# Patient Record
Sex: Female | Born: 1955
Health system: Southern US, Community
[De-identification: ages and names within clinical notes are randomized; demographics above are authoritative.]

## PROBLEM LIST (undated history)

## (undated) DIAGNOSIS — M4712 Other spondylosis with myelopathy, cervical region: Secondary | ICD-10-CM

## (undated) DIAGNOSIS — M170 Bilateral primary osteoarthritis of knee: Secondary | ICD-10-CM

## (undated) DIAGNOSIS — R519 Headache, unspecified: Secondary | ICD-10-CM

## (undated) DIAGNOSIS — G3184 Mild cognitive impairment, so stated: Secondary | ICD-10-CM

## (undated) DIAGNOSIS — R413 Other amnesia: Secondary | ICD-10-CM

## (undated) DIAGNOSIS — R200 Anesthesia of skin: Secondary | ICD-10-CM

## (undated) DIAGNOSIS — G629 Polyneuropathy, unspecified: Secondary | ICD-10-CM

## (undated) DIAGNOSIS — F332 Major depressive disorder, recurrent severe without psychotic features: Secondary | ICD-10-CM

## (undated) DIAGNOSIS — G379 Demyelinating disease of central nervous system, unspecified: Secondary | ICD-10-CM

## (undated) DIAGNOSIS — G959 Disease of spinal cord, unspecified: Secondary | ICD-10-CM

## (undated) DIAGNOSIS — M79601 Pain in right arm: Secondary | ICD-10-CM

## (undated) DIAGNOSIS — R209 Unspecified disturbances of skin sensation: Secondary | ICD-10-CM

## (undated) DIAGNOSIS — I10 Essential (primary) hypertension: Secondary | ICD-10-CM

## (undated) DIAGNOSIS — G8929 Other chronic pain: Secondary | ICD-10-CM

## (undated) DIAGNOSIS — J302 Other seasonal allergic rhinitis: Secondary | ICD-10-CM

## (undated) DIAGNOSIS — C9590 Leukemia, unspecified not having achieved remission: Secondary | ICD-10-CM

## (undated) DIAGNOSIS — I82409 Acute embolism and thrombosis of unspecified deep veins of unspecified lower extremity: Secondary | ICD-10-CM

## (undated) DIAGNOSIS — R51 Headache: Secondary | ICD-10-CM

## (undated) DIAGNOSIS — I2699 Other pulmonary embolism without acute cor pulmonale: Secondary | ICD-10-CM

## (undated) DIAGNOSIS — R42 Dizziness and giddiness: Secondary | ICD-10-CM

## (undated) DIAGNOSIS — M25562 Pain in left knee: Principal | ICD-10-CM

## (undated) DIAGNOSIS — M545 Low back pain, unspecified: Principal | ICD-10-CM

## (undated) DIAGNOSIS — Z1231 Encounter for screening mammogram for malignant neoplasm of breast: Secondary | ICD-10-CM

## (undated) DIAGNOSIS — R0789 Other chest pain: Principal | ICD-10-CM

## (undated) DIAGNOSIS — Z13820 Encounter for screening for osteoporosis: Secondary | ICD-10-CM

## (undated) HISTORY — DX: Other chronic pain: G89.29

## (undated) HISTORY — DX: Headache, unspecified: R51.9

## (undated) HISTORY — PX: OTHER SURGICAL HISTORY: SHX169

## (undated) HISTORY — DX: Other seasonal allergic rhinitis: J30.2

## (undated) HISTORY — DX: Leukemia, unspecified not having achieved remission: C95.90

## (undated) HISTORY — PX: TUBAL LIGATION: SHX77

## (undated) HISTORY — DX: Acute embolism and thrombosis of unspecified deep veins of unspecified lower extremity: I82.409

## (undated) HISTORY — DX: Other pulmonary embolism without acute cor pulmonale: I26.99

## (undated) HISTORY — DX: Headache: R51

## (undated) MED ORDER — FLUOXETINE 40 MG CAP
40 mg | ORAL_CAPSULE | Freq: Every day | ORAL | Status: DC
Start: ? — End: 2013-12-22

## (undated) MED ORDER — FLUOXETINE 40 MG CAP
40 mg | ORAL_CAPSULE | Freq: Every day | ORAL | Status: DC
Start: ? — End: 2012-08-14

## (undated) MED ORDER — LORAZEPAM 0.5 MG TAB
0.5 mg | ORAL_TABLET | Freq: Two times a day (BID) | ORAL | Status: DC | PRN
Start: ? — End: 2013-12-22

## (undated) MED ORDER — AMLODIPINE 5 MG TAB
5 mg | ORAL_TABLET | ORAL | Status: DC
Start: ? — End: 2013-12-22

## (undated) MED ORDER — LORAZEPAM 0.5 MG TAB
0.5 mg | ORAL_TABLET | Freq: Two times a day (BID) | ORAL | Status: DC
Start: ? — End: 2012-08-14

---

## 1998-04-20 ENCOUNTER — Other Ambulatory Visit: Admission: RE | Admit: 1998-04-20 | Discharge: 1998-04-20 | Payer: Self-pay | Admitting: Internal Medicine

## 2000-02-25 ENCOUNTER — Other Ambulatory Visit: Admission: RE | Admit: 2000-02-25 | Discharge: 2000-02-25 | Payer: Self-pay | Admitting: Internal Medicine

## 2000-03-04 ENCOUNTER — Ambulatory Visit (HOSPITAL_COMMUNITY): Admission: RE | Admit: 2000-03-04 | Discharge: 2000-03-04 | Payer: Self-pay | Admitting: Internal Medicine

## 2000-03-04 ENCOUNTER — Encounter: Payer: Self-pay | Admitting: Internal Medicine

## 2000-11-14 ENCOUNTER — Encounter: Payer: Self-pay | Admitting: Internal Medicine

## 2000-11-14 ENCOUNTER — Emergency Department (HOSPITAL_COMMUNITY): Admission: EM | Admit: 2000-11-14 | Discharge: 2000-11-14 | Payer: Self-pay | Admitting: Internal Medicine

## 2000-11-26 ENCOUNTER — Encounter: Payer: Self-pay | Admitting: Internal Medicine

## 2000-11-26 ENCOUNTER — Encounter: Admission: RE | Admit: 2000-11-26 | Discharge: 2000-11-26 | Payer: Self-pay | Admitting: Internal Medicine

## 2001-04-12 ENCOUNTER — Encounter: Payer: Self-pay | Admitting: Emergency Medicine

## 2001-04-12 ENCOUNTER — Emergency Department (HOSPITAL_COMMUNITY): Admission: EM | Admit: 2001-04-12 | Discharge: 2001-04-12 | Payer: Self-pay | Admitting: *Deleted

## 2001-04-21 ENCOUNTER — Ambulatory Visit (HOSPITAL_BASED_OUTPATIENT_CLINIC_OR_DEPARTMENT_OTHER): Admission: RE | Admit: 2001-04-21 | Discharge: 2001-04-21 | Payer: Self-pay | Admitting: Orthopedic Surgery

## 2001-11-06 ENCOUNTER — Emergency Department (HOSPITAL_COMMUNITY): Admission: EM | Admit: 2001-11-06 | Discharge: 2001-11-06 | Payer: Self-pay | Admitting: Emergency Medicine

## 2002-11-24 ENCOUNTER — Emergency Department (HOSPITAL_COMMUNITY): Admission: EM | Admit: 2002-11-24 | Discharge: 2002-11-24 | Payer: Self-pay | Admitting: Emergency Medicine

## 2002-11-24 ENCOUNTER — Encounter: Payer: Self-pay | Admitting: Emergency Medicine

## 2004-04-17 ENCOUNTER — Emergency Department (HOSPITAL_COMMUNITY): Admission: EM | Admit: 2004-04-17 | Discharge: 2004-04-17 | Payer: Self-pay | Admitting: Emergency Medicine

## 2004-05-28 ENCOUNTER — Emergency Department (HOSPITAL_COMMUNITY): Admission: EM | Admit: 2004-05-28 | Discharge: 2004-05-28 | Payer: Self-pay | Admitting: Emergency Medicine

## 2005-03-17 ENCOUNTER — Ambulatory Visit (HOSPITAL_COMMUNITY): Admission: RE | Admit: 2005-03-17 | Discharge: 2005-03-17 | Payer: Self-pay | Admitting: Obstetrics

## 2007-07-03 ENCOUNTER — Emergency Department (HOSPITAL_COMMUNITY): Admission: EM | Admit: 2007-07-03 | Discharge: 2007-07-03 | Payer: Self-pay | Admitting: Emergency Medicine

## 2007-07-04 ENCOUNTER — Inpatient Hospital Stay (HOSPITAL_COMMUNITY): Admission: EM | Admit: 2007-07-04 | Discharge: 2007-07-09 | Payer: Self-pay | Admitting: Emergency Medicine

## 2007-07-07 ENCOUNTER — Ambulatory Visit: Payer: Self-pay | Admitting: Surgery

## 2007-08-04 ENCOUNTER — Emergency Department (HOSPITAL_COMMUNITY): Admission: EM | Admit: 2007-08-04 | Discharge: 2007-08-04 | Payer: Self-pay | Admitting: Emergency Medicine

## 2007-08-05 ENCOUNTER — Ambulatory Visit: Payer: Self-pay | Admitting: Vascular Surgery

## 2007-08-05 ENCOUNTER — Inpatient Hospital Stay (HOSPITAL_COMMUNITY): Admission: EM | Admit: 2007-08-05 | Discharge: 2007-08-06 | Payer: Self-pay | Admitting: Emergency Medicine

## 2007-08-11 ENCOUNTER — Ambulatory Visit: Payer: Self-pay | Admitting: Internal Medicine

## 2007-08-11 LAB — CONVERTED CEMR LAB
ALT: 60 units/L — ABNORMAL HIGH (ref 0–35)
Alkaline Phosphatase: 98 units/L (ref 39–117)
BUN: 9 mg/dL (ref 6–23)
CO2: 24 meq/L (ref 19–32)
Chloride: 107 meq/L (ref 96–112)
Eosinophils Absolute: 0.1 10*3/uL (ref 0.0–0.7)
Glucose, Bld: 92 mg/dL (ref 70–99)
HCT: 39.8 % (ref 36.0–46.0)
Hemoglobin: 12.8 g/dL (ref 12.0–15.0)
Lymphocytes Relative: 36 % (ref 12–46)
Monocytes Absolute: 0.6 10*3/uL (ref 0.2–0.7)
Monocytes Relative: 9 % (ref 3–11)
Potassium: 4.2 meq/L (ref 3.5–5.3)
RBC: 4.25 M/uL (ref 3.87–5.11)
Sodium: 142 meq/L (ref 135–145)
Total Bilirubin: 0.5 mg/dL (ref 0.3–1.2)

## 2007-08-13 ENCOUNTER — Ambulatory Visit: Payer: Self-pay | Admitting: Family Medicine

## 2007-10-13 ENCOUNTER — Ambulatory Visit: Payer: Self-pay | Admitting: Internal Medicine

## 2007-10-13 LAB — CONVERTED CEMR LAB
HDL: 43 mg/dL (ref >39)
LDL Cholesterol: 182 mg/dL — ABNORMAL HIGH (ref 0–99)
Triglycerides: 270 mg/dL — ABNORMAL HIGH (ref <150)

## 2007-10-25 ENCOUNTER — Ambulatory Visit: Payer: Self-pay | Admitting: Internal Medicine

## 2007-10-25 ENCOUNTER — Ambulatory Visit: Payer: Self-pay | Admitting: Family Medicine

## 2007-11-08 ENCOUNTER — Ambulatory Visit: Payer: Self-pay | Admitting: Internal Medicine

## 2007-11-17 ENCOUNTER — Ambulatory Visit: Payer: Self-pay | Admitting: Internal Medicine

## 2007-11-24 ENCOUNTER — Ambulatory Visit: Payer: Self-pay | Admitting: Internal Medicine

## 2007-11-29 ENCOUNTER — Ambulatory Visit: Payer: Self-pay | Admitting: Family Medicine

## 2007-12-06 ENCOUNTER — Ambulatory Visit: Payer: Self-pay | Admitting: Family Medicine

## 2007-12-22 ENCOUNTER — Ambulatory Visit: Payer: Self-pay | Admitting: Family Medicine

## 2007-12-29 ENCOUNTER — Ambulatory Visit: Payer: Self-pay | Admitting: Internal Medicine

## 2008-01-12 ENCOUNTER — Ambulatory Visit: Payer: Self-pay | Admitting: Internal Medicine

## 2008-11-20 ENCOUNTER — Emergency Department (HOSPITAL_COMMUNITY): Admission: EM | Admit: 2008-11-20 | Discharge: 2008-11-20 | Payer: Self-pay | Admitting: Emergency Medicine

## 2009-01-14 ENCOUNTER — Emergency Department (HOSPITAL_COMMUNITY): Admission: EM | Admit: 2009-01-14 | Discharge: 2009-01-14 | Payer: Self-pay | Admitting: Emergency Medicine

## 2009-02-14 ENCOUNTER — Ambulatory Visit (HOSPITAL_COMMUNITY): Admission: RE | Admit: 2009-02-14 | Discharge: 2009-02-14 | Payer: Self-pay | Admitting: Orthopedic Surgery

## 2009-06-26 ENCOUNTER — Ambulatory Visit: Payer: Self-pay | Admitting: Internal Medicine

## 2009-06-26 ENCOUNTER — Encounter (INDEPENDENT_AMBULATORY_CARE_PROVIDER_SITE_OTHER): Payer: Self-pay | Admitting: Adult Health

## 2009-06-26 LAB — CONVERTED CEMR LAB
ALT: 18 units/L (ref 0–35)
Albumin: 4.3 g/dL (ref 3.5–5.2)
Basophils Absolute: 0 10*3/uL (ref 0.0–0.1)
Benzodiazepines.: NEGATIVE
CO2: 25 meq/L (ref 19–32)
Calcium: 10.1 mg/dL (ref 8.4–10.5)
Creatinine,U: 164.2 mg/dL
Eosinophils Absolute: 0.2 10*3/uL (ref 0.0–0.7)
Eosinophils Relative: 2 % (ref 0–5)
LDL Cholesterol: 184 mg/dL — ABNORMAL HIGH (ref 0–99)
Lymphs Abs: 2.5 10*3/uL (ref 0.7–4.0)
Methadone: NEGATIVE
Monocytes Absolute: 0.7 10*3/uL (ref 0.1–1.0)
Neutro Abs: 5.3 10*3/uL (ref 1.7–7.7)
Platelets: 354 10*3/uL (ref 150–400)
RDW: 13.2 % (ref 11.5–15.5)
Sodium: 139 meq/L (ref 135–145)
Total CHOL/HDL Ratio: 7.5
Total Protein: 7.7 g/dL (ref 6.0–8.3)
Triglycerides: 347 mg/dL — ABNORMAL HIGH (ref <150)
VLDL: 69 mg/dL — ABNORMAL HIGH (ref 0–40)
WBC: 8.8 10*3/uL (ref 4.0–10.5)

## 2009-08-27 ENCOUNTER — Encounter (INDEPENDENT_AMBULATORY_CARE_PROVIDER_SITE_OTHER): Payer: Self-pay | Admitting: Adult Health

## 2009-08-27 ENCOUNTER — Ambulatory Visit: Payer: Self-pay | Admitting: Internal Medicine

## 2009-08-27 LAB — CONVERTED CEMR LAB
AST: 14 units/L (ref 0–37)
Alkaline Phosphatase: 121 units/L — ABNORMAL HIGH (ref 39–117)
CO2: 23 meq/L (ref 19–32)
Calcium: 9.9 mg/dL (ref 8.4–10.5)
Sodium: 139 meq/L (ref 135–145)
Total Bilirubin: 0.5 mg/dL (ref 0.3–1.2)
Total CHOL/HDL Ratio: 5.9
Total Protein: 7.4 g/dL (ref 6.0–8.3)

## 2009-09-14 ENCOUNTER — Ambulatory Visit: Payer: Self-pay | Admitting: Internal Medicine

## 2009-09-14 ENCOUNTER — Encounter (INDEPENDENT_AMBULATORY_CARE_PROVIDER_SITE_OTHER): Payer: Self-pay | Admitting: Adult Health

## 2009-10-05 ENCOUNTER — Ambulatory Visit (HOSPITAL_COMMUNITY): Admission: RE | Admit: 2009-10-05 | Discharge: 2009-10-05 | Payer: Self-pay | Admitting: Internal Medicine

## 2009-11-01 ENCOUNTER — Encounter (INDEPENDENT_AMBULATORY_CARE_PROVIDER_SITE_OTHER): Payer: Self-pay | Admitting: Adult Health

## 2009-11-01 ENCOUNTER — Ambulatory Visit: Payer: Self-pay | Admitting: Internal Medicine

## 2009-11-01 LAB — CONVERTED CEMR LAB
ALT: 16 units/L (ref 0–35)
AST: 17 units/L (ref 0–37)
Albumin: 3.9 g/dL (ref 3.5–5.2)
BUN: 11 mg/dL (ref 6–23)
Calcium: 9.4 mg/dL (ref 8.4–10.5)
Cholesterol: 214 mg/dL — ABNORMAL HIGH (ref 0–200)
Glucose, Bld: 109 mg/dL — ABNORMAL HIGH (ref 70–99)
Sodium: 140 meq/L (ref 135–145)
Total CHOL/HDL Ratio: 5.8
Triglycerides: 204 mg/dL — ABNORMAL HIGH (ref <150)

## 2009-11-22 ENCOUNTER — Ambulatory Visit: Payer: Self-pay | Admitting: Internal Medicine

## 2010-02-11 ENCOUNTER — Ambulatory Visit: Payer: Self-pay | Admitting: Family Medicine

## 2010-02-11 ENCOUNTER — Encounter (INDEPENDENT_AMBULATORY_CARE_PROVIDER_SITE_OTHER): Payer: Self-pay | Admitting: Adult Health

## 2010-02-11 LAB — CONVERTED CEMR LAB
ALT: 16 units/L (ref 0–35)
AST: 21 units/L (ref 0–37)
Alkaline Phosphatase: 97 units/L (ref 39–117)
Cholesterol: 175 mg/dL (ref 0–200)
Creatinine, Ser: 1.01 mg/dL (ref 0.40–1.20)
LDL Cholesterol: 108 mg/dL — ABNORMAL HIGH (ref 0–99)
Sodium: 141 meq/L (ref 135–145)
Total CHOL/HDL Ratio: 4.3

## 2010-11-24 ENCOUNTER — Encounter: Payer: Self-pay | Admitting: Internal Medicine

## 2010-12-24 ENCOUNTER — Emergency Department (HOSPITAL_COMMUNITY)
Admission: EM | Admit: 2010-12-24 | Discharge: 2010-12-25 | Disposition: A | Discharge status: Home / Self Care | Payer: Self-pay | Attending: Emergency Medicine | Admitting: Emergency Medicine

## 2010-12-24 ENCOUNTER — Emergency Department (HOSPITAL_COMMUNITY): Payer: Self-pay

## 2010-12-24 DIAGNOSIS — R0609 Other forms of dyspnea: Secondary | ICD-10-CM | POA: Insufficient documentation

## 2010-12-24 DIAGNOSIS — R0602 Shortness of breath: Secondary | ICD-10-CM | POA: Insufficient documentation

## 2010-12-24 DIAGNOSIS — R0989 Other specified symptoms and signs involving the circulatory and respiratory systems: Secondary | ICD-10-CM | POA: Insufficient documentation

## 2010-12-24 DIAGNOSIS — Z79899 Other long term (current) drug therapy: Secondary | ICD-10-CM | POA: Insufficient documentation

## 2010-12-24 DIAGNOSIS — Z86711 Personal history of pulmonary embolism: Secondary | ICD-10-CM | POA: Insufficient documentation

## 2010-12-24 DIAGNOSIS — Z86718 Personal history of other venous thrombosis and embolism: Secondary | ICD-10-CM | POA: Insufficient documentation

## 2010-12-24 DIAGNOSIS — R0789 Other chest pain: Secondary | ICD-10-CM | POA: Insufficient documentation

## 2010-12-24 DIAGNOSIS — Z856 Personal history of leukemia: Secondary | ICD-10-CM | POA: Insufficient documentation

## 2010-12-24 DIAGNOSIS — R6883 Chills (without fever): Secondary | ICD-10-CM | POA: Insufficient documentation

## 2010-12-24 LAB — CK TOTAL AND CKMB (NOT AT ARMC): Total CK: 212 U/L — ABNORMAL HIGH (ref 7–177)

## 2010-12-24 LAB — DIFFERENTIAL
Eosinophils Relative: 1 % (ref 0–5)
Lymphs Abs: 3.8 10*3/uL (ref 0.7–4.0)
Monocytes Absolute: 0.7 10*3/uL (ref 0.1–1.0)
Monocytes Relative: 7 % (ref 3–12)

## 2010-12-24 LAB — CBC
HCT: 39.4 % (ref 36.0–46.0)
MCV: 94.5 fL (ref 78.0–100.0)
RBC: 4.17 MIL/uL (ref 3.87–5.11)
RDW: 12.5 % (ref 11.5–15.5)
WBC: 9.8 10*3/uL (ref 4.0–10.5)

## 2010-12-24 LAB — COMPREHENSIVE METABOLIC PANEL
CO2: 26 mEq/L (ref 19–32)
Chloride: 107 mEq/L (ref 96–112)
GFR calc non Af Amer: >60 mL/min (ref >60)
Glucose, Bld: 128 mg/dL — ABNORMAL HIGH (ref 70–99)
Potassium: 3.5 mEq/L (ref 3.5–5.1)
Sodium: 138 mEq/L (ref 135–145)

## 2010-12-24 LAB — APTT: aPTT: 34 seconds (ref 24–37)

## 2010-12-24 LAB — PROTIME-INR
INR: 0.99 (ref 0.00–1.49)
Prothrombin Time: 13.3 seconds (ref 11.6–15.2)

## 2010-12-24 MED ORDER — IOHEXOL 300 MG/ML  SOLN
100.0000 mL | Freq: Once | INTRAMUSCULAR | Status: AC | PRN
  Administered 2010-12-24: 100 mL via INTRAVENOUS

## 2011-01-08 ENCOUNTER — Institutional Professional Consult (permissible substitution): Payer: Self-pay | Admitting: Emergency Medicine

## 2011-02-10 ENCOUNTER — Encounter: Payer: Self-pay | Admitting: Emergency Medicine

## 2011-02-11 ENCOUNTER — Institutional Professional Consult (permissible substitution): Payer: Self-pay | Admitting: Emergency Medicine

## 2011-03-11 ENCOUNTER — Encounter: Payer: Self-pay | Admitting: Emergency Medicine

## 2011-03-11 ENCOUNTER — Ambulatory Visit (INDEPENDENT_AMBULATORY_CARE_PROVIDER_SITE_OTHER): Payer: Self-pay | Admitting: Emergency Medicine

## 2011-03-11 VITALS — BP 122/72 | HR 73 | Temp 98.1°F | Ht 66.0 in | Wt 175.2 lb

## 2011-03-11 DIAGNOSIS — R0609 Other forms of dyspnea: Secondary | ICD-10-CM

## 2011-03-11 DIAGNOSIS — R0600 Dyspnea, unspecified: Secondary | ICD-10-CM

## 2011-03-11 NOTE — Patient Instructions (Addendum)
We will perform full PFT We will arrange for a sleep study Follow up in 6 weeks to review the results of your testing

## 2011-03-11 NOTE — Assessment & Plan Note (Signed)
Full PFT Set up PSG Follow up to review.  Consider cardiology w/u if PFT unrevealing.  rov in 6 weeks.

## 2011-03-11 NOTE — Progress Notes (Signed)
  Subjective:    Patient ID: Tammy [name], female    DOB: October 17, 1956, 55 y.o.   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 Seen in the ED for dyspnea, chest tightness that occur with exertion or at rest, 12/24/10. CT scan done at that time as below, showed basilar atx, no PE, no other infiltrates. Continues to have chest tightness and SOB. Sometimes relieved by SABA, but not always. She complains of fatigue, daytime napping, poor sleep.    Review of Systems  Constitutional: Positive for unexpected weight change.       Loss of appetite  HENT: Positive for ear pain, sore throat and sneezing.   Respiratory: Positive for cough, chest tightness and shortness of breath.        SOB at rest and with activity  Prod cough  Cardiovascular: Positive for chest pain.       Irregular heartbeats  Gastrointestinal: Positive for abdominal pain.  Musculoskeletal:       Hand / feet swelling Joint stiffness / pain  Skin:       itching  Neurological: Positive for headaches.   Past Medical History  Diagnosis Date  . DVT (deep venous thrombosis)   . Leukemia   . Pulmonary embolism   . Seasonal allergies   . Chronic headaches      Family History  Problem Relation Age of Onset  . Emphysema Father   . Heart disease Sister   . Cancer Sister   . Breast cancer Mother      History   Social History  . Marital Status: Single    Spouse Name: N/A    Number of Children: 4  . Years of Education: N/A   Occupational History  . unemployed    Social History Main Topics  . Smoking status: Current Everyday Smoker -- 0.3 packs/day for 40 years    Types: Cigarettes  . Smokeless tobacco: Not on file  . Alcohol Use: No  . Drug Use: No  . Sexually Active: Not on file   Other Topics Concern  . Not on file   Social History Narrative   [place]     No Known Allergies   No outpatient prescriptions  prior to visit.        Objective:   Physical Exam        Assessment & Plan:

## 2011-03-18 NOTE — H&P (Signed)
[name], [name] NO.:  [id number]   MEDICAL RECORD NO.:  [id number]          PATIENT TYPE:  EMS   LOCATION:  ED                           FACILITY:  [facility]   PHYSICIAN:  Michaelyn Barter, M.D. DATE OF BIRTH:  11-19-1955   DATE OF ADMISSION:  08/05/2007  DATE OF DISCHARGE:                              HISTORY & PHYSICAL   PRIMARY CARE PHYSICIAN:  Lonia Blood, M.D.   CHIEF COMPLAINT:  Right ankle/leg pain.   HISTORY OF PRESENT ILLNESS:  The patient is a 55 year old female with a  past medical history of recently diagnosed pulmonary embolism who was  discharged from the hospital on September 5, after having been treated  from the time frame of August 31, up until July 09, 2007, for  pulmonary emboli.  She indicates that approximately two weeks ago she  stopped taking her Coumadin.  The reason that she gives is somewhat  questionable.  A couple of days ago, she began to develop right distal  leg pain.  She describes the appearance of a knot on the right distal  region of her leg around her ankle.  She states that she called her  primary care doctor and was told that she needed to come to the hospital  yesterday.  She came to the ER, but indicates that she left secondary to  having business to tend to.  Since that time, she has developed pins and  needle-like sensation along the bottom surface of her foot whenever she  stands or walks.  She denies having any shortness of breath, but states  that two days ago she did have some chest pain, but currently she denies  having any chest pain.  There is no nausea, no vomiting, and no fevers  or chills.  She does complain of some left arm pain, but she states that  she has had this since 2008.  According to the patient this has been  worked up in the past, but she is requesting that somebody reevaluate  this.   PAST MEDICAL HISTORY:  1. Left lung pulmonary emboli.  2. Community acquired pneumonia.  3. Urinary  tract infection secondary to Escherichia coli.   HOME MEDICATIONS:  Currently the patient is not taking any medications.  However, she was recently discharged on September 5, with Coumadin 5 mg  p.o. daily and then she was told to follow up with her primary care  doctor.   ALLERGIES:  No known drug allergies.   SOCIAL HISTORY:  The patient does smoke cigarettes, she smokes less than  a pack a day.  She has a daughter whose name is Misty Stanley.  Her phone number  is [phone].  Her son is Will, his number is [phone].   FAMILY HISTORY:  Father had a history of emphysema.  Mother is currently  living and she has a history of stage IV breast cancer.  She also has  hypertension.   REVIEW OF SYSTEMS:  As per HPI.   PHYSICAL EXAMINATION:  GENERAL:  The patient is awake, she is  cooperative, she is in no obvious  respiratory distress.  VITAL SIGNS:  Temperature 98.6, blood pressure 136/90, heart rate 97,  respirations 18, O2 saturation is 95% on room air.  HEENT:  Normocephalic and atraumatic, anicteric.  Extraocular movements  are intact.  Oral mucosa is pink.  Dentures are present within the upper  and lower regions of the patient's mouth.  NECK:  Supple, no JVD, no lymphadenopathy, no thyromegaly.  HEART:  S1 and S2 present.  Heart sounds are somewhat distant.  No  murmurs, gallops, and no rubs.  ABDOMEN:  Flat, soft, nontender, nondistended.  No masses are palpated.  Bowel sounds are present x4 quadrants.  EXTREMITIES:  There is no obvious swelling or edema involving the right  or the left lower extremity.  Likewise, there is no significant  tenderness to palpation of the right lower extremity.  On examination of  the area proximal to the ankle, it is very difficult to appreciate any  significant swelling.  There vaguely does appear to be a slightly raised  or indurated area proximal to the ankle on the medial aspect.  However,  this area is not impressive.  There is no erythema.  There is  no warmth  to touch and there is no pain on palpation.  NEUROLOGY:  The patient is alert and oriented x3.  Cranial nerves II-XII  grossly intact.  MUSCULOSKELETAL:  5/5 upper and lower extremity strength.   LABORATORY DATA:  PT is 13.3, INR 1.0, sodium 136, potassium 3.2,  chloride 102, CO2 24, glucose 164, BUN 6, creatinine 0.98.  CBC; white  blood cell count 6.8, hemoglobin 12.0, hematocrit 35.4, platelets 262.   ASSESSMENT:  1. Right leg/ankle pain. The etiology of this is questionable.  Given      a recent diagnosis of pulmonary embolism, it is important to rule      out the presence of a DVT especially in light of the patient      indicating that she has not been compliant with her Coumadin and      her INR being subtherapeutic.  Therefore, we will check venous      Dopplers of the patient's right leg.  2. Left arm pain.  This appears to be chronic in nature, the etiology      of which is questionable.  The patient has not experienced any      recent trauma per her history.  We will order an x-ray of the      patient's left arm as well as a venous Doppler of the left arm.  3. Recently diagnosed pulmonary embolism.  Again the patient's INR is      currently subtherapeutic secondary to her noncompliance with her      medications.  Heparin has been initiated in the emergency      department.  We will continue heparin and we will start Coumadin.  4. Hypokalemia.  We will supplement this.  5. Gastrointestinal prophylaxis.  We will provide Protonix.      Michaelyn Barter, M.D.  Electronically Signed     OR/MEDQ  D:  08/05/2007  T:  08/05/2007  Job:  [id number]   cc:   Lonia Blood, M.D.

## 2011-03-18 NOTE — Discharge Summary (Signed)
Tammy [name], Tammy [name]                ACCOUNT NO.:  [id number]   MEDICAL RECORD NO.:  [id number]          PATIENT TYPE:  INP   LOCATION:  1407                         FACILITY:  [facility]   PHYSICIAN:  Hind I Elsaid, MD      DATE OF BIRTH:  1956/06/24   DATE OF ADMISSION:  07/04/2007  DATE OF DISCHARGE:  07/09/2007                               DISCHARGE SUMMARY   PRIMARY CARE PHYSICIAN:  Lonia Blood, M.D.   DISCHARGE DIAGNOSES:  1. Left lung pulmonary embolism.  2. Community-acquired pneumonia.  3. Escherichia coli urinary tract infection.   DISCHARGE MEDICATIONS:  1. Coumadin 5 mg p.o. daily to be started tomorrow, and then to follow-      up with Dr. Mikeal Hawthorne on Monday.  2. Nicotine patch 21 mg/24 hour.  3. Avelox 500 mg p.o. daily for 10 days.   CONSULTATIONS:  None.   PROCEDURE:  CT of the chest with IV contrast which showed a pulmonary  embolism in the left lower lobe pulmonary artery, bilateral lower lobe  air space disease suspicious for edema.  Mild cardiomegaly.   HISTORY OF PRESENT ILLNESS:  Please review the history of present  illness provided by [name], D.O.  The patient was mainly admitted  with a history of left shoulder and back pain.  A CT scan also found  that the patient has evidence of pulmonary embolism, bilateral air space  disease.   PROBLEM LIST:  1. Left lower lobe pulmonary embolism.  The patient was admitted and      started on IV heparin and Coumadin.  Education of Coumadin was done      during the hospitalization, the INR was closely followed during the      hospitalization for 2 days, the INR was therapeutic.  The patient      will be discharged on 5 mg Coumadin to be started tomorrow, and to      follow-up with Dr. Mikeal Hawthorne on Monday.  We will arrange Home Health      for drawing the blood.  Also, the patient had a lower extremity      venous blood ultrasound of the lower extremity which was completely      negative.  2. Urinary tract infection  Escherichia coli.  The patient had no      fever.  Blood culture revealed evidence of E. coli.  We will treat      the patient with Avelox and further recommendations to be addressed      as an outpatient.  3. Underlying pneumonia.  The patient was started empirically on      Avelox and will continue with that for another one week.  4. Tobacco abuse.  Nicotine patch was offered and counseling was done      during hospitalization.   DISPOSITION:  The patient is to be discharged home with home care home  nurse to draw the INR.  The patient is to follow with Dr. Mikeal Hawthorne on  Monday, an appointment was given to the patient.  The patient needs  close observation of  the Coumadin, and I think she needs to be on  Coumadin for at least 6 months.  We think the patient is medically  stable for discharge home.      Hind Bosie Helper, MD  Electronically Signed     HIE/MEDQ  D:  07/09/2007  T:  07/09/2007  Job:  [id number]   cc:   Lonia Blood, M.D.
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
## 2011-03-21 NOTE — H&P (Signed)
[place]  Patient:    Tammy [name], Tammy [name]                       MRN: [id number] Adm. Date:  [id number] Attending:  Ronne Binning                         History and Physical  PREOPERATIVE DIAGNOSES:  Intra-articular ______ fracture right wrist.  POSTOPERATIVE DIAGNOSES:  Intra-articular ______ fracture right wrist.  OPERATION:  Arthroscopic reduction and focal pinning, application of AG wrist external fixator, injection of ______ bone substitute right wrist.  SURGEON:  Nicki Reaper, M.D.  ASSISTANT:  RN  ANESTHESIA:  Axillary block.  ANESTHESIOLOGIST:  Bedelia Person, M.D.  HISTORY:  The patient is a 55 year old female who suffered a fall and fracture of her distal radius, intra-articular in nature.  She is admitted for reduction, arthroscopy, pinning, and application of AG wrist track with possible injection of ______ bone substitute.  PROCEDURE:  The patient was brought to the operating room.  An axillary block was carried out without difficulty.  She was prepped and draped using Betadine scrub and solution with the right arm free in a supine position.  The wrist track was first placed.  Incisions were made through stab incision over the base of the second metacarpal carried down through subcutaneous tissue. Neurovascular structures protected.  The insertion of the extensor carpal radialis longus identified.  A ______ pin was then placed through this into the third metacarpal base.  This area was irrigated and closed with interrupted 5-0 nylon sutures.  The guide was then placed.  The second incision was made distally.  The second pin was placed after dissection carried down to the metacarpal shaft protecting neurovascular structures.  The second half ______ pin was placed through the metacarpal of the index only. This was irrigated and the skin closed with interrupted 5-0 nylon sutures. The guide for the distal radius pins were then  placed.  These were marked and two half pins were placed into the radial shaft after dissection carried down to the radius.  Neurovascular structures and radial nerve were identified and protected throughout the procedure.  The two half pins were placed through the two cortices of the radius under image intensification.  The radial nerve was protected throughout.  The wound was irrigated.  Skin closed with interrupted 5-0 nylon sutures.  The fracture was then manipulated, reduced.  X-rays confirmed positioning except that volar tilt was not able to be maintained. The lumen was placed in the arthroscopy tower, 10 pounds of traction applied. The joint plated to the 3-4 portal after wrapping the forearm with Coban.  The scaphoid lunate ligament was intact.  Volar radial ligaments were intact.  The lunate ______ joint and TFCC were intact.  A 4-5 portal and 6-U portal for irrigation were then opened after localization with a 22-gauge needle for the 4-5 portal.  Transverse incision made through the skin only, deepened with a blunt hemostat and blunt trocar used to enter the joint as was the 3-4 portal. Joint was inspected from the ulnar side.  There was just the single transverse coronal fracture across the dorsal palmar aspect of the scaphoid lunate to the side of the distal radius.  This was then cleared with curettes, a shaver, and a freer elevator was used to elevate fragments.  This allowed full mobilization of the two fracture fragments.  A  4-5 K-wire was then passed into the radial styloid.  This traversed over to the lunate facette firmly fixing the two radial fragments while being held in position of a manual pressure and being visualized arthroscopically with no step-off.  An image intensifier was then used to confirm positioning of the pin.  A second pin was then passed across the dorsal aspect of the lunate facette into the shaft of the radius with pressure maintaining volar tilt  which was confirmed in both AP and lateral directions.  The arthroscope was then reintroduced, the joint inspected, the joint found to be well aligned without significant gap or step-off.  The fracture was then entered with an 18-gauge needle just proximal to the ______ tubercle.  Once its position was confirmed there was a significant gap present on the radial fracture fragment to the scaphoid.  A #11 stylet and trocar for the injection of ______ was then introduced through the fracture site just proximal to the ______ tubercle confirmed with both AP and lateral directions.  The ______ was mixed and then injected into the fracture fragment.  This filled the metaphysis of the base of the scaphoid facette with minimal entrance into the lunate facette.  It could no longer be injected.  The trocar was removed.  This was allowed to solidify.  The ulnar styloid fracture fragment was then visualized with image intensification and pinned with a 3-5 K-wire.  This held it in anatomic position.  The joint was then reinspected arthroscopically.  There was found to be a minimal step-off to the lunate facette which was felt to be acceptable in that it was directly at the ulnar margin of the scaphoid facette of the radius.  The instruments were removed.  The AG external fixator was then applied.  Traction removed so only neutralization was maintained.  Sterile compressive dressings and splints were applied.  Patient tolerated the procedure well and was taken to the recovery room for observation in satisfactory condition.  She is discharged home to return to the [facility] in one week on Percocet and Keflex. DD:  04/21/01 TD:  04/21/01 Job: [id number]

## 2011-03-27 ENCOUNTER — Ambulatory Visit (HOSPITAL_BASED_OUTPATIENT_CLINIC_OR_DEPARTMENT_OTHER): Payer: Self-pay

## 2011-04-22 ENCOUNTER — Ambulatory Visit: Payer: Self-pay | Admitting: Emergency Medicine

## 2011-04-27 ENCOUNTER — Ambulatory Visit (HOSPITAL_BASED_OUTPATIENT_CLINIC_OR_DEPARTMENT_OTHER): Payer: Self-pay | Attending: Emergency Medicine

## 2011-04-27 DIAGNOSIS — G4733 Obstructive sleep apnea (adult) (pediatric): Secondary | ICD-10-CM | POA: Insufficient documentation

## 2011-05-03 DIAGNOSIS — G4733 Obstructive sleep apnea (adult) (pediatric): Secondary | ICD-10-CM

## 2011-05-03 NOTE — Procedures (Addendum)
NAMEBELISA, Tammy [name] NO.:  [id number]  MEDICAL RECORD NO.:  [id number]          PATIENT TYPE:  OUT  LOCATION:  SLEEP CENTER                 FACILITY:  [facility]  PHYSICIAN:  Coralyn Helling, MD        DATE OF BIRTH:  05/31/1956  DATE OF STUDY:  04/27/2011                           NOCTURNAL POLYSOMNOGRAM  REFERRING PHYSICIAN:  Leslye Peer, MD  INDICATION FOR STUDY:  [name] is a 55 year old female who has a history of snoring, sleep disruption, and daytime sleepiness.  She is, therefore, referred to the sleep lab for evaluation of hypersomnia with obstructive sleep apnea.  Height is 5 feet 2 inches, weight is 175 pounds, BMI is 32, neck size is 13 inches.  EPWORTH SLEEPINESS SCORE:  Six.  MEDICATIONS:  Ventolin.  SLEEP ARCHITECTURE:  Total recording time was 381 minutes.  Total sleep time was 351 minutes.  Sleep efficiency was 92%.  Sleep latency was 10 minutes.  REM latency was 106 minutes.  The study was notable for the lack of stage III sleep.  The patient slept in both the supine and non- supine positions.  RESPIRATORY DATA:  The average respiratory rate was 17.  Moderate snoring was noted by the technician.  The overall apnea-hypopnea index was 4.  The respiratory disturbance index was 8.  The events were exclusively obstructive in nature.  The REM apnea-hypopnea index was 17.1.  The non-REM apnea-hypopnea index was 0.6.  The supine apnea- hypopnea index was 6.5.  The non-supine apnea-hypopnea index was zero.  OXYGEN DATA:  The baseline oxygenation was 95%.  The oxygen saturation nadir was 87%.  The study was conducted without the use of supplemental oxygen.  CARDIAC DATA:  The average heart rate was 74 and the rhythm strip showed sinus rhythm with occasional PACs.  MOVEMENT-PARASOMNIA:  The periodic limb movement index of zero and the patient had no restroom trips.  IMPRESSIONS-RECOMMENDATIONS:  This study shows evidence for mild obstructive  sleep apnea with a respiratory disturbance index of 8.  Of note is that she had a significant positional and REM effect to her sleep disordered breathing.  In addition to diet, exercise, and weight reduction, additional therapeutic options could include CPAP therapy, oral appliance, or surgical intervention.     Coralyn Helling, MD Diplomat, American Board of Sleep Medicine Electronically Signed    VS/MEDQ  D:  05/02/2011 18:18:20  T:  05/03/2011 05:33:56  Job:  [id number]  cc:   Leslye Peer, MD [address] [place], [place] [address]

## 2011-05-16 ENCOUNTER — Encounter: Payer: Self-pay | Admitting: Emergency Medicine

## 2011-05-21 ENCOUNTER — Ambulatory Visit (INDEPENDENT_AMBULATORY_CARE_PROVIDER_SITE_OTHER): Payer: Self-pay | Admitting: Emergency Medicine

## 2011-05-21 ENCOUNTER — Encounter: Payer: Self-pay | Admitting: Emergency Medicine

## 2011-05-21 DIAGNOSIS — R0600 Dyspnea, unspecified: Secondary | ICD-10-CM

## 2011-05-21 DIAGNOSIS — R0989 Other specified symptoms and signs involving the circulatory and respiratory systems: Secondary | ICD-10-CM

## 2011-05-21 DIAGNOSIS — R0609 Other forms of dyspnea: Secondary | ICD-10-CM

## 2011-05-21 DIAGNOSIS — G4730 Sleep apnea, unspecified: Secondary | ICD-10-CM | POA: Insufficient documentation

## 2011-05-21 LAB — PULMONARY FUNCTION TEST

## 2011-05-21 NOTE — Patient Instructions (Signed)
We will set up methacholine challenge testing at [facility] Continue to work on weight loss Follow up with Dr Delton Coombes in 1 month

## 2011-05-21 NOTE — Progress Notes (Signed)
PFT done today. 

## 2011-05-21 NOTE — Assessment & Plan Note (Signed)
PFT normal AF, mild restriction on volumes. She describes trigger-related SOB, ? Asthma - methacholine challenge - no BD for now

## 2011-05-21 NOTE — Assessment & Plan Note (Signed)
Mild by PSG, AHI 4, RDI 6. Not sure that she would qualify for CPAP. I have encouraged wt loss, consider CPAP at some pt in future

## 2011-05-21 NOTE — Progress Notes (Signed)
  Subjective:    Patient ID: Tammy [name], female    DOB: 11/15/1955, 55 y.o.   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 Seen in the ED for dyspnea, chest tightness that occur with exertion or at rest, 12/24/10. CT scan done at that time as below, showed basilar atx, no PE, no other infiltrates. Continues to have chest tightness and SOB. Sometimes relieved by SABA, but not always. She complains of fatigue, daytime napping, poor sleep.   ROV 05/21/11 -- follows up for dyspnea. Had PFT today, PSG on 04/27/11 = AHI 4, RDI 8, SpO2 low 87%. REM AHI 17.1 and supine AHI 6.5. PFT show normal AF, restricted volumes, decrease DLCO that corrects for Va. She tells me that her breathing is about the same, she tells me that certain smells, fumes can set off SOB, wheeze.         Objective:   Physical Exam Gen: Pleasant, obese, in no distress,  normal affect  ENT: No lesions,  mouth clear,  oropharynx clear, no postnasal drip  Neck: No JVD, no TMG, no carotid bruits  Lungs: No use of accessory muscles, no dullness to percussion, clear without rales or rhonchi  Cardiovascular: RRR, heart sounds normal, no murmur or gallops, no peripheral edema  Musculoskeletal: No deformities, no cyanosis or clubbing  Neuro: alert, non focal  Skin: Warm, no lesions or rashes   PULMONARY FUNCTON TEST 05/21/2011  FVC 2.65  FEV1 2.21  FEV1/FVC 83.4  FVC  % Predicted 92  FEV % Predicted 102  FeF 25-75 2.55  FeF 25-75 % Predicted 2.62    Assessment & Plan:

## 2011-05-26 ENCOUNTER — Encounter: Payer: Self-pay | Admitting: Emergency Medicine

## 2011-06-11 ENCOUNTER — Ambulatory Visit (HOSPITAL_COMMUNITY)
Admission: RE | Admit: 2011-06-11 | Discharge: 2011-06-11 | Disposition: A | Discharge status: Home / Self Care | Payer: Self-pay | Source: Ambulatory Visit | Attending: Emergency Medicine | Admitting: Emergency Medicine

## 2011-06-11 DIAGNOSIS — R05 Cough: Secondary | ICD-10-CM | POA: Insufficient documentation

## 2011-06-11 DIAGNOSIS — R059 Cough, unspecified: Secondary | ICD-10-CM | POA: Insufficient documentation

## 2011-06-24 ENCOUNTER — Ambulatory Visit (INDEPENDENT_AMBULATORY_CARE_PROVIDER_SITE_OTHER): Payer: Self-pay | Admitting: Emergency Medicine

## 2011-06-24 ENCOUNTER — Encounter: Payer: Self-pay | Admitting: Emergency Medicine

## 2011-06-24 DIAGNOSIS — G4730 Sleep apnea, unspecified: Secondary | ICD-10-CM

## 2011-06-24 DIAGNOSIS — R0600 Dyspnea, unspecified: Secondary | ICD-10-CM

## 2011-06-24 DIAGNOSIS — R0609 Other forms of dyspnea: Secondary | ICD-10-CM

## 2011-06-24 MED ORDER — OMEPRAZOLE 20 MG PO CPDR: 20.0000 mg | DELAYED_RELEASE_CAPSULE | Freq: Every day | ORAL | Status: DC

## 2011-06-24 NOTE — Patient Instructions (Signed)
Your breathing testing does not show asthma We will not start CPAP at this time for your mild sleep apnea Start omeprazole 20mg  once a day (1 hour before eating) You need to stop smoking. Please call our office if we can help you stop.  Follow up with Dr Delton Coombes if your cough or breathing change in any way.

## 2011-06-24 NOTE — Progress Notes (Signed)
  Subjective:    Patient ID: Tammy [name], female    DOB: [birth year], 55 y.o.   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 Seen in the ED for dyspnea, chest tightness that occur with exertion or at rest, 12/24/10. CT scan done at that time as below, showed basilar atx, no PE, no other infiltrates. Continues to have chest tightness and SOB. Sometimes relieved by SABA, but not always. She complains of fatigue, daytime napping, poor sleep.   ROV 05/21/11 -- follows up for dyspnea. Had PFT today, PSG on 04/27/11 = AHI 4, RDI 8, SpO2 low 87%. REM AHI 17.1 and supine AHI 6.5. PFT show normal AF, restricted volumes, decrease DLCO that corrects for Va. She tells me that her breathing is about the same, she tells me that certain smells, fumes can set off SOB, wheeze.   ROV 06/23/11 -- 55 yo woman, f/u for SOB and for poor sleep. We performed methacholine 06/11/11: shows normal response to methacholine, no evidence asthma. She continues to wake up in the middle of the night, has often been awakened by cough. He problem is staying asleep, not falling asleep. Sometimes associated with some chest discomfort, ? Heartburn. She remains sensitive to dust, fumes, etc - characterized by nose and throat congestion, SOB.    Objective:   Physical Exam Gen: Pleasant, obese, in no distress,  normal affect  ENT: No lesions,  mouth clear,  oropharynx clear, no postnasal drip  Neck: No JVD, no TMG, no carotid bruits  Lungs: No use of accessory muscles, no dullness to percussion, clear without rales or rhonchi  Cardiovascular: RRR, heart sounds normal, no murmur or gallops, no peripheral edema  Musculoskeletal: No deformities, no cyanosis or clubbing  Neuro: alert, non focal  Skin: Warm, no lesions or rashes   PULMONARY FUNCTON TEST 05/21/2011  FVC 2.65  FEV1 2.21  FEV1/FVC 83.4  FVC  % Predicted 92  FEV % Predicted 102  FeF 25-75 2.55  FeF 25-75 %  Predicted 2.62    Assessment & Plan:  Dyspnea Methacholine normal. I suspect that her sx are related to allergies, nasal and throat congestion. We discussed avoidance of triggers and possibly allergy regimen in the future.   Sleep apnea OSA is mild. Her biggest complaint is being waked by cough, I believe from GERD - will try empiric omeprazole

## 2011-06-24 NOTE — Assessment & Plan Note (Signed)
Methacholine normal. I suspect that her sx are related to allergies, nasal and throat congestion. We discussed avoidance of triggers and possibly allergy regimen in the future.

## 2011-06-24 NOTE — Assessment & Plan Note (Signed)
OSA is mild. Her biggest complaint is being waked by cough, I believe from GERD - will try empiric omeprazole

## 2011-08-14 LAB — BASIC METABOLIC PANEL
Calcium: 9.3
Calcium: 9.7
Chloride: 102
Chloride: 104
Creatinine, Ser: 0.98
GFR calc Af Amer: >60
GFR calc Af Amer: >60
GFR calc Af Amer: >60
GFR calc non Af Amer: 58 — ABNORMAL LOW
GFR calc non Af Amer: 59 — ABNORMAL LOW
GFR calc non Af Amer: >60
Glucose, Bld: 94
Potassium: 4.1
Sodium: 140

## 2011-08-14 LAB — DIFFERENTIAL
Basophils Absolute: 0
Lymphocytes Relative: 27
Lymphocytes Relative: 30
Lymphs Abs: 2
Monocytes Absolute: 0.6
Monocytes Relative: 7
Neutro Abs: 4.2
Neutro Abs: 4.5
Neutrophils Relative %: 61

## 2011-08-14 LAB — PROTIME-INR
INR: 1
Prothrombin Time: 13.6
Prothrombin Time: 14.5

## 2011-08-14 LAB — APTT
aPTT: 29
aPTT: 50 — ABNORMAL HIGH

## 2011-08-14 LAB — CBC
HCT: 37.2
Hemoglobin: 12.4
MCV: 91.9
RBC: 3.89
RBC: 3.98
RBC: 4.05
RDW: 13.4
WBC: 6.7
WBC: 6.8

## 2011-08-15 LAB — DIFFERENTIAL
Basophils Absolute: 0.1
Basophils Relative: 0
Basophils Relative: 1
Eosinophils Absolute: 0.1
Eosinophils Relative: 1
Monocytes Absolute: 0.6
Monocytes Relative: 7
Neutro Abs: 7.3
Neutrophils Relative %: 73

## 2011-08-15 LAB — BASIC METABOLIC PANEL
BUN: 7
CO2: 27
CO2: 30
CO2: 26
Calcium: 9.4
Calcium: 9.4
Chloride: 103
Creatinine, Ser: 1.08
Creatinine, Ser: 1.06
GFR calc Af Amer: >60
GFR calc Af Amer: >60
GFR calc non Af Amer: >60
Glucose, Bld: 100 — ABNORMAL HIGH
Glucose, Bld: 98
Glucose, Bld: 97
Potassium: 4.1
Potassium: 4.2
Sodium: 138

## 2011-08-15 LAB — CBC
HCT: 34.5 — ABNORMAL LOW
HCT: 34.6 — ABNORMAL LOW
HCT: 35.6 — ABNORMAL LOW
HCT: 38.4
Hemoglobin: 11.6 — ABNORMAL LOW
Hemoglobin: 11.9 — ABNORMAL LOW
Hemoglobin: 13.2
MCHC: 33.5
MCHC: 33.7
MCHC: 34
MCHC: 34.1
MCHC: 34.4
MCHC: 34
MCHC: 34.4
MCV: 90.3
MCV: 91.1
MCV: 91.4
MCV: 90.6
Platelets: 271
Platelets: 284
Platelets: 284
Platelets: 330
RBC: 3.83 — ABNORMAL LOW
RBC: 3.91
RBC: 4.25
RDW: 12.8
RDW: 13.1
RDW: 13.2
RDW: 13.5
RDW: 13.6
WBC: 9.2
WBC: 10.1

## 2011-08-15 LAB — URINE MICROSCOPIC-ADD ON

## 2011-08-15 LAB — URINALYSIS, ROUTINE W REFLEX MICROSCOPIC
Bilirubin Urine: NEGATIVE
Bilirubin Urine: NEGATIVE
Glucose, UA: NEGATIVE
Ketones, ur: NEGATIVE
Ketones, ur: NEGATIVE
Leukocytes, UA: NEGATIVE
Nitrite: NEGATIVE
Nitrite: POSITIVE — AB
Protein, ur: NEGATIVE
Specific Gravity, Urine: 1.012
Urobilinogen, UA: 0.2
Urobilinogen, UA: 1
pH: 5.5
pH: 6

## 2011-08-15 LAB — HEPATIC FUNCTION PANEL
Albumin: 2.9 — ABNORMAL LOW
Alkaline Phosphatase: 77
Bilirubin, Direct: <0.1
Total Bilirubin: 0.8

## 2011-08-15 LAB — LIPID PANEL
Cholesterol: 239 — ABNORMAL HIGH
HDL: 33 — ABNORMAL LOW
LDL Cholesterol: 176 — ABNORMAL HIGH
Total CHOL/HDL Ratio: 7.2
Triglycerides: 152 — ABNORMAL HIGH
VLDL: 30

## 2011-08-15 LAB — PROTIME-INR
INR: 1.1
INR: 1.8 — ABNORMAL HIGH
INR: 1
Prothrombin Time: 14.4
Prothrombin Time: 21.4 — ABNORMAL HIGH
Prothrombin Time: 13.7

## 2011-08-15 LAB — HEPARIN LEVEL (UNFRACTIONATED)
Heparin Unfractionated: 0.25 — ABNORMAL LOW
Heparin Unfractionated: 0.37
Heparin Unfractionated: 0.5
Heparin Unfractionated: 0.73 — ABNORMAL HIGH
Heparin Unfractionated: 0.93 — ABNORMAL HIGH

## 2011-08-15 LAB — POCT CARDIAC MARKERS
Myoglobin, poc: 78.1
Operator id: 4534
Troponin i, poc: <0.05

## 2011-08-15 LAB — HEMOGLOBIN A1C: Mean Plasma Glucose: 126

## 2011-08-15 LAB — D-DIMER, QUANTITATIVE: D-Dimer, Quant: 1.57 — ABNORMAL HIGH

## 2011-12-23 ENCOUNTER — Encounter

## 2011-12-24 LAB — CBC WITH AUTOMATED DIFF
ABS. BASOPHILS: 0 10*3/uL (ref 0.0–0.1)
ABS. EOSINOPHILS: 0.2 10*3/uL (ref 0.0–0.4)
ABS. LYMPHOCYTES: 3 10*3/uL (ref 0.8–3.5)
ABS. MONOCYTES: 0.5 10*3/uL (ref 0.0–1.0)
ABS. NEUTROPHILS: 6.7 10*3/uL (ref 1.8–8.0)
BASOPHILS: 0 % (ref 0–1)
EOSINOPHILS: 2 % (ref 0–7)
HCT: 46.3 % (ref 35.0–47.0)
HGB: 16 g/dL (ref 11.5–16.0)
LYMPHOCYTES: 29 % (ref 12–49)
MCH: 32.1 PG (ref 26.0–34.0)
MCHC: 34.6 g/dL (ref 30.0–36.5)
MCV: 93 FL (ref 80.0–99.0)
MONOCYTES: 5 % (ref 5–13)
NEUTROPHILS: 64 % (ref 32–75)
PLATELET: 355 10*3/uL (ref 150–400)
RBC: 4.98 M/uL (ref 3.80–5.20)
RDW: 13.5 % (ref 11.5–14.5)
WBC: 10.4 10*3/uL (ref 3.6–11.0)

## 2011-12-24 LAB — METABOLIC PANEL, COMPREHENSIVE
A-G Ratio: 1 — ABNORMAL LOW (ref 1.1–2.2)
ALT (SGPT): 61 U/L (ref 12–78)
AST (SGOT): 40 U/L — ABNORMAL HIGH (ref 15–37)
Albumin: 4.2 g/dL (ref 3.5–5.0)
Alk. phosphatase: 94 U/L (ref 50–136)
Anion gap: 10 mmol/L (ref 5–15)
BUN/Creatinine ratio: 24 — ABNORMAL HIGH (ref 12–20)
BUN: 12 MG/DL (ref 6–20)
Bilirubin, total: 0.4 MG/DL (ref 0.2–1.0)
CO2: 25 MMOL/L (ref 21–32)
Calcium: 9.5 MG/DL (ref 8.5–10.1)
Chloride: 106 MMOL/L (ref 97–108)
Creatinine: 0.5 MG/DL (ref 0.45–1.15)
GFR est AA: >60 mL/min/{1.73_m2} (ref >60)
GFR est non-AA: >60 mL/min/{1.73_m2} (ref >60)
Globulin: 4.3 g/dL — ABNORMAL HIGH (ref 2.0–4.0)
Glucose: 81 MG/DL (ref 65–100)
Potassium: 3.9 MMOL/L (ref 3.5–5.1)
Protein, total: 8.5 g/dL — ABNORMAL HIGH (ref 6.4–8.2)
Sodium: 141 MMOL/L (ref 136–145)

## 2011-12-24 LAB — CK W/ REFLX CKMB: CK: 123 U/L (ref 26–192)

## 2011-12-24 LAB — TROPONIN I
Troponin-I, Qt.: <0.04 ng/mL (ref <0.05)
Troponin-I, Qt.: <0.04 ng/mL (ref <0.05)

## 2011-12-24 MED ORDER — GADOPENTETATE DIMEGLUMINE 7.5 MMOL/15 ML (469.01 MG/ML) IV
7.5 mmol/15 mL (469.01 mg/mL) | Freq: Once | INTRAVENOUS | Status: AC
Start: 2011-12-24 — End: 2011-12-24
  Administered 2011-12-24: 18:00:00 via INTRAVENOUS

## 2011-12-24 MED ADMIN — ketorolac (TORADOL) injection 30 mg: INTRAVENOUS | NDC 00409379501

## 2011-12-24 MED ADMIN — morphine injection 4 mg: INTRAVENOUS | @ 20:00:00 | NDC 00409176230

## 2011-12-24 MED FILL — KETOROLAC TROMETHAMINE 30 MG/ML INJECTION: 30 mg/mL (1 mL) | INTRAMUSCULAR | Qty: 1

## 2011-12-24 MED FILL — MAGNEVIST 7.5 MMOL/15 ML (469.01 MG/ML) INTRAVENOUS SOLUTION: 7.5 mmol/15 mL (469.01 mg/mL) | INTRAVENOUS | Qty: 15

## 2011-12-24 MED FILL — MORPHINE 2 MG/ML INJECTION: 2 mg/mL | INTRAMUSCULAR | Qty: 2

## 2011-12-24 NOTE — ED Notes (Signed)
Triage: Pt in after having MRI, MRI brought Pt to ER. Per Pt Pt was brought to ER because BP "was too high." BP at this time is 156/97. Pt with HA at this time. Pt has pain across shoulders. Pt denies nausea/sweating.

## 2011-12-24 NOTE — ED Provider Notes (Signed)
Patient is a 56 y.o. female presenting with hypertension.   Hypertension          55y F here with HA, chest pain. She has had a diffuse HA for the past 2 months. Her PMD set up for her to have an outpatient MRI today. Immediately after getting the IV contrast she developed a "fluttering" pain in her chest that resolved when the contrast was finished. She says the pain felt like a pressure sensation. No associated nausea, vomiting or diaphoresis. She's never had pain like this in the past. This occurred about 3 hours ago. At this time she has no chest pain. No trouble breathing. No rash. She still has a HA which has been unchanged in intensity for the past few weeks. No focal numbness/weakness. No change in speech. No change in mental status per family. When she had the episode of chest pain at MRI today, her BP was found to be elevated. She has no prior hx of HTN. In triage in the ED her BP had improved without any specific intervention.    No past medical history on file.     Past Surgical History   Procedure Date   ??? Hx gyn      tubal ligation         No family history on file.     History     Social History   ??? Marital Status: LEGALLY SEPARATED     Spouse Name: N/A     Number of Children: N/A   ??? Years of Education: N/A     Occupational History   ??? Not on file.     Social History Main Topics   ??? Smoking status: Not on file   ??? Smokeless tobacco: Not on file   ??? Alcohol Use:    ??? Drug Use:    ??? Sexually Active:      Other Topics Concern   ??? Not on file     Social History Narrative   ??? No narrative on file                  ALLERGIES: Review of patient's allergies indicates no known allergies.      Review of Systems  Review of Systems   Constitutional: (-) weight loss.   HEENT: (-) stiff neck   Eyes: (-) discharge.   Respiratory: (-) for cough.    Cardiovascular: (+) for chest pain.   Gastrointestinal: (-) blood in stool.   Genitourinary: (-) hematuria.  Musculoskeletal: (-)  myalgias.   Neurological: (-) seizure.    Skin: (-) petechiae  Lymph/Immunologic: (-) enlarged lymph nodes  All other systems reviewed and are negative.          There were no vitals filed for this visit.         Physical Exam Nursing note and vitals reviewed.  Constitutional: oriented to person, place, and time. appears well-developed and well-nourished. No distress.  Head: Normocephalic and atraumatic. Sclera anicteric  Nose: No rhinorrhea  Mouth/Throat: Oropharynx is clear and moist. Pharynx normal  Eyes: Conjunctivae are normal. Pupils are equal, round, and reactive to light. Right eye exhibits no discharge. Left eye exhibits no discharge.  Neck: Painless normal range of motion. Neck supple. No LAD.  Cardiovascular: Normal rate, regular rhythm, normal heart sounds and intact distal pulses.  Exam reveals no gallop and no friction rub.  No murmur heard.  Pulmonary/Chest:  No respiratory distress. No wheezes. No rales. No rhonchi. No increased  work of breathing. No accessory muscle use. Good air exchange throughout.  Abdominal: soft, non-tender, no rebound or guarding. No hepatosplenomegaly. Normal bowel sounds throughout.  Back: no tenderness to palpation, no deformities, no CVA tenderness  Extremities/Musculoskeletal: Normal range of motion. no tenderness. No edema. Distal extremities are neurovasc intact.  Lymphadenopathy:   No adenopathy.   Neurological:  CN II-XII intact, 5/5 strength throughout, nl sensation throughout, nl cerebellar function, nl speech/fluency, nl gait/station, no pronator drift. Normal mental status.  Skin: Skin is warm and dry. No rash noted. No pallor.         MDM 82y F here with chest pain that resolved prior to arrival to the ED. Will check labs, ecg and give medicine for HA and reeval. Low suspicion for ACS given pain was related only to administration of IV contrast for MRI.    Procedures    ED EKG interpretation:  Rhythm: normal sinus rhythm; and regular . Rate (approx.): 70; Axis: normal; P wave: normal; QRS interval:  normal ; ST/T wave: T wave inverted V4-V6;  This EKG was interpreted by Lemmie Evens, MD,ED Provider.    7:11 PM  No recurrence of pain in the ED. 2nd troponin was negative. BP improved after pain control. i spoke with neuro (Dr. Michaelle Birks) - ok for dc with follow-up for headaches. Will also give follow-up information for cardiology. Pt understands and agrees with the plan.

## 2011-12-24 NOTE — Progress Notes (Signed)
Paitent had a mri of her brain. She was given contrast by a butterfly in her left antecubital. Contrast was given slowly, and after contrast was administered, she complained of chest pain with no radiation or nausea. States that she had never had chest pain before. BP  Was172/109, pulse rate 81 and o2 saturation, 100%. We allowed her to sit up and states chest pain almost gone. Repeat bp was 168/103. Dr. Darcella Cheshire notified and came over to talk to her. He wants her to go to ED. PBRoss RN

## 2011-12-25 LAB — EKG, 12 LEAD, INITIAL
Atrial Rate: 70 {beats}/min
Calculated P Axis: 67 degrees
Calculated R Axis: 22 degrees
Calculated T Axis: -7 degrees
Diagnosis: NORMAL
P-R Interval: 140 ms
Q-T Interval: 364 ms
QRS Duration: 80 ms
QTC Calculation (Bezet): 393 ms
Ventricular Rate: 70 {beats}/min

## 2011-12-25 MED ORDER — ACETAMINOPHEN-CODEINE 300 MG-30 MG TAB
300-30 mg | ORAL_TABLET | ORAL | Status: DC | PRN
Start: 2011-12-25 — End: 2012-02-09

## 2011-12-25 MED ORDER — IBUPROFEN 800 MG TAB
800 mg | ORAL_TABLET | Freq: Three times a day (TID) | ORAL | Status: AC | PRN
Start: 2011-12-25 — End: 2011-12-31

## 2012-02-09 LAB — D-DIMER, QUANTITATIVE: D-Dimer, Quant: 0.23 mg/L FEU (ref 0.00–0.65)

## 2012-02-09 LAB — CBC WITH AUTOMATED DIFF
ABS. BASOPHILS: 0 10*3/uL (ref 0.0–0.1)
ABS. EOSINOPHILS: 0 10*3/uL (ref 0.0–0.4)
ABS. LYMPHOCYTES: 1.7 10*3/uL (ref 0.8–3.5)
ABS. MONOCYTES: 0.2 10*3/uL (ref 0.0–1.0)
ABS. NEUTROPHILS: 3.4 10*3/uL (ref 1.8–8.0)
BASOPHILS: 0 % (ref 0–1)
EOSINOPHILS: 1 % (ref 0–7)
HCT: 43.9 % (ref 35.0–47.0)
HGB: 14.9 g/dL (ref 11.5–16.0)
LYMPHOCYTES: 32 % (ref 12–49)
MCH: 32.2 PG (ref 26.0–34.0)
MCHC: 33.9 g/dL (ref 30.0–36.5)
MCV: 94.8 FL (ref 80.0–99.0)
MONOCYTES: 4 % — ABNORMAL LOW (ref 5–13)
NEUTROPHILS: 63 % (ref 32–75)
PLATELET: 304 10*3/uL (ref 150–400)
RBC: 4.63 M/uL (ref 3.80–5.20)
RDW: 13.8 % (ref 11.5–14.5)
WBC: 5.4 10*3/uL (ref 3.6–11.0)

## 2012-02-09 LAB — CK W/ CKMB & INDEX
CK - MB: 0.8 NG/ML (ref 0.5–3.6)
CK - MB: <0.5 NG/ML — ABNORMAL LOW (ref 0.5–3.6)
CK-MB Index: 0.4 (ref 0–2.5)
CK: 190 U/L (ref 26–192)
CK: 388 U/L — ABNORMAL HIGH (ref 26–192)

## 2012-02-09 LAB — METABOLIC PANEL, COMPREHENSIVE
A-G Ratio: 1 — ABNORMAL LOW (ref 1.1–2.2)
ALT (SGPT): 49 U/L (ref 12–78)
AST (SGOT): 22 U/L (ref 15–37)
Albumin: 3.9 g/dL (ref 3.5–5.0)
Alk. phosphatase: 85 U/L (ref 50–136)
Anion gap: 7 mmol/L (ref 5–15)
BUN/Creatinine ratio: 31 — ABNORMAL HIGH (ref 12–20)
BUN: 16 MG/DL (ref 6–20)
Bilirubin, total: 0.3 MG/DL (ref 0.2–1.0)
CO2: 28 MMOL/L (ref 21–32)
Calcium: 9.5 MG/DL (ref 8.5–10.1)
Chloride: 106 MMOL/L (ref 97–108)
Creatinine: 0.52 MG/DL (ref 0.45–1.15)
GFR est AA: >60 mL/min/{1.73_m2} (ref >60)
GFR est non-AA: >60 mL/min/{1.73_m2} (ref >60)
Globulin: 3.9 g/dL (ref 2.0–4.0)
Glucose: 91 MG/DL (ref 65–100)
Potassium: 3.8 MMOL/L (ref 3.5–5.1)
Protein, total: 7.8 g/dL (ref 6.4–8.2)
Sodium: 141 MMOL/L (ref 136–145)

## 2012-02-09 LAB — EKG, 12 LEAD, INITIAL
Atrial Rate: 68 {beats}/min
Calculated P Axis: 63 degrees
Calculated R Axis: 30 degrees
Calculated T Axis: 49 degrees
P-R Interval: 126 ms
Q-T Interval: 366 ms
QRS Duration: 72 ms
QTC Calculation (Bezet): 389 ms
Ventricular Rate: 68 {beats}/min

## 2012-02-09 LAB — TROPONIN I
Troponin-I, Qt.: <0.04 ng/mL (ref <0.05)
Troponin-I, Qt.: <0.04 ng/mL (ref <0.05)

## 2012-02-09 LAB — D DIMER: D-dimer: 0.23 mg/L FEU (ref 0.00–0.65)

## 2012-02-09 MED ORDER — SALINE PERIPHERAL FLUSH PRN
INTRAMUSCULAR | Status: DC | PRN
Start: 2012-02-09 — End: 2012-02-10

## 2012-02-09 MED ORDER — SALINE PERIPHERAL FLUSH Q8H
Freq: Three times a day (TID) | INTRAMUSCULAR | Status: DC
Start: 2012-02-09 — End: 2012-02-10
  Administered 2012-02-09 – 2012-02-10 (×3)

## 2012-02-09 MED ADMIN — butalbital-acetaminophen-caffeine (FIORICET, ESGIC) per tablet 1 Tab: ORAL | @ 16:00:00 | NDC 68084039611

## 2012-02-09 MED ADMIN — ketorolac (TORADOL) injection 30 mg: INTRAVENOUS | @ 14:00:00 | NDC 00409379301

## 2012-02-09 MED ADMIN — butalbital-acetaminophen-caffeine (FIORICET, ESGIC) per tablet 1 Tab: ORAL | @ 20:00:00 | NDC 68084039611

## 2012-02-09 MED ADMIN — enoxaparin (LOVENOX) injection 40 mg: SUBCUTANEOUS | @ 16:00:00 | NDC 00075062040

## 2012-02-09 MED FILL — BUTALBITAL-ACETAMINOPHEN-CAFFEINE 50 MG-325 MG-40 MG TAB: 50-325-40 mg | ORAL | Qty: 1

## 2012-02-09 MED FILL — BD PRE-FILLED 0.9% SALINE WITH BLUNT PLASTIC CANNULA INJECTION SYRINGE: INTRAMUSCULAR | Qty: 10

## 2012-02-09 MED FILL — LOVENOX 40 MG/0.4 ML SUBCUTANEOUS SYRINGE: 40 mg/0.4 mL | SUBCUTANEOUS | Qty: 0.4

## 2012-02-09 MED FILL — KETOROLAC TROMETHAMINE 15 MG/ML INJECTION: 15 mg/mL | INTRAMUSCULAR | Qty: 2

## 2012-02-09 NOTE — Progress Notes (Signed)
TRANSFER - IN REPORT:    Verbal report received from Heather, RN(name) on Vanessa [name]  being received from ED(unit) for routine progression of care      Report consisted of patient???s Situation, Background, Assessment and   Recommendations(SBAR).     Information from the following report(s) SBAR, ED Summary and Recent Results was reviewed with the receiving nurse.    Opportunity for questions and clarification was provided.      Assessment completed upon patient???s arrival to unit and care assumed.

## 2012-02-09 NOTE — Consults (Signed)
Neurology Consult dictated   Studies reviewed and patient examined     Impression:  1.muscle contraction headaches    2.history of depression    3. Low back pain    4. Abnormal MRI    Suggestion:  1.consider muscle relaxants    2.spine x-rays as well as SI joints    3. Fasting lipids if not done recently    4. Blood pressure control     Coralie Keens, MD 02/09/2012 4:22 PM

## 2012-02-09 NOTE — Consults (Signed)
Name:       Vanessa [name], Vanessa [name]          Admitted:          02/09/2012                                         DOB:               10-Oct-1956  Account #:  [id number]               Age:               56  Consultant: Coralie Keens, MD         Location                                CONSULTATION REPORT    DATE OF CONSULTATION           02/09/2012      IMPRESSIONS:  1. Muscle contraction headaches.  2. History of depression.  3. Low back pain.  4. Abnormal MRI.    SUGGESTIONS:  1. Consider muscle relaxants.  2. Spine x-rays, as well as SI joints.  3. Fasting lipids if not done recently.  4. Blood pressure control.    HISTORY: The patient seen, examined, chart reviewed. This 56 year old,  right-handed female is seen in consultation at the request of Dr. Oneida Alar  for headaches and an abnormal MRI scan. The patient states that she had the  onset of intermittent headaches in early February. She describes these as 2  to 3 times per week, bifrontal, occipital, squeezing, and without nausea,  vomiting, or photophobia. The patient denies a history of previous  headaches. She notes that her headaches improve with Aleve. The patient had  an MRI scan done in February, which I have personally reviewed, and  demonstrates white matter disease. She was actually supposed to see me in  my office in mid May.    The patient was admitted to the hospital today after complaints of  left-sided chest pain. Her cardiac enzymes were negative. In addition to  the above, the patient notes intermittent burning sensations between her  shoulder blades, as well as low back pain. She notes generalized weakness.    PAST MEDICAL HISTORY: Significant for depression, bilateral tubal ligation,  and otherwise fairly unremarkable. The patient most recently has had some  elevated blood pressures and because of such, was started on blood pressure  medicine that she is not taking.    ALLERGIES: NONE.    MEDICATIONS: Are presently Prozac.    FAMILY HISTORY:  Negative for headaches. The patient's mother is quite sick  at the present time. She is undergoing dialysis with chronic renal  disease.    SOCIAL HISTORY: The patient has 3 children. She is legally separated. She  does not smoke or drink alcohol to excess.    REVIEW OF SYSTEMS: Comprehensive review of systems is as noted in the  history of present illness. In addition, a full 12-system review of systems  demonstrates episodic chest pain, depression, difficulty with sleep, low  back pain.    PHYSICAL EXAMINATION  GENERAL: Reveals the patient to be somewhat bluntly affected.  VITAL SIGNS: Blood pressure is now 137/76. However, on admission to the  hospital it was 152/105. Pulse is 70  and regular.  HEENT: The patient is normocephalic with pupils equally round, react to  light. Extraocular movements are intact. Fundi are benign.  NECK: Supple. No carotid or cranial bruits are present.  CARDIAC: Reveals regular rate and rhythm.  NEUROLOGIC: The patient is fluent and articulate. She repeats names, knows  left-right, and follows commands. Cranial nerves 2-12 are intact. Deep  tendon reflexes symmetric. Plantar reflexes are flexor. Individual muscle  strength testing demonstrates normal strength, tone, and bulk.  Finger-nose-finger, heel-shin is normal.  Straight leg raising is positive. The patient has tenderness to the right  SI joint. She also has tenderness in both trapezii and suboccipital  region.    LABORATORY: The patient's MRI scan was personally reviewed by me, and it  demonstrates white matter disease.    In addition, blood work demonstrates a white count of 5.4. Coagulation  panel demonstrates a D-dimer of 0.23, which is normal. Chemistry is  unremarkable. The only abnormality is an initial CPK of 190 and then 388,  with a normal MB, and normal troponins.    DISCUSSION: This patient presents with what sounds like muscle contraction  headaches, as well as trapezial pain. Suspect that they are related.  Whether  they are related to increasing stress with depression related to  the patient's mother's situation is unclear. Certainly, that may well be  the case. In addition, the patient's low back pain sounds musculoskeletal,  and I think it unlikely represents a disk, but certainly SI joint disease  needs to likewise be ruled out, as well as lumbosacral spine disease.  Thirdly, I have reviewed the patient's MRI scan. It is nonspecific,  demonstrating multiple white matter lesions. There is no history suggestive  of either a vasculitis or multiple sclerosis. This may well be related to  the patient's hypertension. I certainly think the patient needs to be on  antiplatelet therapy, as well as hypertensive control, and checking fasting  lipids.    I have discussed the case in detail with the patient.        Reviewed on 02/09/2012 7:23 PM                Coralie Keens, MD    cc:                       Coralie Keens, MD                            Cristine Polio, MD                            Little Ishikawa, MD      RJC/wmx; D: 02/09/2012 04:33 P; T: 02/09/2012 05:10 P; DOC# [id number]; Job#  [id number]

## 2012-02-09 NOTE — Other (Signed)
TRANSFER - OUT REPORT:    Verbal report given to Lanora Manis, RN(name) on Vanessa [name]  being transferred to PTU(unit) for routine progression of care       Report consisted of patient???s Situation, Background, Assessment and   Recommendations(SBAR).     Information from the following report(s) SBAR was reviewed with the receiving nurse.    Opportunity for questions and clarification was provided.

## 2012-02-09 NOTE — Progress Notes (Signed)
Bedside shift change report given to Alicia, RN (oncoming nurse) by Emily Forse, RN (offgoing nurse).  Report given with SBAR, Kardex, Intake/Output, MAR and Recent Results.

## 2012-02-09 NOTE — Progress Notes (Signed)
Bedside shift change report given to Marylu Lund, Charity fundraiser (oncoming nurse) by Helmut Muster, RN (offgoing nurse).  Report given with SBAR, Kardex, ED Summary, Intake/Output, MAR and Recent Results.

## 2012-02-09 NOTE — ED Provider Notes (Signed)
HPI Comments: 56 year old black female otherwise healthy who presents to the ER complaining of intermittent fleeting left-sided sharp to dull chest pain since 3:30 in the morning. The pain is rated at 4/10. The pain does not radiate to her neck jaw or back. She denies any associated nausea, vomiting, diaphoresis or shortness of breath. She denies any indigestion or nominal pain. Within the last month she was diagnosed with elevated blood pressure and was started on a medication but she states it made her feel tired she stopped taking it. Just complains of a vague headache as well. Her main risk factor for heart disease his family history. Denies pleurisy or calf pain he has no history of PE or DVT. He has no prior abdominal surgical history. He denies any history of aneurysm is no tearing sensation in her back. There was no treatment prior to arrival.    Patient is a 56 y.o. female presenting with chest pain.   Chest Pain (Angina)          History reviewed. No pertinent past medical history.     Past Surgical History   Procedure Date   ??? Hx gyn      tubal ligation         No family history on file.     History     Social History   ??? Marital Status: LEGALLY SEPARATED     Spouse Name: N/A     Number of Children: N/A   ??? Years of Education: N/A     Occupational History   ??? Not on file.     Social History Main Topics   ??? Smoking status: Not on file   ??? Smokeless tobacco: Not on file   ??? Alcohol Use:    ??? Drug Use:    ??? Sexually Active:      Other Topics Concern   ??? Not on file     Social History Narrative   ??? No narrative on file                  ALLERGIES: Review of patient's allergies indicates no known allergies.      Review of Systems   Cardiovascular: Positive for chest pain.   All other systems reviewed and are negative.        Filed Vitals:    02/09/12 0905   BP: 152/105   Pulse: 75   Temp: 98 ??F (36.7 ??C)   Resp: 16   Height: 5\' 7"  (1.702 m)   Weight: 68.04 kg (150 lb)   SpO2: 100%            Physical Exam    Nursing note and vitals reviewed.  Constitutional: She is oriented to person, place, and time. She appears well-developed and well-nourished. No distress.   HENT:   Head: Atraumatic.   Eyes: Pupils are equal, round, and reactive to light.   Neck: Normal range of motion. Neck supple. No JVD present.   Cardiovascular: Normal rate, regular rhythm and normal heart sounds.  Exam reveals no gallop and no friction rub.    No murmur heard.  Pulmonary/Chest: Effort normal and breath sounds normal. No stridor. No respiratory distress. She has no wheezes. She has no rales. She exhibits tenderness.   Abdominal: Soft. Bowel sounds are normal. She exhibits no abdominal bruit, no pulsatile midline mass and no mass. There is no hepatosplenomegaly. There is no tenderness. There is no rebound, no guarding and no CVA tenderness.   Musculoskeletal:  Normal range of motion.   Neurological: She is alert and oriented to person, place, and time.   Skin: Skin is warm and dry.        MDM     Differential Diagnosis; Clinical Impression; Plan:     Differential diagnoses-coronary syndrome, PE, pleurisy, chest wall pain, CVA, intracerebral hemorrhage, anxiety. Clinical impression/plan-patient presents with atypical left-sided chest pain as well as complaining of headache. Patient had a nonischemic EKG and a set of cardiac enzymes that were normal. She will be admitted to her primary care physician Dr. Oneida Alar for further risk stratification and neurological evaluation. The patient is okay with this plan.  Amount and/or Complexity of Data Reviewed:   Clinical lab tests:  Ordered and reviewed  Tests in the radiology section of CPT??:  Ordered and reviewed  Tests in the medicine section of the CPT??:  Ordered and reviewed  Discussion of test results with the performing providers:  No   Decide to obtain previous medical records or to obtain history from someone other than the patient:  Yes   Obtain history from someone other than the patient:  Yes    Review and summarize past medical records:  Yes   Discuss the patient with another provider:  Yes   Independant visualization of image, tracing, or specimen:  Yes  Risk of Significant Complications, Morbidity, and/or Mortality:   Presenting problems:  Moderate  Diagnostic procedures:  Low  Management options:  Low  Progress:   Patient progress:  Stable      Procedures    9:26 AM EKG-normal sinus rhythm, nonspecific ST changes, normal QT interval.    11:29 AM Progress note-patient states she has mild chest pain. Her case was discussed with her primary care physician Dr. Oneida Alar. At this time he will admit the patient for further risk stratification and neurological evaluation.

## 2012-02-09 NOTE — ED Notes (Signed)
Pt reports she woke up with chest pain and took her BP 156/90.  Complains of a headache and body aches since Friday. PCP is Dr. Oneida Alar who sent her over here this am.

## 2012-02-09 NOTE — H&P (Signed)
[id number]

## 2012-02-09 NOTE — ED Notes (Signed)
Pt given warm blanket for comfort.

## 2012-02-09 NOTE — Progress Notes (Signed)
Bedside shift change report given to Vivia Ewing, Charity fundraiser ([employer]abin [employer]) by Helmut Muster, RN (offgoing nurse).  Report given with SBAR, Kardex, ED Summary, Intake/Output, MAR and Recent Results.

## 2012-02-09 NOTE — H&P (Signed)
Name:       Vanessa [name], Vanessa [name]                Admitted:    02/09/2012    Account #:  [id number]                     DOB:         May 21, 1956  Physician:  Cristine Polio, MD         Age:         56                               HISTORY AND PHYSICAL      CHIEF COMPLAINT: Chest pain.    HISTORY: The patient called [place] early this morning complaining of chest pain  and was referred to the emergency room, where she was evaluated and  subsequently admitted. She states she noted the onset of left precordial  chest discomfort this morning. It was described as a sharp, stabbing pain  without associated shortness of breath or diaphoresis. It seemed to  increase with deep breathing to become a slow, dull, nagging pain. Duration  was variable. Because of the persistent discomfort, she presented to the  emergency room, where evaluation was undertaken. She is now admitted for  further evaluation and care.    CURRENT MEDICATIONS: Prozac.    PAST MEDICAL HISTORY: Remarkable for recurrent episodes of pneumonia in the  1980s and hospitalizations associated with that. She has known history of  fibroid tumor. She is followed by Dr. Gladis Riffle for depression and more  recently had a head MRI that was remarkable for nonspecific white matter  signal changes, the finding of which could occasionally be seen in the  setting of demyelinating disease and demyelinating process, on 12/24/2011.    SURGICAL HISTORY: Includes foot surgery in 1992 and bilateral tubal  ligation.    HABITS: No smoking or ETOH abuse.    REVIEW OF SYSTEMS  EYES: Unremarkable.  ENT: Complains of dizziness, particularly [facility]  BREASTS: Mammography within the past year.  CARDIOVASCULAR: As noted in the HPI.  PULMONARY: Denies cough.  GASTROINTESTINAL: No change in bowel habits.  GENITOURINARY: Unremarkable.  OB/GYN: She is gravida 3 with a son age 69 and daughters ages 59 and 3.  NEUROLOGIC: Complains of headaches described as a  pressure sensation in the  frontal area, and she grasps her hand and says it is a squeezing sensation.  She has had this headache since January when she had an MRI for its  evaluation. She was placed on Fioricet, which does not relieve the  headaches.  MUSCULOSKELETAL: Complains of bilateral shoulder discomfort as well as pain  across her shoulders. She complains of low back pain.    SOCIAL HISTORY: The patient is married and lives with her husband. He just  got home. He was incarcerated for the third time for DUI. He is unable to  get a job,  but he is currently self-employed as a Software engineer. She  works as a Teacher, adult education. Her daughter and the daughter's daughter live  with them in the household. She has multiple stressors, including that  related to her husband, finances, lack of a car and her home situation. She  is tearful when she speaks of this.    FAMILY HISTORY: Mother 58 with stage 4 chronic  kidney disease, coronary  artery disease and congestive heart failure. Father deceased at 66 of  myocardial infarction. Sister with breast cancer.    PHYSICAL EXAMINATION  GENERAL: Well-developed, well-nourished black female, tearful as we talked  about different issues related to her family.  VITAL SIGNS: Temperature 99.1, 61, 18, 133/78, 100% on room air.  HEENT: PERRLA, EOM intact.  ENT: Nares clear. Throat is clear.  NECK: Supple. No JVD at 45 degrees. Thyroid is palpably within normal  limits.  RESPIRATORY: Chest clear to auscultation and percussion.  BREASTS: Free of mass or tenderness.  CARDIOVASCULAR: Heart regular rate and rhythm, without significant murmurs  or gallops.  GASTROINTESTINAL: Abdomen soft; bowel sounds active. No palpable  organomegaly, masses or tenderness.________   1+ bilaterally. There is no  trigger point.  NEUROLOGIC: Nonfocal.  HEMATOLOGIC: No pathological lymph nodes are palpated.    IMPRESSIONS  1. Chest pain.  2. Headaches.  3. Depression.  4. History of primary hypertension.  5.  Fibroid uterus.  6. Low back pain.    DISCUSSION: The patient admitted. She will undergo stress testing. She will  be seen in consultation by Neurology and her psychiatrist.    Will continue to monitor on telemetry.                Cristine Polio, MD    cc:                       Cristine Polio, MD      REH/wmx; D: 02/09/2012 07:54 P; T: 02/09/2012 08:22 P; DOC# [id number]; Job#  [id number]

## 2012-02-09 NOTE — Progress Notes (Signed)
Active listening provided    John L. [name], Chaplain

## 2012-02-09 NOTE — Progress Notes (Signed)
CM note:  Noted consult placed by nursing to assist pt with Advanced Directives at pt request.  CM notified pastoral care who will come see pt this afternoon and facility completing AMD if she chooses.  Hannah Beat, MSW CRM

## 2012-02-10 LAB — METABOLIC PANEL, COMPREHENSIVE
A-G Ratio: 1.1 (ref 1.1–2.2)
ALT (SGPT): 150 U/L — ABNORMAL HIGH (ref 12–78)
AST (SGOT): 136 U/L — ABNORMAL HIGH (ref 15–37)
Albumin: 3.6 g/dL (ref 3.5–5.0)
Alk. phosphatase: 80 U/L (ref 50–136)
Anion gap: 8 mmol/L (ref 5–15)
BUN/Creatinine ratio: 30 — ABNORMAL HIGH (ref 12–20)
BUN: 19 MG/DL (ref 6–20)
Bilirubin, total: 0.2 MG/DL (ref 0.2–1.0)
CO2: 25 MMOL/L (ref 21–32)
Calcium: 8.9 MG/DL (ref 8.5–10.1)
Chloride: 108 MMOL/L (ref 97–108)
Creatinine: 0.63 MG/DL (ref 0.45–1.15)
GFR est AA: >60 mL/min/{1.73_m2} (ref >60)
GFR est non-AA: >60 mL/min/{1.73_m2} (ref >60)
Globulin: 3.4 g/dL (ref 2.0–4.0)
Glucose: 92 MG/DL (ref 65–100)
Potassium: 3.9 MMOL/L (ref 3.5–5.1)
Protein, total: 7 g/dL (ref 6.4–8.2)
Sodium: 141 MMOL/L (ref 136–145)

## 2012-02-10 LAB — LIPID PANEL
CHOL/HDL Ratio: 3.1 (ref 0–5.0)
Cholesterol, total: 206 MG/DL — ABNORMAL HIGH (ref <200)
HDL Cholesterol: 66 MG/DL
LDL, calculated: 114.2 MG/DL — ABNORMAL HIGH (ref 0–100)
Triglyceride: 129 MG/DL (ref <150)
VLDL, calculated: 25.8 MG/DL

## 2012-02-10 LAB — CBC WITH AUTOMATED DIFF
ABS. BASOPHILS: 0 10*3/uL (ref 0.0–0.1)
ABS. EOSINOPHILS: 0.1 10*3/uL (ref 0.0–0.4)
ABS. LYMPHOCYTES: 2.2 10*3/uL (ref 0.8–3.5)
ABS. MONOCYTES: 0.3 10*3/uL (ref 0.0–1.0)
ABS. NEUTROPHILS: 2.2 10*3/uL (ref 1.8–8.0)
BASOPHILS: 0 % (ref 0–1)
EOSINOPHILS: 2 % (ref 0–7)
HCT: 43.4 % (ref 35.0–47.0)
HGB: 14.6 g/dL (ref 11.5–16.0)
LYMPHOCYTES: 46 % (ref 12–49)
MCH: 31.9 PG (ref 26.0–34.0)
MCHC: 33.6 g/dL (ref 30.0–36.5)
MCV: 94.8 FL (ref 80.0–99.0)
MONOCYTES: 6 % (ref 5–13)
NEUTROPHILS: 46 % (ref 32–75)
PLATELET: 312 10*3/uL (ref 150–400)
RBC: 4.58 M/uL (ref 3.80–5.20)
RDW: 13.9 % (ref 11.5–14.5)
WBC: 4.7 10*3/uL (ref 3.6–11.0)

## 2012-02-10 LAB — STRESS TEST MYOVIEW
ECG Interp. Before Exercise: NORMAL
Functional capacity: NORMAL
Max. Diastolic BP: 100 mmHg
Max. Heart rate: 150 {beats}/min
Max. Systolic BP: 180 mmHg
Peak Ex METs: 10.1 METS

## 2012-02-10 MED ORDER — SODIUM CHLORIDE 0.9 % IJ SYRG
Freq: Once | INTRAMUSCULAR | Status: AC
Start: 2012-02-10 — End: 2012-02-10
  Administered 2012-02-10: 16:00:00

## 2012-02-10 MED ORDER — TIZANIDINE 2 MG CAP
2 mg | ORAL_CAPSULE | Freq: Every evening | ORAL | Status: DC | PRN
Start: 2012-02-10 — End: 2013-12-22

## 2012-02-10 MED ADMIN — tiZANidine (ZANAFLEX) tablet 2 mg: ORAL | @ 02:00:00 | NDC 68084001311

## 2012-02-10 MED FILL — TIZANIDINE 4 MG TAB: 4 mg | ORAL | Qty: 1

## 2012-02-10 MED FILL — BD POSIFLUSH NORMAL SALINE 0.9 % INJECTION SYRINGE: INTRAMUSCULAR | Qty: 10

## 2012-02-10 MED FILL — LOVENOX 40 MG/0.4 ML SUBCUTANEOUS SYRINGE: 40 mg/0.4 mL | SUBCUTANEOUS | Qty: 0.4

## 2012-02-10 MED FILL — BD POSIFLUSH NORMAL SALINE 0.9 % INJECTION SYRINGE: INTRAMUSCULAR | Qty: 30

## 2012-02-10 NOTE — Progress Notes (Signed)
Location: 4PTU - [id number]  Attn.: HaithcockDesmond [name]  DOB: Mar 04, 1956 / Age: 56  MR#: [id number] / Admit#: [id number]  Pt. First Name: Vanessa [name]  Pt. Last Name: Vanessa [name] [facility]  [address]  [place], [place]  [address]                        Case Management - Progress Note  Initial Open Date: 02/10/2012  Case Manager: Vanessa [name]    Initial Open Date:  Social Worker: Vanessa [name]  Expected Date of Discharge:  Transferred From: home  ECF Bed Held Until:  Bed Held By:  Power of Attorney:  POA/Guardian/Conservator Capacity:  Primary Caregiver: Em Contact: Vanessa [name],Vanessa [name] Other Relative [phone]  Living Arrangements: Own Home  Source of Income:  Payee:  Psychosocial History:  Cultural/Religious/Language Issues:  Education Level:  ADLS/Current Living Arrangements Issues: Lives at home with spouse and  daughter  Past Providers:  Will patient perform self care at discharge? Y  Anticipated Discharge Disposition Goal: Return to admission address  Assessment/Plan:        02/10/2012 11:10A  Late entry from yesterday----noted cm consult when pt  arrived to floor from ED, admitting placed cm consult for AMD completion.  CM spoke to pastoral care on duty yesterday regarding pt's request and  pastoral care came to visit pt in afternoon regarding pt request to  complete AMD.  CM doesn't identify other discharge needs, will assist if  needed.  Vanessa [name], MSW CRM        02/10/2012 10:17A  Chart reviewed for med necessity.  No d/c needs  identified at this time.  SB Vanessa Fogo, RN CRM  Resources at Discharge:  Service Providers at Discharge:  Dictating Provider:  Judi [name], Vanessa [name]

## 2012-02-10 NOTE — Progress Notes (Signed)
Hospital Progress Note    NAME:  Vanessa [name]   DOB:   26-Dec-1955   MRN:  [id number]     Date/Time:  02/10/2012 8:00 AM    Plan:   1. Stress test today  2. dispo pending result  Risk of Deterioration: Low  [x]            Moderate  []            High  []                  Assessment:   Active Problems:   Chest pain, unspecified (02/09/2012) - will exclude cardiac with stress test     Headache (02/09/2012) - neuro note reviewed improved     Elevated liver enzymes (02/10/2012) -will investigate as ordered     Anxiety and depression (02/10/2012) psych opinion pending     Low back pain (02/10/2012) musculeoskelatal              Subjective:     Feeling better this am -   11 Point Review of Systems:   Negative except no further chest pain    []             Unable to obtain ROS due to:       []             mental status change []             sedated []             intubated     History   Substance Use Topics   ??? Smoking status: Never Smoker    ??? Smokeless tobacco: Never Used   ??? Alcohol Use: No     Medications reviewed:  Current Facility-Administered Medications   Medication Dose Route Frequency   ??? sodium chloride (NS) flush 10 mL  10 mL InterCATHeter RAD ONCE   ??? tiZANidine (ZANAFLEX) tablet 2 mg  2 mg Oral QHS PRN   ??? ketorolac (TORADOL) injection 30 mg  30 mg IntraVENous NOW   ??? FLUoxetine (PROZAC) capsule 20 mg  20 mg Oral DAILY   ??? butalbital-acetaminophen-caffeine (FIORICET, ESGIC) per tablet 1 Tab  1 Tab Oral Q4H PRN   ??? SALINE PERIPHERAL FLUSH Q8H Soln 5 mL  5 mL InterCATHeter Q8H   ??? saline peripheral flush Soln 5 mL  5 mL InterCATHeter PRN   ??? acetaminophen (TYLENOL) tablet 650 mg  650 mg Oral Q4H PRN   ??? HYDROcodone-acetaminophen (LORTAB) 0.5-33.3 mg/mL oral solution 10 mL  10 mL Oral Q4H PRN   ??? acetaminophen (TYLENOL) tablet 650 mg  650 mg Oral Q4H PRN   ??? ondansetron (ZOFRAN) injection 4 mg  4 mg IntraVENous Q4H PRN   ??? magnesium hydroxide (MILK OF MAGNESIA) oral suspension 30 mL  30 mL Oral DAILY PRN   ??? bisacodyl (DULCOLAX)  suppository 10 mg  10 mg Rectal DAILY PRN   ??? enoxaparin (LOVENOX) injection 40 mg  40 mg SubCUTAneous Q24H        Objective:   Vitals:  BP 131/80   Pulse 55   Temp 96.7 ??F (35.9 ??C)   Resp 16   Ht 5\' 7"  (1.702 m)   Wt 152 lb 3.2 oz   BMI 23.84 kg/m2   SpO2 100%  Temp (24hrs), Avg:97.6 ??F (36.4 ??C), Min:96.7 ??F (35.9 ??C), Max:99.1 ??F (37.3 ??C)      O2 Device: Room air    Last 24hr Input/Output:    Intake/Output  Summary (Last 24 hours) at 02/10/12 0800  Last data filed at 02/09/12 1354   Gross per 24 hour   Intake    240 ml   Output      0 ml   Net    240 ml        PHYSICAL EXAM:  General:    Alert, cooperative, no distress, appears stated age.     Head:   Normocephalic, without obvious abnormality, atraumatic.  Eyes:   Conjunctivae/corneas clear.  PERRLA  Nose:  Nares normal. No drainage or sinus tenderness.  Throat:    Lips, mucosa, and tongue normal.  No Thrush  Neck:  Supple, symmetrical,  no adenopathy, thyroid: non tender    no carotid bruit and no JVD.  Back:    Symmetric,  No CVA tenderness.  Lungs:   Clear to auscultation bilaterally.  No Wheezing or Rhonchi. No rales.  Chest wall:  No tenderness or deformity. No Accessory muscle use.  Heart:   Regular rate and rhythm,  no murmur, rub or gallop.  Abdomen:   Soft, non-tender. Not distended.  Bowel sounds normal. No masses  Extremities: Extremities normal, atraumatic, No cyanosis.  No edema. No clubbing  Skin:     Texture, turgor normal. No rashes or lesions.  Not Jaundiced  Lymph nodes: Cervical, supraclavicular normal.  Psych:  Good insight.   depressed.  Not anxious or agitated.  Neurologic: Normal strength, Alert and oriented X 3.       Lab Data Reviewed:    Recent Labs   Basename 02/10/12 0405 02/09/12 0930    WBC 4.7 5.4    HGB 14.6 14.9    HCT 43.4 43.9    PLT 312 304     Recent Labs   Basename 02/10/12 0405 02/09/12 0930    NA 141 141    K 3.9 3.8    CL 108 106    CO2 25 28    GLU 92 91    BUN 19 16    CREA 0.63 0.52    CA 8.9 9.5    MG -- --    PHOS  -- --    ALB 3.6 3.9    TBIL 0.2 0.3    SGOT 136* 22    INR -- --           ___________________________________________________  ___________________________________________________    Attending Physician: Cristine Polio, MD

## 2012-02-10 NOTE — Discharge Summary (Signed)
[id number]

## 2012-02-10 NOTE — Progress Notes (Signed)
Problem: Falls - Risk of  Goal: *Absence of falls  Outcome: Progressing Towards Goal  Bed in lowest position with wheels locked.  Call bell and frequently used items within reach.  Pt instructed to call for assistance with ambulation.

## 2012-02-10 NOTE — Progress Notes (Signed)
I have reviewed discharge instructions with the patient.  The patient verbalized understanding. Opportunity for questions was given. IV and tele box were d/c'd. Electronic signature not working. Pt refused wheelchair to pt discharge and wanted to walk herself out.

## 2012-02-10 NOTE — Progress Notes (Signed)
Consult received, chart checked, attempted eval but pt off the floor at a stress test. Case discussed with nursing. Pt's nurse reports that pt amb to the stretcher without any noted difficulty. Discussed details of PT order with pt's nurse: moist heat and massage. We discussed that in this setting we do not provide those services but that the hospital heating pads are used and she is going to follow up on that. Will follow up later today as able and appropriate, otherwise tomorrow.

## 2012-02-10 NOTE — Discharge Summary (Signed)
Name:       Vanessa [name], Vanessa [name]                Admitted:    02/09/2012                                               Discharged:  Account #:  [id number]                     DOB:         08-09-1956  Consultant: Cristine Polio, MD         Age          56                                 DISCHARGE SUMMARY      HISTORY: The patient called [place] earlier this morning complaining of chest  pain and was referred to the emergency room where she was evaluated and  subsequently admitted. She states she noticed onset of left precordial  chest discomfort this morning. It was described as sharp, stabbing pain  without associated shortness of breath or diaphoresis. It seemed to  increase with deep breathing to come to a slow dull, nagging pain. Duration  was variable. Because of persistent chest discomfort, she presented to the  emergency room, where evaluation was undertaken. She is now admitted for  further evaluation and care.    PAST MEDICAL HISTORY/REVIEW OF SYSTEMS/PHYSICAL EXAMINATION: See HPI.    CONSULTATION  Alecia Lemming, MD. IMPRESSION: muscle contraction headaches, history of  depression, low back pain and abnormal MRI. RECOMMENDATIONS: Consider  muscle relaxant, x-ray spine as well as SI joint, fasting lipids and blood  pressure control.    RESULTS REVIEW: Stress Myoview no evidence of myocardium at risk. Normal  wall motion with ejection fraction of 56%.    Hemoglobin 14.6, hematocrit 43.4, WBC 4.7. Metabolic panel, otherwise,  unremarkable. ALT was 150, AST was 136. Cholesterol 206, HDL 66, LDL 114.  Cardiac enzymes were negative. Hepatitis panel not available at time of  dictation.    X-ray lumbar spine: No acute process.    X-ray cervical spine. Normal exam.    Chest x-ray: No acute process.    Abdominal ultrasound: Mild hepatic steatosis, otherwise, unremarkable.    HOSPITAL COURSE: As above. She was placed on telemetry, where she remained  in normal sinus rhythm throughout the remainder of her hospital stay.  She  had no further complaints of chest discomfort. She complained of headache  described as a vice-type sensation around her head and was seen in  consultation by Dr. Noel Gerold whose notes were reviewed with her. She  complained of low back pain, which was treated symptomatically. We  discussed a number of stressors with her on the evening of admission and  the morning of discharge. She underwent stress testing, which excluded  cardiac origin of the chest discomfort, which is now resolved. The only  other issue was that related to elevated liver function tests. Ultrasound  was completed, but the hepatitis studies are not available at the time of  discharge. This will be followed up as an outpatient.    At this point, then, her medical status is stable, she is being discharged  home ambulatory, in satisfactory  condition, improved.    FINAL DIAGNOSES  1. Chest pain, cardiac origin excluded.  2. Muscle contraction headaches.  3. Anxiety depressive neuroses.  4. Chronic low back pain.  5. Abnormal liver function tests.  6. Fibroid uterus.  7. Primary hypertension.    DISPOSITION: Discharged home ambulatory.    DISCHARGE MEDICATIONS  1. Zanaflex 2 mg at bedtime.  2. Fioricet 1 q.4h., p.r.n.  3. Prozac 20 mg daily.  4. Hydrochlorothiazide 12.5 mg daily.    FOLLOWUP: Return to the office in 3 days for followup care.    DIET: [employer], low concentrated sweet diet.        Reviewed on 02/10/2012 3:33 PM              Cristine Polio, MD    cc:    Cristine Polio, MD      REH/wmx; D: 02/10/2012 02:41 P; T: 02/10/2012 03:13 P; DOC# [id number]; Job#  [id number]

## 2012-02-10 NOTE — Progress Notes (Signed)
Patient may return to work following discharge from [facility].

## 2012-02-10 NOTE — Progress Notes (Signed)
Pressure Ulcer Documentation  (COMPLETE ONE LABEL PER PRESSURE ULCER)  For further information, please review corresponding Wound Care flowsheet.      Vanessa [name] has:    No pressure ulcer noted and pressure ulcer prevention initiated.      Vanessa Iha, RN

## 2012-02-11 LAB — HEPATITIS PANEL, ACUTE
Hep B Core Ab, IgM: NEGATIVE
Hep B surface Ag screen: NEGATIVE
Hep C Virus Ab: <0.1 s/co ratio (ref 0.0–0.9)
Hepatitis A Ab, IgM: NEGATIVE

## 2012-02-27 NOTE — ED Notes (Signed)
Assumed care of patient, arrived to room 14 via EMS. Pt reports chest pain that gets worse with deep breaths or movement. Pt reports approx 2 weeks ago had similar symptoms and was dx: with muscle spasms.   Dr. Dedra Skeens at bedside.

## 2012-02-27 NOTE — ED Provider Notes (Addendum)
HPI Comments: 56 yo female presents via EMS to ED with cc of severe CP x 2100. Pt reports CP increases with movement and relieves with staying still. Pt notes receiving nitro from EMS on arrival with minimal improvement. Pt reports recent Hx of similar sxs for which she was admitted to [facility] with negative workup. Pt denies family Hx of blood clots. Pt denies recent long trips, nausea, SOB, swelling, lightheadedness, or dizziness.    UUV:OZDGUYQI E HAITHCOCK, MD     PSHx significant for: tubal ligation  Social Hx: -tobacco  Family Hx: CHF (mother)    There are no other complaints, changes or physical findings at this time.   Written by Mickeal Skinner, ED Scribe, as dictated by Verl Bangs, MD      The history is provided by the patient.        No past medical history on file.     Past Surgical History   Procedure Date   ??? Hx gyn      tubal ligation         No family history on file.     History     Social History   ??? Marital Status: LEGALLY SEPARATED     Spouse Name: N/A     Number of Children: N/A   ??? Years of Education: N/A     Occupational History   ??? Not on file.     Social History Main Topics   ??? Smoking status: Never Smoker    ??? Smokeless tobacco: Never Used   ??? Alcohol Use: No   ??? Drug Use: No   ??? Sexually Active: Yes -- Female partner(s)     Birth Control/ Protection: None     Other Topics Concern   ??? Not on file     Social History Narrative   ??? No narrative on file                  ALLERGIES: Review of patient's allergies indicates no known allergies.      Review of Systems   Constitutional: Negative.    HENT: Negative.    Eyes: Negative.    Respiratory: Negative.  Negative for shortness of breath.    Cardiovascular: Positive for chest pain. Negative for leg swelling.   Gastrointestinal: Negative.  Negative for nausea.   Genitourinary: Negative.    Musculoskeletal: Negative.  Negative for joint swelling.   Neurological: Negative.  Negative for dizziness and light-headedness.   All other systems  reviewed and are negative.        Filed Vitals:    02/27/12 2202   BP: 142/78   Pulse: 80   Resp: 16   SpO2: 100%            Physical Exam     Const:  Mild acute distress, well developed, well nourished  Head:  Atraumatic, normocephalic  Eyes:  PERRL, conjunctiva normal, no scleral icterus  Neck:  Supple, trachea midline  Cardiovascular:  RRR, no murmurs, no gallops, no rubs, moderate left sided chest tenderness to palpation  Resp:  No resp distress, no increased work of breathing, no wheezes, no rhonchi, no rales,  Abd:  Soft, non-tender, non-distended, no rebound, no guarding, no CVA tenderness  GU:  Deferred  MSK:  No pedal edema, normal ROM  Neuro:  Alert and oriented x3, no cranial nerve defect  Skin:  Warm, dry, intact  Psych: normal mood and affect, behavior is normal, judgement and thought content  is normal        MDM     Amount and/or Complexity of Data Reviewed:   Clinical lab tests:  Ordered and reviewed  Tests in the radiology section of CPT??:  Ordered and reviewed  Tests in the medicine section of the CPT??:  Ordered and reviewed   Review and summarize past medical records:  Yes   Independant visualization of image, tracing, or specimen:  Yes  Progress:   Patient progress:  Stable and improved    11:38 PM  Pt. Says that she is pain free.  Her chest pain have completely resolved.    Pt.'s chest pain is atypical for ACS.  She had negative stress test earlier this month.  Pt. Is pain free in the ED.  Pt. Low risk for PE with negative D-dimer.  I will get two sets of cardiac enzymes 2 hours apart.  Pt's symptoms seem MSK in origin.  I will signs out to Dr. Kathreen Cosier who will f/u on the second set of enzymes and will assume care of the patient.      Procedures    EKG interpretation: (Preliminary)  Rhythm: normal sinus rhythm; and regular . Rate (approx.): 66; Axis: normal; P wave: normal; QRS interval: normal ; ST/T wave: non-specific changes; Other findings: unchanged from previous ekg.

## 2012-02-27 NOTE — ED Notes (Signed)
Pt ambulated to BR to provide urine specimen with min assist

## 2012-02-27 NOTE — ED Notes (Signed)
Pt resting quietly in bed, no ss distress. Cb in reach. VSS

## 2012-02-28 LAB — D-DIMER, QUANTITATIVE: D-Dimer, Quant: 0.25 mg/L FEU (ref 0.00–0.65)

## 2012-02-28 LAB — URINALYSIS W/MICROSCOPIC
Bacteria: NEGATIVE /HPF
Bilirubin: NEGATIVE
Blood: NEGATIVE
Glucose: NEGATIVE MG/DL
Ketone: NEGATIVE MG/DL
Nitrites: NEGATIVE
Specific gravity: 1.014 (ref 1.003–1.030)
Urobilinogen: 0.2 EU/DL (ref 0.2–1.0)
pH (UA): 8 (ref 5.0–8.0)

## 2012-02-28 LAB — METABOLIC PANEL, COMPREHENSIVE
A-G Ratio: 1.1 (ref 1.1–2.2)
ALT (SGPT): 81 U/L — ABNORMAL HIGH (ref 12–78)
AST (SGOT): 33 U/L (ref 15–37)
Albumin: 3.8 g/dL (ref 3.5–5.0)
Alk. phosphatase: 103 U/L (ref 50–136)
Anion gap: 8 mmol/L (ref 5–15)
BUN/Creatinine ratio: 25 — ABNORMAL HIGH (ref 12–20)
BUN: 15 MG/DL (ref 6–20)
Bilirubin, total: 0.2 MG/DL (ref 0.2–1.0)
CO2: 26 MMOL/L (ref 21–32)
Calcium: 9 MG/DL (ref 8.5–10.1)
Chloride: 107 MMOL/L (ref 97–108)
Creatinine: 0.59 MG/DL (ref 0.45–1.15)
GFR est AA: >60 mL/min/{1.73_m2} (ref >60)
GFR est non-AA: >60 mL/min/{1.73_m2} (ref >60)
Globulin: 3.6 g/dL (ref 2.0–4.0)
Glucose: 113 MG/DL — ABNORMAL HIGH (ref 65–100)
Potassium: 3.3 MMOL/L — ABNORMAL LOW (ref 3.5–5.1)
Protein, total: 7.4 g/dL (ref 6.4–8.2)
Sodium: 141 MMOL/L (ref 136–145)

## 2012-02-28 LAB — CK W/ REFLX CKMB
CK: 130 U/L (ref 26–192)
CK: 107 U/L (ref 26–192)

## 2012-02-28 LAB — CBC WITH AUTOMATED DIFF
ABS. BASOPHILS: 0 10*3/uL (ref 0.0–0.1)
ABS. EOSINOPHILS: 0.1 10*3/uL (ref 0.0–0.4)
ABS. LYMPHOCYTES: 3.4 10*3/uL (ref 0.8–3.5)
ABS. MONOCYTES: 0.4 10*3/uL (ref 0.0–1.0)
ABS. NEUTROPHILS: 4 10*3/uL (ref 1.8–8.0)
BASOPHILS: 0 % (ref 0–1)
EOSINOPHILS: 1 % (ref 0–7)
HCT: 41.9 % (ref 35.0–47.0)
HGB: 14.3 g/dL (ref 11.5–16.0)
LYMPHOCYTES: 43 % (ref 12–49)
MCH: 31.6 PG (ref 26.0–34.0)
MCHC: 34.1 g/dL (ref 30.0–36.5)
MCV: 92.7 FL (ref 80.0–99.0)
MONOCYTES: 5 % (ref 5–13)
NEUTROPHILS: 51 % (ref 32–75)
PLATELET: 336 10*3/uL (ref 150–400)
RBC: 4.52 M/uL (ref 3.80–5.20)
RDW: 13.3 % (ref 11.5–14.5)
WBC: 8 10*3/uL (ref 3.6–11.0)

## 2012-02-28 LAB — TROPONIN I
Troponin-I, Qt.: <0.04 ng/mL (ref <0.05)
Troponin-I, Qt.: <0.04 ng/mL (ref <0.05)

## 2012-02-28 LAB — D DIMER: D-dimer: 0.25 mg/L FEU (ref 0.00–0.65)

## 2012-02-28 MED ORDER — ASPIRIN 325 MG TAB
325 mg | ORAL | Status: AC
Start: 2012-02-28 — End: 2012-02-27
  Administered 2012-02-28: 02:00:00 via ORAL

## 2012-02-28 MED ORDER — POTASSIUM CHLORIDE SR 10 MEQ TAB
10 mEq | ORAL | Status: AC
Start: 2012-02-28 — End: 2012-02-27
  Administered 2012-02-28: 04:00:00 via ORAL

## 2012-02-28 MED ORDER — POTASSIUM CHLORIDE SR 10 MEQ TAB
10 mEq | ORAL_TABLET | Freq: Every day | ORAL | Status: AC
Start: 2012-02-28 — End: 2012-03-13

## 2012-02-28 MED FILL — K-TAB 10 MEQ TABLET,EXTENDED RELEASE: 10 mEq | ORAL | Qty: 4

## 2012-02-28 MED FILL — ASPIRIN 325 MG TAB: 325 mg | ORAL | Qty: 1

## 2012-02-28 NOTE — ED Notes (Signed)
Pt still attempting to contact husband for DC ride home

## 2012-02-28 NOTE — ED Notes (Signed)
Pt sleeping in bed, VSS. CB in reach

## 2012-02-28 NOTE — ED Notes (Signed)
Pt resting quietly in bed with eyes closed, VSS. CB in reach

## 2012-02-28 NOTE — ED Notes (Signed)
Pt ambulated out of Ed without difficulty. Husband here with private vehicle for DC home.

## 2012-02-28 NOTE — ED Notes (Signed)
Dr. Kathreen Cosier reviewed discharge instructions with the patient.  The patient verbalized understanding.    Pt verb she is calling her husband to pick her up for DC

## 2012-02-28 NOTE — ED Notes (Signed)
Dr. Sauer at bedside.

## 2012-02-28 NOTE — ED Notes (Signed)
Pt verb her husband is on the way to pick her up now. PIV removed without difficulty.

## 2012-02-29 LAB — EKG, 12 LEAD, INITIAL
Atrial Rate: 66 {beats}/min
Calculated P Axis: 65 degrees
Calculated R Axis: 63 degrees
Calculated T Axis: 21 degrees
Diagnosis: NORMAL
P-R Interval: 142 ms
Q-T Interval: 350 ms
QRS Duration: 78 ms
QTC Calculation (Bezet): 366 ms
Ventricular Rate: 66 {beats}/min

## 2012-03-10 ENCOUNTER — Encounter

## 2012-03-19 ENCOUNTER — Encounter

## 2012-08-12 LAB — CBC WITH AUTOMATED DIFF
ABS. BASOPHILS: 0 10*3/uL (ref 0.0–0.1)
ABS. EOSINOPHILS: 0.1 10*3/uL (ref 0.0–0.4)
ABS. LYMPHOCYTES: 2.6 10*3/uL (ref 0.8–3.5)
ABS. MONOCYTES: 0.2 10*3/uL (ref 0.0–1.0)
ABS. NEUTROPHILS: 3.7 10*3/uL (ref 1.8–8.0)
BASOPHILS: 0 % (ref 0–1)
EOSINOPHILS: 1 % (ref 0–7)
HCT: 42.5 % (ref 35.0–47.0)
HGB: 14.6 g/dL (ref 11.5–16.0)
LYMPHOCYTES: 40 % (ref 12–49)
MCH: 31.8 PG (ref 26.0–34.0)
MCHC: 34.4 g/dL (ref 30.0–36.5)
MCV: 92.6 FL (ref 80.0–99.0)
MONOCYTES: 3 % — ABNORMAL LOW (ref 5–13)
NEUTROPHILS: 56 % (ref 32–75)
PLATELET: 328 10*3/uL (ref 150–400)
RBC: 4.59 M/uL (ref 3.80–5.20)
RDW: 13.9 % (ref 11.5–14.5)
WBC: 6.6 10*3/uL (ref 3.6–11.0)

## 2012-08-12 LAB — METABOLIC PANEL, COMPREHENSIVE
A-G Ratio: 1.1 (ref 1.1–2.2)
ALT (SGPT): 38 U/L (ref 12–78)
AST (SGOT): 22 U/L (ref 15–37)
Albumin: 3.8 g/dL (ref 3.5–5.0)
Alk. phosphatase: 81 U/L (ref 50–136)
Anion gap: 9 mmol/L (ref 5–15)
BUN/Creatinine ratio: 23 — ABNORMAL HIGH (ref 12–20)
BUN: 14 MG/DL (ref 6–20)
Bilirubin, total: 0.2 MG/DL (ref 0.2–1.0)
CO2: 27 MMOL/L (ref 21–32)
Calcium: 9.3 MG/DL (ref 8.5–10.1)
Chloride: 105 MMOL/L (ref 97–108)
Creatinine: 0.61 MG/DL (ref 0.45–1.15)
GFR est AA: >60 mL/min/{1.73_m2} (ref >60)
GFR est non-AA: >60 mL/min/{1.73_m2} (ref >60)
Globulin: 3.6 g/dL (ref 2.0–4.0)
Glucose: 107 MG/DL — ABNORMAL HIGH (ref 65–100)
Potassium: 3.3 MMOL/L — ABNORMAL LOW (ref 3.5–5.1)
Protein, total: 7.4 g/dL (ref 6.4–8.2)
Sodium: 141 MMOL/L (ref 136–145)

## 2012-08-12 LAB — CK W/ CKMB & INDEX
CK - MB: <0.5 NG/ML — ABNORMAL LOW (ref 0.5–3.6)
CK: 148 U/L (ref 26–192)

## 2012-08-12 LAB — TROPONIN I: Troponin-I, Qt.: <0.04 ng/mL (ref <0.05)

## 2012-08-12 MED ADMIN — alum-mag hydroxide-simeth (MYLANTA) oral suspension 30 mL: ORAL | @ 19:00:00 | NDC 00121176130

## 2012-08-12 MED ADMIN — sodium chloride 0.9 % bolus infusion 100 mL: INTRAVENOUS | @ 20:00:00 | NDC 00409798437

## 2012-08-12 MED ADMIN — ioversol (OPTIRAY) 350 mg iodine/mL contrast solution 100 mL: INTRAVENOUS | @ 20:00:00 | NDC 00019133311

## 2012-08-12 MED ADMIN — aspirin (ASPIRIN) tablet 325 mg: ORAL | @ 20:00:00 | NDC 00904200960

## 2012-08-12 MED ADMIN — sodium chloride (NS) flush 10 mL: INTRAVENOUS | @ 20:00:00 | NDC 87701099893

## 2012-08-12 MED FILL — SODIUM CHLORIDE 0.9 % IV: INTRAVENOUS | Qty: 100

## 2012-08-12 MED FILL — BD POSIFLUSH NORMAL SALINE 0.9 % INJECTION SYRINGE: INTRAMUSCULAR | Qty: 10

## 2012-08-12 MED FILL — OPTIRAY 350 MG IODINE/ML INTRAVENOUS SOLUTION: 350 mg iodine/mL | INTRAVENOUS | Qty: 100

## 2012-08-12 MED FILL — MAG-AL PLUS 200 MG-200 MG-20 MG/5 ML ORAL SUSPENSION: 200-200-20 mg/5 mL | ORAL | Qty: 30

## 2012-08-12 NOTE — ED Notes (Signed)
Patient ambulated to restroom. Steady gait noted. Urine specimen obtained.

## 2012-08-12 NOTE — ED Notes (Signed)
Verbal report given to Karis, RN. Transferred patient care at this time.

## 2012-08-12 NOTE — ED Notes (Signed)
Dr. Haithcock at bedside evaluating patient.

## 2012-08-12 NOTE — Progress Notes (Signed)
Admission Medication Reconciliation:    Information obtained from:  Patient    Significant PMH/Disease States:   Past Medical History   Diagnosis Date   ??? Hypertension      Chief Complaint for this Admission:  Palpitations    Allergies:  Review of patient's allergies indicates no known allergies.    Prior to Admission Medications:   Prior to Admission Medications   Medication Last Dose Informant Patient Reported? Taking?   therapeutic multivitamin (THERAGRAN) tablet 08/11/2012 at Unknown  Yes Yes   Take 1 Tab by mouth daily.   tiZANidine (ZANAFLEX) 2 mg capsule 08/10/2012 at Unknown  No Yes   Take 1 Cap by mouth nightly as needed.   butalbital-acetaminophen-caffeine (FIORICET) 50-325-40 mg per tablet 06/03/2012 at Unknown  Yes Yes   Take 1 Tab by mouth every four (4) hours as needed.   FLUoxetine (PROZAC) 20 mg capsule 08/12/2012 at Unknown  Yes Yes   Take 20 mg by mouth daily.   hydrochlorothiazide (MICROZIDE) 12.5 mg capsule 08/12/2012 at Unknown  Yes Yes   Take 12.5 mg by mouth daily.        Comments/Recommendations: Updated PTA meds/reviewed patient's allergies.  Last doses clarified.  Added daily MVI

## 2012-08-12 NOTE — ED Notes (Signed)
Bedside and Verbal shift change report given to Lillia Corporal., RN (oncoming nurse) by Judeen Hammans., RN (offgoing nurse).  Report given with SBAR. VSS, patient resting on bed with family at bedside, call bell within reach, will continue to monitor.

## 2012-08-12 NOTE — Progress Notes (Signed)
TRANSFER - IN REPORT:    Verbal report received from Karis,RN(name) on Vanessa [name]  being received from ED(unit) for routine progression of care      Report consisted of patient???s Situation, Background, Assessment and   Recommendations(SBAR).     Information from the following report(s) SBAR, Kardex, ED Summary and Recent Results was reviewed with the receiving nurse.    Opportunity for questions and clarification was provided.      Assessment completed upon patient???s arrival to unit and care assumed.

## 2012-08-12 NOTE — ED Notes (Signed)
Dr. Oneida Alar paged. Awaiting admission orders.

## 2012-08-12 NOTE — Other (Signed)
TRANSFER - OUT REPORT:    Verbal report given to Layla, RN(name) on Vanessa [name]  being transferred to 2N(unit) for routine progression of care       Report consisted of patient???s Situation, Background, Assessment and   Recommendations(SBAR).     Information from the following report(s) SBAR was reviewed with the receiving nurse.    Opportunity for questions and clarification was provided.

## 2012-08-12 NOTE — H&P (Signed)
Name:       Vanessa [name], Vanessa [name]                  Admitted:    08/12/2012    Account #:  [id number]                     DOB:         03-24-1956  Physician:  Su Ley, MD     Age:         56                               HISTORY AND PHYSICAL      CHIEF COMPLAINT: Chest pain.    HISTORY: The patient presents to the emergency room complaining of  substernal pressure-type chest pain as if someone is sitting on her chest  she states. Sometimes associated with diaphoresis, sometimes associated  with shortness of breath and not necessarily activity related. She noted  the onset of pain on Monday after a court appearance with her sister and  family where she was placed on probation. Since that time, she noted  progressive increasing chest discomfort now reaching the point that it is  intolerable. She has no radiation of discomfort. In the emergency room,  EKG, enzymes, and CT angio were all performed and negative. She continues  to be quite uncomfortable with discomfort and now admitted for further  evaluation and care.    CURRENT MEDICATIONS: Include:  1. Fioricet.  2. Prozac.  3. Hydrochlorothiazide.  4. Theragran.  5. Zanaflex.    PAST MEDICAL HISTORY: She was here in April with an admission for chest  pain, and during that admission, a stress Myoview revealed no evidence of  myocardium at risk, normal wall motion with ejection fraction of 56% and  was also seen in consultation by Alecia Lemming, MD, for muscle contraction  headaches. Other medical problems include recurrent episodes of pneumonia  in 1980 and hospitalizations associated with that. Known history of fibroid  tumor. She has been followed in the past by Dr. Gladis Riffle for depression. She  had an MRI that was unremarkable except for nonspecific white matter  changes on 12/24/2011.    SURGICAL HISTORY: Includes foot surgery in 1992, and bilateral tubal  ligation.    HABITS: No smoking or ETOH abuse.    REVIEW OF SYSTEMS: A 13-point review of systems  positive for dizziness,  gravida 3 with a son age 27 and daughters, ages 23 and 70, recurrent  headaches, and otherwise 13-point review of systems unremarkable.    SOCIAL HISTORY: The patient is married. Her husband has been incarcerated  in the past for DUI and is unable to get a job. He is employed, otherwise,  as a Software engineer. She works as a Teacher, adult education. Her daughter and  daughter's daughter live with them in the household. There are multiple  stressors including that related to her husband, finances, lack of a car,  home situation, family situation with her sisters and recent loss of her  mother.    FAMILY HISTORY: Mother deceased at 27 with stage IV kidney disease,  coronary artery disease, acute CVA and congestive heart failure. Father  deceased at 92 of myocardial infarction. One sister with breast cancer.    PHYSICAL EXAMINATION  GENERAL: Well-developed, well-nourished black female resting quietly on a  stretcher, very tearful.  VITAL SIGNS: 97.6, 56, 17, 142/92.  EYES: PERRLA, EOM intact.  ENT: Nares clear. Throat is clear.  NECK: Supple. No JVD at 45 degrees. Thyroid is palpable within normal  limits.  CHEST: Clear.  HEART: Regular rate and rhythm.  ABDOMEN: Soft, bowel sounds active.  EXTREMITIES: 1+ bilaterally. No edema.  NEUROLOGIC: Nonfocal.  HEMATOLOGIC: No pathological lymph nodes are palpated.    IMPRESSIONS:  1. Chest pain.  2. Depressive disorder.  3. Muscle contraction headaches.  4. Chronic low back pain.  5. Primary hypertension.    DISCUSSION: The patient was seen in the emergency room. She will be placed  on telemetry. We will ask Cardiology and Psychiatry to see her in  consultation. She will be observed for the next 24 hours.                Su Ley, MD    cc:                       Su Ley, MD      REH/wmx; D: 08/12/2012 07:39 P; T: 08/12/2012 08:15 P; DOC# [id number]; Job#  [id number]

## 2012-08-12 NOTE — ED Notes (Signed)
Patient resting comfortably in stretcher. VSS. Respirations equal and unlabored. Awaiting Dr. Oneida Alar.

## 2012-08-12 NOTE — ED Notes (Signed)
Pt stated she feels her heart beating fast , started this am, denies n/v, denies fever, +chest pressure, radiates to posterior neck and shoulders, +sob, feels weak

## 2012-08-12 NOTE — ED Provider Notes (Signed)
HPI Comments: Vanessa [name] is a 56 y.o.female with pmhx of HTN who presents to the ED secondary to constant chest pressure, onset yesterday around 1800 after coming home from work- pt does computer-work. Pt denies waking up with the pain today and does not state when it returned, but she states that it is worse now. She rates her pain a 9/10 and describes it as aching and burning with an occasional left-sided sharp pain. Pt also reports palpitations, stating "my heart feels like it's pumping all over my body." She is unclear about whether the pain radiates but does c/o upper back pain across both shoulders- pt does not numerically rate this pain. She denies modifying factors. She denies nausea and SOB but does report diaphoresis- pt attributes this to "being menopausal." Pt reports having a headache earlier but has no complaint of this now. Pt has had this pain before and has seen her PCP about it- pt had a stress test 6 months ago. Denies abdominal pain. She denies hx of diabetes or high cholesterol. She has no other complaint of fever, chills, cough, congestion, diarrhea, urinary sx, or rash. No known drug allergies.    Old chart reviewed: Nuclear stress test 02/10/12, EF 56%, no evidence of ischemia.     Surgical hx: tubal ligation  Social hx: nonsmoker, no alcohol or drug use  PCP: Cristine Polio, MD    Note written by Mariella Saa, Scribe, as dictated by Volanda Napoleon, DO 3:12 PM    The history is provided by the patient.        Past Medical History   Diagnosis Date   ??? Hypertension         Past Surgical History   Procedure Date   ??? Hx gyn      tubal ligation         History reviewed. No pertinent family history.     History     Social History   ??? Marital Status: MARRIED     Spouse Name: N/A     Number of Children: N/A   ??? Years of Education: N/A     Occupational History   ??? Not on file.     Social History Main Topics   ??? Smoking status: Never Smoker    ??? Smokeless tobacco: Never Used   ??? Alcohol  Use: No   ??? Drug Use: No   ??? Sexually Active: Yes -- Female partner(s)     Birth Control/ Protection: None     Other Topics Concern   ??? Not on file     Social History Narrative   ??? No narrative on file                  ALLERGIES: Review of patient's allergies indicates no known allergies.      Review of Systems   Constitutional: Positive for diaphoresis. Negative for fever.   HENT: Negative for congestion and rhinorrhea.    Eyes: Negative for redness.   Respiratory: Negative for cough and shortness of breath.    Cardiovascular: Positive for chest pain (pressure with occasional L-sided sharp pain) and palpitations. Negative for leg swelling.   Gastrointestinal: Negative for nausea, vomiting, abdominal pain and diarrhea.   Genitourinary: Negative for dysuria.   Musculoskeletal: Positive for back pain (across shoulders, unclear if this is radiation from CP).   Skin: Negative for rash.   Neurological: Positive for headaches.   Hematological: Negative.    Psychiatric/Behavioral: Negative  for confusion.   All other systems reviewed and are negative.        Filed Vitals:    08/12/12 1415 08/12/12 1430 08/12/12 1500 08/12/12 1530   BP: 161/85 133/86 140/82 143/90   Pulse: 72 63 65 61   Temp:       Resp: 18 20 16 21    Height:       Weight:       SpO2: 98% 99% 98% 100%            Physical Exam   Nursing note and vitals reviewed.     Constitutional:  Patient appears well-developed and well-nourished. No distress.   HENT:   Head: Normocephalic and atraumatic.   Nose: Nose normal.   Mouth/Throat: Oropharynx is clear and moist. No oropharyngeal exudate.   Eyes: Conjunctivae and extraocular motions are normal. Pupils are equal, round, and reactive to light. Right eye exhibits no discharge. Left eye exhibits no discharge. No scleral icterus.   Neck: No tracheal deviation present.   Cardiovascular: Normal rate, regular rhythm, normal heart sounds and intact distal pulses.  Exam reveals no gallop and no friction rub.    No murmur  heard.  Pulmonary/Chest: Effort normal and breath sounds normal. Patient has no wheezes. Patient has no rales.   Abdominal: Soft. Patient exhibits no distension and no mass. No tenderness. No rebound and no guarding.   Musculoskeletal: Patient exhibits no edema and no tenderness.   Ext: Normal ROM in all four extremities; distal pulses.   Neurological: Patient is alert and oriented to person, place, and time. N II-XII grossly intact, sensory and motor are not focal or laterally asymmetric.  Skin: Skin is warm and dry. Patient is not diaphoretic.   Psychiatric: Patient has a normal mood and affect. Behavior is normal.     MDM    Procedures    EKG: (ED MD Interpretation)  Sinus bradycardia. Rate of 59. Normal axis and intervals. Nonspecific ST/T wave changes.  Note written by Mariella Saa, Scribe, as interpreted by Volanda Napoleon, DO 3:12 PM.    CONSULT NOTE:  Volanda Napoleon, DO spoke to Cristine Polio, MD concerning the patient. The patient's history, presentation, physical findings, and results were all discussed. Will come see the patient in 1 hour.  Note written by Mariella Saa, Scribe, as dictated by Volanda Napoleon, DO 4:15 PM      Labs Reviewed   CBC WITH AUTOMATED DIFF - Abnormal; Notable for the following:     MONOCYTES 3 (*)      All other components within normal limits   METABOLIC PANEL, COMPREHENSIVE - Abnormal; Notable for the following:     Potassium 3.3 (*)      Glucose 107 (*)      BUN/Creatinine ratio 23 (*)      All other components within normal limits   CK W/ CKMB & INDEX - Abnormal; Notable for the following:     CK - MB <0.5 (*)      All other components within normal limits   TROPONIN I   SAMPLES BEING HELD

## 2012-08-12 NOTE — H&P (Signed)
[id number]

## 2012-08-13 LAB — LIPID PANEL
CHOL/HDL Ratio: 3.1 (ref 0–5.0)
Cholesterol, total: 250 MG/DL — ABNORMAL HIGH (ref <200)
HDL Cholesterol: 81 MG/DL
LDL, calculated: 147.4 MG/DL — ABNORMAL HIGH (ref 0–100)
Triglyceride: 108 MG/DL (ref <150)
VLDL, calculated: 21.6 MG/DL

## 2012-08-13 LAB — METABOLIC PANEL, COMPREHENSIVE
A-G Ratio: 0.9 — ABNORMAL LOW (ref 1.1–2.2)
ALT (SGPT): 55 U/L (ref 12–78)
AST (SGOT): 42 U/L — ABNORMAL HIGH (ref 15–37)
Albumin: 3.7 g/dL (ref 3.5–5.0)
Alk. phosphatase: 94 U/L (ref 50–136)
Anion gap: 6 mmol/L (ref 5–15)
BUN/Creatinine ratio: 19 (ref 12–20)
BUN: 16 MG/DL (ref 6–20)
Bilirubin, total: 0.3 MG/DL (ref 0.2–1.0)
CO2: 25 MMOL/L (ref 21–32)
Calcium: 9.3 MG/DL (ref 8.5–10.1)
Chloride: 110 MMOL/L — ABNORMAL HIGH (ref 97–108)
Creatinine: 0.86 MG/DL (ref 0.45–1.15)
GFR est AA: >60 mL/min/{1.73_m2} (ref >60)
GFR est non-AA: >60 mL/min/{1.73_m2} (ref >60)
Globulin: 4.1 g/dL — ABNORMAL HIGH (ref 2.0–4.0)
Glucose: 95 MG/DL (ref 65–100)
Potassium: 3.9 MMOL/L (ref 3.5–5.1)
Protein, total: 7.8 g/dL (ref 6.4–8.2)
Sodium: 141 MMOL/L (ref 136–145)

## 2012-08-13 LAB — EKG, 12 LEAD, INITIAL
Atrial Rate: 59 {beats}/min
Atrial Rate: 54 {beats}/min
Calculated P Axis: 61 degrees
Calculated P Axis: 52 degrees
Calculated R Axis: 27 degrees
Calculated R Axis: 25 degrees
Calculated T Axis: 28 degrees
Calculated T Axis: -12 degrees
P-R Interval: 140 ms
P-R Interval: 138 ms
Q-T Interval: 396 ms
Q-T Interval: 390 ms
QRS Duration: 82 ms
QRS Duration: 80 ms
QTC Calculation (Bezet): 392 ms
QTC Calculation (Bezet): 369 ms
Ventricular Rate: 59 {beats}/min
Ventricular Rate: 54 {beats}/min

## 2012-08-13 LAB — CBC WITH AUTOMATED DIFF
ABS. BASOPHILS: 0 10*3/uL (ref 0.0–0.1)
ABS. EOSINOPHILS: 0.1 10*3/uL (ref 0.0–0.4)
ABS. LYMPHOCYTES: 1.6 10*3/uL (ref 0.8–3.5)
ABS. MONOCYTES: 0.2 10*3/uL (ref 0.0–1.0)
ABS. NEUTROPHILS: 2.3 10*3/uL (ref 1.8–8.0)
BASOPHILS: 1 % (ref 0–1)
EOSINOPHILS: 2 % (ref 0–7)
HCT: 45.7 % (ref 35.0–47.0)
HGB: 15.5 g/dL (ref 11.5–16.0)
LYMPHOCYTES: 37 % (ref 12–49)
MCH: 32.5 PG (ref 26.0–34.0)
MCHC: 33.9 g/dL (ref 30.0–36.5)
MCV: 95.8 FL (ref 80.0–99.0)
MONOCYTES: 4 % — ABNORMAL LOW (ref 5–13)
NEUTROPHILS: 56 % (ref 32–75)
PLATELET: 302 10*3/uL (ref 150–400)
RBC: 4.77 M/uL (ref 3.80–5.20)
RDW: 14.3 % (ref 11.5–14.5)
WBC: 4.2 10*3/uL (ref 3.6–11.0)

## 2012-08-13 LAB — NUCLEATED RBC
ABSOLUTE NRBC: 0.04 10*3/uL — ABNORMAL HIGH (ref 0.00–0.01)
NRBC: 1 PER 100 WBC — ABNORMAL HIGH

## 2012-08-13 LAB — CK W/ CKMB & INDEX
CK - MB: <0.5 NG/ML — ABNORMAL LOW (ref 0.5–3.6)
CK: 107 U/L (ref 26–192)

## 2012-08-13 LAB — TROPONIN I: Troponin-I, Qt.: <0.04 ng/mL (ref <0.05)

## 2012-08-13 MED ADMIN — sodium chloride (NS) flush 5-10 mL: INTRAVENOUS | @ 10:00:00 | NDC 82903065462

## 2012-08-13 MED ADMIN — sodium chloride (NS) flush 5-10 mL: INTRAVENOUS | @ 20:00:00 | NDC 87701099893

## 2012-08-13 MED ADMIN — acetaminophen (TYLENOL) tablet 650 mg: ORAL | @ 14:00:00 | NDC 50580050130

## 2012-08-13 MED ADMIN — pantoprazole (PROTONIX) tablet 40 mg: ORAL | @ 14:00:00 | NDC 00008060704

## 2012-08-13 MED ADMIN — pantoprazole (PROTONIX) tablet 40 mg: ORAL | @ 20:00:00 | NDC 00008060704

## 2012-08-13 MED ADMIN — FLUoxetine (PROZAC) capsule 20 mg: ORAL | @ 14:00:00 | NDC 68084060511

## 2012-08-13 MED ADMIN — LORazepam (ATIVAN) tablet 0.5 mg: ORAL | @ 22:00:00 | NDC 00904598061

## 2012-08-13 MED ADMIN — acetaminophen (TYLENOL) tablet 650 mg: ORAL | @ 20:00:00 | NDC 50580050130

## 2012-08-13 MED ADMIN — sodium chloride (NS) flush 5-10 mL: INTRAVENOUS | @ 02:00:00 | NDC 82903065462

## 2012-08-13 MED ADMIN — enoxaparin (LOVENOX) injection 40 mg: SUBCUTANEOUS | @ 02:00:00 | NDC 00075062040

## 2012-08-13 MED ADMIN — butalbital-acetaminophen-caffeine (FIORICET, ESGIC) per tablet 1 Tab: ORAL | @ 02:00:00 | NDC 68084039611

## 2012-08-13 MED FILL — PROTONIX 40 MG TABLET,DELAYED RELEASE: 40 mg | ORAL | Qty: 1

## 2012-08-13 MED FILL — ACETAMINOPHEN 325 MG TABLET: 325 mg | ORAL | Qty: 2

## 2012-08-13 MED FILL — LORAZEPAM 0.5 MG TAB: 0.5 mg | ORAL | Qty: 1

## 2012-08-13 MED FILL — FLUOXETINE 20 MG CAP: 20 mg | ORAL | Qty: 1

## 2012-08-13 MED FILL — BD POSIFLUSH NORMAL SALINE 0.9 % INJECTION SYRINGE: INTRAMUSCULAR | Qty: 10

## 2012-08-13 MED FILL — BUTALBITAL-ACETAMINOPHEN-CAFFEINE 50 MG-325 MG-40 MG TAB: 50-325-40 mg | ORAL | Qty: 1

## 2012-08-13 MED FILL — LOVENOX 40 MG/0.4 ML SUBCUTANEOUS SYRINGE: 40 mg/0.4 mL | SUBCUTANEOUS | Qty: 0.4

## 2012-08-13 NOTE — Progress Notes (Signed)
Bedside shift change report given to Albina, RN (oncoming nurse) by Becky, RN (offgoing nurse).  Report given with SBAR and Kardex.

## 2012-08-13 NOTE — Progress Notes (Signed)
Entered pt room to discuss possible discharge.  Pt became tearful and said "she needed something to help her relax because when she tries to sleep she tightens up".  I paged pastoral care and will continue to monitor.

## 2012-08-13 NOTE — Progress Notes (Signed)
Spiritual Care Assessment/Progress Notes    Akaiya Holladay [id number]  [id number]    1955/11/21  56 y.o.  female    Patient Telephone Number: [phone] (home)   Religious Affiliation: [employer]   Language: English   Extended Emergency Contact Information  Primary Emergency Contact: [facility] [place]  Home Phone: [phone]  Relation: Other Relative   Patient Active Problem List    Diagnosis Date Noted   ??? Other chest pain 08/12/2012   ??? Depression 08/12/2012   ??? Elevated liver enzymes 02/10/2012   ??? Low back pain 02/10/2012   ??? Anxiety and depression 02/10/2012   ??? Chest pain, unspecified 02/09/2012   ??? Headache 02/09/2012        Date: 08/13/2012       Level of Religious/Spiritual Activity:  [x]          Involved in faith tradition/spiritual practice    []          Not involved in faith tradition/spiritual practice  []          Spiritually oriented    []          Claims no spiritual orientation    []          seeking spiritual identity  []          Feels alienated from religious practice/tradition  []          Feels angry about religious practice/tradition  [x]          Spirituality/religious tradition is a Theatre stage manager for coping at this time.  []          Not able to assess due to medical condition    Services Provided Today:  []          crisis intervention    []          reading Scriptures  [x]          spiritual assessment    []          prayer  [x]          empathic listening/emotional support  []          rites and rituals (cite in comments)  []          life review     []          religious support  []          theological development   []          advocacy  []          ethical dialog     []          blessing  []          bereavement support    []          support to family  []          anticipatory grief support   []          help with AMD  []          spiritual guidance    []          meditation      Spiritual Care Needs  []          Emotional Support  []          Spiritual/Religious Care  []          Loss/Adjustment   []          Advocacy/Referral /Ethics  [x]          No needs expressed at this time  []   Other: (note in comments)  Spiritual Care Plan  []          Follow up visits with pt/family  []          Provide materials  []          Schedule sacraments  []          Contact [facility] Cl[facility]  [x]          Follow up as needed  []          Other: (note in comments)     Comments: Extended visit with Ms. Sosna as shared issues with which she is struggling. At the top of that list is grief over the death of her mother with whom she was extremely close according to her. It is her belief that her pain and grief is settling in her body. Affirmed the grief and pain she is struggling with is a normal response to the death of her mother,esp. with other complicating factors. Thanks for the opportunity to minister to this patient. .     Elianah [place], Stephannie Li., BCC, Chaplain
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
## 2012-08-13 NOTE — Progress Notes (Signed)
Location: [facility] - [address]  Attn.: Vanessa [name]  DOB: 02/19/1956 / Age: 56  MR#: [id number] / Admit#: [id number]  Pt. First Name: Vanessa  Pt. Last Name: [name] Vanessa [name] [facility]  [address]  [place], [place]  [address]                        Case Management - Progress Note  Initial Open Date: 08/13/2012  Case Manager: Vanessa [name]    Initial Open Date:  Social Worker: Vanessa [name]  Expected Date of Discharge:  Transferred From: home  ECF Bed Held Until:  Bed Held By:  Power of Attorney:  POA/Guardian/Conservator Capacity:  Primary Caregiver: Vanessa [name] Relative [phone]  Living Arrangements:  Source of Income:  Payee:  Psychosocial History:  Cultural/Religious/Language Issues:  Education Level:  ADLS/Current Living Arrangements Issues: Lives in a 1 story home  self-care    Past Providers:  Will patient perform self care at discharge? Y  Anticipated Discharge Disposition Goal: Return to admission address  Assessment/Plan:   08/13/2012 01:05P  Chart reviewed for medical necessity,  will follow for potential discharge needs.  Vanessa Chambers, RN  Resources at Discharge:  Service Providers at Discharge:  Dictating Provider:  Melvyn [name], Vanessa [name]

## 2012-08-13 NOTE — Progress Notes (Signed)
Patient seen and interviewed   She is not having any chest pain at present.  Vital signs are stable.  Her cardiac enzymes remain negative  Her EKGs show a stable pattern of non specific ST T changes  The patient had a normal myocardial stress perfusion imaging study in April of this year    Impression:   Non cardiac chest discomfort  No objective evidence for myocardial ischemia at this time    Recommendations:  Ok to discharge from my standpoint.  I have asked the patient to follow up with me in the office in the next 2-3 weeks.   She has my office number and address.    Thank you.  Alvira Philips, MD

## 2012-08-14 MED ADMIN — sodium chloride (NS) flush 5-10 mL: INTRAVENOUS | @ 11:00:00 | NDC 87701099893

## 2012-08-14 MED ADMIN — LORazepam (ATIVAN) tablet 0.5 mg: ORAL | @ 15:00:00 | NDC 00904598061

## 2012-08-14 MED ADMIN — sodium chloride (NS) flush 5-10 mL: INTRAVENOUS | @ 03:00:00 | NDC 87701099893

## 2012-08-14 MED ADMIN — enoxaparin (LOVENOX) injection 40 mg: SUBCUTANEOUS | @ 03:00:00 | NDC 00075062040

## 2012-08-14 MED ADMIN — pantoprazole (PROTONIX) tablet 40 mg: ORAL | @ 11:00:00 | NDC 00008060704

## 2012-08-14 MED ADMIN — FLUoxetine (PROZAC) capsule 40 mg: ORAL | @ 15:00:00 | NDC 68084060511

## 2012-08-14 MED FILL — FLUOXETINE 20 MG CAP: 20 mg | ORAL | Qty: 2

## 2012-08-14 MED FILL — BD POSIFLUSH NORMAL SALINE 0.9 % INJECTION SYRINGE: INTRAMUSCULAR | Qty: 10

## 2012-08-14 MED FILL — LORAZEPAM 0.5 MG TAB: 0.5 mg | ORAL | Qty: 1

## 2012-08-14 MED FILL — LOVENOX 40 MG/0.4 ML SUBCUTANEOUS SYRINGE: 40 mg/0.4 mL | SUBCUTANEOUS | Qty: 0.4

## 2012-08-14 NOTE — Discharge Summary (Signed)
Name:       Vanessa [name], Vanessa [name]                  Admitted:    08/12/2012                                               Discharged:  08/14/2012  Account #:  [id number]                     DOB:         [birth year]  Consultant: Dallas Breeding, MD           Age          56                                 DISCHARGE SUMMARY      HISTORY OF PRESENT ILLNESS: The patient is a 56 year old lady who presented  to the emergency room complaining of substernal pressure type pain. There  was associated diaphoresis and occasional shortness of breath, but is not  activity related. The onset was on Monday after a court appearance with her  sister and the family where she was apparently placed on probation for  unknown reasons. Since that time she has noted progressive increasing chest  discomfort, now reaching the point that it was intolerable. She had no  radiation of the discomfort. EKG and CT scans were negative for chest as  related to the latter. In view of this it was elected to admit her for  further evaluation and care.    PAST MEDICAL HISTORY, SOCIAL HISTORY, REVIEW OF SYSTEMS, FAMILY HISTORY,  PHYSICAL EXAMINATION: As in admitting H and P.    LABORATORY VALUES: Hemoglobin 15.5, white count 4.2, MCV was 95.8, platelet  count 302 k with the following differential: 56 segs, 37 lymphocytes, 4  monocytes, 2 eosinophils, 1 basophil. Abnormalities on the comprehensive  profile limited to a slight elevation of the AST of 42. Cardiac enzymes  revealed an HDL of 81 and LDL of 147 with normal triglycerides. Cardiac  enzymes were negative.    RADIOGRAPHS: CTA of the chest, negative. Chest x-ray, negative.    CONSULTATIONS: Consultation with Dr. Lavinia Sharps who felt that the chest  pain was noncardiac and suggested medical followup.    HOSPITAL COURSE: The patient was admitted and underwent a cardiac workup  via Cardiology consult. Cardiology was quite comfortable that this was  noncardiac in origin and suggested symptomatic  treatment. If there was any  change in quality of care he would be happy to see her again. She did well  during the hospital stay having no further chest discomfort. At time of  discharge she is totally asymptomatic.    FINAL DIAGNOSES  1. Atypical chest pain, musculoskeletal in origin.  2. Depression.  3. Muscle contraction headaches.  4. Primary hypertension.  5. Chronic low back pain.    DISPOSITION: The patient will be discharged home ambulatory on a regular  diet. She will follow up with her primary care physician, Dr. Carol Ada, in 2 weeks.    DISCHARGE MEDICATIONS INCLUDE THE FOLLOWING  1. Fluoxetine 40 mg daily.  2. Lorazepam 0.5 mg b.i.d. p.r.n.  3. Fioricet 1 q.8 hours p.r.n.  4. Hydrochlorothiazide 12.5 mg  daily.  5. Multivitamins daily.  6. Zanaflex 2 mg t.i.d. p.r.n.    ADDENDUM: I failed to mention the patient was also seen in consultation by  Psychiatry who felt that a medication change by increasing her fluoxetine  was the only thing needed at this point. She can follow up as an outpatient  in their clinic.        Reviewed on 08/15/2012 4:40 PM              Dallas Breeding, MD    cc:    Dallas Breeding, MD      LAB/wmx; D: 08/14/2012 09:19 A; T: 08/14/2012 10:45 A; DOC# [id number]; Job#  [id number]

## 2012-08-14 NOTE — Other (Signed)
Bedside shift change report given to yvolive rn (oncoming nurse) by Dannielle Huh rn (offgoing nurse).  Report given with SBAR.

## 2012-09-02 ENCOUNTER — Emergency Department (HOSPITAL_COMMUNITY)
Admission: EM | Admit: 2012-09-02 | Discharge: 2012-09-02 | Disposition: A | Discharge status: Home / Self Care | Payer: Self-pay | Attending: Emergency Medicine | Admitting: Emergency Medicine

## 2012-09-02 ENCOUNTER — Emergency Department (HOSPITAL_COMMUNITY): Payer: Self-pay

## 2012-09-02 ENCOUNTER — Encounter (HOSPITAL_COMMUNITY): Payer: Self-pay | Admitting: *Deleted

## 2012-09-02 DIAGNOSIS — R071 Chest pain on breathing: Secondary | ICD-10-CM | POA: Insufficient documentation

## 2012-09-02 DIAGNOSIS — R0789 Other chest pain: Secondary | ICD-10-CM

## 2012-09-02 DIAGNOSIS — Z86718 Personal history of other venous thrombosis and embolism: Secondary | ICD-10-CM | POA: Insufficient documentation

## 2012-09-02 DIAGNOSIS — F17200 Nicotine dependence, unspecified, uncomplicated: Secondary | ICD-10-CM | POA: Insufficient documentation

## 2012-09-02 LAB — COMPREHENSIVE METABOLIC PANEL
BUN: 11 mg/dL (ref 6–23)
CO2: 28 mEq/L (ref 19–32)
Calcium: 9.2 mg/dL (ref 8.4–10.5)
Chloride: 104 mEq/L (ref 96–112)
Creatinine, Ser: 1 mg/dL (ref 0.50–1.10)
GFR calc Af Amer: 73 mL/min — ABNORMAL LOW (ref >90)
GFR calc non Af Amer: 63 mL/min — ABNORMAL LOW (ref >90)
Glucose, Bld: 89 mg/dL (ref 70–99)
Total Bilirubin: 0.6 mg/dL (ref 0.3–1.2)

## 2012-09-02 LAB — CBC WITH DIFFERENTIAL/PLATELET
Eosinophils Relative: 2 % (ref 0–5)
HCT: 36.7 % (ref 36.0–46.0)
Hemoglobin: 12.3 g/dL (ref 12.0–15.0)
Lymphocytes Relative: 35 % (ref 12–46)
Lymphs Abs: 2.5 10*3/uL (ref 0.7–4.0)
MCV: 92 fL (ref 78.0–100.0)
Monocytes Absolute: 0.6 10*3/uL (ref 0.1–1.0)
Monocytes Relative: 8 % (ref 3–12)
RBC: 3.99 MIL/uL (ref 3.87–5.11)
RDW: 12.1 % (ref 11.5–15.5)
WBC: 7 10*3/uL (ref 4.0–10.5)

## 2012-09-02 MED ORDER — CYCLOBENZAPRINE HCL 10 MG PO TABS: 10.0000 mg | ORAL_TABLET | Freq: Three times a day (TID) | ORAL | Status: DC | PRN

## 2012-09-02 MED ORDER — KETOROLAC TROMETHAMINE 30 MG/ML IJ SOLN
30.0000 mg | Freq: Once | INTRAMUSCULAR | Status: AC
  Administered 2012-09-02: 30 mg via INTRAVENOUS
  Filled 2012-09-02: qty 1

## 2012-09-02 MED ORDER — IOHEXOL 350 MG/ML SOLN
100.0000 mL | Freq: Once | INTRAVENOUS | Status: AC | PRN
  Administered 2012-09-02: 100 mL via INTRAVENOUS

## 2012-09-02 MED ORDER — CYCLOBENZAPRINE HCL 10 MG PO TABS
5.0000 mg | ORAL_TABLET | Freq: Once | ORAL | Status: AC
  Administered 2012-09-02: 5 mg via ORAL
  Filled 2012-09-02 (×2): qty 1

## 2012-09-02 MED ORDER — NITROGLYCERIN 2 % TD OINT
1.0000 [in_us] | TOPICAL_OINTMENT | Freq: Once | TRANSDERMAL | Status: AC
  Administered 2012-09-02: 1 [in_us] via TOPICAL
  Filled 2012-09-02: qty 30

## 2012-09-02 MED ORDER — ASPIRIN 81 MG PO CHEW
324.0000 mg | CHEWABLE_TABLET | Freq: Once | ORAL | Status: AC
  Administered 2012-09-02: 324 mg via ORAL
  Filled 2012-09-02: qty 4

## 2012-09-02 NOTE — ED Provider Notes (Signed)
History     CSN: [id number]  Arrival date & time 09/02/12  1311   First MD Initiated Contact with Patient 09/02/12 1346      Chief Complaint  Patient presents with  . Chest Pain    (Consider location/radiation/quality/duration/timing/severity/associated sxs/prior treatment) HPI  Patient reports she's been having chest pain off and on for the past 3 weeks. She states the pain can last 1 hour up to a whole week in entirety. She states last night the chest pain started and is constant. She indicates the pain is in her right anterior chest and radiates into her back. She also describes some shortness of breath. She denies cough, fever, nausea or vomiting. She states the pain is tight and feels like "someone squeezing in women in my chest". She states she's been having this pain off and on actually for the past year but has never been evaluated. Patient reports her pain is made worse with deep breathing, movement of her arm, and changing positions. She states her pain was a 10 out of 10 earlier today and is currently an 8/10.  Patient states she was evaluated by [facility] pulmonology last year and was found to have a sleep disorder and had a sensitivity to perfumes. She states she was prescribed inhalers which she does not take this he were to extensive.  Patient denies history of coronary artery disease in her family however she states her 34 year old son has a defibrillator. She states her other son passes out when he fights.  PCP none    Past Medical History  Diagnosis Date  . DVT (deep venous thrombosis)   . Leukemia   . Pulmonary embolism   . Seasonal allergies   . Chronic headaches     Past Surgical History  Procedure Date  . Cesarean section   . Tubal ligation   . Right arm fracture     Family History  Problem Relation Age of Onset  . Emphysema Father   . Heart disease Sister   . Cancer Sister   . Breast cancer Mother    mother of patient died of cancer Father of  patient died of cancer  History  Substance Use Topics  . Smoking status: Current Every Day Smoker -- 0.4 packs/day for 40 years    Types: Cigarettes  . Smokeless tobacco: Not on file  . Alcohol Use: No  Unemployed Lives with son  OB History    Grav Para Term Preterm Abortions TAB SAB Ect Mult Living                  Review of Systems  All other systems reviewed and are negative.    Allergies  Review of patient's allergies indicates no known allergies.  Home Medications   Current Outpatient Rx  Name Route Sig Dispense Refill  . ALBUTEROL SULFATE HFA 108 (90 BASE) MCG/ACT IN AERS Inhalation Inhale 2 puffs into the lungs every 6 (six) hours as needed. For shortness of breath.      BP 131/81  Pulse 81  Temp 98.7 F (37.1 C) (Oral)  Resp 16  SpO2 95%  Vital signs normal    Physical Exam  Nursing note and vitals reviewed. Constitutional: She is oriented to person, place, and time. She appears well-developed and well-nourished.  Non-toxic appearance. She does not appear ill. No distress.  HENT:  Head: Normocephalic and atraumatic.  Right Ear: External ear normal.  Left Ear: External ear normal.  Nose: Nose normal. No mucosal edema  or rhinorrhea.  Mouth/Throat: Oropharynx is clear and moist and mucous membranes are normal. No dental abscesses or uvula swelling.  Eyes: Conjunctivae normal and EOM are normal. Pupils are equal, round, and reactive to light.  Neck: Normal range of motion and full passive range of motion without pain. Neck supple.  Cardiovascular: Normal rate, regular rhythm and normal heart sounds.  Exam reveals no gallop and no friction rub.   No murmur heard. Pulmonary/Chest: Effort normal and breath sounds normal. No respiratory distress. She has no wheezes. She has no rhonchi. She has no rales. She exhibits tenderness. She exhibits no crepitus.         Chest wall is tender to palpation and reproduces her c/o pain  Abdominal: Soft. Normal appearance  and bowel sounds are normal. She exhibits no distension. There is no tenderness. There is no rebound and no guarding.  Musculoskeletal: Normal range of motion. She exhibits no edema and no tenderness.       Moves all extremities well.   Neurological: She is alert and oriented to person, place, and time. She has normal strength. No cranial nerve deficit.  Skin: Skin is warm, dry and intact. No rash noted. No erythema. No pallor.  Psychiatric: She has a normal mood and affect. Her speech is normal and behavior is normal. Her mood appears not anxious.    ED Course  Procedures (including critical care time)   Results for orders placed during the hospital encounter of 09/02/12  CBC WITH DIFFERENTIAL      Component Value Range   WBC 7.0  4.0 - 10.5 K/uL   RBC 3.99  3.87 - 5.11 MIL/uL   Hemoglobin 12.3  12.0 - 15.0 g/dL   HCT 16.1  09.6 - 04.5 %   MCV 92.0  78.0 - 100.0 fL   MCH 30.8  26.0 - 34.0 pg   MCHC 33.5  30.0 - 36.0 g/dL   RDW 40.9  81.1 - 91.4 %   Platelets 298  150 - 400 K/uL   Neutrophils Relative 54  43 - 77 %   Neutro Abs 3.7  1.7 - 7.7 K/uL   Lymphocytes Relative 35  12 - 46 %   Lymphs Abs 2.5  0.7 - 4.0 K/uL   Monocytes Relative 8  3 - 12 %   Monocytes Absolute 0.6  0.1 - 1.0 K/uL   Eosinophils Relative 2  0 - 5 %   Eosinophils Absolute 0.2  0.0 - 0.7 K/uL   Basophils Relative 1  0 - 1 %   Basophils Absolute 0.0  0.0 - 0.1 K/uL  COMPREHENSIVE METABOLIC PANEL      Component Value Range   Sodium 138  135 - 145 mEq/L   Potassium 3.7  3.5 - 5.1 mEq/L   Chloride 104  96 - 112 mEq/L   CO2 28  19 - 32 mEq/L   Glucose, Bld 89  70 - 99 mg/dL   BUN 11  6 - 23 mg/dL   Creatinine, Ser 7.82  0.50 - 1.10 mg/dL   Calcium 9.2  8.4 - 95.6 mg/dL   Total Protein 6.9  6.0 - 8.3 g/dL   Albumin 3.2 (*) 3.5 - 5.2 g/dL   AST 18  0 - 37 U/L   ALT 19  0 - 35 U/L   Alkaline Phosphatase 106  39 - 117 U/L   Total Bilirubin 0.6  0.3 - 1.2 mg/dL   GFR calc non Af Amer 63 (*) >90  mL/min    GFR calc Af Amer 73 (*) >90 mL/min  TROPONIN I      Component Value Range   Troponin I <0.30  <0.30 ng/mL   Laboratory interpretation all normal except    Dg Chest 2 View  09/02/2012  *RADIOLOGY REPORT*  Clinical Data: Right-sided chest pain, shortness of breath  CHEST - 2 VIEW  Comparison: CT chest of 12/24/2010 and chest x-ray of the same date  Findings:  No active infiltrate or effusion is seen.  Mediastinal contours appear stable.  The heart is within normal limits in size. Old right upper posterior rib fractures are stable.  IMPRESSION: Stable chest x-ray.  No active lung disease.   Original Report Authenticated By: Dwyane Dee, M.D.    Ct Angio Chest W/cm &/or Wo Cm  09/02/2012  *RADIOLOGY REPORT*  Clinical Data: Chest pain and shortness of breath.  CT ANGIOGRAPHY CHEST  Technique:  Multidetector CT imaging of the chest using the standard protocol during bolus administration of intravenous contrast. Multiplanar reconstructed images including MIPs were obtained and reviewed to evaluate the vascular anatomy.  Contrast: OMNIPAQUE IOHEXOL 350 MG/ML SOLN  Comparison: Chest c t 12/24/2010.  Findings:  Mediastinum: There are no filling defects within the pulmonary arterial tree to suggest underlying pulmonary embolism. Heart size is normal. There is no significant pericardial fluid, thickening or pericardial calcification. No pathologically enlarged mediastinal or hilar lymph nodes. Esophagus is unremarkable in appearance.  Lungs/Pleura: Dependent areas of ground-glass attenuation are noted in the lower lobes of the lungs bilaterally, favored to reflect some underlying atelectasis.  No definite acute consolidative airspace disease.  No pleural effusions.  No suspicious appearing pulmonary nodules or masses are identified.  Upper Abdomen: Unremarkable.  Musculoskeletal: There are no aggressive appearing lytic or blastic lesions noted in the visualized portions of the skeleton. Multiple old healed  right-sided rib fractures posteriorly and posterolaterally.  IMPRESSION: 1.  No evidence of pulmonary embolism. 2.  No acute findings in the thorax to account for the patient's symptoms. 3.  Multiple healed right-sided rib fractures.   Original Report Authenticated By: Trudie Reed, M.D.     Date: 09/02/2012  Rate: 76  Rhythm: normal sinus rhythm  QRS Axis: normal  Intervals: normal  ST/T Wave abnormalities: normal  Conduction Disutrbances:none  Narrative Interpretation:   Old EKG Reviewed: unchanged from 12/24/2010   1. Chest wall pain    New Prescriptions   CYCLOBENZAPRINE (FLEXERIL) 10 MG TABLET    Take 1 tablet (10 mg total) by mouth 3 (three) times daily as needed for muscle spasms.   Ibuprofen 600 mg QID  Plan discharge  Devoria Albe, MD, FACEP    MDM          Ward Givens, MD 09/02/12 1640

## 2012-09-02 NOTE — ED Notes (Signed)
Pt reports intermittent right sided chest pain 8/10 that radiates to back. Pt reports "feels like someone is squeezing a lemon". Pt also reports a headache and SOB.

## 2012-09-02 NOTE — ED Notes (Signed)
MD at bedside. 

## 2012-09-24 LAB — HM COLONOSCOPY

## 2013-02-16 NOTE — ED Notes (Signed)
Patient self ambulatory to bathroom to provide urine specimen.  Gait steady. NO gait abnormality noted.

## 2013-02-16 NOTE — ED Notes (Addendum)
Patient is NIH stroke scale negative.  Per patient, her symptoms have been going on over three weeks.  Describes pins and needles sensation on left side, with intermittent pain.  Denies weakness at this time.  Dr. Marella Bile at bedside assessing.  Patient remains on monitor X 3.  Patient does not appear to be in acute distress.  Call bell in reach.

## 2013-02-16 NOTE — ED Notes (Signed)
Patient discharged by MD. Results and discharge instructions reviewed with the patient by MD.  Patient verbalizes understanding.  Patient discharged home, stable, ambulatory, in no acute distress.

## 2013-02-16 NOTE — ED Notes (Signed)
Triage Note: Pt states that she has been experiencing increasing pain and numbness in her L neck/arm/upper back/leg; denies any recent trauma or injury; hypertensive in triage; also complains of severe HA beginning today; +PMS in upper extremities bilaterally; no apparent facial droop or changes noted; Pt ambulatory to triage room with steady gait

## 2013-02-16 NOTE — ED Notes (Signed)
Patient requesting pain medication for left sided "throbbing" pain.  MD aware.  New orders received for motrin.

## 2013-02-16 NOTE — ED Notes (Signed)
All test results are back.  MD made aware at this time.  Patient awaiting MD re-eval.

## 2013-02-16 NOTE — ED Provider Notes (Signed)
HPI Comments: 57 y.o.female with pmh of HTN presents to the ED complaining of left sided paresthesias and neck/back pain.  Pt states that for the past two months she has been having gradually worsening paresthesia of her left arm and leg that worsened significantly tonight while at work- stating "I couldn't move my left side at all".  Pt admits she was able to lift it but that it was painful, and was experiencing "sticks to my hands and legs".  She reports that she was seen by her PCP two months ago when symptoms had first began and that she had mentioned it to him, having blood work drawn that day and not returning to see him since.  She is currently taken Prozac and one HTN medication, stating she stopped taking one of them after her last visit with him because it didn't make her feel well.  Along with her left sided paresthesias she reports sporadic episodes of pain to her bilateral eyes that "feel like I'm being stabbed with a knife", pain to localized areas on her leg that "feel like my leg is on fire", and pain in her neck and back- currently rating it 10/10 in intensity.  She admits that since she began taking Prozac and her HTN medication she has begun feeling "horrible".  When she called Dr. Oneida Alar today he advised her to come into the ED for evaluation.  She denies any fever, nausea, vomiting, vision changes, difficulty speaking, abdominal pain, or any other acute medical problems.    Social hx: Non-smoker, - etoh use  Family hx: Mother had a "massive" stroke at 9.  PCP: Cristine Polio, MD    Note written by Sherrye Payor, Scribe, as dictated by Thornton Papas, MD 8:43 PM      The history is provided by the patient.        Past Medical History   Diagnosis Date   ??? Hypertension         Past Surgical History   Procedure Laterality Date   ??? Hx gyn       tubal ligation         No family history on file.     History     Social History   ??? Marital Status: MARRIED     Spouse Name: N/A     Number of  Children: N/A   ??? Years of Education: N/A     Occupational History   ??? Not on file.     Social History Main Topics   ??? Smoking status: Never Smoker    ??? Smokeless tobacco: Never Used   ??? Alcohol Use: No   ??? Drug Use: No   ??? Sexually Active: Yes -- Female partner(s)     Birth Control/ Protection: None     Other Topics Concern   ??? Not on file     Social History Narrative   ??? No narrative on file                  ALLERGIES: Review of patient's allergies indicates no known allergies.      Review of Systems   Constitutional: Negative for fever.   HENT: Positive for neck pain.    Eyes: Negative for visual disturbance.   Respiratory: Negative for shortness of breath.    Gastrointestinal: Negative for nausea, vomiting, abdominal pain and diarrhea.   Musculoskeletal: Positive for back pain.   Neurological: Positive for weakness (General left sided) and numbness (Paresthesia of left  arm and leg). Negative for syncope and speech difficulty.   All other systems reviewed and are negative.        Filed Vitals:    02/16/13 1949 02/16/13 1950 02/16/13 2011   BP:  183/100 147/89   Pulse:  75 74   Temp:  97.5 ??F (36.4 ??C)    Resp:  16    Height: 5\' 7"  (1.702 m)     Weight: 68.04 kg (150 lb)     SpO2:  98%             Physical Exam   Nursing note and vitals reviewed.  Constitutional: She is oriented to person, place, and time. She appears well-developed and well-nourished. No distress.   HENT:   Head: Normocephalic and atraumatic.   Right Ear: External ear normal.   Left Ear: External ear normal.   Nose: Nose normal.   Mouth/Throat: Oropharynx is clear and moist.   Eyes: Conjunctivae and EOM are normal. Pupils are equal, round, and reactive to light. No scleral icterus.   Neck: Neck supple. No JVD present. No tracheal deviation present. No thyromegaly present.   No carotid bruits    Cardiovascular: Normal rate, regular rhythm and normal heart sounds.  Exam reveals no gallop and no friction rub.    No murmur heard.  Pulmonary/Chest:  Effort normal and breath sounds normal. No respiratory distress. She has no wheezes. She has no rales. She exhibits no tenderness.   Abdominal: Soft. Bowel sounds are normal. She exhibits no distension and no mass. There is no tenderness. There is no rebound and no guarding.   Musculoskeletal: Normal range of motion. She exhibits no edema and no tenderness.   Lymphadenopathy:     She has no cervical adenopathy.   Neurological: She is alert and oriented to person, place, and time. No cranial nerve deficit. Coordination normal.   Skin: Skin is warm and dry. No rash noted. She is not diaphoretic. No erythema.   Psychiatric: She has a normal mood and affect. Her behavior is normal. Judgment and thought content normal.        MDM     Differential Diagnosis; Clinical Impression; Plan:     Impression: Left-sided subjective paresthesias without associated weakness, headache, and several other somatic complaints. Patient is on several medications for both her depression as well as hypertension and she stopped one or both medications due to her subjective sense of not making her  Any better and actually make her feel worse.    The patient will undergo a code stroke and continued workup as necessary. The symptoms have been present for 2-3 weeks and there does not appear to be any new precipitating factor that caused the ED evaluation today. I've discussed the case with Dr. Oneida Alar, in essence he wanted to ensure that her head CT scan  Physical examination and laboratory findings today were all within normal limits and there was no other new acute findings. His feeling is that the patient may be having a conversion reaction, and certainly there is no acute findings for an organic cause. Thus we'll discharge home to continue her routine medications and followup with both Dr. Oneida Alar and her psychiatrist.      Procedures    ED EKG interpretation:  Rhythm: normal sinus rhythm; and regular . Rate (approx.): 63; Axis: normal; P  wave: normal; QRS interval: normal ; ST/T wave: non-specific changes; Other findings: normal. This EKG was interpreted by Thornton Papas, MD,ED Provider.  9:50 PM  Thornton Papas, MD spoke with Dr. Oneida Alar, Consult for Medicine. Discussed available diagnostic tests and clinical findings. He/She is in agreement with care plans as outlined.

## 2013-02-16 NOTE — ED Notes (Signed)
Patient updated on plan of care.  Call bell in reach.  Patient comfortable at this time.

## 2013-02-17 LAB — METABOLIC PANEL, COMPREHENSIVE
A-G Ratio: 1 — ABNORMAL LOW (ref 1.1–2.2)
ALT (SGPT): 56 U/L (ref 12–78)
AST (SGOT): 30 U/L (ref 15–37)
Albumin: 3.9 g/dL (ref 3.5–5.0)
Alk. phosphatase: 90 U/L (ref 50–136)
Anion gap: 5 mmol/L (ref 5–15)
BUN/Creatinine ratio: 23 — ABNORMAL HIGH (ref 12–20)
BUN: 13 MG/DL (ref 6–20)
Bilirubin, total: <0.1 MG/DL — ABNORMAL LOW (ref 0.2–1.0)
CO2: 26 mmol/L (ref 21–32)
Calcium: 8.9 MG/DL (ref 8.5–10.1)
Chloride: 108 mmol/L (ref 97–108)
Creatinine: 0.57 MG/DL (ref 0.45–1.15)
GFR est AA: >60 mL/min/{1.73_m2} (ref >60)
GFR est non-AA: >60 mL/min/{1.73_m2} (ref >60)
Globulin: 4.1 g/dL — ABNORMAL HIGH (ref 2.0–4.0)
Glucose: 92 mg/dL (ref 65–100)
Potassium: 3.8 mmol/L (ref 3.5–5.1)
Protein, total: 8 g/dL (ref 6.4–8.2)
Sodium: 139 mmol/L (ref 136–145)

## 2013-02-17 LAB — URINALYSIS W/ RFLX MICROSCOPIC
Bilirubin: NEGATIVE
Blood: NEGATIVE
Glucose: NEGATIVE mg/dL
Ketone: NEGATIVE mg/dL
Nitrites: NEGATIVE
Protein: NEGATIVE mg/dL
Specific gravity: 1.015 (ref 1.003–1.030)
Urobilinogen: 0.2 EU/dL (ref 0.2–1.0)
pH (UA): 6.5 (ref 5.0–8.0)

## 2013-02-17 LAB — PROTHROMBIN TIME + INR
INR: 1 (ref 0.9–1.1)
Prothrombin time: 10 s (ref 9.4–11.7)

## 2013-02-17 LAB — CBC WITH AUTOMATED DIFF
ABS. BASOPHILS: 0 10*3/uL (ref 0.0–0.1)
ABS. EOSINOPHILS: 0.2 10*3/uL (ref 0.0–0.4)
ABS. LYMPHOCYTES: 2.8 10*3/uL (ref 0.8–3.5)
ABS. MONOCYTES: 0.4 10*3/uL (ref 0.0–1.0)
ABS. NEUTROPHILS: 3.8 10*3/uL (ref 1.8–8.0)
BASOPHILS: 1 % (ref 0–1)
EOSINOPHILS: 2 % (ref 0–7)
HCT: 42.1 % (ref 35.0–47.0)
HGB: 14.3 g/dL (ref 11.5–16.0)
LYMPHOCYTES: 39 % (ref 12–49)
MCH: 32.1 PG (ref 26.0–34.0)
MCHC: 34 g/dL (ref 30.0–36.5)
MCV: 94.4 FL (ref 80.0–99.0)
MONOCYTES: 5 % (ref 5–13)
NEUTROPHILS: 53 % (ref 32–75)
PLATELET: 366 10*3/uL (ref 150–400)
RBC: 4.46 M/uL (ref 3.80–5.20)
RDW: 13.8 % (ref 11.5–14.5)
WBC: 7.2 10*3/uL (ref 3.6–11.0)

## 2013-02-17 LAB — PTT: aPTT: 27.3 s (ref 23.0–30.0)

## 2013-02-17 MED ADMIN — ibuprofen (MOTRIN) tablet 800 mg: ORAL | @ 02:00:00 | NDC 68084065811

## 2013-02-18 LAB — EKG, 12 LEAD, INITIAL
Atrial Rate: 63 {beats}/min
Calculated P Axis: 61 degrees
Calculated R Axis: 7 degrees
Calculated T Axis: 9 degrees
Diagnosis: NORMAL
P-R Interval: 140 ms
Q-T Interval: 372 ms
QRS Duration: 78 ms
QTC Calculation (Bezet): 380 ms
Ventricular Rate: 63 {beats}/min

## 2013-03-03 ENCOUNTER — Emergency Department (HOSPITAL_COMMUNITY)
Admission: EM | Admit: 2013-03-03 | Discharge: 2013-03-03 | Disposition: A | Discharge status: Home / Self Care | Payer: Self-pay | Attending: Emergency Medicine | Admitting: Emergency Medicine

## 2013-03-03 ENCOUNTER — Encounter (HOSPITAL_COMMUNITY): Payer: Self-pay | Admitting: Emergency Medicine

## 2013-03-03 DIAGNOSIS — Z86718 Personal history of other venous thrombosis and embolism: Secondary | ICD-10-CM | POA: Insufficient documentation

## 2013-03-03 DIAGNOSIS — Z86711 Personal history of pulmonary embolism: Secondary | ICD-10-CM | POA: Insufficient documentation

## 2013-03-03 DIAGNOSIS — F17200 Nicotine dependence, unspecified, uncomplicated: Secondary | ICD-10-CM | POA: Insufficient documentation

## 2013-03-03 DIAGNOSIS — Z856 Personal history of leukemia: Secondary | ICD-10-CM | POA: Insufficient documentation

## 2013-03-03 DIAGNOSIS — J309 Allergic rhinitis, unspecified: Secondary | ICD-10-CM | POA: Insufficient documentation

## 2013-03-03 DIAGNOSIS — R51 Headache: Secondary | ICD-10-CM | POA: Insufficient documentation

## 2013-03-03 DIAGNOSIS — R21 Rash and other nonspecific skin eruption: Secondary | ICD-10-CM | POA: Insufficient documentation

## 2013-03-03 MED ORDER — NYSTATIN 100000 UNIT/GM EX CREA: TOPICAL_CREAM | CUTANEOUS | Status: DC

## 2013-03-03 NOTE — ED Provider Notes (Signed)
History     CSN: [id number]  Arrival date & time 03/03/13  4098   First MD Initiated Contact with Patient 03/03/13 [phone]      Chief Complaint  Patient presents with  . Rash    (Consider location/radiation/quality/duration/timing/severity/associated sxs/prior treatment) HPI Comments: 57 year old female presents to the emergency department complaining of a rash under bilateral breasts times one month. Patient states the rash has been improving over the past couple weeks with application of baby powder. Rash is itchy. Denies redness or swelling around the area. No discharge. Denies fever or chills. She is not diabetic.  Patient is a 57 y.o. female presenting with rash. The history is provided by the patient.  Rash   Past Medical History  Diagnosis Date  . DVT (deep venous thrombosis)   . Leukemia   . Pulmonary embolism   . Seasonal allergies   . Chronic headaches     Past Surgical History  Procedure Laterality Date  . Cesarean section    . Tubal ligation    . Right arm fracture      Family History  Problem Relation Age of Onset  . Emphysema Father   . Heart disease Sister   . Cancer Sister   . Breast cancer Mother     History  Substance Use Topics  . Smoking status: Current Every Day Smoker -- 0.40 packs/day for 40 years    Types: Cigarettes  . Smokeless tobacco: Not on file  . Alcohol Use: No    OB History   Grav Para Term Preterm Abortions TAB SAB Ect Mult Living                  Review of Systems  Skin: Positive for rash.  All other systems reviewed and are negative.    Allergies  Review of patient's allergies indicates no known allergies.  Home Medications   Current Outpatient Rx  Name  Route  Sig  Dispense  Refill  . acetaminophen (TYLENOL) 500 MG tablet   Oral   Take 1,000 mg by mouth every 6 (six) hours as needed for pain.           BP 116/79  Pulse 88  Temp(Src) 97.4 F (36.3 C) (Oral)  Resp 18  SpO2 97%  Physical Exam   Nursing note and vitals reviewed. Constitutional: She is oriented to person, place, and time. She appears well-developed. No distress.  Obese  HENT:  Head: Normocephalic and atraumatic.  Mouth/Throat: Oropharynx is clear and moist.  Eyes: Conjunctivae are normal.  Neck: Normal range of motion. Neck supple.  Cardiovascular: Normal rate, regular rhythm and normal heart sounds.   Pulmonary/Chest: Effort normal and breath sounds normal.  Musculoskeletal: Normal range of motion. She exhibits no edema.  Neurological: She is alert and oriented to person, place, and time.  Skin: Skin is warm.  Scattered erythematous fungal moist rash under bilateral breasts. No drainage. No signs of secondary infection. Nontender.  Psychiatric: She has a normal mood and affect. Her behavior is normal.    ED Course  Procedures (including critical care time)  Labs Reviewed - No data to display No results found.   1. Skin rash       MDM  57 year old female with fungal rash. No signs of secondary infection. Prescribed nystatin cream. Advised her to keep area dry. Return precautions discussed. Patient states understanding of plan and is agreeable.        Trevor Mace, PA-C 03/03/13 [phone]

## 2013-03-03 NOTE — ED Provider Notes (Signed)
Medical screening examination/treatment/procedure(s) were performed by non-physician practitioner and as supervising physician I was immediately available for consultation/collaboration.  Flint Melter, MD 03/03/13 1710

## 2013-03-03 NOTE — ED Notes (Signed)
Pt here for rash under left breast that is itching x 1 month

## 2013-03-15 ENCOUNTER — Ambulatory Visit: Payer: Self-pay

## 2013-04-06 ENCOUNTER — Encounter

## 2013-04-09 MED ADMIN — gadobutrol (Gadavist) contrast solution 7 mL: INTRAVENOUS | @ 01:00:00 | NDC 50419032501

## 2013-04-09 MED FILL — GADAVIST 7.5 MMOL/7.5 ML (1 MMOL/ML) INTRAVENOUS SOLUTION: 7.5 mmol/ mL (1 mmol/mL) | INTRAVENOUS | Qty: 7.5

## 2013-04-20 ENCOUNTER — Encounter

## 2013-04-28 MED ADMIN — gadobutrol (Gadavist) contrast solution 7 mL: INTRAVENOUS | @ 01:00:00 | NDC 50419032501

## 2013-04-28 MED FILL — GADAVIST 7.5 MMOL/7.5 ML (1 MMOL/ML) INTRAVENOUS SOLUTION: 7.5 mmol/ mL (1 mmol/mL) | INTRAVENOUS | Qty: 7.5

## 2013-06-06 ENCOUNTER — Emergency Department (HOSPITAL_COMMUNITY)
Admission: EM | Admit: 2013-06-06 | Discharge: 2013-06-06 | Disposition: A | Discharge status: Home / Self Care | Payer: Self-pay | Attending: Emergency Medicine | Admitting: Emergency Medicine

## 2013-06-06 ENCOUNTER — Encounter (HOSPITAL_COMMUNITY): Payer: Self-pay | Admitting: Emergency Medicine

## 2013-06-06 DIAGNOSIS — I82409 Acute embolism and thrombosis of unspecified deep veins of unspecified lower extremity: Secondary | ICD-10-CM | POA: Insufficient documentation

## 2013-06-06 DIAGNOSIS — Z86711 Personal history of pulmonary embolism: Secondary | ICD-10-CM | POA: Insufficient documentation

## 2013-06-06 DIAGNOSIS — G8929 Other chronic pain: Secondary | ICD-10-CM | POA: Insufficient documentation

## 2013-06-06 DIAGNOSIS — Z7901 Long term (current) use of anticoagulants: Secondary | ICD-10-CM | POA: Insufficient documentation

## 2013-06-06 DIAGNOSIS — I82401 Acute embolism and thrombosis of unspecified deep veins of right lower extremity: Secondary | ICD-10-CM

## 2013-06-06 DIAGNOSIS — Z856 Personal history of leukemia: Secondary | ICD-10-CM | POA: Insufficient documentation

## 2013-06-06 DIAGNOSIS — F17200 Nicotine dependence, unspecified, uncomplicated: Secondary | ICD-10-CM | POA: Insufficient documentation

## 2013-06-06 DIAGNOSIS — M7989 Other specified soft tissue disorders: Secondary | ICD-10-CM

## 2013-06-06 DIAGNOSIS — M2550 Pain in unspecified joint: Secondary | ICD-10-CM | POA: Insufficient documentation

## 2013-06-06 DIAGNOSIS — M79609 Pain in unspecified limb: Secondary | ICD-10-CM

## 2013-06-06 LAB — D-DIMER, QUANTITATIVE: D-Dimer, Quant: 1.44 ug/mL-FEU — ABNORMAL HIGH (ref 0.00–0.48)

## 2013-06-06 MED ORDER — ENOXAPARIN SODIUM 40 MG/0.4ML ~~LOC~~ SOLN: 90.0000 mg | SUBCUTANEOUS | Status: DC

## 2013-06-06 MED ORDER — WARFARIN SODIUM 5 MG PO TABS: 5.0000 mg | ORAL_TABLET | Freq: Every day | ORAL | Status: DC

## 2013-06-06 MED ORDER — ENOXAPARIN SODIUM 100 MG/ML ~~LOC~~ SOLN
85.0000 mg | Freq: Once | SUBCUTANEOUS | Status: AC
  Administered 2013-06-06: 85 mg via SUBCUTANEOUS
  Filled 2013-06-06: qty 1

## 2013-06-06 MED ORDER — OXYCODONE-ACETAMINOPHEN 5-325 MG PO TABS
2.0000 | ORAL_TABLET | Freq: Once | ORAL | Status: AC
  Administered 2013-06-06: 2 via ORAL
  Filled 2013-06-06: qty 2

## 2013-06-06 MED ORDER — ENOXAPARIN SODIUM 40 MG/0.4ML ~~LOC~~ SOLN: 90.0000 mg | Freq: Two times a day (BID) | SUBCUTANEOUS | Status: DC

## 2013-06-06 MED ORDER — WARFARIN SODIUM 5 MG PO TABS
5.0000 mg | ORAL_TABLET | Freq: Once | ORAL | Status: AC
  Administered 2013-06-06: 5 mg via ORAL
  Filled 2013-06-06: qty 1

## 2013-06-06 MED ORDER — ACETAMINOPHEN 325 MG PO TABS
650.0000 mg | ORAL_TABLET | Freq: Once | ORAL | Status: AC
  Administered 2013-06-06: 650 mg via ORAL
  Filled 2013-06-06: qty 2

## 2013-06-06 MED ORDER — HYDROCODONE-ACETAMINOPHEN 5-325 MG PO TABS: 2.0000 | ORAL_TABLET | ORAL | Status: DC | PRN

## 2013-06-06 MED ORDER — WARFARIN - PHYSICIAN DOSING INPATIENT: Freq: Every day | Status: DC

## 2013-06-06 MED ORDER — ENOXAPARIN SODIUM 40 MG/0.4ML ~~LOC~~ SOLN: 40.0000 mg | SUBCUTANEOUS | Status: DC

## 2013-06-06 NOTE — ED Notes (Signed)
Pt reports 5 day hx of r/calf pain radiating to r/buttock. Pt notes intermittent pain in l/calf

## 2013-06-06 NOTE — ED Notes (Signed)
RLE Venous Duplex completed.

## 2013-06-06 NOTE — Progress Notes (Signed)
Patient confirmed she did not have a pcp or insurance. Patient to be discharged on lovenox 90 bid.  Pharmacy to provide two day supply and will attempt to [facility] the other 3 day supply on 06/07/2013.  EDCM tried to call [facility] to check a price on 90mg  bid lovenox injection for three day supply but [facility].  [facility].  [facility] lovenox RX will cost [phone].  Patient stated she will get her RX filled at the [facility] on [address] in [place].  Explained to patient that with the [facility] program she only has seven days to fill her prescription, otherwise this program will not work.  Patient verbalized understanding.  EDCM will also make an appointment for patient to see a doctor at the [facility] in one week as per Dr. Claudean Kinds.  Patient will also need INR drawn.  [facility] provided patient list of pcps who accept self pay patients and explained to patient that the [facility] can be her pcp if she chooses them.  Patient verbalized understanding.  No further needs at this time. Patient thankful for information.

## 2013-06-06 NOTE — ED Notes (Addendum)
Pt alert and oriented x4. Respirations even and unlabored, bilateral symmetrical rise and fall of chest. Skin warm and dry. In no acute distress. Denies needs.   

## 2013-06-06 NOTE — Progress Notes (Signed)
VASCULAR LAB PRELIMINARY  PRELIMINARY  PRELIMINARY  PRELIMINARY  Right lower extremity venous Doppler completed.    Preliminary report:  There is acute DVT noted in the right posterior tibial and peroneal veins.  There is extremely sluggish flow noted in the popliteal, femoral and common veins.  The right common femoral vein is dilated. No propagation noted to the left lower extremity.  Florencia Zaccaro, RVT 06/06/2013, 8:17 PM

## 2013-06-06 NOTE — ED Notes (Signed)
md made aware pts legs are still in pain

## 2013-06-06 NOTE — Progress Notes (Signed)
06/06/2013 A. Jama Mcmiller RNCM [facility] has emailed [name] [facility] to make an appointment for her to become an established patient and to have her INR drawn in one week.

## 2013-06-06 NOTE — ED Provider Notes (Addendum)
CSN: [id number]     Arrival date & time 06/06/13  1349 History     First MD Initiated Contact with Patient 06/06/13 1404     Chief Complaint  Patient presents with  . Leg Pain    5 day hx of r/and l/calf pain    HPI   57 y/o F with prior DVT/PE 2008.  ? Etiology per pt, but continues to smoke.  Coumadin for 1 year, stopped, no recurrence.  RLE pain in Posterior calf for 1 wee,  Limps, radiates to posterior gluteal, and medial upper leg.  No sob, cough, hemoptysis, cp. Past Medical History  Diagnosis Date  . DVT (deep venous thrombosis)   . Leukemia   . Pulmonary embolism   . Seasonal allergies   . Chronic headaches    Past Surgical History  Procedure Laterality Date  . Cesarean section    . Tubal ligation    . Right arm fracture     Family History  Problem Relation Age of Onset  . Emphysema Father   . Heart disease Sister   . Cancer Sister   . Breast cancer Mother    History  Substance Use Topics  . Smoking status: Current Every Day Smoker -- 0.40 packs/day for 40 years    Types: Cigarettes  . Smokeless tobacco: Not on file  . Alcohol Use: No   OB History   Grav Para Term Preterm Abortions TAB SAB Ect Mult Living                 Review of Systems  Constitutional: Negative for fever, chills, diaphoresis, appetite change and fatigue.  HENT: Negative for sore throat, mouth sores and trouble swallowing.   Eyes: Negative for visual disturbance.  Respiratory: Negative for cough, chest tightness, shortness of breath and wheezing.   Cardiovascular: Negative for chest pain and leg swelling.  Gastrointestinal: Negative for nausea, vomiting, abdominal pain, diarrhea and abdominal distention.  Endocrine: Negative for polydipsia, polyphagia and polyuria.  Genitourinary: Negative for dysuria, frequency and hematuria.  Musculoskeletal: Positive for arthralgias. Negative for back pain and gait problem.       Pain post calf to buttox and rt groin.  Skin: Negative for color  change, pallor and rash.  Neurological: Negative for dizziness, syncope, light-headedness and headaches.       Nml strength and sensation to RLE/LLE, symmetric.  Hematological: Does not bruise/bleed easily.  Psychiatric/Behavioral: Negative for behavioral problems and confusion.    Allergies  Review of patient's allergies indicates no known allergies.  Home Medications   Current Outpatient Rx  Name  Route  Sig  Dispense  Refill  . acetaminophen (TYLENOL) 500 MG tablet   Oral   Take 1,000 mg by mouth every 6 (six) hours as needed for pain.         Marland Kitchen enoxaparin (LOVENOX) 40 MG/0.4ML injection   Subcutaneous   Inject 0.9 mLs (90 mg total) into the skin daily.   10 Syringe   0   . enoxaparin (LOVENOX) 40 MG/0.4ML injection   Subcutaneous   Inject 0.4 mLs (40 mg total) into the skin daily.   4 Syringe   0   . enoxaparin (LOVENOX) 40 MG/0.4ML injection   Subcutaneous   Inject 0.9 mLs (90 mg total) into the skin every 12 (twelve) hours.   4 Syringe   0   . enoxaparin (LOVENOX) 40 MG/0.4ML injection   Subcutaneous   Inject 0.9 mLs (90 mg total) into the skin daily.  6 Syringe   0   . HYDROcodone-acetaminophen (NORCO/VICODIN) 5-325 MG per tablet   Oral   Take 2 tablets by mouth every 4 (four) hours as needed for pain.   10 tablet   0   . warfarin (COUMADIN) 5 MG tablet   Oral   Take 1 tablet (5 mg total) by mouth daily.   30 tablet   0   . warfarin (COUMADIN) 5 MG tablet   Oral   Take 1 tablet (5 mg total) by mouth daily.   30 tablet   0    BP 132/64  Pulse 64  Temp(Src) 97.6 F (36.4 C) (Oral)  Resp 18  Ht 5\' 2"  (1.575 m)  Wt 185 lb (83.915 kg)  BMI 33.83 kg/m2  SpO2 95% Physical Exam  Constitutional:  Pleasant no acute distress conversant  HENT:  Conjunctiva not pale sclera anicteric  Cardiovascular:  Not tachycardic  Pulmonary/Chest:  Not  tachypneic clear lungs  Lymphadenopathy:   Right lower extremity 3 cm greater circumference versus  left. No palpable or dominant areas of varicosities. No warmth or cellulitic changes  Skin:  Skin appears normal    ED Course   Procedures (including critical care time)  Labs Reviewed  D-DIMER, QUANTITATIVE - Abnormal; Notable for the following:    D-Dimer, Quant 1.44 (*)    All other components within normal limits   No results found. 1. DVT (deep venous thrombosis), right     MDM  + PT and peroneal DVT.  Given Lovenox90 mg Here.  Pt seen by care manager, and multiple interventions ongoing:  [facility] pharmacy approving 4 doses of Lovenox.  Wellness clinic referral.  Claudean Kinds, MD 06/06/13 2123  Claudean Kinds, MD 07/07/13 [phone]

## 2013-06-06 NOTE — ED Notes (Signed)
Pt alert and oriented x4. Respirations even and unlabored, bilateral symmetrical rise and fall of chest. Skin warm and dry. In no acute distress. Denies needs.   

## 2013-06-07 NOTE — Progress Notes (Signed)
ED CM checked PDMI pt not found therefore is a candidate for [facility] MATCH program CM entered information in PDMI and composed MATCH letter as below   CM faxed letter with confirmation received at 1127 am Cm spoke with pt at [phone] at 1138 to remind her to which pharmacy to contact prior to pick up Pt reports her son will be picking up the medication after he gets off work today Psychologist, educational of services rendered

## 2013-06-07 NOTE — Progress Notes (Signed)
                  Dear ______[name]__________________:  Bonita Quin have been approved to have your discharge prescriptions filled through our [facility] (Medication Assistance Through [facility]) program. This program allows for a one-time (no refills) 34-day supply of selected medications for a low copay amount.  The copay is $3.00 per prescription. For instance, if you have one prescription, you will pay $3.00; for two prescriptions, you pay $6.00; for three prescriptions, you pay $9.00; and so on.  Only certain pharmacies are participating in this program with [facility]. You will need to select one of the pharmacies from the attached list and take your prescriptions, this letter, and your photo ID to one of the participating pharmacies.   We are excited that you are able to use the [facility] program to get your medications. These prescriptions must be filled within 7 days of hospital discharge or they will no longer be valid for the [facility] program. Should you have any problems with your prescriptions please contact your case management team member at [phone].  Thank you,   [facility]   Participating [facility]acies  [facility]   [facility] [id number] [place], [place]   [facility] [address] [place], [place]   [facility] [address] [place], [place]   [facility]   [address], [place], [place]   [address] [place], [place], [place]   [address] [place], [place]   [address], [place], [place]   [address] [place], [place]   [address], [place], [place]   [address], [place], [place]   [address], [place], [place]   [address] [place], [place]   [address] [place], [place], [place]  [facility]   [address], [place], [place]   [address] [place] [place], [place]   [address] [place], [place], [place]   [address] [place], [place]   [address] [place], [place]   [address] [place] [address] [place], [place], [place] [facility]   [address] [place], [place]   [address] [employer], [place], [place]   [address], [place], [place]   [address], [place], [place]   [address], [place], [place]   [address], [place], [place]   [address] [place], [place]   [address], [place], [place]   [address], [place], [place]   [address], [place], [place]   [address], [place], [place]   [address], [place], [place]   [address], [place], [place]   [address] [place], [place], [place]  Independent Pharmacies   [facility] [address] [place], [address] [place], [place]   [facility] [address] [place], [place]   [facility] [address] [place], [place]   For continued medication needs, please contact the [facility] at  [phone].

## 2013-06-07 NOTE — Progress Notes (Signed)
[facility] Cm contacted by [facility] pharmacist , Marcelino Duster to state that The EDP from 06/06/13,  Claudean Kinds ( 2014 new EDP) noted with PDMI entry issues. [facility] CM leadership contacted, recommended new Rx from another EDP. Dr Earna Coder able to assist. Dr Blinda Leatherwood made aware of issues/concerns and pt info. MATCH entry and PDMI entry at [address] pharmacy completed Pt family available to receive Lovenox 90 mg bid x 3 days and coumadin 5 mg daily (called in Rx) CM signing off

## 2013-06-13 NOTE — Progress Notes (Signed)
[facility] ED CM received a voice message at 1639 on her office line from pt [phone]) stating she had several calls and was returning the call.  Pt reports contacting the adult care center and provided with the earliest appointment available on June 21 2013 at 1430 to get her labs checked States she is doing well Cm discussed with the pt that if she has further symptoms to contact the adult care center to request an earlier appointment after informing them of the symptoms she stated she would do so.  Evening Cm updated

## 2013-06-17 ENCOUNTER — Other Ambulatory Visit: Payer: Self-pay | Admitting: Obstetrics and Gynecology

## 2013-06-17 DIAGNOSIS — Z1231 Encounter for screening mammogram for malignant neoplasm of breast: Secondary | ICD-10-CM

## 2013-06-21 ENCOUNTER — Ambulatory Visit: Payer: Self-pay

## 2013-06-24 ENCOUNTER — Encounter: Payer: Self-pay | Admitting: Internal Medicine

## 2013-06-24 ENCOUNTER — Encounter (HOSPITAL_COMMUNITY): Payer: Self-pay | Admitting: *Deleted

## 2013-06-24 ENCOUNTER — Ambulatory Visit: Payer: Self-pay | Attending: Family Medicine | Admitting: Internal Medicine

## 2013-06-24 ENCOUNTER — Emergency Department (HOSPITAL_COMMUNITY)
Admission: EM | Admit: 2013-06-24 | Discharge: 2013-06-24 | Disposition: A | Discharge status: Home / Self Care | Payer: Self-pay | Attending: Emergency Medicine | Admitting: Emergency Medicine

## 2013-06-24 DIAGNOSIS — Z8673 Personal history of transient ischemic attack (TIA), and cerebral infarction without residual deficits: Secondary | ICD-10-CM | POA: Insufficient documentation

## 2013-06-24 DIAGNOSIS — Z856 Personal history of leukemia: Secondary | ICD-10-CM | POA: Insufficient documentation

## 2013-06-24 DIAGNOSIS — Z86718 Personal history of other venous thrombosis and embolism: Secondary | ICD-10-CM | POA: Insufficient documentation

## 2013-06-24 DIAGNOSIS — Z7901 Long term (current) use of anticoagulants: Secondary | ICD-10-CM | POA: Insufficient documentation

## 2013-06-24 DIAGNOSIS — T45515A Adverse effect of anticoagulants, initial encounter: Secondary | ICD-10-CM | POA: Insufficient documentation

## 2013-06-24 DIAGNOSIS — T887XXA Unspecified adverse effect of drug or medicament, initial encounter: Secondary | ICD-10-CM | POA: Insufficient documentation

## 2013-06-24 DIAGNOSIS — Z87891 Personal history of nicotine dependence: Secondary | ICD-10-CM | POA: Insufficient documentation

## 2013-06-24 DIAGNOSIS — J309 Allergic rhinitis, unspecified: Secondary | ICD-10-CM | POA: Insufficient documentation

## 2013-06-24 DIAGNOSIS — Z8669 Personal history of other diseases of the nervous system and sense organs: Secondary | ICD-10-CM | POA: Insufficient documentation

## 2013-06-24 LAB — PROTIME-INR: Prothrombin Time: 56.6 seconds — ABNORMAL HIGH (ref 11.6–15.2)

## 2013-06-24 MED ORDER — HYDROCODONE-ACETAMINOPHEN 5-325 MG PO TABS: 1.0000 | ORAL_TABLET | ORAL | Status: DC | PRN

## 2013-06-24 NOTE — ED Notes (Signed)
Pt denies blood in stool or pain to any part of her body except her lower back, which she states she has had for some time.

## 2013-06-24 NOTE — Progress Notes (Unsigned)
PT IS HER TO ESTABLISH CARE FOR R LEG BLOOD CLOT S/P 06/06/13 @ [facility] ER. PT SENT HOME ON LOVENOX 40MG /0.4 ML AND COUMADIN THERAPY 5MG . STOPPED INJECTION LAST WEEK INR >8.0 DOCTOR NOTIFIED PT DENIES BLEEDING BUT C/O INCREASED PAIN STARTING IN R TOP FOOT RADIATING TO BACK R THIGH. SWELLING SEEN

## 2013-06-24 NOTE — Progress Notes (Unsigned)
PT BEING SENT TO [facility] VIA SHUTTLE FOR HIGH INR LEVEL. SPOKE WITH NURSE FIRST-JACKIE

## 2013-06-24 NOTE — Progress Notes (Unsigned)
Patient was here for nurse visit only to recheck her blood pressure

## 2013-06-24 NOTE — ED Notes (Signed)
Pt sent here by pcp for inr of 8.  Taking 90 mg lovenox and 5 mg coumadin daily for dvt to R leg.

## 2013-06-24 NOTE — ED Provider Notes (Signed)
CSN: [id number]     Arrival date & time 06/24/13  1800 History     First MD Initiated Contact with Patient 06/24/13 1818     Chief Complaint  Patient presents with  . Coagulation Disorder   The history is provided by the patient.   patient is brought to the emergency department after she was told that her INR was 8 and she was sent here from clinic.  She reports no headache or head trauma.  No chest pain shortness of breath.  No abdominal pain.  No melena or hematochezia.  No hematemesis.  She was recently started on Lovenox and Coumadin for a right lower extremity DVT.  She reports no chest pain or shortness of breath.  She states her last dose of Lovenox was approximately 10 days ago.  She's been taking 5 mg daily Coumadin.  She states her diet has been consistent.  Today was the first time her INR was checked since initiating Coumadin.  She is followed [facility] the [facility]  Her Coumadin was checked with a fingerstick and when her INR was found at the age was told to come to the ER.   Past Medical History  Diagnosis Date  . DVT (deep venous thrombosis)   . Leukemia   . Pulmonary embolism   . Seasonal allergies   . Chronic headaches    Past Surgical History  Procedure Laterality Date  . Cesarean section    . Tubal ligation    . Right arm fracture     Family History  Problem Relation Age of Onset  . Emphysema Father   . Heart disease Sister   . Cancer Sister   . Breast cancer Mother    History  Substance Use Topics  . Smoking status: Former Smoker -- 0.40 packs/day for 40 years    Types: Cigarettes    Quit date: 06/25/2011  . Smokeless tobacco: Not on file  . Alcohol Use: No   OB History   Grav Para Term Preterm Abortions TAB SAB Ect Mult Living                 Review of Systems  All other systems reviewed and are negative.    Allergies  Review of patient's allergies indicates no known allergies.  Home Medications   Current  Outpatient Rx  Name  Route  Sig  Dispense  Refill  . acetaminophen (TYLENOL) 500 MG tablet   Oral   Take 1,000 mg by mouth every 6 (six) hours as needed for pain.         Marland Kitchen warfarin (COUMADIN) 5 MG tablet   Oral   Take 1 tablet (5 mg total) by mouth daily.   30 tablet   0    BP 151/109  Pulse 90  Temp(Src) 98.8 F (37.1 C) (Oral)  Resp 18  Ht 5\' 2"  (1.575 m)  Wt 188 lb (85.276 kg)  BMI 34.38 kg/m2  SpO2 99% Physical Exam  Nursing note and vitals reviewed. Constitutional: She is oriented to person, place, and time. She appears well-developed and well-nourished.  HENT:  Head: Normocephalic.  Eyes: EOM are normal.  Neck: Normal range of motion.  Pulmonary/Chest: Effort normal.  Abdominal: She exhibits no distension.  Musculoskeletal: Normal range of motion.  Neurological: She is alert and oriented to person, place, and time.  Psychiatric: She has a normal mood and affect.    ED Course   Procedures (including critical care time)  Labs Reviewed  PROTIME-INR - Abnormal; Notable for the following:    Prothrombin Time 56.6 (*)    INR 6.87 (*)    All other components within normal limits   No results found. 1. Coumadin toxicity, initial encounter     MDM  Patient's INR today 6.8.  She has no signs of bleeding.  I've asked that she hold her Coumadin for the next 2 doses.  She will have her INR rechecked on Monday before resuming her Coumadin.  I've asked that she follow up closely with her team is managing the Coumadin.  Bleeding warnings given   Lyanne Co, MD 06/24/13 2045

## 2013-06-24 NOTE — Progress Notes (Unsigned)
Patient ID: Tammy [name], female   DOB: 02-18-1956, 57 y.o.   MRN: [id number]  CC:  HPI: 57 year old female recently seen in the ED for a DVT, patient was found to have right posterior tibial and peroneal DVT. The patient states that she had pulmonary embolism in 2007 and was on Coumadin for one year. She has a history of leukemia in her 52s and it is currently in remission. She has seen Dr. Sherilyn Cooter in the past. She does complain of left-sided chest pain She also complains of pain in her right leg and pain in the back of her thigh    No Known Allergies Past Medical History  Diagnosis Date  . DVT (deep venous thrombosis)   . Leukemia   . Pulmonary embolism   . Seasonal allergies   . Chronic headaches    Current Outpatient Prescriptions on File Prior to Visit  Medication Sig Dispense Refill  . acetaminophen (TYLENOL) 500 MG tablet Take 1,000 mg by mouth every 6 (six) hours as needed for pain.      Marland Kitchen warfarin (COUMADIN) 5 MG tablet Take 1 tablet (5 mg total) by mouth daily.  30 tablet  0  . enoxaparin (LOVENOX) 40 MG/0.4ML injection Inject 0.9 mLs (90 mg total) into the skin daily.  10 Syringe  0  . enoxaparin (LOVENOX) 40 MG/0.4ML injection Inject 0.4 mLs (40 mg total) into the skin daily.  4 Syringe  0  . enoxaparin (LOVENOX) 40 MG/0.4ML injection Inject 0.9 mLs (90 mg total) into the skin every 12 (twelve) hours.  4 Syringe  0  . enoxaparin (LOVENOX) 40 MG/0.4ML injection Inject 0.9 mLs (90 mg total) into the skin daily.  6 Syringe  0  . HYDROcodone-acetaminophen (NORCO/VICODIN) 5-325 MG per tablet Take 2 tablets by mouth every 4 (four) hours as needed for pain.  10 tablet  0  . warfarin (COUMADIN) 5 MG tablet Take 1 tablet (5 mg total) by mouth daily.  30 tablet  0   No current facility-administered medications on file prior to visit.   Family History  Problem Relation Age of Onset  . Emphysema Father   . Heart disease Sister   . Cancer Sister   . Breast cancer Mother    History    Social History  . Marital Status: Single    Spouse Name: N/A    Number of Children: 4  . Years of Education: N/A   Occupational History  . unemployed    Social History Main Topics  . Smoking status: Current Every Day Smoker -- 0.40 packs/day for 40 years    Types: Cigarettes  . Smokeless tobacco: Not on file  . Alcohol Use: No  . Drug Use: No  . Sexual Activity: Not on file   Other Topics Concern  . Not on file   Social History Narrative   Lives alone   Recent travel to [place] 01/2011    Review of Systems  Constitutional: Negative for fever, chills, diaphoresis, activity change, appetite change and fatigue.  HENT: Negative for ear pain, nosebleeds, congestion, facial swelling, rhinorrhea, neck pain, neck stiffness and ear discharge.   Eyes: Negative for pain, discharge, redness, itching and visual disturbance.  Respiratory: Negative for cough, choking, chest tightness, shortness of breath, wheezing and stridor.   Cardiovascular: Negative for chest pain, palpitations and leg swelling.  Gastrointestinal: Negative for abdominal distention.  Genitourinary: Negative for dysuria, urgency, frequency, hematuria, flank pain, decreased urine volume, difficulty urinating and dyspareunia.  Musculoskeletal: Negative for  back pain, joint swelling, arthralgias and gait problem.  Neurological: Negative for dizziness, tremors, seizures, syncope, facial asymmetry, speech difficulty, weakness, light-headedness, numbness and headaches.  Hematological: Negative for adenopathy. Does not bruise/bleed easily.  Psychiatric/Behavioral: Negative for hallucinations, behavioral problems, confusion, dysphoric mood, decreased concentration and agitation.    Objective:   Filed Vitals:   06/24/13 1626  BP: 115/80  Pulse: 97  Temp: 98.6 F (37 C)  Resp: 16    Physical Exam  Constitutional: Appears well-developed and well-nourished. No distress.  HENT: Normocephalic. External right and left  ear normal. Oropharynx is clear and moist.  Eyes: Conjunctivae and EOM are normal. PERRLA, no scleral icterus.  Neck: Normal ROM. Neck supple. No JVD. No tracheal deviation. No thyromegaly.  CVS: RRR, S1/S2 +, no murmurs, no gallops, no carotid bruit.  Pulmonary: Effort and breath sounds normal, no stridor, rhonchi, wheezes, rales.  Abdominal: Soft. BS +,  no distension, tenderness, rebound or guarding.  Musculoskeletal: Normal range of motion. No edema and no tenderness.  Lymphadenopathy: No lymphadenopathy noted, cervical, inguinal. Neuro: Alert. Normal reflexes, muscle tone coordination. No cranial nerve deficit. Skin: Skin is warm and dry. No rash noted. Not diaphoretic. No erythema. No pallor.  Psychiatric: Normal mood and affect. Behavior, judgment, thought content normal.   Lab Results  Component Value Date   WBC 7.0 09/02/2012   HGB 12.3 09/02/2012   HCT 36.7 09/02/2012   MCV 92.0 09/02/2012   PLT 298 09/02/2012   Lab Results  Component Value Date   CREATININE 1.00 09/02/2012   BUN 11 09/02/2012   NA 138 09/02/2012   K 3.7 09/02/2012   CL 104 09/02/2012   CO2 28 09/02/2012    Lab Results  Component Value Date   HGBA1C  Value: 5.7 (NOTE)   The ADA recommends the following therapeutic goals for glycemic   control related to Hgb A1C measurement:   Goal of Therapy:   < 7.0% Hgb A1C   Action Suggested:  > 8.0% Hgb A1C   Ref:  Diabetes Care, 22, Suppl. 1, 1999 07/04/2007   Lipid Panel     Component Value Date/Time   CHOL 175 02/11/2010 2046   TRIG 130 02/11/2010 2046   HDL 41 02/11/2010 2046   CHOLHDL 4.3 Ratio 02/11/2010 2046   VLDL 26 02/11/2010 2046   LDLCALC 108* 02/11/2010 2046       Assessment and plan:   Patient Active Problem List   Diagnosis Date Noted  . Sleep apnea 05/21/2011  . Dyspnea 03/11/2011   Recent DVT Pain in the right leg likely secondary to her acute DVT Patient was discharged from the ED with Lovenox for 5 days The patient did complete 5 days  of overlap with Lovenox and Coumadin however INR is supratherapeutic and unmeasurable Patient did notice some rectal bleeding around 8/3 Because the patient's INR is unmeasurable the patient is being sent to the ER for observation overnight recheck her INR, she may need some vitamin K tonight INR in the morning She needs to follow up with the clinic on Monday of discharge the weekend      The patient was given clear instructions to go to ER or return to medical center if symptoms don't improve, worsen or new problems develop. The patient verbalized understanding. The patient was told to call to get any lab results if not heard anything in the next week.

## 2013-06-27 ENCOUNTER — Ambulatory Visit: Payer: Self-pay | Attending: Family Medicine

## 2013-06-27 DIAGNOSIS — I749 Embolism and thrombosis of unspecified artery: Secondary | ICD-10-CM

## 2013-06-27 DIAGNOSIS — I8290 Acute embolism and thrombosis of unspecified vein: Secondary | ICD-10-CM

## 2013-06-29 ENCOUNTER — Ambulatory Visit: Payer: Self-pay | Attending: Internal Medicine

## 2013-06-29 DIAGNOSIS — I749 Embolism and thrombosis of unspecified artery: Secondary | ICD-10-CM

## 2013-06-29 DIAGNOSIS — I8290 Acute embolism and thrombosis of unspecified vein: Secondary | ICD-10-CM

## 2013-06-29 LAB — POCT INR: INR: 2.6

## 2013-06-30 ENCOUNTER — Ambulatory Visit: Payer: Self-pay | Attending: Internal Medicine

## 2013-07-06 ENCOUNTER — Other Ambulatory Visit: Payer: Self-pay | Admitting: Internal Medicine

## 2013-07-06 ENCOUNTER — Telehealth: Payer: Self-pay | Admitting: Emergency Medicine

## 2013-07-06 ENCOUNTER — Ambulatory Visit: Payer: Self-pay | Attending: Internal Medicine

## 2013-07-06 VITALS — BP 150/91 | HR 99 | Temp 99.2°F | Resp 16

## 2013-07-06 DIAGNOSIS — R42 Dizziness and giddiness: Secondary | ICD-10-CM | POA: Insufficient documentation

## 2013-07-06 DIAGNOSIS — I2699 Other pulmonary embolism without acute cor pulmonale: Secondary | ICD-10-CM | POA: Insufficient documentation

## 2013-07-06 LAB — POCT INR: INR: 6.4

## 2013-07-06 MED ORDER — PHYTONADIONE 5 MG PO TABS: 5.0000 mg | ORAL_TABLET | Freq: Once | ORAL | Status: DC

## 2013-07-06 NOTE — Telephone Encounter (Signed)
LEFT A MESSAGE WITH PT DAUGHTER ALISHIA TO PICK RX VITAMIN K X ONCE PER DR. Hyman Hopes

## 2013-07-06 NOTE — Progress Notes (Signed)
Pt here for INR level. States she feels dizzy at times.

## 2013-07-06 NOTE — Patient Instructions (Signed)
Pt to hold taking coumadin x 2 dys and return for recheck. Pt informed if any CP,bleeding,abdominal pain go to nearest ER.

## 2013-07-08 ENCOUNTER — Ambulatory Visit: Payer: No Typology Code available for payment source | Attending: Family Medicine

## 2013-07-08 ENCOUNTER — Encounter: Payer: Self-pay | Admitting: Internal Medicine

## 2013-07-08 ENCOUNTER — Ambulatory Visit (HOSPITAL_BASED_OUTPATIENT_CLINIC_OR_DEPARTMENT_OTHER): Payer: No Typology Code available for payment source | Admitting: Internal Medicine

## 2013-07-08 VITALS — BP 119/85 | HR 85 | Temp 98.3°F | Resp 16 | Ht 64.0 in | Wt 188.0 lb

## 2013-07-08 DIAGNOSIS — R0609 Other forms of dyspnea: Secondary | ICD-10-CM

## 2013-07-08 DIAGNOSIS — M25551 Pain in right hip: Secondary | ICD-10-CM

## 2013-07-08 DIAGNOSIS — M25559 Pain in unspecified hip: Secondary | ICD-10-CM

## 2013-07-08 DIAGNOSIS — I82409 Acute embolism and thrombosis of unspecified deep veins of unspecified lower extremity: Secondary | ICD-10-CM | POA: Insufficient documentation

## 2013-07-08 DIAGNOSIS — I82401 Acute embolism and thrombosis of unspecified deep veins of right lower extremity: Secondary | ICD-10-CM

## 2013-07-08 DIAGNOSIS — R0600 Dyspnea, unspecified: Secondary | ICD-10-CM

## 2013-07-08 NOTE — Progress Notes (Unsigned)
Pt is here today for lab work. She states that she is having pain in her LUQ that comes and goes, throbbing, shooting and sharp. Pain is a 7. Also she's had pain in her right hip for 3 months, dull and aches.

## 2013-07-08 NOTE — Progress Notes (Signed)
Patient ID: Tammy [name], female   DOB: 1956-08-28, 57 y.o.   MRN: [id number] Patient Demographics  Tammy [name], is a 57 y.o. female  [id number]  [id number]  DOB - [birth year]  Chief Complaint  Patient presents with  . Follow-up        Subjective:   Tammy [name] today is here for a follow up visit. Patient originally presented for INR check which was 6.4 on 07/06/13 but then reported that she wanted to see doctor. Patient reports that she has been having sharp stabbing intermittent pains on the left side, pleuritic. She was diagnosed with acute DVT in the right LE on 06/06/13, started on warfarin. Patient also reports that she has a prior history of PE , previously saw Dr. Delton Coombes from [facility] pulmonology.   Patient has No headache,  No abdominal pain - No Nausea, No new weakness tingling or numbness, No Cough - SOB.   Objective:    Filed Vitals:   07/08/13 1054  BP: 119/85  Pulse: 85  Temp: 98.3 F (36.8 C)  TempSrc: Oral  Resp: 16  Height: 5\' 4"  (1.626 m)  Weight: 188 lb (85.276 kg)  SpO2: 96%     ALLERGIES:  No Known Allergies  PAST MEDICAL HISTORY: Past Medical History  Diagnosis Date  . DVT (deep venous thrombosis)   . Leukemia   . Pulmonary embolism   . Seasonal allergies   . Chronic headaches     MEDICATIONS AT HOME: Prior to Admission medications   Medication Sig Start Date End Date Taking? Authorizing Provider  acetaminophen (TYLENOL) 500 MG tablet Take 1,000 mg by mouth every 6 (six) hours as needed for pain.    Historical Provider, MD  HYDROcodone-acetaminophen (NORCO/VICODIN) 5-325 MG per tablet Take 1 tablet by mouth every 4 (four) hours as needed for pain. 06/24/13   Lyanne Co, MD  phytonadione (VITAMIN K) 5 MG tablet Take 1 tablet (5 mg total) by mouth once. 07/06/13   [name], MD  warfarin (COUMADIN) 5 MG tablet Take 1 tablet (5 mg total) by mouth daily. 06/06/13   Claudean Kinds, MD     Exam  General appearance :Awake, alert,  NAD, Speech Clear.  HEENT: Atraumatic and Normocephalic, PERLA Neck: supple, no JVD. No cervical lymphadenopathy.  Chest: Clear to auscultation bilaterally, no wheezing, rales or rhonchi CVS: S1 S2 regular, no murmurs.  Abdomen:obese, soft, NBS, NT, ND, no gaurding, rigidity or rebound. Extremities: no cyanosis or clubbing, B/L Lower Ext shows no edema Neurology: Awake alert, and oriented X 3, CN II-XII intact, Non focal Skin: No Rash or lesions Wounds:N/A    Data Review   Basic Metabolic Panel: No results found for this basename: NA, K, CL, CO2, GLUCOSE, BUN, CREATININE, CALCIUM, MG, PHOS,  in the last 168 hours Liver Function Tests: No results found for this basename: AST, ALT, ALKPHOS, BILITOT, PROT, ALBUMIN,  in the last 168 hours  CBC: No results found for this basename: WBC, NEUTROABS, HGB, HCT, MCV, PLT,  in the last 168 hours  ------------------------------------------------------------------------------------------------------------------ No results found for this basename: HGBA1C,  in the last 72 hours ------------------------------------------------------------------------------------------------------------------ No results found for this basename: CHOL, HDL, LDLCALC, TRIG, CHOLHDL, LDLDIRECT,  in the last 72 hours ------------------------------------------------------------------------------------------------------------------ No results found for this basename: TSH, T4TOTAL, FREET3, T3FREE, THYROIDAB,  in the last 72 hours ------------------------------------------------------------------------------------------------------------------ No results found for this basename: VITAMINB12, FOLATE, FERRITIN, TIBC, IRON, RETICCTPCT,  in the last 72 hours  Coagulation profile   Recent Labs Lab  07/06/13 0931  INR 6.4      Assessment & Plan   Active Problems: Acute right lower extremity DVT: - Concern about pulmonary embolism due to the complaints of chest pain,  intermittent, pleuritic. No acute chest pain at this time. - Ordered CT angiogram of the chest to rule out PE. She will likely need to be lifelong anticoagulation, given second DVT currently - INR 5.7 today, patient will need to continue to hold Coumadin. Recheck PT/INR on Monday 07/11/13.   Right hip pain: Ordered hip x-ray  Recommendations: PT/INR check on Monday, follow up on CT angio of the chest     Dhruva Orndoff M.D. 07/08/2013, 11:14 AM

## 2013-07-08 NOTE — Progress Notes (Signed)
Pt is here for office visit Also here today for labs Pt C.C. Having pain in her LUQ, throbbing, shooting, comes and goes level 7 She also is having hip pain, dull, aches started 3 months ago.

## 2013-07-11 ENCOUNTER — Ambulatory Visit: Payer: No Typology Code available for payment source | Attending: Internal Medicine

## 2013-07-11 ENCOUNTER — Ambulatory Visit (HOSPITAL_COMMUNITY)
Admission: RE | Admit: 2013-07-11 | Discharge: 2013-07-11 | Disposition: A | Discharge status: Home / Self Care | Payer: No Typology Code available for payment source | Source: Ambulatory Visit | Attending: Internal Medicine | Admitting: Internal Medicine

## 2013-07-11 ENCOUNTER — Telehealth: Payer: Self-pay

## 2013-07-11 DIAGNOSIS — I2699 Other pulmonary embolism without acute cor pulmonale: Secondary | ICD-10-CM | POA: Insufficient documentation

## 2013-07-11 DIAGNOSIS — M25559 Pain in unspecified hip: Secondary | ICD-10-CM | POA: Insufficient documentation

## 2013-07-11 DIAGNOSIS — R0600 Dyspnea, unspecified: Secondary | ICD-10-CM

## 2013-07-11 DIAGNOSIS — R0602 Shortness of breath: Secondary | ICD-10-CM | POA: Insufficient documentation

## 2013-07-11 DIAGNOSIS — R079 Chest pain, unspecified: Secondary | ICD-10-CM | POA: Insufficient documentation

## 2013-07-11 DIAGNOSIS — I749 Embolism and thrombosis of unspecified artery: Secondary | ICD-10-CM

## 2013-07-11 DIAGNOSIS — I82401 Acute embolism and thrombosis of unspecified deep veins of right lower extremity: Secondary | ICD-10-CM

## 2013-07-11 DIAGNOSIS — Z86718 Personal history of other venous thrombosis and embolism: Secondary | ICD-10-CM | POA: Insufficient documentation

## 2013-07-11 DIAGNOSIS — I8290 Acute embolism and thrombosis of unspecified vein: Secondary | ICD-10-CM

## 2013-07-11 LAB — POCT INR: INR: 2.2

## 2013-07-11 MED ORDER — IOHEXOL 350 MG/ML SOLN
100.0000 mL | Freq: Once | INTRAVENOUS | Status: AC | PRN
  Administered 2013-07-11: 100 mL via INTRAVENOUS

## 2013-07-11 NOTE — Telephone Encounter (Signed)
Refill for coumadin called in to [facility]

## 2013-07-19 ENCOUNTER — Encounter (HOSPITAL_COMMUNITY): Payer: Self-pay

## 2013-07-19 ENCOUNTER — Ambulatory Visit (HOSPITAL_COMMUNITY)
Admission: RE | Admit: 2013-07-19 | Discharge: 2013-07-19 | Disposition: A | Discharge status: Home / Self Care | Payer: No Typology Code available for payment source | Source: Ambulatory Visit | Attending: Obstetrics and Gynecology | Admitting: Obstetrics and Gynecology

## 2013-07-19 ENCOUNTER — Ambulatory Visit (HOSPITAL_COMMUNITY)
Admission: RE | Admit: 2013-07-19 | Discharge: 2013-07-19 | Disposition: A | Discharge status: Home / Self Care | Payer: Self-pay | Source: Ambulatory Visit | Attending: Obstetrics and Gynecology | Admitting: Obstetrics and Gynecology

## 2013-07-19 VITALS — BP 124/72 | Temp 98.4°F | Ht 62.0 in | Wt 190.0 lb

## 2013-07-19 DIAGNOSIS — Z1231 Encounter for screening mammogram for malignant neoplasm of breast: Secondary | ICD-10-CM | POA: Insufficient documentation

## 2013-07-19 DIAGNOSIS — Z01419 Encounter for gynecological examination (general) (routine) without abnormal findings: Secondary | ICD-10-CM

## 2013-07-19 NOTE — Patient Instructions (Signed)
Taught Tammy [name] how to perform BSE and gave educational materials to take home. Let her know [facility] will cover Pap smears every 3 years unless has a history of abnormal Pap smears. Let patient know will follow up with her within the next couple weeks with results by letter or phone. Tammy [name] verbalized understanding. Patient escorted to mammography for a screening mammogram.  Anja Neuzil, Kathaleen Maser, RN 10:46 AM

## 2013-07-19 NOTE — Progress Notes (Signed)
No complaints today.  Pap Smear:    Pap smear completed today. Patients last Pap smear was 09/14/2009 and normal. Per patient has no history of an abnormal Pap smear. Pap smear result above is in EPIC.  Physical exam: Breasts Breasts symmetrical. No skin abnormalities bilateral breasts. No nipple retraction bilateral breasts. No nipple discharge bilateral breasts. No lymphadenopathy. No lumps palpated bilateral breasts. No complaints of pain or tenderness on exam. Patient escorted to mammography for a screening mammogram.          Pelvic/Bimanual   Ext Genitalia No lesions, no swelling and no discharge observed on external genitalia.         Vagina Vagina pink and normal texture. No lesions or discharge observed in vagina.          Cervix Cervix is present. Cervix pink and of normal texture. No discharge observed.     Uterus Uterus is present and palpable. Uterus in normal position and normal size.        Adnexae Bilateral ovaries present and palpable. No tenderness on palpation.          Rectovaginal No rectal exam completed today since patient had no rectal complaints. No skin abnormalities observed on exam.

## 2013-07-25 ENCOUNTER — Ambulatory Visit: Payer: No Typology Code available for payment source | Attending: Family Medicine | Admitting: Internal Medicine

## 2013-07-25 DIAGNOSIS — J301 Allergic rhinitis due to pollen: Secondary | ICD-10-CM | POA: Insufficient documentation

## 2013-07-25 DIAGNOSIS — Z79899 Other long term (current) drug therapy: Secondary | ICD-10-CM | POA: Insufficient documentation

## 2013-07-25 DIAGNOSIS — I749 Embolism and thrombosis of unspecified artery: Secondary | ICD-10-CM

## 2013-07-25 DIAGNOSIS — R51 Headache: Secondary | ICD-10-CM | POA: Insufficient documentation

## 2013-07-25 DIAGNOSIS — Z86711 Personal history of pulmonary embolism: Secondary | ICD-10-CM | POA: Insufficient documentation

## 2013-07-25 DIAGNOSIS — M25559 Pain in unspecified hip: Secondary | ICD-10-CM | POA: Insufficient documentation

## 2013-07-25 DIAGNOSIS — R0989 Other specified symptoms and signs involving the circulatory and respiratory systems: Secondary | ICD-10-CM | POA: Insufficient documentation

## 2013-07-25 DIAGNOSIS — R0609 Other forms of dyspnea: Secondary | ICD-10-CM | POA: Insufficient documentation

## 2013-07-25 DIAGNOSIS — G4730 Sleep apnea, unspecified: Secondary | ICD-10-CM | POA: Insufficient documentation

## 2013-07-25 DIAGNOSIS — C9590 Leukemia, unspecified not having achieved remission: Secondary | ICD-10-CM | POA: Insufficient documentation

## 2013-07-25 DIAGNOSIS — I8290 Acute embolism and thrombosis of unspecified vein: Secondary | ICD-10-CM

## 2013-07-25 DIAGNOSIS — D689 Coagulation defect, unspecified: Secondary | ICD-10-CM | POA: Insufficient documentation

## 2013-07-25 DIAGNOSIS — Z86718 Personal history of other venous thrombosis and embolism: Secondary | ICD-10-CM | POA: Insufficient documentation

## 2013-07-25 DIAGNOSIS — Z7901 Long term (current) use of anticoagulants: Secondary | ICD-10-CM | POA: Insufficient documentation

## 2013-07-25 LAB — LIPID PANEL
Cholesterol: 281 mg/dL — ABNORMAL HIGH (ref 0–200)
HDL: 46 mg/dL (ref >39)
Total CHOL/HDL Ratio: 6.1 Ratio
Triglycerides: 438 mg/dL — ABNORMAL HIGH (ref <150)

## 2013-07-25 LAB — POCT INR: INR: >8

## 2013-07-25 MED ORDER — PHYTONADIONE 5 MG PO TABS
5.0000 mg | ORAL_TABLET | Freq: Once | ORAL | Status: AC
  Administered 2013-07-25: 5 mg via ORAL

## 2013-07-25 MED ORDER — VITAMIN K 100 MCG PO TABS: 100.0000 ug | ORAL_TABLET | Freq: Every day | ORAL | Status: DC

## 2013-07-25 NOTE — Progress Notes (Signed)
Patient here for INR check >8.0 Complains of knot to right wrist and right eye lid

## 2013-07-25 NOTE — Patient Instructions (Signed)
Pt instructed to hold coumadin for 2 dys and return Wednesday for repeat.  Vitamin K 5 mg given

## 2013-07-25 NOTE — Progress Notes (Signed)
Patient ID: Tammy [name], female   DOB: 1956/06/25, 57 y.o.   MRN: [id number] Patient Demographics  Tammy [name], is a 57 y.o. female  [id number]  [id number]  DOB - [birth year]  Chief Complaint  Patient presents with  . Coagulation Disorder        Subjective:   Tammy [name] is a 57 y.o. female here today for a follow up visit. INR today is 8.0. She is on 5 mg tablet Coumadin daily, last INR 2 weeks ago 2.2 on the same dose. Patient claims no bleeding.  Patient has No headache, No chest pain, No abdominal pain - No Nausea, No new weakness tingling or numbness, No Cough - SOB.  ALLERGIES:  No Known Allergies  PAST MEDICAL HISTORY: Past Medical History  Diagnosis Date  . DVT (deep venous thrombosis)   . Pulmonary embolism   . Seasonal allergies   . Chronic headaches   . Leukemia     MEDICATIONS AT HOME: Prior to Admission medications   Medication Sig Start Date End Date Taking? Authorizing Provider  acetaminophen (TYLENOL) 500 MG tablet Take 1,000 mg by mouth every 6 (six) hours as needed for pain.    Historical Provider, MD  HYDROcodone-acetaminophen (NORCO/VICODIN) 5-325 MG per tablet Take 1 tablet by mouth every 4 (four) hours as needed for pain. 06/24/13   Lyanne Co, MD  phytonadione (VITAMIN K) 5 MG tablet Take 1 tablet (5 mg total) by mouth once. 07/06/13   [name], MD  vitamin k 100 MCG tablet Take 1 tablet (100 mcg total) by mouth daily. 07/25/13   [name], MD  warfarin (COUMADIN) 5 MG tablet Take 1 tablet (5 mg total) by mouth daily. 06/06/13   Roney Marion, MD     Objective:   There were no vitals filed for this visit.  Exam General appearance :Awake, alert, not in any distress. Speech Clear. Not toxic Looking HEENT: Atraumatic and Normocephalic, pupils equally reactive to light and accomodation Neck: supple, no JVD. No cervical lymphadenopathy.  Chest:Good air entry bilaterally, no added sounds  CVS: S1 S2 regular, no murmurs.   Abdomen: Bowel sounds present, Non tender and not distended with no gaurding, rigidity or rebound. Extremities: B/L Lower Ext shows no edema, both legs are warm to touch Neurology: Awake alert, and oriented X 3, CN II-XII intact, Non focal Skin:No Rash Wounds:N/A   Data Review   CBC No results found for this basename: WBC, HGB, HCT, PLT, MCV, MCH, MCHC, RDW, NEUTRABS, LYMPHSABS, MONOABS, EOSABS, BASOSABS, BANDABS, BANDSABD,  in the last 168 hours  Chemistries   No results found for this basename: NA, K, CL, CO2, GLUCOSE, BUN, CREATININE, GFRCGP, CALCIUM, MG, AST, ALT, ALKPHOS, BILITOT,  in the last 168 hours ------------------------------------------------------------------------------------------------------------------ No results found for this basename: HGBA1C,  in the last 72 hours ------------------------------------------------------------------------------------------------------------------ No results found for this basename: CHOL, HDL, LDLCALC, TRIG, CHOLHDL, LDLDIRECT,  in the last 72 hours ------------------------------------------------------------------------------------------------------------------ No results found for this basename: TSH, T4TOTAL, FREET3, T3FREE, THYROIDAB,  in the last 72 hours ------------------------------------------------------------------------------------------------------------------ No results found for this basename: VITAMINB12, FOLATE, FERRITIN, TIBC, IRON, RETICCTPCT,  in the last 72 hours  Coagulation profile   Recent Labs Lab 07/25/13 0947  INR >8.0      Assessment & Plan   Patient Active Problem List   Diagnosis Date Noted  . Acute DVT (deep venous thrombosis) 07/08/2013  . Hip pain 07/08/2013  . Sleep apnea 05/21/2011  . Dyspnea 03/11/2011     Plan:  Tablet vitamin K 5 mg by mouth now Vitamin K tablet 100 mcg by mouth tomorrow Hold Coumadin tablet for 2 days Repeat INR in 3 days Patient extensively counseled about  avoiding physical contact with hard surfaces to prevent injury and bleeding Patient referred to a Child psychotherapist for discussions on available resources as patient claims she cannot work and she needs disability Patient has been counseled on nutrition  Follow up in 3 days for INR   The patient was given clear instructions to go to ER or return to medical center if symptoms don't improve, worsen or new problems develop. The patient verbalized understanding. The patient was told to call to get lab results if they haven't heard anything in the next week.    [name], MD, MHA, FACP [facility] [place], [place] [phone]   07/25/2013, 11:31 AM

## 2013-07-27 ENCOUNTER — Other Ambulatory Visit: Payer: Self-pay | Admitting: Internal Medicine

## 2013-07-27 ENCOUNTER — Telehealth: Payer: Self-pay | Admitting: Hematology and Oncology

## 2013-07-27 ENCOUNTER — Ambulatory Visit: Payer: No Typology Code available for payment source | Attending: Internal Medicine

## 2013-07-27 DIAGNOSIS — I82409 Acute embolism and thrombosis of unspecified deep veins of unspecified lower extremity: Secondary | ICD-10-CM | POA: Insufficient documentation

## 2013-07-27 MED ORDER — VITAMIN K1 10 MG/ML IJ SOLN
1.0000 mg | Freq: Once | INTRAMUSCULAR | Status: AC
  Administered 2013-07-27: 1 mg via SUBCUTANEOUS

## 2013-07-27 MED ORDER — PHYTONADIONE 5 MG PO TABS: 5.0000 mg | ORAL_TABLET | Freq: Every day | ORAL | Status: DC

## 2013-07-27 NOTE — Progress Notes (Signed)
PT HERE FOR REPEAT INR POST COUMADIN ON HOLD X 2 DYS

## 2013-07-27 NOTE — Addendum Note (Signed)
Addended by: Nonnie Done D on: 07/27/2013 03:24 PM   Modules accepted: Orders

## 2013-07-27 NOTE — Progress Notes (Signed)
Pt received 1 mg [place] injection. Instructions to repeat 5mg  po x 1 and return Friday for recheck. Also informed to be cautious with bumps,falls

## 2013-07-27 NOTE — Patient Instructions (Addendum)
INR >8.0  PT INSTRUCTED TO COME BACK FOR VITAMIN K SQ INJECTION PT DAUGHTER WILL BRING HER BACK. INFORMED TO NOT TAKE COUMADIN

## 2013-07-27 NOTE — Telephone Encounter (Signed)
S/w pt and gve np appt 10/09 @ 9:30 w/Dr. Bertis Ruddy Dx- [facility] packet mailed.

## 2013-07-28 ENCOUNTER — Telehealth (HOSPITAL_COMMUNITY): Payer: Self-pay | Admitting: *Deleted

## 2013-07-28 NOTE — Telephone Encounter (Signed)
Telephoned patient at home # and discussed abnormal pap smear results. Advised patient was schedule for colposcopy at [facility] on Thursday Oct 2 9:00. Patient voiced understanding.

## 2013-07-29 ENCOUNTER — Ambulatory Visit: Payer: No Typology Code available for payment source | Attending: Internal Medicine

## 2013-07-29 VITALS — BP 113/76 | HR 66 | Temp 98.7°F | Resp 16

## 2013-07-29 DIAGNOSIS — Z09 Encounter for follow-up examination after completed treatment for conditions other than malignant neoplasm: Secondary | ICD-10-CM | POA: Insufficient documentation

## 2013-07-29 DIAGNOSIS — I2699 Other pulmonary embolism without acute cor pulmonale: Secondary | ICD-10-CM

## 2013-07-29 NOTE — Patient Instructions (Signed)
INR 1.8 INSTRUCTIONS TO TAKE 5 MG COUMADIN AND RETURN NEXT Monday 08/01/13 REPORT IF ANY BLEEDING

## 2013-07-29 NOTE — Progress Notes (Signed)
Pt return for repeat INR post >8.0 Instructed to hold coumadin and Vitamin K inj given/PO

## 2013-08-01 ENCOUNTER — Ambulatory Visit: Payer: No Typology Code available for payment source | Attending: Internal Medicine

## 2013-08-01 VITALS — BP 123/83 | HR 94 | Temp 98.9°F | Resp 16 | Ht 64.0 in | Wt 191.0 lb

## 2013-08-01 DIAGNOSIS — I82409 Acute embolism and thrombosis of unspecified deep veins of unspecified lower extremity: Secondary | ICD-10-CM

## 2013-08-01 DIAGNOSIS — Z0000 Encounter for general adult medical examination without abnormal findings: Secondary | ICD-10-CM

## 2013-08-01 NOTE — Progress Notes (Unsigned)
Pt is here for Labs only today.

## 2013-08-01 NOTE — Patient Instructions (Addendum)
Pt is to stay on the same dose of medication. Eat healthy stay away from greens and exercise.

## 2013-08-04 ENCOUNTER — Encounter: Payer: Self-pay | Admitting: Family Medicine

## 2013-08-04 ENCOUNTER — Ambulatory Visit (INDEPENDENT_AMBULATORY_CARE_PROVIDER_SITE_OTHER): Payer: Self-pay | Admitting: Family Medicine

## 2013-08-04 VITALS — BP 119/68 | HR 89 | Temp 98.7°F | Ht 64.0 in | Wt 189.5 lb

## 2013-08-04 DIAGNOSIS — IMO0002 Reserved for concepts with insufficient information to code with codable children: Secondary | ICD-10-CM | POA: Insufficient documentation

## 2013-08-04 DIAGNOSIS — R6889 Other general symptoms and signs: Secondary | ICD-10-CM

## 2013-08-04 NOTE — Progress Notes (Signed)
Patient ID: Tammy [name], female   DOB: May 06, 1956, 57 y.o.   MRN: [id number] Patient given informed consent, signed copy in the chart.  Placed in lithotomy position. Cervix viewed with speculum and colposcope after application of acetic acid.   Colposcopy adequate (entire squamocolumnar junctions seen  in entirety) ?  Yes with some difficulty as she has pale perimenopausal appearing cervix Acetowhite lesions?no Punctation?no Mosaicism?  no Abnormal vasculature?  no Biopsies?no ECC?no Complications? no  COMMENTS: Patient was given post procedure instructions.   Given hx no prior abnl paps,  Clinically normal colposcopy today,not sexually active last 5-10 years, recommend cotest next year with [place] and she is in agreement.

## 2013-08-11 ENCOUNTER — Encounter: Payer: Self-pay | Admitting: Hematology and Oncology

## 2013-08-11 ENCOUNTER — Ambulatory Visit (HOSPITAL_BASED_OUTPATIENT_CLINIC_OR_DEPARTMENT_OTHER): Payer: No Typology Code available for payment source | Admitting: Lab

## 2013-08-11 ENCOUNTER — Encounter: Payer: Self-pay | Admitting: Obstetrics and Gynecology

## 2013-08-11 ENCOUNTER — Ambulatory Visit (HOSPITAL_BASED_OUTPATIENT_CLINIC_OR_DEPARTMENT_OTHER): Payer: No Typology Code available for payment source | Admitting: Hematology and Oncology

## 2013-08-11 ENCOUNTER — Ambulatory Visit: Payer: No Typology Code available for payment source

## 2013-08-11 ENCOUNTER — Telehealth: Payer: Self-pay | Admitting: Hematology and Oncology

## 2013-08-11 VITALS — BP 140/70 | HR 91 | Temp 98.2°F | Resp 20 | Ht 64.0 in | Wt 191.3 lb

## 2013-08-11 DIAGNOSIS — I82409 Acute embolism and thrombosis of unspecified deep veins of unspecified lower extremity: Secondary | ICD-10-CM

## 2013-08-11 DIAGNOSIS — R51 Headache: Secondary | ICD-10-CM

## 2013-08-11 DIAGNOSIS — I82401 Acute embolism and thrombosis of unspecified deep veins of right lower extremity: Secondary | ICD-10-CM

## 2013-08-11 DIAGNOSIS — I2699 Other pulmonary embolism without acute cor pulmonale: Secondary | ICD-10-CM

## 2013-08-11 LAB — COMPREHENSIVE METABOLIC PANEL (CC13)
ALT: 13 U/L (ref 0–55)
AST: 15 U/L (ref 5–34)
Albumin: 3.4 g/dL — ABNORMAL LOW (ref 3.5–5.0)
Alkaline Phosphatase: 112 U/L (ref 40–150)
Anion Gap: 7 mEq/L (ref 3–11)
BUN: 10.5 mg/dL (ref 7.0–26.0)
CO2: 27 mEq/L (ref 22–29)
Calcium: 9.5 mg/dL (ref 8.4–10.4)
Chloride: 107 mEq/L (ref 98–109)
Creatinine: 1 mg/dL (ref 0.6–1.1)
Glucose: 102 mg/dl (ref 70–140)
Potassium: 4.3 mEq/L (ref 3.5–5.1)
Sodium: 141 mEq/L (ref 136–145)
Total Bilirubin: 0.73 mg/dL (ref 0.20–1.20)
Total Protein: 7.3 g/dL (ref 6.4–8.3)

## 2013-08-11 LAB — CBC WITH DIFFERENTIAL/PLATELET
BASO%: 1.2 % (ref 0.0–2.0)
EOS%: 1.7 % (ref 0.0–7.0)
HGB: 12.3 g/dL (ref 11.6–15.9)
MCH: 30.4 pg (ref 25.1–34.0)
MCHC: 33.3 g/dL (ref 31.5–36.0)
MCV: 91.2 fL (ref 79.5–101.0)
MONO#: 0.6 10*3/uL (ref 0.1–0.9)
MONO%: 9.5 % (ref 0.0–14.0)
RDW: 13.6 % (ref 11.2–14.5)
WBC: 6.2 10*3/uL (ref 3.9–10.3)
lymph#: 1.7 10*3/uL (ref 0.9–3.3)

## 2013-08-11 MED ORDER — ENOXAPARIN SODIUM 80 MG/0.8ML ~~LOC~~ SOLN: 80.0000 mg | Freq: Two times a day (BID) | SUBCUTANEOUS | Status: DC

## 2013-08-11 NOTE — Progress Notes (Signed)
Checked in new patient with [facility] card, she tried to for insurance and was told could not get because she was not working. The last time she tried for medicaid was told needed kids under 18 in household. I advised her she needs to try with them again.

## 2013-08-11 NOTE — Telephone Encounter (Signed)
gv and printed appt sched and avs for OCT...sent back to lab

## 2013-08-11 NOTE — Progress Notes (Signed)
Patient came in and I gave her [facility] grant form. She will have her daughter faxed letter of support.ok to give meds for today. She will have her daughter call me when she faxed.

## 2013-08-11 NOTE — Progress Notes (Signed)
[facility] CONSULT NOTE CHIEF COMPLAINTS/PURPOSE OF CONSULTATION: Recurrent DVT, for further management and fill anticoagulation therapy with warfarin  HISTORY OF PRESENTING ILLNESS:  Tammy [name] 57 y.o. female is being referred or because of recurrent DVT. According to the patient, she was first diagnosed with blood clot in 2008. According to the patient it was unprovoked. The patient complained of shortness of breath and cough and chest discomfort. She was smoking at that time. She had imaging study of the lung done which show evidence of PE. According to the patient she was on a blood thinner for 2 years. The data of the CT scan was 06/24/2007. Recently, she started to complain of intermittent sharp discomfort on the right leg associated with some mild swelling. She states that she had pain on the left leg as well but is not as severe compared to the right leg. She went to the emergency department. Results of the ultrasound venous Doppler came back consistent with acute deep vein thrombosis involving the posterior tibial and perioneal vein of the right lower extremity. D-dimer was elevated. She was placed on Lovenox and transition to warfarin 5 mg daily. Throughout the last 2 months, her INR has been supratherapeutic requiring vitamin K and multiple dose interruption to keep her INR at the desire range at 2-3. She denies any bleeding complications such as spontaneous epistaxis, hematuria, hematochezia, or the need for blood transfusions. She had mild intermittent chest discomfort throughout this and had imaging study done which show possible new clot in the left lung on the CT scan from September 2014. The screening program so up today. Her mammogram was negative. Her most recent Pap smear was abnormal but she's currently being observed. She never had a colonoscopy done. She has been pregnant 4 times including C-section for her pregnancy and never developed any peripartum blood clots.  There were no family history of blood clots.   MEDICAL HISTORY:  Past Medical History  Diagnosis Date  . DVT (deep venous thrombosis)   . Pulmonary embolism   . Seasonal allergies   . Chronic headaches   . Leukemia     Told she had it at age 62, given some shots    SURGICAL HISTORY: Past Surgical History  Procedure Laterality Date  . Tubal ligation    . Right arm fracture    . Cesarean section      one previous    SOCIAL HISTORY: History   Social History  . Marital Status: Single    Spouse Name: N/A    Number of Children: 4  . Years of Education: N/A   Occupational History  . unemployed    Social History Main Topics  . Smoking status: Former Smoker -- 0.40 packs/day for 40 years    Types: Cigarettes    Quit date: 06/25/2011  . Smokeless tobacco: Not on file  . Alcohol Use: No  . Drug Use: No  . Sexual Activity: Not Currently   Other Topics Concern  . Not on file   Social History Narrative   Lives alone   Recent travel to [place] 01/2011    FAMILY HISTORY: Family History  Problem Relation Age of Onset  . Emphysema Father   . Heart disease Sister   . Cancer Sister   . Breast cancer Mother   . Breast cancer Maternal Grandmother     ALLERGIES:  has No Known Allergies.  MEDICATIONS: Current outpatient prescriptions:acetaminophen (TYLENOL) 500 MG tablet, Take 1,000 mg by mouth every  6 (six) hours as needed for pain., Disp: , Rfl: ;  HYDROcodone-acetaminophen (NORCO/VICODIN) 5-325 MG per tablet, Take 1 tablet by mouth every 4 (four) hours as needed for pain., Disp: 15 tablet, Rfl: 0;  phytonadione (VITAMIN K) 5 MG tablet, Take 1 tablet (5 mg total) by mouth daily., Disp: 2 tablet, Rfl: 0 warfarin (COUMADIN) 5 MG tablet, Take 1 tablet (5 mg total) by mouth daily., Disp: 30 tablet, Rfl: 0  REVIEW OF SYSTEMS:   Constitutional: Denies fevers, chills or abnormal weight loss Eyes: Denies blurriness of vision, double vision or watery eyes Ears, nose, mouth,  throat, and face: Denies mucositis or sore throat Respiratory: Denies cough, dyspnea or wheezes Cardiovascular: Denies palpitation, chest discomfort or lower extremity swelling Gastrointestinal:  Denies nausea, heartburn or change in bowel habits Skin: Denies abnormal skin rashes Lymphatics: Denies new lymphadenopathy or easy bruising Neurological:Denies numbness, tingling or new weaknesses Behavioral/Psych: Mood is stable, no new changes  All other systems were reviewed with the patient and are negative.  PHYSICAL EXAMINATION: ECOG PERFORMANCE STATUS: 1 - Symptomatic but completely ambulatory  Filed Vitals:   08/11/13 0955  BP: 140/70  Pulse: 91  Temp: 98.2 F (36.8 C)  Resp: 20   Filed Weights   08/11/13 0955  Weight: 191 lb 4.8 oz (86.773 kg)   GENERAL:alert, no distress and comfortableMorbidly obese  SKIN: skin color, texture, turgor are normal, no rashes or significant lesions EYES: normal, conjunctiva are pink and non-injected, sclera clear OROPHARYNX:no exudate, no erythema and lips, buccal mucosa, and tongue normal  NECK: supple, thyroid normal size, non-tender, without nodularity LYMPH:  no palpable lymphadenopathy in the cervical, axillary or inguinal LUNGS: clear to auscultation and percussion with normal breathing effort HEART: regular rate & rhythm and no murmurs and no lower extremity edema. Specifically her right lower extremity does not look swollen.  ABDOMEN:abdomen soft, non-tender and normal bowel sounds Musculoskeletal:no cyanosis of digits and no clubbing  PSYCH: alert & oriented x 3 with fluent speech NEURO: no focal motor/sensory deficits  LABORATORY DATA:  I have reviewed the data as listed Recent Results (from the past 2160 hour(s))  D-DIMER, QUANTITATIVE     Status: Abnormal   Collection Time    06/06/13  4:35 PM      Result Value Range   D-Dimer, Quant 1.44 (*) 0.00 - 0.48 ug/mL-FEU   Comment:            AT THE INHOUSE ESTABLISHED CUTOFF      VALUE OF 0.48 ug/mL FEU,     THIS ASSAY HAS BEEN DOCUMENTED     IN THE LITERATURE TO HAVE     A SENSITIVITY AND NEGATIVE     PREDICTIVE VALUE OF AT LEAST     98 TO 99%.  THE TEST RESULT     SHOULD BE CORRELATED WITH     AN ASSESSMENT OF THE CLINICAL     PROBABILITY OF DVT / VTE.  PROTIME-INR     Status: Abnormal   Collection Time    06/24/13  6:26 PM      Result Value Range   Prothrombin Time 56.6 (*) 11.6 - 15.2 seconds   INR 6.87 (*) 0.00 - 1.49   Comment: REPEATED TO VERIFY     CRITICAL RESULT CALLED TO, READ BACK BY AND VERIFIED WITH:     HOLLY HENSON,RN AT 1932 06/24/13 BY ZPERRY.  POCT INR     Status: None   Collection Time    06/27/13 10:21 AM  Result Value Range   INR 5.6     Comment: hold dose next two days return wed  8/27 for repeat inr  POCT INR     Status: None   Collection Time    06/29/13  9:35 AM      Result Value Range   INR 2.6     Comment: continue 5 mg coumadin-follow up in a week  POCT INR     Status: None   Collection Time    07/06/13  9:31 AM      Result Value Range   INR 6.4    POCT INR     Status: None   Collection Time    07/08/13 11:25 AM      Result Value Range   INR 5.7     Comment: hold coumadin and return on 07/12/15 repeat INR  POCT INR     Status: None   Collection Time    07/11/13 10:03 AM      Result Value Range   INR 2.2     Comment: continue with 5 mg repeat INR two weeks appt given  POCT INR     Status: None   Collection Time    07/25/13  9:47 AM      Result Value Range   INR >8.0    LIPID PANEL     Status: Abnormal   Collection Time    07/25/13 10:23 AM      Result Value Range   Cholesterol 281 (*) 0 - 200 mg/dL   Comment: ATP III Classification:           < 200        mg/dL        Desirable          200 - 239     mg/dL        Borderline High          >= 240        mg/dL        High         Triglycerides 438 (*) <150 mg/dL   HDL 46  >16 mg/dL   Total CHOL/HDL Ratio 6.1     VLDL NOT CALC  0 - 40 mg/dL   Comment:        Not calculated due to Triglyceride >400.     Suggest ordering Direct LDL (Unit Code: 10960).   LDL Cholesterol    0 - 99 mg/dL   Comment:       Not calculated due to Triglyceride >400.     Suggest ordering Direct LDL (Unit Code: 45409).           Total Cholesterol/HDL Ratio:CHD Risk                            Coronary Heart Disease Risk Table                                            Men       Women              1/2 Average Risk              3.4        3.3  Average Risk              5.0        4.4               2X Average Risk              9.6        7.1               3X Average Risk             23.4       11.0     Use the calculated Patient Ratio above and the CHD Risk table      to determine the patient's CHD Risk.     ATP III Classification (LDL):           < 100        mg/dL         Optimal          100 - 129     mg/dL         Near or Above Optimal          130 - 159     mg/dL         Borderline High          160 - 189     mg/dL         High           > 190        mg/dL         Very High        POCT INR     Status: None   Collection Time    07/27/13 12:54 PM      Result Value Range   INR >8.0    POCT INR     Status: None   Collection Time    07/29/13 12:49 PM      Result Value Range   INR 1.8     Comment: RESULTS GIVEN PER DR. Elisabeth Pigeon  POCT INR     Status: Normal   Collection Time    08/01/13 10:42 AM      Result Value Range   INR 2.1      RADIOGRAPHIC STUDIES: I have personally reviewed the radiological images as listed and agreed with the findings in the report. Mm Digital Screening  07/19/2013   *RADIOLOGY REPORT*  Clinical Data: Screening.  DIGITAL SCREENING BILATERAL MAMMOGRAM WITH CAD  Comparison: 10/05/2009  FINDINGS:  ACR Breast Density Category a:  The breast tissue is almost entirely fatty.  There is no suspicious dominant mass, architectural distortion, or calcification to suggest malignancy.  Images were processed with CAD.   IMPRESSION: No mammographic evidence of malignancy.  A result letter of this screening mammogram will be mailed directly to the patient.  RECOMMENDATION: Screening mammogram in one year. (Code:SM-B-01Y)  BI-RADS CATEGORY 1:  Negative.   Original Report Authenticated By: Cain Saupe, M.D.    ASSESSMENT:  Recurrent DVT, history of PE  PLAN:  #1 recurrent DVT Her first blood clot diagnosed in 2008 was probably provoked by her smoking history. She has not smoked for 2 years. Most recently, she developed another blood clot and after a lot of interrogation, the patient did state she has reduced mobility due to back pain. The patient is not a good candidate for warfarin. Her INR has been running very high and it is dangerous. I recommend switching  her to Lovenox in the short-term until we can get her stabilized and treated adequately with Lovenox. For long-term control, we might have to switch her back to some other oral agents. The problem is the patient has no insurance and all of the newer agents a very expensive. We could consider switching her back to warfarin that she meet tight control of fats and we can potentially monitor her anticoagulation therapy here. At some point in time, Misty Stanley the 3-6 months of treatment, we can hold off her blood thinners and to further workup such as a colonoscopy to rule out malignancy as a precipitating factor. I will order repeat kidney function test to make sure her creatinine function is adequate before we proceed to Lovenox. We'll get her financial counseling today to help her before her medication.  All questions were answered. The patient knows to call the clinic with any problems, questions or concerns. I can certainly see the patient much sooner if necessary. No barriers to learning was detected.  I spent 40 minutes counseling the patient face to face. The total time spent in the appointment was 60 minutes and more than 50% was on counseling.     Kerrick Miler,  Dniyah Grant, MD 08/11/2013 10:41 AM

## 2013-08-12 ENCOUNTER — Encounter: Payer: Self-pay | Admitting: Hematology and Oncology

## 2013-08-12 NOTE — Progress Notes (Signed)
Pharmacy called and left a message that the Lovenox was over 500.00 and her grant is only for 400.00. They have her enough for 2weeks. I sent Tammy [name] an email to see if she can get Lovenox asst???? I received the letter of support from her daughter also.

## 2013-08-22 ENCOUNTER — Telehealth: Payer: Self-pay | Admitting: Hematology and Oncology

## 2013-08-22 ENCOUNTER — Ambulatory Visit (HOSPITAL_BASED_OUTPATIENT_CLINIC_OR_DEPARTMENT_OTHER): Payer: No Typology Code available for payment source | Admitting: Hematology and Oncology

## 2013-08-22 ENCOUNTER — Ambulatory Visit: Payer: No Typology Code available for payment source | Attending: Internal Medicine | Admitting: Internal Medicine

## 2013-08-22 ENCOUNTER — Encounter: Payer: Self-pay | Admitting: Internal Medicine

## 2013-08-22 VITALS — BP 131/81 | HR 97 | Temp 97.5°F | Resp 20 | Ht 65.0 in | Wt 190.6 lb

## 2013-08-22 VITALS — BP 119/80 | HR 91 | Temp 98.9°F | Resp 16 | Ht 65.0 in | Wt 191.0 lb

## 2013-08-22 DIAGNOSIS — M25559 Pain in unspecified hip: Secondary | ICD-10-CM | POA: Insufficient documentation

## 2013-08-22 DIAGNOSIS — Z86711 Personal history of pulmonary embolism: Secondary | ICD-10-CM | POA: Insufficient documentation

## 2013-08-22 DIAGNOSIS — IMO0002 Reserved for concepts with insufficient information to code with codable children: Secondary | ICD-10-CM

## 2013-08-22 DIAGNOSIS — R58 Hemorrhage, not elsewhere classified: Secondary | ICD-10-CM | POA: Insufficient documentation

## 2013-08-22 DIAGNOSIS — G4730 Sleep apnea, unspecified: Secondary | ICD-10-CM | POA: Insufficient documentation

## 2013-08-22 DIAGNOSIS — I82409 Acute embolism and thrombosis of unspecified deep veins of unspecified lower extremity: Secondary | ICD-10-CM | POA: Insufficient documentation

## 2013-08-22 DIAGNOSIS — Z79899 Other long term (current) drug therapy: Secondary | ICD-10-CM | POA: Insufficient documentation

## 2013-08-22 DIAGNOSIS — Z856 Personal history of leukemia: Secondary | ICD-10-CM | POA: Insufficient documentation

## 2013-08-22 DIAGNOSIS — R6889 Other general symptoms and signs: Secondary | ICD-10-CM

## 2013-08-22 DIAGNOSIS — Z7901 Long term (current) use of anticoagulants: Secondary | ICD-10-CM | POA: Insufficient documentation

## 2013-08-22 DIAGNOSIS — I82509 Chronic embolism and thrombosis of unspecified deep veins of unspecified lower extremity: Secondary | ICD-10-CM

## 2013-08-22 DIAGNOSIS — I82401 Acute embolism and thrombosis of unspecified deep veins of right lower extremity: Secondary | ICD-10-CM

## 2013-08-22 DIAGNOSIS — R87612 Low grade squamous intraepithelial lesion on cytologic smear of cervix (LGSIL): Secondary | ICD-10-CM | POA: Insufficient documentation

## 2013-08-22 LAB — PROTIME-INR
INR: 1.01
Prothrombin Time: 13.3 s (ref 11.6–15.2)

## 2013-08-22 LAB — APTT: aPTT: 33 s (ref 24–37)

## 2013-08-22 MED ORDER — RIVAROXABAN 20 MG PO TABS: 20.0000 mg | ORAL_TABLET | Freq: Every day | ORAL | Status: DC

## 2013-08-22 NOTE — Progress Notes (Signed)
Pt is here for a f/u visit. Pt is requesting to review her x-ray results. Pt has bruising on her legs and abdomin. Pt states that sometimes she gets a severe pain in her lungs.

## 2013-08-22 NOTE — Telephone Encounter (Signed)
gave pt appt for md in 3 weeks per MD

## 2013-08-22 NOTE — Progress Notes (Signed)
Patient ID: Tammy [name], female   DOB: 01-28-1956, 57 y.o.   MRN: [id number] Patient Demographics  Tammy [name], is a 57 y.o. female  WUJ:[id number]  [id number]  DOB - 01-27-1956  Chief Complaint  Patient presents with  . Follow-up        Subjective:   Tammy [name] is a 57 y.o. female here today for a follow up visit. She saw a hematologist recently who prescribed Lovenox and took her off Coumadin. Since then she has noticed multiple bruises especially on her thighs and abdomen. No frank bleeding from any orifice. She has appointment with the hematologist today. She denies any headache, no visual impairment. The last INR was 2.1 3 weeks ago. She claims to be doing well at home except for the bruises, the bruises are spontaneous, no history of fall contact with any hard object or surface. In the past she has had trouble with INR and therapeutic range, with 5 mg daily she easily goes to INR of 8, and has to be reversed with vitamin K, and quickly goes below therapeutic range with reversal. Her last Pap smear showed LGSIL, she is yet to see a gynecologist for that. Patient has No headache, No chest pain, No abdominal pain - No Nausea, No new weakness tingling or numbness, No Cough - SOB.  ALLERGIES: No Known Allergies  PAST MEDICAL HISTORY: Past Medical History  Diagnosis Date  . DVT (deep venous thrombosis)   . Pulmonary embolism   . Seasonal allergies   . Chronic headaches   . Leukemia     Told she had it at age 78, given some shots    MEDICATIONS AT HOME: Prior to Admission medications   Medication Sig Start Date End Date Taking? Authorizing Provider  acetaminophen (TYLENOL) 500 MG tablet Take 1,000 mg by mouth every 6 (six) hours as needed for pain.    Historical Provider, MD  enoxaparin (LOVENOX) 80 MG/0.8ML injection Inject 0.8 mLs (80 mg total) into the skin every 12 (twelve) hours. 08/11/13   Artis Delay, MD  HYDROcodone-acetaminophen (NORCO/VICODIN) 5-325 MG per tablet  Take 1 tablet by mouth every 4 (four) hours as needed for pain. 06/24/13   Lyanne Co, MD  phytonadione (VITAMIN K) 5 MG tablet Take 1 tablet (5 mg total) by mouth daily. 07/27/13   [name], MD  warfarin (COUMADIN) 5 MG tablet Take 1 tablet (5 mg total) by mouth daily. 06/06/13   Roney Marion, MD     Objective:   Filed Vitals:   08/22/13 1019  BP: 119/80  Pulse: 91  Temp: 98.9 F (37.2 C)  TempSrc: Oral  Resp: 16  Height: 5\' 5"  (1.651 m)  Weight: 191 lb (86.637 kg)  SpO2: 94%    Exam General appearance : Awake, alert, not in any distress. Speech Clear. Not toxic looking HEENT: Atraumatic and Normocephalic, pupils equally reactive to light and accomodation Neck: supple, no JVD. No cervical lymphadenopathy.  Chest:Good air entry bilaterally, no added sounds  CVS: S1 S2 regular, no murmurs.  Abdomen: Bowel sounds present, Non tender and not distended with no gaurding, rigidity or rebound. Multiple bruises seen on the anterior abdominal wall and a site of injection of Lovenox Extremities: B/L Lower Ext shows no edema, both legs are warm to touch. Multiple bruises on both thighs Neurology: Awake alert, and oriented X 3, CN II-XII intact, Non focal Skin:No Rash Wounds:N/A   Data Review   CBC No results found for this basename: WBC, HGB, HCT,  PLT, MCV, MCH, MCHC, RDW, NEUTRABS, LYMPHSABS, MONOABS, EOSABS, BASOSABS, BANDABS, BANDSABD,  in the last 168 hours  Chemistries   No results found for this basename: NA, K, CL, CO2, GLUCOSE, BUN, CREATININE, GFRCGP, CALCIUM, MG, AST, ALT, ALKPHOS, BILITOT,  in the last 168 hours ------------------------------------------------------------------------------------------------------------------ No results found for this basename: HGBA1C,  in the last 72 hours ------------------------------------------------------------------------------------------------------------------ No results found for this basename: CHOL, HDL, LDLCALC, TRIG,  CHOLHDL, LDLDIRECT,  in the last 72 hours ------------------------------------------------------------------------------------------------------------------ No results found for this basename: TSH, T4TOTAL, FREET3, T3FREE, THYROIDAB,  in the last 72 hours ------------------------------------------------------------------------------------------------------------------ No results found for this basename: VITAMINB12, FOLATE, FERRITIN, TIBC, IRON, RETICCTPCT,  in the last 72 hours  Coagulation profile  No results found for this basename: INR, PROTIME,  in the last 168 hours    Assessment & Plan   Patient Active Problem List   Diagnosis Date Noted  . DVT (deep venous thrombosis) 08/22/2013  . LGSIL (low grade squamous intraepithelial lesion) on Pap smear 08/04/2013  . Acute DVT (deep venous thrombosis) 07/08/2013  . Hip pain 07/08/2013  . Sleep apnea 05/21/2011  . Dyspnea 03/11/2011     Plan: PT and INR APTT  Immediate referral to her hematologist this afternoon  Patient to hold on Lovenox until we see the results of APTT  Ambulatory referral to gynecologist for LGSIL on Pap smear  Follow up in one week for INR check  I discussed with patient's daughter on the phone, we went through the current diagnosis and medications, Coumadin and INR, Lovenox and its followup, and the need to see hematologist today as scheduled.   The patient was given clear instructions to go to ER or return to medical center if symptoms don't improve, worsen or new problems develop. The patient verbalized understanding. The patient was told to call to get lab results if they haven't heard anything in the next week.    [name], MD, MHA, FACP, FAAP [facility] [place], [place] [phone]   08/22/2013, 10:44 AM

## 2013-08-22 NOTE — Progress Notes (Signed)
[name] OFFICE PROGRESS NOTE  Tammy Lewandowsky, MD DIAGNOSIS:  Recurrent DVT  SUMMARY OF HEMATOLOGIC HISTORY: Please see my detailed dictation from 08/12/2003 team for further details Since then she was started on Lovenox at 80 mg twice a day injection INTERVAL HISTORY: Tammy [name] 57 y.o. female returns for further followup. She denies any further leg swelling or pain. She does still have intermittent chest wall discomfort. Denies any shortness of breath. She had extensive bruising but no evidence of spontaneous bleeding such as epistaxis, hematuria, or hematochezia  I have reviewed the past medical history, past surgical history, social history and family history with the patient and they are unchanged from previous note.  ALLERGIES:  has No Known Allergies.  MEDICATIONS:  Current Outpatient Prescriptions  Medication Sig Dispense Refill  . acetaminophen (TYLENOL) 500 MG tablet Take 1,000 mg by mouth every 6 (six) hours as needed for pain.      Marland Kitchen enoxaparin (LOVENOX) 80 MG/0.8ML injection Inject 0.8 mLs (80 mg total) into the skin every 12 (twelve) hours.  60 Syringe  0  . HYDROcodone-acetaminophen (NORCO/VICODIN) 5-325 MG per tablet Take 1 tablet by mouth every 4 (four) hours as needed for pain.  15 tablet  0  . phytonadione (VITAMIN K) 5 MG tablet Take 1 tablet (5 mg total) by mouth daily.  2 tablet  0  . Rivaroxaban (XARELTO) 20 MG TABS tablet Take 1 tablet (20 mg total) by mouth daily.  30 tablet  3  . warfarin (COUMADIN) 5 MG tablet Take 1 tablet (5 mg total) by mouth daily.  30 tablet  0   No current facility-administered medications for this visit.     REVIEW OF SYSTEMS:   Constitutional: Denies fevers, chills or night sweats Behavioral/Psych: Mood is stable, no new changes  All other systems were reviewed with the patient and are negative.  PHYSICAL EXAMINATION: ECOG PERFORMANCE STATUS: 1 - Symptomatic but completely ambulatory  Filed Vitals:   08/22/13 1549  BP: 131/81  Pulse: 97  Temp: 97.5 F (36.4 C)  Resp: 20   Filed Weights   08/22/13 1549  Weight: 190 lb 9.6 oz (86.456 kg)    GENERAL:alert, no distress and comfortable. Patient is morbidly obese. SKIN: skin color, texture, turgor are normal, no rashes or significant lesions. Extensive bruises are noted Musculoskeletal:no cyanosis of digits and no clubbing  NEURO: alert & oriented x 3 with fluent speech, no focal motor/sensory deficits  LABORATORY DATA:  I have reviewed the data as listed RADIOGRAPHIC STUDIES: I have personally reviewed the radiological images as listed and agreed with the findings in the report.  ASSESSMENT:  Recurrent DVT Extensive bruising  PLAN:  #1 recurrent DVT The patient did not do well with warfarin due to difficulties controlling her INR. I did offer the patient possibilities of following up with me in the Coumadin clinic here. Alternatively, we can transition her to Xarelto I did discuss with her the difficulties with managing patient's INR on warfarin which she have encountered before but am willing to try it. With Xarelto, the risk of bleeding is about 3% and the patient is aware there is no antidote to reverse it should she bleed  We can still support her with supportive care. Lovenox is not a long-term solution for her. She bruises easily and the injections is causing significant discomfort. It is also very expensive. Ultimately, after a prolonged discussion, she is in agreement to proceed with several toe. I will give her 40  mg daily by mouth. I will see her back in 3 weeks for further assessment and evaluation for side effects.   All questions were answered. The patient knows to call the clinic with any problems, questions or concerns. No barriers to learning was detected.    Tammy Dambrosia, MD 08/22/2013 4:20 PM

## 2013-08-25 ENCOUNTER — Encounter: Payer: Self-pay | Admitting: Hematology and Oncology

## 2013-08-25 NOTE — Progress Notes (Signed)
[employer] approved patient to receive free lovenox from 08/24/13-08/24/14.

## 2013-08-29 ENCOUNTER — Ambulatory Visit: Payer: No Typology Code available for payment source | Attending: Internal Medicine

## 2013-08-29 VITALS — BP 114/69 | HR 87 | Temp 98.3°F | Resp 18 | Wt 193.0 lb

## 2013-08-29 DIAGNOSIS — I82409 Acute embolism and thrombosis of unspecified deep veins of unspecified lower extremity: Secondary | ICD-10-CM

## 2013-08-29 DIAGNOSIS — Z09 Encounter for follow-up examination after completed treatment for conditions other than malignant neoplasm: Secondary | ICD-10-CM | POA: Insufficient documentation

## 2013-08-29 DIAGNOSIS — I82401 Acute embolism and thrombosis of unspecified deep veins of right lower extremity: Secondary | ICD-10-CM

## 2013-08-29 LAB — POCT INR: INR: 1.1

## 2013-08-29 NOTE — Progress Notes (Signed)
Pt is here for her PT/INR Voices no new concerns Denies: abn bleeding/bruising Taking Xarelto 20mg  QD Alert w/no signs of acute distress.   Dr. Hyman Hopes spoke w/pt in regards to results of 1.1 Pt is on Xarelto so there is no need for her to come and check her PT/INR.

## 2013-09-05 ENCOUNTER — Encounter: Payer: Self-pay | Admitting: Obstetrics and Gynecology

## 2013-09-05 ENCOUNTER — Other Ambulatory Visit (HOSPITAL_COMMUNITY)
Admission: RE | Admit: 2013-09-05 | Discharge: 2013-09-05 | Disposition: A | Discharge status: Home / Self Care | Payer: No Typology Code available for payment source | Source: Ambulatory Visit | Attending: Obstetrics and Gynecology | Admitting: Obstetrics and Gynecology

## 2013-09-05 ENCOUNTER — Ambulatory Visit (INDEPENDENT_AMBULATORY_CARE_PROVIDER_SITE_OTHER): Payer: No Typology Code available for payment source | Admitting: Obstetrics and Gynecology

## 2013-09-05 VITALS — BP 121/83 | HR 81 | Temp 96.9°F | Ht 63.0 in | Wt 190.8 lb

## 2013-09-05 DIAGNOSIS — R87612 Low grade squamous intraepithelial lesion on cytologic smear of cervix (LGSIL): Secondary | ICD-10-CM

## 2013-09-05 DIAGNOSIS — N879 Dysplasia of cervix uteri, unspecified: Secondary | ICD-10-CM | POA: Insufficient documentation

## 2013-09-05 DIAGNOSIS — IMO0002 Reserved for concepts with insufficient information to code with codable children: Secondary | ICD-10-CM

## 2013-09-05 NOTE — Progress Notes (Signed)
Patient ID: Tammy [name], female   DOB: 1956-03-06, 57 y.o.   MRN: [id number] 57 yo with LGSIL on 07/2013 pap smear here for colposcopy  Patient given informed consent, signed copy in the chart, time out was performed.  Placed in lithotomy position. Cervix viewed with speculum and colposcope after application of acetic acid.   Colposcopy adequate?  TZ not visualized Acetowhite lesions?yes 12 o'clock Punctation?no Mosaicism?  no Abnormal vasculature?  no Biopsies?yes 12 o'clock ECC?could not be performed due to stenotic os. Cyto- brush was used  COMMENTS: Patient was given post procedure instructions.  She will return in 2 weeks for results.

## 2013-09-07 ENCOUNTER — Encounter: Payer: Self-pay | Admitting: *Deleted

## 2013-09-12 ENCOUNTER — Telehealth: Payer: Self-pay | Admitting: Hematology and Oncology

## 2013-09-12 ENCOUNTER — Ambulatory Visit (HOSPITAL_BASED_OUTPATIENT_CLINIC_OR_DEPARTMENT_OTHER): Payer: No Typology Code available for payment source | Admitting: Hematology and Oncology

## 2013-09-12 VITALS — BP 125/79 | HR 89 | Temp 97.9°F | Resp 18 | Ht 63.0 in | Wt 195.8 lb

## 2013-09-12 DIAGNOSIS — I82401 Acute embolism and thrombosis of unspecified deep veins of right lower extremity: Secondary | ICD-10-CM

## 2013-09-12 DIAGNOSIS — I82409 Acute embolism and thrombosis of unspecified deep veins of unspecified lower extremity: Secondary | ICD-10-CM

## 2013-09-12 NOTE — Telephone Encounter (Signed)
per 11/10 POF made appt for end Dec AVS and Cal given to pt shh

## 2013-09-12 NOTE — Progress Notes (Signed)
[facility] OFFICE PROGRESS NOTE  [name], [name], MD DIAGNOSIS:  Recurrent DVT SUMMARY OF HEMATOLOGIC HISTORY: According to the patient, she was first diagnosed with blood clot in 2008. According to the patient it was unprovoked. The patient complained of shortness of breath and cough and chest discomfort. She was smoking at that time. She had imaging study of the lung done which show evidence of PE. According to the patient she was on a blood thinner for 2 years. The data of the CT scan was 06/24/2007. Recently, she started to complain of intermittent sharp discomfort on the right leg associated with some mild swelling. She states that she had pain on the left leg as well but is not as severe compared to the right leg. She went to the emergency department. Results of the ultrasound venous Doppler came back consistent with acute deep vein thrombosis involving the posterior tibial and perioneal vein of the right lower extremity. D-dimer was elevated. She was placed on Lovenox and transition to warfarin 5 mg daily. Throughout the last 2 months, her INR has been supratherapeutic requiring vitamin K and multiple dose interruption to keep her INR at the desire range at 2-3. She denies any bleeding complications such as spontaneous epistaxis, hematuria, hematochezia, or the need for blood transfusions. She had mild intermittent chest discomfort throughout this and had imaging study done which show possible new clot in the left lung on the CT scan from September 2014. October 2014, Lovenox was discontinued and switched to Xarelto INTERVAL HISTORY: Tammy [name] 57 y.o. female returns for further followup. She is doing great. The patient denies any recent signs or symptoms of bleeding such as spontaneous epistaxis, hematuria or hematochezia. Her previous bruising has resolved. She had a Pap smear done recently is still pending.  I have reviewed the past medical history, past surgical  history, social history and family history with the patient and they are unchanged from previous note.  ALLERGIES:  has No Known Allergies.  MEDICATIONS:  Current Outpatient Prescriptions  Medication Sig Dispense Refill  . acetaminophen (TYLENOL) 500 MG tablet Take 1,000 mg by mouth every 6 (six) hours as needed for pain.      . Rivaroxaban (XARELTO) 20 MG TABS tablet Take 1 tablet (20 mg total) by mouth daily.  30 tablet  3   No current facility-administered medications for this visit.     REVIEW OF SYSTEMS:   Constitutional: Denies fevers, chills or night sweats All other systems were reviewed with the patient and are negative.  PHYSICAL EXAMINATION: ECOG PERFORMANCE STATUS: 0 - Asymptomatic  Filed Vitals:   09/12/13 1418  BP: 125/79  Pulse: 89  Temp: 97.9 F (36.6 C)  Resp: 18   Filed Weights   09/12/13 1418  Weight: 195 lb 12.8 oz (88.814 kg)    GENERAL:alert, no distress and comfortable SKIN: skin color, texture, turgor are normal, no rashes or significant lesions NEURO: alert & oriented x 3 with fluent speech, no focal motor/sensory deficits  LABORATORY DATA:  I have reviewed the data as listed ASSESSMENT:  #1 recurrent DVT #2 difficulties controlling INR  PLAN:  The patient is doing very well on Xarelto. Unfortunately, it cost her a lot of money. She has some assistance to pay for her medications until next month. After that she will run out of her financial means to pay for the medication. She will be stopping by the social services department to get some form of financial assistance to pay for  medications. I plan to see her back in the next month. If she cannot get approval for further financial assistance, we might have to switch her back to warfarin. All questions were answered. The patient knows to call the clinic with any problems, questions or concerns. No barriers to learning was detected.  I spent 15 minutes counseling the patient face to face. The  total time spent in the appointment was 20 minutes and more than 50% was on counseling.     Mohid Furuya, MD 09/12/2013 2:25 PM

## 2013-09-13 ENCOUNTER — Telehealth: Payer: Self-pay

## 2013-09-13 NOTE — Telephone Encounter (Signed)
Called pt. Who was unavailable but spoke with daughter Thornell Mule (OK to give information to her according to release of information form). Informed Misty Stanley that her mom's colposcopy results were normal and that patient should follow up in six months (May) for a pap smear. Informed her to advise her mom to call for an appointment in April. Daughter verbalized understanding stated she had no further questions and will inform her mother of the news.

## 2013-09-13 NOTE — Telephone Encounter (Signed)
Message copied by Louanna Raw on Tue Sep 13, 2013  4:12 PM ------      Message from: CONSTANT, Gigi Gin      Created: Tue Sep 13, 2013  1:13 PM       Please inform patient of normal colposcopy results and need for follow up pap smear in 6 months            Thanks            Peggy ------

## 2013-09-14 ENCOUNTER — Telehealth: Payer: Self-pay | Admitting: General Practice

## 2013-09-14 NOTE — Telephone Encounter (Signed)
Pt returned our call and stated that we may leave a message on her voice mail.

## 2013-09-14 NOTE — Telephone Encounter (Signed)
Patient called and left message stating she is returning a phone call that she missed from yesterday. Called patient, no answer- left message stating we are trying to return your phone call, please call us back at the clinics and let us know if we can leave detailed information on your voicemail

## 2013-09-15 NOTE — Telephone Encounter (Signed)
Called pt and informed pt of her colpo results and that per provider she needs to come in 6 months for a repeat pap.  I informed pt that we would cancel her appt for 10/12/13 since she already had her colpo.  Pt stated understanding.

## 2013-10-12 ENCOUNTER — Encounter: Payer: No Typology Code available for payment source | Admitting: Obstetrics & Gynecology

## 2013-10-31 ENCOUNTER — Ambulatory Visit: Payer: No Typology Code available for payment source | Admitting: Hematology and Oncology

## 2013-12-05 IMAGING — CT CT ANGIO CHEST
1 of 9 series · 17 of 36 positions shown · IV contrast (omnipaque)
Comparison: CT chest with 07/04/2007; 12/24/2010; 09/02/2012.

CLINICAL DATA: Patient with history of prior pulmonary embolism.
Shortness of breath.  Left chest pain.

CT ANGIOGRAPHY CHEST
TECHNIQUE: Multidetector CT imaging of the chest using the
standard protocol during bolus administration of intravenous
contrast. Multiplanar reconstructed images including MIPs were
obtained and reviewed to evaluate the vascular anatomy.
Contrast: 100mL OMNIPAQUE IOHEXOL 350 MG/ML SOLN

[Series 7: pe thins · axial · 0.54mm/px · z∈[+859,+1082]mm · 17 of 251 slices shown]
[im 14/251  lung]
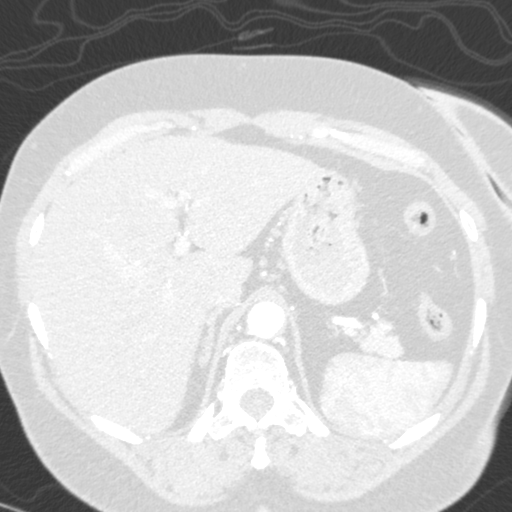
[im 28/251  mediastinal]
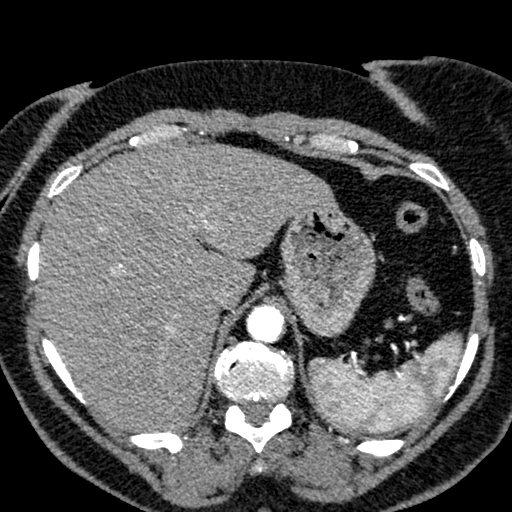
[im 42/251  lung]
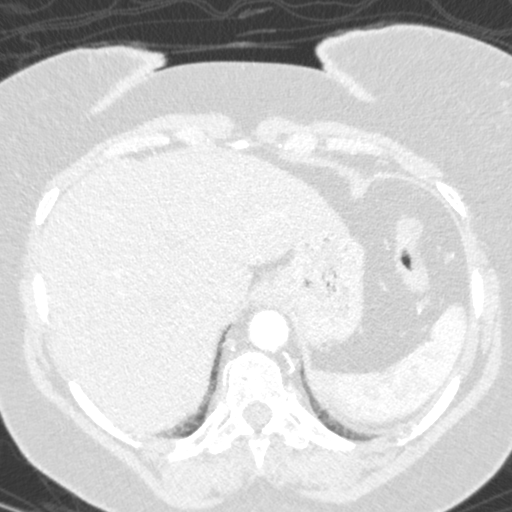
[im 56/251  mediastinal]
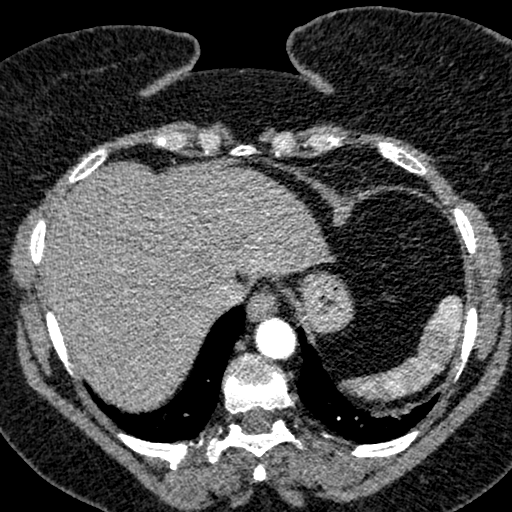
[im 70/251  lung]
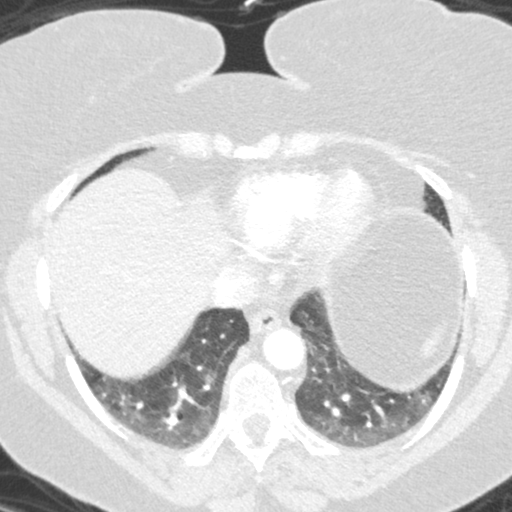
[im 84/251  mediastinal]
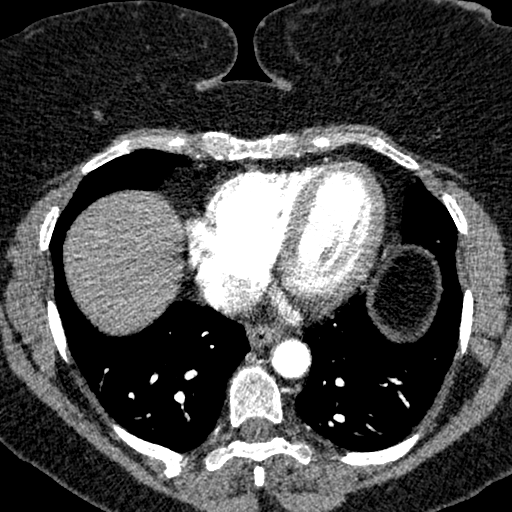
[im 98/251  lung]
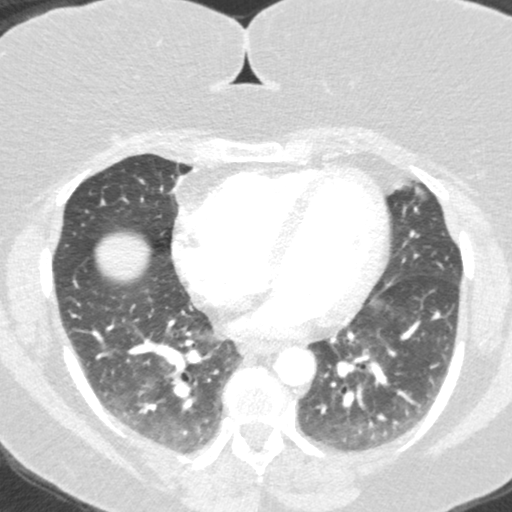
[im 112/251  mediastinal]
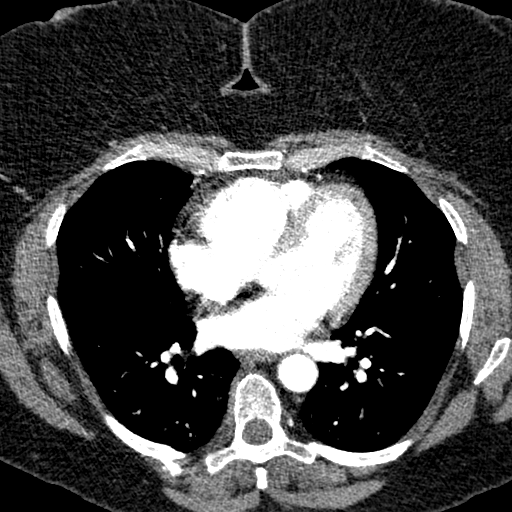
[im 126/251  lung]
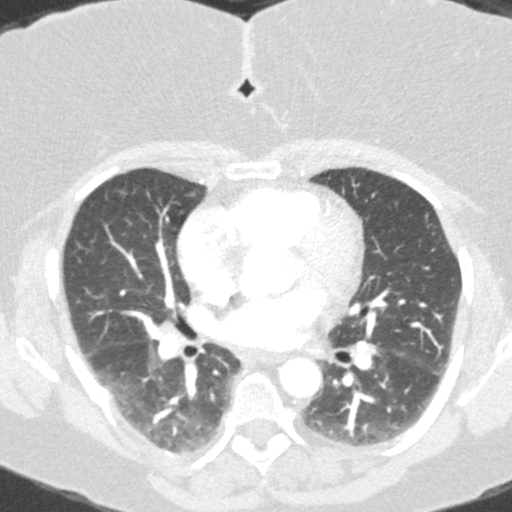
[im 139/251  mediastinal]
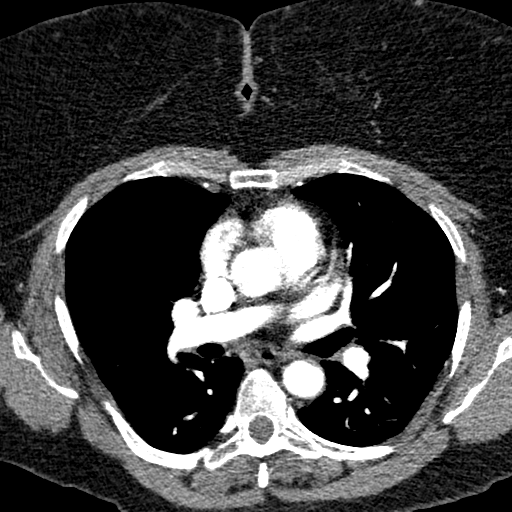
[im 153/251  lung]
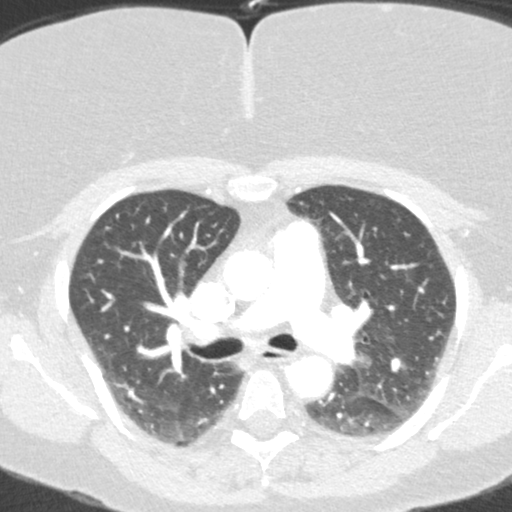
[im 167/251  mediastinal]
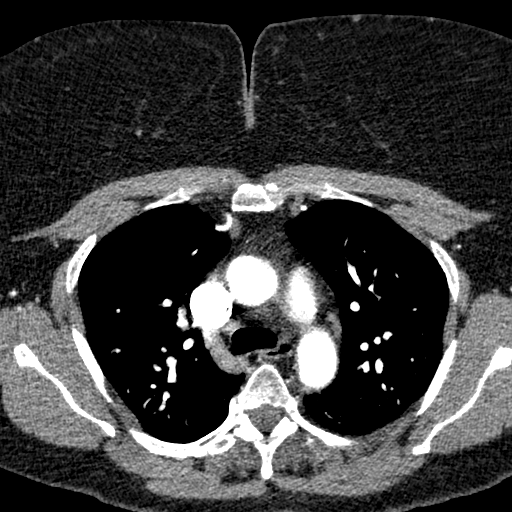
[im 181/251  lung]
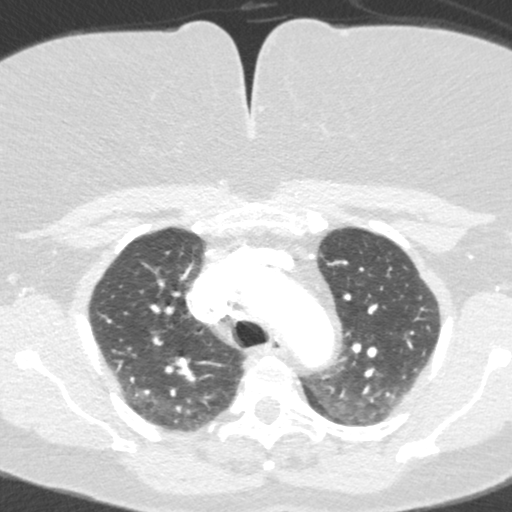
[im 195/251  mediastinal]
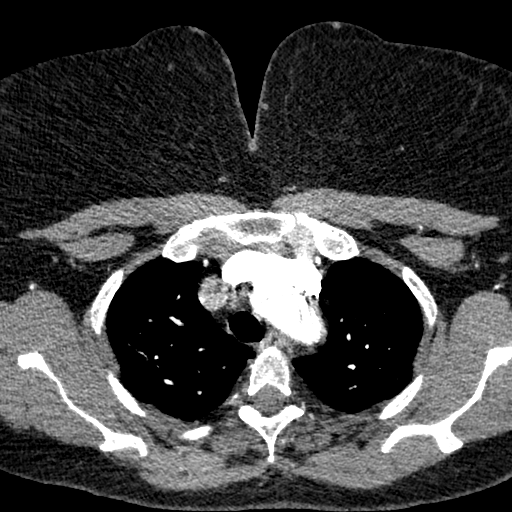
[im 209/251  lung]
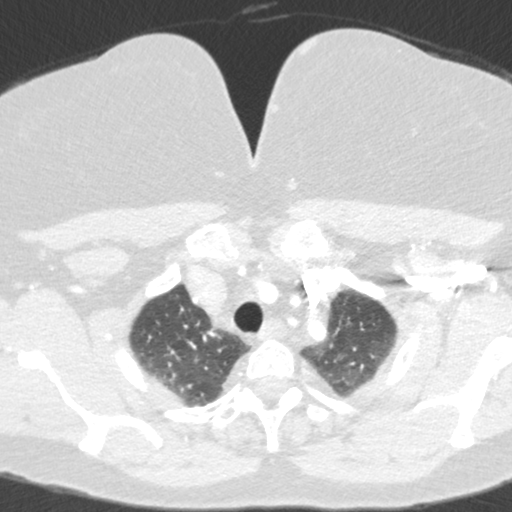
[im 223/251  mediastinal]
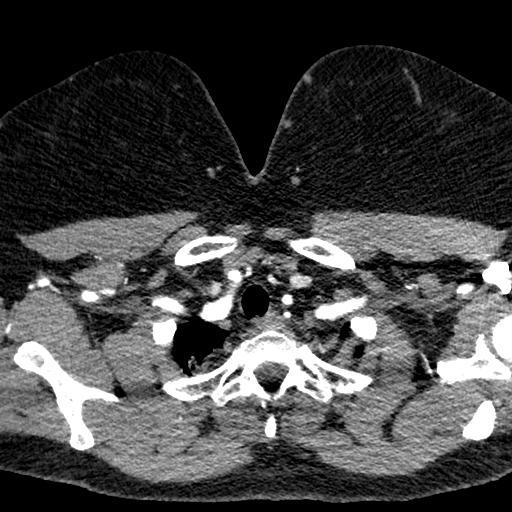
[im 237/251  lung]
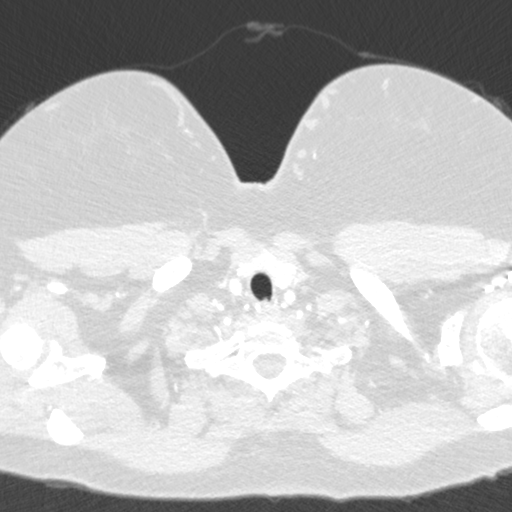

[17 of 36 positions shown; findings below may reference images not displayed]

FINDINGS: Adequate opacification of the pulmonary arteries. There
is a filling defect demonstrated within a right upper lobe
subsegmental pulmonary artery just distal to the branch point best
seen on images 78 - 86.  No additional filling defects are
identified.

Visualized neck base is unremarkable.  No enlarged axillary,
mediastinal or hilar lymphadenopathy.  Normal heart size.  No
pericardial effusion.

The central airways are patent.  There is mild ground-glass opacity
within the dependent aspect of the bilateral lower lobes.  No
pleural effusion or pneumothorax.  Minimal scarring within the
lingula.

Incidental imaging of the upper abdomen demonstrates no focal
abnormalities.  No aggressive appearing osseous lesions.  Multiple
old right-sided rib fractures are redemonstrated.
IMPRESSION: 1. Filling defect within a right upper lobe subsegmental pulmonary
artery is concerning for pulmonary embolus.  Given the small size
and possible peripheral nature, this is age indeterminate and may
potentially be chronic however was not demonstrated on prior CT
chest 09/02/2012.

Critical Value/emergent results were called by telephone at the
time of interpretation on July 11, 2013 at [DATE] p.m. to Dr.
Rudi, who verbally acknowledged these results.

## 2013-12-15 ENCOUNTER — Encounter: Payer: Self-pay | Admitting: Hematology and Oncology

## 2013-12-15 NOTE — Progress Notes (Signed)
I called patient back to advise of J&J for possible asst with med. She gave me daughter Tammy [name] [phone] ph to call and she is going to fax me letter of support for application for her mom.

## 2013-12-22 ENCOUNTER — Ambulatory Visit
Admit: 2013-12-22 | Discharge: 2013-12-22 | Payer: PRIVATE HEALTH INSURANCE | Attending: Internal Medicine | Primary: Family Medicine

## 2013-12-22 MED ORDER — AMLODIPINE 5 MG TAB
5 mg | ORAL_TABLET | Freq: Every day | ORAL | Status: DC
Start: 2013-12-22 — End: 2014-01-24

## 2013-12-22 MED ORDER — HYDROCHLOROTHIAZIDE 12.5 MG CAP
12.5 mg | ORAL_CAPSULE | Freq: Every day | ORAL | Status: DC
Start: 2013-12-22 — End: 2014-01-05

## 2013-12-22 NOTE — Progress Notes (Addendum)
[facility]  Esperanza Richters, MD, [address], [place]  [place] [address]  Phone:  [phone]  Fax: [phone]       Chief Complaint   Patient presents with   ??? Headache   .      SUBJECTIVE:    Vanessa [name] is a 58 y.o. female *Patient returns today for follow-up with a known history of primary hypertension, depressive disorder, mild hepatic steatosis, and is seen for evaluation.  Patient comes in today because at work she found her blood pressure to be last night it was 170/110 and this morning at work systolic was in the 932'T.  She states she has not been taking her blood pressure pills.  She stopped the blood pressure pills and started taking homeopathic remedies and vinegar and garlic.  Patient also complains of hot flashes.  Sporadically she notes a pinprick sensation all over her body.  There is a band-like sensation in her right upper arm in the deltoid area.  It is associated with pain and numbness.  She also has the sensation of going to sleep in her legs and arms.  She complains of severe neck pain.  She complains of indigestion and has the sensation of being full all the time.  She complains of head pressure as well as a chest pressure sensation.  She has had a CT scan of the head in the past.  She has been under the care of Dr. Landis Gandy Aralu for the headaches.  He raised the question of rheumatoid arthritis.  She has had an MRI of her neck that was also performed at [facility].  The MRI of the cervical spine revealed normal cord signal, minimal degenerative changes at C5-6 and C6 levels without significant canal or foraminal narrowing.  MRI of the brain revealed a few small high density in the deep white matter bilaterally which was nonspecific but was felt to be within normal limits for her age.  These studies were also ordered by her neurologist, Cletus Aralu, MD.             Current Outpatient Prescriptions   Medication Sig Dispense Refill   ??? DULoxetine  (CYMBALTA) 30 mg capsule        ??? amLODIPine (NORVASC) 5 mg tablet Take 1 Tab by mouth daily.  30 Tab  8   ??? hydrochlorothiazide (MICROZIDE) 12.5 mg capsule Take 1 Cap by mouth daily.  30 Cap  11   ??? therapeutic multivitamin (THERAGRAN) tablet Take 1 Tab by mouth daily.         Past Medical History   Diagnosis Date   ??? Hypertension    ??? Normal cardiac stress test 02-10-12   ??? Hepatic steatosis    ??? Depression      Past Surgical History   Procedure Laterality Date   ??? Hx gyn       tubal ligation     No Known Allergies      REVIEW OF SYSTEMS:  General: negative for - chills or fever  ENT: negative for - headaches, nasal congestion or tinnitus  Respiratory: negative for - cough, hemoptysis, shortness of breath or wheezing  Cardiovascular : negative for - chest pain, edema, palpitations or shortness of breath  Gastrointestinal: negative for - abdominal pain, blood in stools, heartburn or nausea/vomiting  Genito-Urinary: no dysuria, trouble voiding, or hematuria  Musculoskeletal: negative for - gait disturbance, joint pain, joint stiffness or joint swelling  Neurological: no TIA or stroke symptoms  Hematologic: no bruises, no bleeding, no swollen glands  Integument: no lumps, mole changes, nail changes or rash  Endocrine: no malaise/lethargy or unexpected weight changes      History     Social History   ??? Marital Status: MARRIED     Spouse Name: N/A     Number of Children: N/A   ??? Years of Education: N/A     Social History Main Topics   ??? Smoking status: Never Smoker    ??? Smokeless tobacco: Never Used   ??? Alcohol Use: No   ??? Drug Use: No   ??? Sexual Activity:     Partners: Male     Patent examiner Protection: None     Other Topics Concern   ??? Not on file     Social History Narrative     Family History   Problem Relation Age of Onset   ??? Heart Disease Mother    ??? Heart Disease Father        OBJECTIVE:    BP 178/110    Pulse 91    Temp(Src) 96.7 ??F (35.9 ??C) (Oral)    Resp 19    Ht 5' 7"  (1.702 m)    Wt 167 lb (75.751 kg)     BMI 26.15 kg/m2      SpO2 97%   CONSTITUTIONAL: well , well nourished, appears age appropriate  EYES: perrla, eom intact  ENMT:moist mucous membranes, pharynx clear  NECK: supple. Thyroid normal  RESPIRATORY: Chest: clear to ascultation and percussion   CARDIOVASCULAR: Heart: regular rate and rhythm  GASTROINTESTINAL: Abdomen: soft, bowel sounds active  HEMATOLOGIC: no pathological lymph nodes palpated  MUSCULOSKELETAL: Extremities: no edema, pulse 1+   INTEGUMENT: No unusual rashes or suspicious skin lesions noted. Nails appear normal.  NEUROLOGIC: non-focal exam   MENTAL STATUS: alert and oriented, appropriate affect      ASSESSMENT:  1. Hypertension    2. Low back pain    3. Depression    4. Anxiety and depression    5. Headache      Blood pressure control is inadequate related to the patient's decision to stop her blood pressure pills and use homeopathic remedies.  We suggest she restart the Amlodipine since she had no ill effects from it.  I suspect that will be adequate at controlling her blood pressure.  We will do a rheumatological serologic evaluation to exclude the diagnosis of rheumatoid arthritis.  We will also suggest that she take the Hydrochlorothiazide once a day with orange juice.  She will keep her scheduled appointments with Dr. Redmond Pulling and continue on the Cymbalta for her nerves.  We will ask her to come back in two to four weeks for a blood pressure check.          PLAN:  .  Orders Placed This Encounter   ??? CBC WITH AUTOMATED DIFF   ??? METABOLIC PANEL, BASIC   ??? SED RATE (ESR)   ??? URIC ACID   ??? CYCLIC CITRUL PEPTIDE AB, IGG   ??? RHEUMATOID FACTOR   ??? ANA W/REFLEX   ??? HEMOGLOBIN A1C   ??? VITAMIN D, 25 HYDROXY   ??? VITAMIN B12 & FOLATE   ??? REFERRAL TO PSYCHIATRY   ??? DULoxetine (CYMBALTA) 30 mg capsule   ??? DISCONTD: TAMIFLU 75 mg capsule   ??? amLODIPine (NORVASC) 5 mg tablet   ??? hydrochlorothiazide (MICROZIDE) 12.5 mg capsule       Follow-up  Disposition: Not on File      ATTENTION:   This medical record  was transcribed using an electronic medical records system.  Although proofread, it may and can contain electronic and spelling errors.  Other human spelling and other errors may be present.  Corrections may be executed at a later time.  Please feel free to contact us for any clarifications as needed.

## 2013-12-22 NOTE — Addendum Note (Signed)
Addended by: Cristine PolioHAITHCOCK, Ison Wichmann E on: 12/22/2013 02:07 PM     Modules accepted: Orders

## 2013-12-22 NOTE — Addendum Note (Signed)
Addended by: Cristine PolioHAITHCOCK, Elianna Windom E on: 12/22/2013 02:04 PM     Modules accepted: Orders

## 2013-12-24 LAB — ANA, RFLX TO 5-BIOMARKER PROFILE(ENA)
ANA, DIRECT: NEGATIVE
Antinuclear Antibodies Direct: NEGATIVE

## 2013-12-24 LAB — RHEUMATOID FACTOR
Rheumatoid Factor: 16.9 IU/mL — ABNORMAL HIGH (ref 0.0–13.9)
Rheumatoid factor: 16.9 IU/mL — ABNORMAL HIGH (ref 0.0–13.9)

## 2013-12-24 LAB — METABOLIC PANEL, BASIC
BUN/Creatinine ratio: 18 (ref 9–23)
BUN: 13 mg/dL (ref 6–24)
CO2: 27 mmol/L (ref 18–29)
Calcium: 10.2 mg/dL (ref 8.7–10.2)
Chloride: 100 mmol/L (ref 97–108)
Creatinine: 0.71 mg/dL (ref 0.57–1.00)
GFR est AA: 110 mL/min/{1.73_m2} (ref >59)
GFR est non-AA: 96 mL/min/{1.73_m2} (ref >59)
Glucose: 78 mg/dL (ref 65–99)
Potassium: 4.4 mmol/L (ref 3.5–5.2)
Sodium: 141 mmol/L (ref 134–144)

## 2013-12-24 LAB — CBC WITH AUTOMATED DIFF
ABS. BASOPHILS: 0 10*3/uL (ref 0.0–0.2)
ABS. EOSINOPHILS: 0.1 10*3/uL (ref 0.0–0.4)
ABS. IMM. GRANS.: 0 10*3/uL (ref 0.0–0.1)
ABS. MONOCYTES: 0.3 10*3/uL (ref 0.1–0.9)
ABS. NEUTROPHILS: 4.8 10*3/uL (ref 1.4–7.0)
Abs Lymphocytes: 2.2 10*3/uL (ref 0.7–3.1)
BASOPHILS: 1 %
EOSINOPHILS: 1 %
HCT: 47.9 % — ABNORMAL HIGH (ref 34.0–46.6)
HGB: 15.7 g/dL (ref 11.1–15.9)
IMMATURE GRANULOCYTES: 0 %
Lymphocytes: 29 %
MCH: 31 pg (ref 26.6–33.0)
MCHC: 32.8 g/dL (ref 31.5–35.7)
MCV: 95 fL (ref 79–97)
MONOCYTES: 5 %
NEUTROPHILS: 64 %
PLATELET: 397 10*3/uL — ABNORMAL HIGH (ref 150–379)
RBC: 5.07 x10E6/uL (ref 3.77–5.28)
RDW: 14.2 % (ref 12.3–15.4)
WBC: 7.5 10*3/uL (ref 3.4–10.8)

## 2013-12-24 LAB — SED RATE (ESR): Sed rate (ESR): 9 mm/hr (ref 0–40)

## 2013-12-24 LAB — URIC ACID: Uric acid: 5.4 mg/dL (ref 2.5–7.1)

## 2013-12-24 LAB — VITAMIN D, 25 HYDROXY: VITAMIN D, 25-HYDROXY: 24.4 ng/mL — ABNORMAL LOW (ref 30.0–100.0)

## 2013-12-24 LAB — VITAMIN B12 & FOLATE
Folate: 12.1 ng/mL (ref >3.0)
Vitamin B12: 706 pg/mL (ref 211–946)

## 2013-12-24 LAB — CYCLIC CITRUL PEPTIDE AB, IGG: CCP Antibodies IgG/IgA: 3 units (ref 0–19)

## 2013-12-24 LAB — HEMOGLOBIN A1C WITH EAG: Hemoglobin A1c: 5.3 % (ref 4.8–5.6)

## 2013-12-27 MED ORDER — CHOLECALCIFEROL (VITAMIN D3) 2,000 UNIT CAPSULE
ORAL_CAPSULE | Freq: Every day | ORAL | Status: DC
Start: 2013-12-27 — End: 2014-09-06

## 2013-12-27 NOTE — Telephone Encounter (Signed)
Medication sent to pharmacy per providers orders, patient notified by phone and mail.

## 2014-01-04 NOTE — Telephone Encounter (Signed)
Patient called stating that her bp was still elevated at 156/100. She takes amlodipine 5mg  every day.per dr. Colvin Carolibonner patient can take another 5mg  this afternoon and in the morning take 2-5mg  tabs of amlodipine before she comes to the office.

## 2014-01-05 MED ORDER — LOSARTAN-HYDROCHLOROTHIAZIDE 100 MG-12.5 MG TAB
ORAL_TABLET | Freq: Every day | ORAL | Status: DC
Start: 2014-01-05 — End: 2014-01-24

## 2014-01-05 NOTE — Progress Notes (Signed)
.  1. Have you been to the ER, urgent care clinic since your last visit?  Hospitalized since your last visit?No    2. Have you seen or consulted any other health care providers outside of the [facility] since your last visit?  Include any pap smears or colon screening. No

## 2014-01-05 NOTE — Progress Notes (Signed)
[facility]  Vanessa Adaoderick Gennavieve Huq, MD, StepneyFACP, CMD  [address]  [address]   [place] [id number]  Phone:  [phone]  Fax: [phone]       Chief Complaint   Patient presents with   ??? Follow-up     1 month   .      SUBJECTIVE:    Vanessa [name] is a 58 y.o. female Patient returns today for follow-up of primary hypertension, hepatic steatosis, anxiety depressive disorder, and is seen for evaluation.  Patient returns today with a list of her blood pressure readings from February 23rd to March 5th.  They range from a low of 120/90 with a high diastolic as high as 101.  Blood pressure control is obviously better than it was previously but not where we would like to see.  She continues to have headaches, both described as sharp as well as a pressure sensation.  At times they last all day.  She is using Tylenol for the headaches.  Patient also complains of memory loss as well as putting things down and losing items or forgetting where she put them.  Patient complains of right arm pain to the point she cannot move it.  It just hurts.  Patient continues to have emotional outbreaks with episodes of crying related to family situations which she has no control over.  She remains under the care of Arlana HoveJoanne Thomas Wilson, her counselor.  Patient has an appointment with Dr. Julianne Riceashida Gray at the end of this month.             Current Outpatient Prescriptions   Medication Sig Dispense Refill   ??? losartan-hydrochlorothiazide (HYZAAR) 100-12.5 mg per tablet Take 1 Tab by mouth daily.  30 Tab  11   ??? Cholecalciferol, Vitamin D3, (VITAMIN D3) 2,000 unit cap capsule Take 2,000 Units by mouth daily.  30 Cap  11   ??? DULoxetine (CYMBALTA) 30 mg capsule        ??? amLODIPine (NORVASC) 5 mg tablet Take 1 Tab by mouth daily.  30 Tab  8   ??? therapeutic multivitamin (THERAGRAN) tablet Take 1 Tab by mouth daily.         Past Medical History   Diagnosis Date   ??? Hypertension    ??? Normal cardiac stress test 02-10-12   ??? Hepatic  steatosis    ??? Depression      Past Surgical History   Procedure Laterality Date   ??? Hx gyn       tubal ligation     No Known Allergies      REVIEW OF SYSTEMS:  General: negative for - chills or fever  ENT: negative for - headaches, nasal congestion or tinnitus  Respiratory: negative for - cough, hemoptysis, shortness of breath or wheezing  Cardiovascular : negative for - chest pain, edema, palpitations or shortness of breath  Gastrointestinal: negative for - abdominal pain, blood in stools, heartburn or nausea/vomiting  Genito-Urinary: no dysuria, trouble voiding, or hematuria  Musculoskeletal: negative for - gait disturbance, joint pain, joint stiffness or joint swelling  Neurological: no TIA or stroke symptoms  Hematologic: no bruises, no bleeding, no swollen glands  Integument: no lumps, mole changes, nail changes or rash  Endocrine: no malaise/lethargy or unexpected weight changes      History     Social History   ??? Marital Status: MARRIED     Spouse Name: N/A     Number of Children: N/A   ??? Years  of Education: N/A     Social History Main Topics   ??? Smoking status: Never Smoker    ??? Smokeless tobacco: Never Used   ??? Alcohol Use: No   ??? Drug Use: No   ??? Sexual Activity:     Partners: Male     Pharmacist, hospital Protection: None     Other Topics Concern   ??? Not on file     Social History Narrative     Family History   Problem Relation Age of Onset   ??? Heart Disease Mother    ??? Heart Disease Father        OBJECTIVE:    BP 129/83    Pulse 80    Temp(Src) 96 ??F (35.6 ??C) (Oral)    Resp 18    Ht 5\' 7"  (1.702 m)    Wt 164 lb (74.39 kg)    BMI 25.68 kg/m2      SpO2 96%   CONSTITUTIONAL: well , well nourished, appears age appropriate  EYES: perrla, eom intact  ENMT:moist mucous membranes, pharynx clear  NECK: supple. Thyroid normal  RESPIRATORY: Chest: clear to ascultation and percussion   CARDIOVASCULAR: Heart: regular rate and rhythm  GASTROINTESTINAL: Abdomen: soft, bowel sounds active  HEMATOLOGIC: no pathological lymph  nodes palpated  MUSCULOSKELETAL: Extremities: no edema, pulse 1+   INTEGUMENT: No unusual rashes or suspicious skin lesions noted. Nails appear normal.  NEUROLOGIC: non-focal exam   MENTAL STATUS: alert and oriented, appropriate affect      ASSESSMENT:  1. Hypertension    2. Depression    3. Anxiety and depression    4. Headache    5. Hepatic steatosis      Patient is under a great deal of duress related to the fact she has to go to court soon and wonders if she can avoid that medically.  We suggest she talk to Dr. Wallace Cullens for her opinion.  Headaches are directly related to the stressors that she has.  We suggest increase of the Cymbalta which will not only help her depressive episodes but also help her headaches but she prefers not at this time.  We will add an ARB hydrochlorothiazide combination for blood pressure control to induce a normotensive response.  She will return to the office in about a month, sooner if she needs to regarding blood pressure issues.  The headaches are stress induced.  I do not think they are related to her blood pressure.         PLAN:  .  Orders Placed This Encounter   ??? losartan-hydrochlorothiazide (HYZAAR) 100-12.5 mg per tablet       Follow-up Disposition: Not on File      ATTENTION:   This medical record was transcribed using an electronic medical records system.  Although proofread, it may and can contain electronic and spelling errors.  Other human spelling and other errors may be present.  Corrections may be executed at a later time.  Please feel free to contact us for any clarifications as needed.

## 2014-01-19 MED ORDER — DULOXETINE 30 MG CAP, DELAYED RELEASE
30 mg | ORAL_CAPSULE | Freq: Two times a day (BID) | ORAL | Status: DC
Start: 2014-01-19 — End: 2014-02-28
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
## 2014-01-20 NOTE — Telephone Encounter (Signed)
Pt called to report that the new medication that was prescribed to her makes her sleep. Within 30 mins of taking medication she falls asleep, then it takes half a day to become alert. She was prescribed losartan/hydrochlorothiazide 100/12.5 mg on 01/05/14. Best contact for Pt is [phone]

## 2014-01-20 NOTE — Telephone Encounter (Signed)
Returned patient phone call patient should stop taking medication until  01/23/14

## 2014-01-23 NOTE — Telephone Encounter (Signed)
Patient is to come into office tomorrow for a blood pressure check per providers orders, patient was notified that if she experiences any chest pain to go to er

## 2014-01-24 MED ORDER — CARVEDILOL 6.25 MG TAB
6.25 mg | ORAL_TABLET | Freq: Two times a day (BID) | ORAL | Status: DC
Start: 2014-01-24 — End: 2014-02-24

## 2014-01-24 NOTE — Progress Notes (Signed)
comes in for a blood pressure check stating that the current blood pressure medication is making her dizzy, fatigued, and spacey.  We suggest taking the medication at bedtime.  She does not think this is going to work and therefore wants different medications.  We will adjust her medications and ask her to come back in two weeks for a blood pressure check.  We will stop both the Losartan and the Amlodipine and will place her on Carvedilol 6.25 mg b.i.d.

## 2014-02-02 MED ORDER — BUSPIRONE 7.5 MG TAB
7.5 mg | ORAL_TABLET | Freq: Two times a day (BID) | ORAL | Status: DC
Start: 2014-02-02 — End: 2014-03-29
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
## 2014-02-07 NOTE — Progress Notes (Signed)
Patient comes in for repeat blood pressure check per Dr. Oneida AlarHaithcock patient is to stay on same meds and return to the office in 3 months for bp visit.

## 2014-02-08 DIAGNOSIS — G8929 Other chronic pain: Secondary | ICD-10-CM | POA: Insufficient documentation

## 2014-02-08 DIAGNOSIS — Z7901 Long term (current) use of anticoagulants: Secondary | ICD-10-CM | POA: Insufficient documentation

## 2014-02-08 DIAGNOSIS — Z86711 Personal history of pulmonary embolism: Secondary | ICD-10-CM | POA: Insufficient documentation

## 2014-02-08 DIAGNOSIS — Z856 Personal history of leukemia: Secondary | ICD-10-CM | POA: Insufficient documentation

## 2014-02-08 DIAGNOSIS — J069 Acute upper respiratory infection, unspecified: Secondary | ICD-10-CM | POA: Insufficient documentation

## 2014-02-08 DIAGNOSIS — Z87891 Personal history of nicotine dependence: Secondary | ICD-10-CM | POA: Insufficient documentation

## 2014-02-08 DIAGNOSIS — Z86718 Personal history of other venous thrombosis and embolism: Secondary | ICD-10-CM | POA: Insufficient documentation

## 2014-02-09 ENCOUNTER — Emergency Department (HOSPITAL_COMMUNITY)
Admission: EM | Admit: 2014-02-09 | Discharge: 2014-02-09 | Disposition: A | Discharge status: Home / Self Care | Payer: Self-pay | Attending: Emergency Medicine | Admitting: Emergency Medicine

## 2014-02-09 ENCOUNTER — Encounter (HOSPITAL_COMMUNITY): Payer: Self-pay | Admitting: Emergency Medicine

## 2014-02-09 DIAGNOSIS — J069 Acute upper respiratory infection, unspecified: Secondary | ICD-10-CM

## 2014-02-09 MED ORDER — CETIRIZINE-PSEUDOEPHEDRINE ER 5-120 MG PO TB12: 1.0000 | ORAL_TABLET | Freq: Two times a day (BID) | ORAL | Status: DC

## 2014-02-09 MED ORDER — HYDROCOD POLST-CHLORPHEN POLST 10-8 MG/5ML PO LQCR: 5.0000 mL | Freq: Two times a day (BID) | ORAL | Status: DC

## 2014-02-09 MED ORDER — GUAIFENESIN ER 600 MG PO TB12: 600.0000 mg | ORAL_TABLET | Freq: Two times a day (BID) | ORAL | Status: DC

## 2014-02-09 NOTE — ED Notes (Addendum)
Pt c/o sore throat x 2 weeks, cough at night with sore throat. Pt states she is vomit at times with "dots" of blood in emesis, pt states emesis is not phlegm.

## 2014-02-09 NOTE — ED Notes (Signed)
Bed: WA17 Expected date:  Expected time:  Means of arrival:  Comments: Clean/no monitor

## 2014-02-09 NOTE — ED Provider Notes (Signed)
CSN: [id number]     Arrival date & time 02/08/14  2148 History   First MD Initiated Contact with Patient 02/09/14 0211     Chief Complaint  Patient presents with  . Sore Throat   HPI  History provided by the patient. Patient is a 58 year old female who presents with complaints of nasal congestion, cough and sore throat. Symptoms have been present for the past several days. Patient did take some Tylenol with some relief of her headache and sore throat symptoms. She denies any other aggravating or alleviating factors. Denies any known sick contacts. No recent travel. No associated fever, chills or sweats. No chest pain. No other aggravating or alleviating factors. No other associated symptoms.    Past Medical History  Diagnosis Date  . DVT (deep venous thrombosis)   . Pulmonary embolism   . Seasonal allergies   . Chronic headaches   . Leukemia     Told she had it at age 31, given some shots   Past Surgical History  Procedure Laterality Date  . Tubal ligation    . Right arm fracture    . Cesarean section      one previous   Family History  Problem Relation Age of Onset  . Emphysema Father   . Heart disease Sister   . Cancer Sister   . Breast cancer Mother   . Breast cancer Maternal Grandmother    History  Substance Use Topics  . Smoking status: Former Smoker -- 0.40 packs/day for 40 years    Types: Cigarettes    Quit date: 06/25/2011  . Smokeless tobacco: Not on file  . Alcohol Use: No   OB History   Grav Para Term Preterm Abortions TAB SAB Ect Mult Living   7 4 4  0 3 0 2 1 0 4     Review of Systems  Constitutional: Negative for fever, chills and diaphoresis.  HENT: Positive for congestion, rhinorrhea and sore throat.   Respiratory: Positive for cough.   Gastrointestinal: Negative for nausea, vomiting, diarrhea and constipation.  Skin: Negative for rash.  All other systems reviewed and are negative.     Allergies  Review of patient's allergies indicates no  known allergies.  Home Medications   Current Outpatient Rx  Name  Route  Sig  Dispense  Refill  . acetaminophen (TYLENOL) 500 MG tablet   Oral   Take 1,000 mg by mouth every 6 (six) hours as needed for pain.         . Rivaroxaban (XARELTO) 20 MG TABS tablet   Oral   Take 1 tablet (20 mg total) by mouth daily.   30 tablet   3    BP 130/86  Pulse 95  Temp(Src) 98.7 F (37.1 C) (Oral)  Resp 20  Ht 5\' 2"  (1.575 m)  Wt 187 lb (84.823 kg)  BMI 34.19 kg/m2  SpO2 97% Physical Exam  Nursing note and vitals reviewed. Constitutional: She is oriented to person, place, and time. She appears well-developed and well-nourished. No distress.  HENT:  Head: Normocephalic and atraumatic.  Right Ear: Tympanic membrane normal.  Left Ear: Tympanic membrane normal.  Nose: Rhinorrhea present.  Mouth/Throat: Oropharynx is clear and moist. No oropharyngeal exudate.  Eyes: Conjunctivae and EOM are normal. Pupils are equal, round, and reactive to light.  Neck: Normal range of motion. Neck supple.  No meningeal signs  Cardiovascular: Normal rate and regular rhythm.   Pulmonary/Chest: Effort normal and breath sounds normal. No respiratory distress. She  has no wheezes. She has no rales.  Abdominal: Soft. There is no tenderness.  Musculoskeletal: Normal range of motion.  Neurological: She is alert and oriented to person, place, and time.  Skin: Skin is warm and dry. No rash noted.  Psychiatric: She has a normal mood and affect. Her behavior is normal.    ED Course  Procedures  COORDINATION OF CARE:  Nursing notes reviewed. Vital signs reviewed. Initial pt interview and examination performed.   Filed Vitals:   02/08/14 2224  BP: 130/86  Pulse: 95  Temp: 98.7 F (37.1 C)  TempSrc: Oral  Resp: 20  Height: 5\' 2"  (1.575 m)  Weight: 187 lb (84.823 kg)  SpO2: 97%    2:58 AM-patient seen and evaluated. Patient well appearing in no acute distress. Afebrile triage. Does not appear severely  ill or toxic.     MDM   Final diagnoses:  URI (upper respiratory infection)        Martie Lee, PA-C 02/09/14 [phone]

## 2014-02-09 NOTE — Discharge Instructions (Signed)
Please use the medications prescribed to help with your symptoms. Followup with your primary care provider for continued evaluation and treatment.    Upper Respiratory Infection, Adult An upper respiratory infection (URI) is also sometimes known as the common cold. The upper respiratory tract includes the nose, sinuses, throat, trachea, and bronchi. Bronchi are the airways leading to the lungs. Most people improve within 1 week, but symptoms can last up to 2 weeks. A residual cough may last even longer.  CAUSES Many different viruses can infect the tissues lining the upper respiratory tract. The tissues become irritated and inflamed and often become very moist. Mucus production is also common. A cold is contagious. You can easily spread the virus to others by oral contact. This includes kissing, sharing a glass, coughing, or sneezing. Touching your mouth or nose and then touching a surface, which is then touched by another person, can also spread the virus. SYMPTOMS  Symptoms typically develop 1 to 3 days after you come in contact with a cold virus. Symptoms vary from person to person. They may include:  Runny nose.  Sneezing.  Nasal congestion.  Sinus irritation.  Sore throat.  Loss of voice (laryngitis).  Cough.  Fatigue.  Muscle aches.  Loss of appetite.  Headache.  Low-grade fever. DIAGNOSIS  You might diagnose your own cold based on familiar symptoms, since most people get a cold 2 to 3 times a year. Your caregiver can confirm this based on your exam. Most importantly, your caregiver can check that your symptoms are not due to another disease such as strep throat, sinusitis, pneumonia, asthma, or epiglottitis. Blood tests, throat tests, and X-rays are not necessary to diagnose a common cold, but they may sometimes be helpful in excluding other more serious diseases. Your caregiver will decide if any further tests are required. RISKS AND COMPLICATIONS  You may be at risk for  a more severe case of the common cold if you smoke cigarettes, have chronic heart disease (such as heart failure) or lung disease (such as asthma), or if you have a weakened immune system. The very young and very old are also at risk for more serious infections. Bacterial sinusitis, middle ear infections, and bacterial pneumonia can complicate the common cold. The common cold can worsen asthma and chronic obstructive pulmonary disease (COPD). Sometimes, these complications can require emergency medical care and may be life-threatening. PREVENTION  The best way to protect against getting a cold is to practice good hygiene. Avoid oral or hand contact with people with cold symptoms. Wash your hands often if contact occurs. There is no clear evidence that vitamin C, vitamin E, echinacea, or exercise reduces the chance of developing a cold. However, it is always recommended to get plenty of rest and practice good nutrition. TREATMENT  Treatment is directed at relieving symptoms. There is no cure. Antibiotics are not effective, because the infection is caused by a virus, not by bacteria. Treatment may include:  Increased fluid intake. Sports drinks offer valuable electrolytes, sugars, and fluids.  Breathing heated mist or steam (vaporizer or shower).  Eating chicken soup or other clear broths, and maintaining good nutrition.  Getting plenty of rest.  Using gargles or lozenges for comfort.  Controlling fevers with ibuprofen or acetaminophen as directed by your caregiver.  Increasing usage of your inhaler if you have asthma. Zinc gel and zinc lozenges, taken in the first 24 hours of the common cold, can shorten the duration and lessen the severity of symptoms. Pain medicines  may help with fever, muscle aches, and throat pain. A variety of non-prescription medicines are available to treat congestion and runny nose. Your caregiver can make recommendations and may suggest nasal or lung inhalers for other  symptoms.  HOME CARE INSTRUCTIONS   Only take over-the-counter or prescription medicines for pain, discomfort, or fever as directed by your caregiver.  Use a warm mist humidifier or inhale steam from a shower to increase air moisture. This may keep secretions moist and make it easier to breathe.  Drink enough water and fluids to keep your urine clear or pale yellow.  Rest as needed.  Return to work when your temperature has returned to normal or as your caregiver advises. You may need to stay home longer to avoid infecting others. You can also use a face mask and careful hand washing to prevent spread of the virus. SEEK MEDICAL CARE IF:   After the first few days, you feel you are getting worse rather than better.  You need your caregiver's advice about medicines to control symptoms.  You develop chills, worsening shortness of breath, or Pacey or red sputum. These may be signs of pneumonia.  You develop yellow or Rickett nasal discharge or pain in the face, especially when you bend forward. These may be signs of sinusitis.  You develop a fever, swollen neck glands, pain with swallowing, or white areas in the back of your throat. These may be signs of strep throat. SEEK IMMEDIATE MEDICAL CARE IF:   You have a fever.  You develop severe or persistent headache, ear pain, sinus pain, or chest pain.  You develop wheezing, a prolonged cough, cough up blood, or have a change in your usual mucus (if you have chronic lung disease).  You develop sore muscles or a stiff neck. Document Released: 04/15/2001 Document Revised: 01/12/2012 Document Reviewed: 02/21/2011 [facility] Patient Information 2014 [place], [place].

## 2014-02-09 NOTE — ED Provider Notes (Signed)
Medical screening examination/treatment/procedure(s) were performed by non-physician practitioner and as supervising physician I was immediately available for consultation/collaboration.   EKG Interpretation None       Kalman Drape, MD 02/09/14 [phone]

## 2014-02-21 ENCOUNTER — Encounter: Payer: Self-pay | Admitting: Internal Medicine

## 2014-02-21 ENCOUNTER — Ambulatory Visit: Payer: Self-pay | Attending: Internal Medicine | Admitting: Internal Medicine

## 2014-02-21 ENCOUNTER — Ambulatory Visit (HOSPITAL_COMMUNITY)
Admission: RE | Admit: 2014-02-21 | Discharge: 2014-02-21 | Disposition: A | Discharge status: Home / Self Care | Payer: Self-pay | Source: Ambulatory Visit | Attending: Internal Medicine | Admitting: Internal Medicine

## 2014-02-21 VITALS — BP 117/77 | HR 105 | Temp 98.9°F | Resp 14 | Ht 62.0 in | Wt 195.0 lb

## 2014-02-21 DIAGNOSIS — Z0000 Encounter for general adult medical examination without abnormal findings: Secondary | ICD-10-CM

## 2014-02-21 DIAGNOSIS — Z09 Encounter for follow-up examination after completed treatment for conditions other than malignant neoplasm: Secondary | ICD-10-CM | POA: Insufficient documentation

## 2014-02-21 DIAGNOSIS — J069 Acute upper respiratory infection, unspecified: Secondary | ICD-10-CM | POA: Insufficient documentation

## 2014-02-21 DIAGNOSIS — Z7901 Long term (current) use of anticoagulants: Secondary | ICD-10-CM | POA: Insufficient documentation

## 2014-02-21 DIAGNOSIS — Z124 Encounter for screening for malignant neoplasm of cervix: Secondary | ICD-10-CM | POA: Insufficient documentation

## 2014-02-21 DIAGNOSIS — K920 Hematemesis: Secondary | ICD-10-CM | POA: Insufficient documentation

## 2014-02-21 DIAGNOSIS — R042 Hemoptysis: Secondary | ICD-10-CM | POA: Insufficient documentation

## 2014-02-21 DIAGNOSIS — I82509 Chronic embolism and thrombosis of unspecified deep veins of unspecified lower extremity: Secondary | ICD-10-CM | POA: Insufficient documentation

## 2014-02-21 DIAGNOSIS — Z86711 Personal history of pulmonary embolism: Secondary | ICD-10-CM | POA: Insufficient documentation

## 2014-02-21 DIAGNOSIS — I2782 Chronic pulmonary embolism: Secondary | ICD-10-CM | POA: Insufficient documentation

## 2014-02-21 LAB — POCT RAPID STREP A (OFFICE): Rapid Strep A Screen: NEGATIVE

## 2014-02-21 MED ORDER — AMOXICILLIN-POT CLAVULANATE 875-125 MG PO TABS: 1.0000 | ORAL_TABLET | Freq: Two times a day (BID) | ORAL | Status: DC

## 2014-02-21 MED ORDER — GUAIFENESIN-CODEINE 100-10 MG/5ML PO SOLN: 5.0000 mL | ORAL | Status: DC | PRN

## 2014-02-21 NOTE — Progress Notes (Signed)
Patient ID: Tammy [name], female   DOB: 1956/09/03, 58 y.o.   MRN: [id number]   Tammy [name], is a 58 y.o. female  LNL:[id number]  EYC:[id number]  DOB - 06/08/1956  Chief Complaint  Patient presents with  . Follow-up        Subjective:   Tammy [name] is a 58 y.o. female here today for a follow up visit. 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 Cough is worse during the night, cough leads to stress incontinence of urine. She is seeing oncologist for the pulmonary embolism and DVT. No bleeding from any other orifice. She quit smoking in 2012, she does not drink alcohol. Patient has No headache, No chest pain, No abdominal pain - No Nausea, No new weakness tingling or numbness, No Cough - SOB.  Problem  URI (Upper Respiratory Infection)  Hemoptysis  Preventative Health Care    ALLERGIES: No Known Allergies  PAST MEDICAL HISTORY: Past Medical History  Diagnosis Date  . DVT (deep venous thrombosis)   . Pulmonary embolism   . Seasonal allergies   . Chronic headaches   . Leukemia     Told she had it at age 40, given some shots    MEDICATIONS AT HOME: Prior to Admission medications   Medication Sig Start Date End Date Taking? Authorizing Provider  chlorpheniramine-HYDROcodone (TUSSIONEX PENNKINETIC ER) 10-8 MG/5ML LQCR Take 5 mLs by mouth every 12 (twelve) hours. Take 5 mLs by mouth every 12 (twelve) hours. 02/09/14  Yes Peter S Dammen, PA-C  guaiFENesin (MUCINEX) 600 MG 12 hr tablet Take 1 tablet (600 mg total) by mouth 2 (two) times daily. 02/09/14  Yes Ruthell Rummage Dammen, PA-C  Rivaroxaban (XARELTO) 20 MG TABS tablet Take 1 tablet (20 mg total) by mouth daily. 08/22/13  Yes [name], MD  acetaminophen (TYLENOL) 500 MG tablet Take 1,000 mg by  mouth every 6 (six) hours as needed for pain.    Historical Provider, MD  amoxicillin-clavulanate (AUGMENTIN) 875-125 MG per tablet Take 1 tablet by mouth 2 (two) times daily. 02/21/14   Angelica Chessman, MD  cetirizine-pseudoephedrine (ZYRTEC-D) 5-120 MG per tablet Take 1 tablet by mouth 2 (two) times daily. 02/09/14   Ruthell Rummage Dammen, PA-C  guaiFENesin-codeine 100-10 MG/5ML syrup Take 5 mLs by mouth every 4 (four) hours as needed for cough. 02/21/14   Angelica Chessman, MD     Objective:   Filed Vitals:   02/21/14 0940  BP: 117/77  Pulse: 105  Temp: 98.9 F (37.2 C)  TempSrc: Oral  Resp: 14  Height: 5\' 2"  (1.575 m)  Weight: 195 lb (88.451 kg)  SpO2: 98%    Exam General appearance : Awake, alert, not in any distress. Speech Clear. Not toxic looking HEENT: Hyperemic uvula and tonsillar area, no bleeding site. Sputum examined, not bloodstained while in the clinic. Atraumatic and Normocephalic, pupils equally reactive to light and accomodation Neck: supple, no JVD. No cervical lymphadenopathy.  Chest:Good air entry bilaterally, no added sounds  CVS: S1 S2 regular, no murmurs.  Abdomen: Bowel sounds present, Non tender and not distended with no gaurding, rigidity or rebound. Extremities: B/L Lower Ext shows no edema, both legs are warm to touch Neurology: Awake alert, and oriented X 3, CN II-XII intact, Non focal Skin:No Rash Wounds:N/A  Data Review Lab Results  Component Value Date   HGBA1C  Value: 5.7 (NOTE)   The ADA recommends the following therapeutic goals for glycemic   control related to Hgb A1C measurement:   Goal of Therapy:   < 7.0% Hgb A1C   Action Suggested:  > 8.0% Hgb A1C   Ref:  Diabetes Care, 22, Suppl. 1, 1999 07/04/2007     Assessment & Plan   1. Preventative health care  - Rapid Strep A  2. Hemoptysis Chest x-ray I think this is mostly coming from the upper respiratory tract from repeated coughing. Patient has been educated on what to do if symptoms  worsen or does not improve with antibiotics  3. URI (upper respiratory infection)  - amoxicillin-clavulanate (AUGMENTIN) 875-125 MG per tablet; Take 1 tablet by mouth 2 (two) times daily.  Dispense: 20 tablet; Refill: 0 - guaiFENesin-codeine 100-10 MG/5ML syrup; Take 5 mLs by mouth every 4 (four) hours as needed for cough.  Dispense: 120 mL; Refill: 0 - DG Chest 2 View; Future  Return in about 4 weeks (around 03/21/2014), or if symptoms worsen or fail to improve, for Pulmonary Embolism and Anticoagulation.  The patient was given clear instructions to go to ER or return to medical center if symptoms don't improve, worsen or new problems develop. The patient verbalized understanding. The patient was told to call to get lab results if they haven't heard anything in the next week.   This note has been created with Surveyor, quantity. Any transcriptional errors are unintentional.    Angelica Chessman, MD, [place], [place], [place] [facility] [place], [place]   02/21/2014, 10:22 AM

## 2014-02-21 NOTE — Progress Notes (Signed)
For 3 weeks pt has been having prolonged coughing w/ greens and bloody mucus. Pt is also having off and on headaches.

## 2014-02-22 NOTE — Telephone Encounter (Signed)
Patient called office stating that her bp was elevated at 160/100 taking carvedilol 6.25mg  1 tab bid. Per dr. Oneida Alarhaithcock patient to take 2 tabs bid. Call on Friday with bp readings. If bp still elevated  Then call on Friday with bp and if still elevated she will start amlodipine.

## 2014-02-24 MED ORDER — CARVEDILOL 12.5 MG TAB
12.5 mg | ORAL_TABLET | Freq: Two times a day (BID) | ORAL | Status: DC
Start: 2014-02-24 — End: 2014-09-06

## 2014-02-24 MED ORDER — AMLODIPINE 5 MG TAB
5 mg | ORAL_TABLET | Freq: Every day | ORAL | Status: DC
Start: 2014-02-24 — End: 2014-02-24

## 2014-02-24 MED ORDER — HYDROCHLOROTHIAZIDE 12.5 MG CAP
12.5 mg | ORAL_CAPSULE | Freq: Every day | ORAL | Status: DC
Start: 2014-02-24 — End: 2014-06-12

## 2014-02-24 MED ORDER — NIFEDIPINE ER 30 MG TAB
30 mg | ORAL_TABLET | Freq: Every day | ORAL | Status: DC
Start: 2014-02-24 — End: 2014-04-27

## 2014-02-24 NOTE — Progress Notes (Signed)
[facility]  Carol Adaoderick Kasidy Gianino, MD, [place]ACP, CMD  [address]  [address]    [place] [id number]  Phone:  [phone]  Fax: [phone]       Chief Complaint   Patient presents with   ??? Hypertension      elevated   .      SUBJECTIVE:    Vanessa [name] is a 58 y.o. female Patient returns today for follow-up with a known history of primary hypertension, depression, hepatic steatosis, negative stress test with ejection fraction of 56% 02/10/12, and is seen for evaluation.  She is concerned about her blood pressure and brings me her blood pressure readings which are ranging from 131/81 to 172/106.  She had shot in her knee because of trouble walking.  She saw Billey CoWilliam Fleming, MD.  She continues to have pressure on her head as well as sharp pains in her right arm.  She has sharp pains everywhere.  She is seeing a psychologist that works with Julianne Riceashida Gray, MD.  Patient is seen for evaluation.            Current Outpatient Prescriptions   Medication Sig Dispense Refill   ??? carvedilol (COREG) 12.5 mg tablet Take 1 Tab by mouth two (2) times daily (with meals).  60 Tab  11   ??? hydrochlorothiazide (MICROZIDE) 12.5 mg capsule Take 1 Cap by mouth daily.  30 Cap  11   ??? NIFEdipine ER (ADALAT CC) 30 mg ER tablet Take 1 Tab by mouth daily.  90 Tab  11   ??? vit B Cmplx 3-FA-Vit C-Biotin (NEPHRO VITE RX) 1-60-300 mg-mg-mcg tablet Take 1 Tab by mouth daily.       ??? busPIRone (BUSPAR) 7.5 mg tablet Take 1 Tab by mouth two (2) times a day.  60 Tab  1   ??? CALCIUM-MAGNESIUM-VITAMIN D2 PO Take  by mouth.       ??? DULoxetine (CYMBALTA) 30 mg capsule Take 1 Cap by mouth two (2) times a day.  60 Cap  1   ??? Cholecalciferol, Vitamin D3, (VITAMIN D3) 2,000 unit cap capsule Take 2,000 Units by mouth daily.  30 Cap  11   ??? therapeutic multivitamin (THERAGRAN) tablet Take 1 Tab by mouth daily.         Past Medical History   Diagnosis Date   ??? Hypertension    ??? Normal cardiac stress test 02-10-12   ??? Hepatic steatosis    ??? Depression     ??? H/O colonoscopy 09-24-12     diverticulosis     Past Surgical History   Procedure Laterality Date   ??? Hx gyn       tubal ligation     Allergies   Allergen Reactions   ??? Amlodipine Other (comments)     Loss of memory,nervous,jettery   ??? Losartan Other (comments)     Nervous,jettery         REVIEW OF SYSTEMS:  General: negative for - chills or fever  ENT: negative for - headaches, nasal congestion or tinnitus  Respiratory: negative for - cough, hemoptysis, shortness of breath or wheezing  Cardiovascular : negative for - chest pain, edema, palpitations or shortness of breath  Gastrointestinal: negative for - abdominal pain, blood in stools, heartburn or nausea/vomiting  Genito-Urinary: no dysuria, trouble voiding, or hematuria  Musculoskeletal: negative for - gait disturbance, joint pain, joint stiffness or joint swelling  Neurological: no TIA or stroke symptoms  Hematologic: no bruises, no bleeding, no  swollen glands  Integument: no lumps, mole changes, nail changes or rash  Endocrine: no malaise/lethargy or unexpected weight changes      History     Social History   ??? Marital Status: MARRIED     Spouse Name: N/A     Number of Children: N/A   ??? Years of Education: N/A     Social History Main Topics   ??? Smoking status: Never Smoker    ??? Smokeless tobacco: Never Used   ??? Alcohol Use: No   ??? Drug Use: No   ??? Sexual Activity:     Partners: Male     Pharmacist, hospital Protection: None     Other Topics Concern   ??? Not on file     Social History Narrative     Family History   Problem Relation Age of Onset   ??? Heart Disease Mother    ??? Heart Disease Father        OBJECTIVE:    BP 166/93   Pulse 68   Temp(Src) 97 ??F (36.1 ??C) (Oral)   Resp 22   Ht 5\' 7"  (1.702 m)   Wt 166 lb (75.297 kg)   BMI 25.99 kg/m2   SpO2 98%  CONSTITUTIONAL: well , well nourished, appears age appropriate  EYES: perrla, eom intact  ENMT:moist mucous membranes, pharynx clear  NECK: supple. Thyroid normal  RESPIRATORY: Chest: clear to ascultation and  percussion   CARDIOVASCULAR: Heart: regular rate and rhythm  GASTROINTESTINAL: Abdomen: soft, bowel sounds active  HEMATOLOGIC: no pathological lymph nodes palpated  MUSCULOSKELETAL: Extremities: no edema, pulse 1+   INTEGUMENT: No unusual rashes or suspicious skin lesions noted. Nails appear normal.  NEUROLOGIC: non-focal exam   MENTAL STATUS: alert and oriented, appropriate affect      ASSESSMENT:  1. Essential hypertension    2. Hepatic steatosis    3. Anxiety and depression      Blood pressure control is inadequate.  She has tried Amlodipine and had problems with jitteriness and confusion as well as memory loss.  She had a similar problem with Losartan and therefore we will not use either drug.  We will try Nifedipine and see if we can make her comfortable.  We will start off at a low dose and gradually increase the dose until we get her blood pressure in a therapeutic range.  We remind her that she may have to take between three and five different blood pressure medications to control her blood pressure.  We advise her not to stop the other two medications and to continue the Hydrochlorothiazide, the Carvedilol and now we are adding Nifedipine.  She will return to the office in two weeks for a blood pressure check.        PLAN:  .  Orders Placed This Encounter   ??? DISCONTD: hydrochlorothiazide (MICROZIDE) 12.5 mg capsule   ??? carvedilol (COREG) 12.5 mg tablet   ??? DISCONTD: amLODIPine (NORVASC) 5 mg tablet   ??? hydrochlorothiazide (MICROZIDE) 12.5 mg capsule   ??? NIFEdipine ER (ADALAT CC) 30 mg ER tablet       Follow-up Disposition:  Return in about 2 weeks (around 03/10/2014) for bp check.      ATTENTION:   This medical record was transcribed using an electronic medical records system.  Although proofread, it may and can contain electronic and spelling errors.  Other human spelling and other errors may be present.  Corrections may be executed at a later time.  Please feel free  to contact us for any clarifications as  needed.

## 2014-02-24 NOTE — Progress Notes (Signed)
.  1. Have you been to the ER, urgent care clinic since your last visit?  Hospitalized since your last visit?No    2. Have you seen or consulted any other health care providers outside of the [facility] since your last visit?  Include any pap smears or colon screening. No

## 2014-02-24 NOTE — Telephone Encounter (Signed)
Patient phone office today stating her blood pressure remains elevated even after increasing Coreg 6.25 to 2 tabs BID as instructed on Wednesday, she also states she is experiencing blurred vision, per Dr. Oneida AlarHaithcock patient instructed to return to office today.

## 2014-02-28 MED ORDER — BUSPIRONE 15 MG TAB
15 mg | ORAL_TABLET | Freq: Two times a day (BID) | ORAL | Status: DC
Start: 2014-02-28 — End: 2014-03-29

## 2014-02-28 MED ORDER — DULOXETINE 30 MG CAP, DELAYED RELEASE
30 mg | ORAL_CAPSULE | ORAL | Status: DC
Start: 2014-02-28 — End: 2014-03-29
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
## 2014-02-28 NOTE — Patient Instructions (Signed)
1  May take Melatonin for sleep

## 2014-03-07 ENCOUNTER — Ambulatory Visit: Payer: Self-pay | Attending: Internal Medicine

## 2014-03-09 ENCOUNTER — Telehealth: Payer: Self-pay | Admitting: Emergency Medicine

## 2014-03-09 NOTE — Telephone Encounter (Signed)
Message copied by Ricci Barker on Thu Mar 09, 2014  5:54 PM ------      Message from: Angelica Chessman E      Created: Wed Mar 08, 2014  4:02 PM       Please inform patient that her chest x-ray shows areas of scarring but no acute disease. ------

## 2014-03-09 NOTE — Telephone Encounter (Signed)
Left message for pt to return call.

## 2014-03-17 ENCOUNTER — Telehealth: Payer: Self-pay

## 2014-03-17 NOTE — Telephone Encounter (Signed)
Placed call to patient again to inform her of Chest X-Ray results. No answer, left voicemail requesting return call.

## 2014-03-21 ENCOUNTER — Ambulatory Visit: Payer: Self-pay | Admitting: Internal Medicine

## 2014-03-29 MED ORDER — ATOMOXETINE 40 MG CAP
40 mg | ORAL_CAPSULE | Freq: Every day | ORAL | Status: DC
Start: 2014-03-29 — End: 2014-04-27

## 2014-03-29 MED ORDER — BUSPIRONE 30 MG TAB
30 mg | ORAL_TABLET | Freq: Two times a day (BID) | ORAL | Status: DC
Start: 2014-03-29 — End: 2014-04-27

## 2014-03-29 MED ORDER — DULOXETINE 30 MG CAP, DELAYED RELEASE
30 mg | ORAL_CAPSULE | ORAL | Status: DC
Start: 2014-03-29 — End: 2014-05-04
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
## 2014-04-11 ENCOUNTER — Other Ambulatory Visit: Payer: Self-pay | Admitting: Hematology and Oncology

## 2014-04-27 ENCOUNTER — Encounter

## 2014-04-27 NOTE — Progress Notes (Addendum)
[facility]  Carol Adaoderick Ettel Albergo, MD, LoganFACP, CMD  [address]  [address]   [place] [id number]  Phone:  [phone]  Fax: [phone]       Chief Complaint   Patient presents with   ??? Follow-up   .      SUBJECTIVE:    Vanessa [name] is a 58 y.o. female The patient returns today with known history of primary hypertension, hepatic steatosis, and depression.  She has had a stress Myoview 02/10/12 that revealed no ischemia, ejection fraction of 56%.  Cytologist had sent us a letter, Chyrl CivatteJoAnn A. Roddie Mchomas-Wilson, PhD with [facility], fax [phone].  She asked us to order whatever testing is needed to address any possible drug interactions in her present and past four months of medication regimens that may be affecting her cognitive function (memory, alertness, analytical thinking etc, and her ability to sleep at night, forgetfulness, depends on coffee to function better, and relief of feeling of a tight band around her head, to address her renal function, adrenal gland function, and any other systemic issues as deemed necessary.  Dr. Andrey CampanileWilson noted that she has been experiencing several menopausal symptoms, constant heat-she is always hot and perspiring.  The hotter it gets the less she can think, irritability.  It also gets worse the hotter it gets, and tingling in her legs and arms.  She has been consistently experiencing these symptoms for the past three years, although she has not had a cycle in almost eight years.  She wants to return to work, however, the cognitive dysfunction severely impacts the execution of her work tasks.      My recollection of her stress is related to family issues after her mom passed.  However, we took care of her mom for years.  Unfortunately the family did not come together after the departing of her mom.  She and her sisters continue to be at odds, and the estate issues have not been  resolved, and continues to have stressors at work at [employer] where she works in [employer].  The stress is related to her immediate supervisor, who is not an honest person with regards to interpersonal relationships, which is a continuous stress for her.  She is actually not looking forward to going back to work primarily because of that because the job it self she enjoys.  The patient continues to have intermarital stressors.   They are still married, but in different bedrooms.  One child lives at home.  She is 21.  Her son is married and living with his wife, and has a successful marriage with three children.  Her [place] in [place] and homeless.  Unfortunately the patient's husband will not go to counseling with Dr. Andrey CampanileWilson.  The patient is a member of [employer].  The patient is concerned about the stress of her adrenal gland, and wonders about the function of her adrenal gland.  She also wonders about the vitamin levels.  She found out that her blood type was AB positive.            Current Outpatient Prescriptions   Medication Sig Dispense Refill   ??? busPIRone (BUSPAR) 15 mg tablet      ??? DULoxetine (CYMBALTA) 30 mg capsule Take one in the morning and two in the evening 90 Cap 1   ??? carvedilol (COREG) 12.5 mg tablet Take 1 Tab by mouth two (2) times  daily (with meals). 60 Tab 11   ??? hydrochlorothiazide (MICROZIDE) 12.5 mg capsule Take 1 Cap by mouth daily. 30 Cap 11   ??? vit B Cmplx 3-FA-Vit C-Biotin (NEPHRO VITE RX) 1-60-300 mg-mg-mcg tablet Take 1 Tab by mouth daily.     ??? CALCIUM-MAGNESIUM-VITAMIN D2 PO Take  by mouth.     ??? Cholecalciferol, Vitamin D3, (VITAMIN D3) 2,000 unit cap capsule Take 2,000 Units by mouth daily. 30 Cap 11   ??? therapeutic multivitamin (THERAGRAN) tablet Take 1 Tab by mouth daily.       Past Medical History   Diagnosis Date   ??? Hypertension    ??? Normal cardiac stress test 02-10-12   ??? Hepatic steatosis    ??? Depression     ??? H/O colonoscopy 09-24-12     diverticulosis     Past Surgical History   Procedure Laterality Date   ??? Hx gyn       tubal ligation     Allergies   Allergen Reactions   ??? Amlodipine Other (comments)     Loss of memory,nervous,jettery   ??? Losartan Other (comments)     Nervous,jettery         REVIEW OF SYSTEMS:  General: negative for - chills or fever  ENT: negative for - headaches, nasal congestion or tinnitus  Respiratory: negative for - cough, hemoptysis, shortness of breath or wheezing  Cardiovascular : negative for - chest pain, edema, palpitations or shortness of breath  Gastrointestinal: negative for - abdominal pain, blood in stools, heartburn or nausea/vomiting  Genito-Urinary: no dysuria, trouble voiding, or hematuria  Musculoskeletal: negative for - gait disturbance, joint pain, joint stiffness or joint swelling  Neurological: no TIA or stroke symptoms  Hematologic: no bruises, no bleeding, no swollen glands  Integument: no lumps, mole changes, nail changes or rash  Endocrine: no malaise/lethargy or unexpected weight changes      History     Social History   ??? Marital Status: MARRIED     Spouse Name: N/A     Number of Children: N/A   ??? Years of Education: N/A     Social History Main Topics   ??? Smoking status: Never Smoker    ??? Smokeless tobacco: Never Used   ??? Alcohol Use: No   ??? Drug Use: No   ??? Sexual Activity:     Partners: Male     Pharmacist, hospital Protection: None     Other Topics Concern   ??? Not on file     Social History Narrative     Family History   Problem Relation Age of Onset   ??? Heart Disease Mother    ??? Heart Disease Father        OBJECTIVE:    BP 161/90 mmHg   Pulse 66   Temp(Src) 96.4 ??F (35.8 ??C) (Oral)   Resp 15   Ht 5\' 7"  (1.702 m)   Wt 157 lb (71.215 kg)   BMI 24.58 kg/m2   SpO2 100%  CONSTITUTIONAL: well , well nourished, appears age appropriate  EYES: perrla, eom intact  ENMT:moist mucous membranes, pharynx clear  NECK: supple. Thyroid normal   RESPIRATORY: Chest: clear to ascultation and percussion   CARDIOVASCULAR: Heart: regular rate and rhythm  GASTROINTESTINAL: Abdomen: soft, bowel sounds active  HEMATOLOGIC: no pathological lymph nodes palpated  MUSCULOSKELETAL: Extremities: no edema, pulse 1+   INTEGUMENT: No unusual rashes or suspicious skin lesions noted. Nails appear normal.  NEUROLOGIC: non-focal exam   MENTAL STATUS:  alert and oriented, appropriate affect      ASSESSMENT:  1. Essential hypertension    2. Headache    3. Bilateral low back pain without sciatica    4. Hepatic steatosis    5. Anxiety and depression      We reviewed his blood pressures, noting that the blood pressure today is elevated, compatible with white coat hypertension. Blood pressure readings fluctuates from 116/75 to 146/93 with most of them in the normotensive range.  I do not think blood pressure is an issue here.  Reviewed her medications for drug interactions.  As noted we reviewed her medications with her and discontinued those that she is not taking, and note that she remains under the care of Mellody Danceara Crisinati, N.P., for her psychiatrist, Dr. Wallace CullensGray.  Appropriate lab studies have been requested.  Will report to her, as well as psychologist the results.  The patient will return to see us in about a month.    In summary, we have reviewed the patient's records and note that she has been evaluated in the past from a cardiac perspective with stress tests that have been negative.  From a neurological perspective she has had an MRI with and without contrast of the brain. There were a few small foci of high density in the deep white matter bilaterally, which were nonspecific, but is felt to be within normal limits for her age, she has had a vitamin D level that was borderline low at 24.4, and she has been on vitamin D replacement.  She was evaluated for hepatitis, which was negative, as well as connective tissue diseases, which were also negative on evaluation.   She has been seen by behavioral health while hospitalized in 2013 for major depressive disorder, moderate to severe, generalized anxiety disorder, and panic disorder without agoraphobia by Dr. Wallace CullensGray.  She has been seen in consultation urology in the past with an unremarkable evaluation, and we will evaluate her serologically for the request by the psychologist, but I think her issues are not on an organic basis.          PLAN:  .  Orders Placed This Encounter   ??? URINALYSIS W/ RFLX MICROSCOPIC   ??? HEMOGLOBIN A1C   ??? CBC WITH AUTOMATED DIFF   ??? METABOLIC PANEL, COMPREHENSIVE   ??? LIPID PANEL   ??? TSH, 3RD GENERATION   ??? CORTISOL   ??? VITAMIN B12   ??? VITAMIN D, 25 HYDROXY   ??? busPIRone (BUSPAR) 15 mg tablet   ??? DISCONTD: carvedilol (COREG) 6.25 mg tablet   ??? DISCONTD: busPIRone (BUSPAR) 7.5 mg tablet   ??? DISCONTD: fluconazole (DIFLUCAN) 150 mg tablet   ??? DISCONTD: naproxen (NAPROSYN) 500 mg tablet   ??? DISCONTD: nitrofurantoin, macrocrystal-monohydrate, (MACROBID) 100 mg capsule       Follow-up Disposition:  Return in about 4 weeks (around 05/25/2014).      ATTENTION:   This medical record was transcribed using an electronic medical records system.  Although proofread, it may and can contain electronic and spelling errors.  Other human spelling and other errors may be present.  Corrections may be executed at a later time.  Please feel free to contact us for any clarifications as needed.

## 2014-04-27 NOTE — Progress Notes (Signed)
.  1. Have you been to the ER, urgent care clinic since your last visit?  Hospitalized since your last visit?No    2. Have you seen or consulted any other health care providers outside of the [facility] since your last visit?  Include any pap smears or colon screening. No

## 2014-04-28 LAB — CBC WITH AUTOMATED DIFF
ABS. BASOPHILS: 0.1 10*3/uL (ref 0.0–0.2)
ABS. EOSINOPHILS: 0.1 10*3/uL (ref 0.0–0.4)
ABS. IMM. GRANS.: 0 10*3/uL (ref 0.0–0.1)
ABS. MONOCYTES: 0.3 10*3/uL (ref 0.1–0.9)
ABS. NEUTROPHILS: 3.1 10*3/uL (ref 1.4–7.0)
Abs Lymphocytes: 2.6 10*3/uL (ref 0.7–3.1)
BASOPHILS: 1 %
EOSINOPHILS: 2 %
HCT: 41.8 % (ref 34.0–46.6)
HGB: 14.1 g/dL (ref 11.1–15.9)
IMMATURE GRANULOCYTES: 0 %
Lymphocytes: 42 %
MCH: 31.4 pg (ref 26.6–33.0)
MCHC: 33.7 g/dL (ref 31.5–35.7)
MCV: 93 fL (ref 79–97)
MONOCYTES: 5 %
NEUTROPHILS: 50 %
PLATELET: 366 10*3/uL (ref 150–379)
RBC: 4.49 x10E6/uL (ref 3.77–5.28)
RDW: 14.5 % (ref 12.3–15.4)
WBC: 6.3 10*3/uL (ref 3.4–10.8)

## 2014-04-28 LAB — URINALYSIS W/ RFLX MICROSCOPIC
Bilirubin: NEGATIVE
Blood: NEGATIVE
Glucose: NEGATIVE
Ketone: NEGATIVE
Nitrites: NEGATIVE
Protein: NEGATIVE
Specific Gravity: 1.016 (ref 1.005–1.030)
Urobilinogen: 0.2 mg/dL (ref 0.0–1.9)
pH (UA): 7.5 (ref 5.0–7.5)

## 2014-04-28 LAB — METABOLIC PANEL, COMPREHENSIVE
A-G Ratio: 1.8 (ref 1.1–2.5)
ALT (SGPT): 19 IU/L (ref 0–32)
AST (SGOT): 20 IU/L (ref 0–40)
Albumin: 4.5 g/dL (ref 3.5–5.5)
Alk. phosphatase: 78 IU/L (ref 39–117)
BUN/Creatinine ratio: 12 (ref 9–23)
BUN: 13 mg/dL (ref 6–24)
Bilirubin, total: <0.2 mg/dL (ref 0.0–1.2)
CO2: 24 mmol/L (ref 18–29)
Calcium: 10 mg/dL (ref 8.7–10.2)
Chloride: 100 mmol/L (ref 97–108)
Creatinine: 1.09 mg/dL — ABNORMAL HIGH (ref 0.57–1.00)
GFR est AA: 66 mL/min/{1.73_m2} (ref >59)
GFR est non-AA: 57 mL/min/{1.73_m2} — ABNORMAL LOW (ref >59)
GLOBULIN, TOTAL: 2.5 g/dL (ref 1.5–4.5)
Glucose: 86 mg/dL (ref 65–99)
Potassium: 3.9 mmol/L (ref 3.5–5.2)
Protein, total: 7 g/dL (ref 6.0–8.5)
Sodium: 141 mmol/L (ref 134–144)

## 2014-04-28 LAB — LIPID PANEL
Cholesterol, total: 223 mg/dL — ABNORMAL HIGH (ref 100–199)
HDL Cholesterol: 62 mg/dL (ref >39)
LDL, calculated: 135 mg/dL — ABNORMAL HIGH (ref 0–99)
Triglyceride: 129 mg/dL (ref 0–149)
VLDL, calculated: 26 mg/dL (ref 5–40)

## 2014-04-28 LAB — MICROSCOPIC EXAMINATION: Epithelial cells: >10 /hpf — AB (ref 0–10)

## 2014-04-28 LAB — CORTISOL: Cortisol, random: 3.9 ug/dL (ref 2.3–19.4)

## 2014-04-28 LAB — VITAMIN D, 25 HYDROXY: VITAMIN D, 25-HYDROXY: 30.3 ng/mL (ref 30.0–100.0)

## 2014-04-28 LAB — TSH 3RD GENERATION: TSH: 1.36 u[IU]/mL (ref 0.450–4.500)

## 2014-04-28 LAB — HEMOGLOBIN A1C WITH EAG: Hemoglobin A1c: 5.5 % (ref 4.8–5.6)

## 2014-04-28 LAB — VITAMIN B12: Vitamin B12: 1013 pg/mL — ABNORMAL HIGH (ref 211–946)

## 2014-05-04 MED ORDER — DULOXETINE 30 MG CAP, DELAYED RELEASE
30 mg | ORAL_CAPSULE | Freq: Two times a day (BID) | ORAL | Status: DC
Start: 2014-05-04 — End: 2014-08-11

## 2014-05-04 MED ORDER — BUSPIRONE 30 MG TAB
30 mg | ORAL_TABLET | Freq: Two times a day (BID) | ORAL | Status: DC
Start: 2014-05-04 — End: 2016-10-30
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
## 2014-05-16 NOTE — Telephone Encounter (Signed)
Patient states she increased her buspar dosage on Friday. She reports the increase makes her dizzy and feel "high." She is at work today, but is struggling to focus and pay attention. She states she doesn't know how she will go back to full duty tomorrow with this kind of reaction. Please advise.

## 2014-05-18 NOTE — Telephone Encounter (Signed)
msg left

## 2014-05-25 ENCOUNTER — Ambulatory Visit: Payer: Self-pay | Attending: Internal Medicine | Admitting: Internal Medicine

## 2014-05-25 ENCOUNTER — Encounter: Payer: Self-pay | Admitting: Internal Medicine

## 2014-05-25 VITALS — BP 131/86 | HR 84 | Temp 98.5°F | Resp 16 | Ht 62.0 in | Wt 193.0 lb

## 2014-05-25 DIAGNOSIS — Z87891 Personal history of nicotine dependence: Secondary | ICD-10-CM | POA: Insufficient documentation

## 2014-05-25 DIAGNOSIS — Z0000 Encounter for general adult medical examination without abnormal findings: Secondary | ICD-10-CM

## 2014-05-25 DIAGNOSIS — Z8673 Personal history of transient ischemic attack (TIA), and cerebral infarction without residual deficits: Secondary | ICD-10-CM | POA: Insufficient documentation

## 2014-05-25 DIAGNOSIS — M25551 Pain in right hip: Secondary | ICD-10-CM

## 2014-05-25 DIAGNOSIS — Z7901 Long term (current) use of anticoagulants: Secondary | ICD-10-CM | POA: Insufficient documentation

## 2014-05-25 DIAGNOSIS — J301 Allergic rhinitis due to pollen: Secondary | ICD-10-CM | POA: Insufficient documentation

## 2014-05-25 DIAGNOSIS — Z86711 Personal history of pulmonary embolism: Secondary | ICD-10-CM | POA: Insufficient documentation

## 2014-05-25 DIAGNOSIS — M79609 Pain in unspecified limb: Secondary | ICD-10-CM | POA: Insufficient documentation

## 2014-05-25 DIAGNOSIS — R209 Unspecified disturbances of skin sensation: Secondary | ICD-10-CM | POA: Insufficient documentation

## 2014-05-25 DIAGNOSIS — M7989 Other specified soft tissue disorders: Secondary | ICD-10-CM | POA: Insufficient documentation

## 2014-05-25 DIAGNOSIS — M25559 Pain in unspecified hip: Secondary | ICD-10-CM

## 2014-05-25 DIAGNOSIS — I82409 Acute embolism and thrombosis of unspecified deep veins of unspecified lower extremity: Secondary | ICD-10-CM

## 2014-05-25 LAB — CBC WITH DIFFERENTIAL/PLATELET
BASOS PCT: 0 % (ref 0–1)
Basophils Absolute: 0 10*3/uL (ref 0.0–0.1)
Eosinophils Absolute: 0.2 10*3/uL (ref 0.0–0.7)
Eosinophils Relative: 3 % (ref 0–5)
HCT: 39.3 % (ref 36.0–46.0)
HEMOGLOBIN: 13.1 g/dL (ref 12.0–15.0)
LYMPHS ABS: 2.6 10*3/uL (ref 0.7–4.0)
Lymphocytes Relative: 36 % (ref 12–46)
MCH: 29.6 pg (ref 26.0–34.0)
MCHC: 33.3 g/dL (ref 30.0–36.0)
MCV: 88.9 fL (ref 78.0–100.0)
MONOS PCT: 10 % (ref 3–12)
Monocytes Absolute: 0.7 10*3/uL (ref 0.1–1.0)
NEUTROS ABS: 3.6 10*3/uL (ref 1.7–7.7)
Neutrophils Relative %: 51 % (ref 43–77)
Platelets: 341 10*3/uL (ref 150–400)
RBC: 4.42 MIL/uL (ref 3.87–5.11)
RDW: 13.1 % (ref 11.5–15.5)
WBC: 7.1 10*3/uL (ref 4.0–10.5)

## 2014-05-25 LAB — LIPID PANEL
CHOL/HDL RATIO: 6.2 ratio
CHOLESTEROL: 281 mg/dL — AB (ref 0–200)
HDL: 45 mg/dL (ref >39)
LDL Cholesterol: 172 mg/dL — ABNORMAL HIGH (ref 0–99)
Triglycerides: 322 mg/dL — ABNORMAL HIGH (ref <150)
VLDL: 64 mg/dL — ABNORMAL HIGH (ref 0–40)

## 2014-05-25 LAB — COMPLETE METABOLIC PANEL WITH GFR
ALBUMIN: 4 g/dL (ref 3.5–5.2)
ALT: 17 U/L (ref 0–35)
AST: 17 U/L (ref 0–37)
Alkaline Phosphatase: 128 U/L — ABNORMAL HIGH (ref 39–117)
BUN: 10 mg/dL (ref 6–23)
CO2: 29 meq/L (ref 19–32)
Calcium: 9.6 mg/dL (ref 8.4–10.5)
Chloride: 104 mEq/L (ref 96–112)
Creat: 0.94 mg/dL (ref 0.50–1.10)
GFR, EST AFRICAN AMERICAN: 78 mL/min
GFR, EST NON AFRICAN AMERICAN: 68 mL/min
GLUCOSE: 81 mg/dL (ref 70–99)
POTASSIUM: 4.5 meq/L (ref 3.5–5.3)
Sodium: 140 mEq/L (ref 135–145)
Total Bilirubin: 0.9 mg/dL (ref 0.2–1.2)
Total Protein: 7.2 g/dL (ref 6.0–8.3)

## 2014-05-25 NOTE — Progress Notes (Signed)
Patient ID: Tammy [name], female   DOB: August 07, 1956, 58 y.o.   MRN: [id number]   Tammy [name], is a 58 y.o. female  CNO:[id number]  QHU:[id number]  DOB - [birth year]  Chief Complaint  Patient presents with  . Follow-up        Subjective:   Tammy [name] is a 59 y.o. female here today for a follow up visit. 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 She is here today for routine follow up. She complains of left lower limb pain and occasional swelling in the past few days with tingling of the left foot and toes. She denies chest pain or shortness of breath. She has no bleeding from any orifice. No joint swelling. Patient is eating and drinking well without complaints. She is a former smoker quit in 2012 (16-pack-year). She does not drink alcohol. Patient has No headache, No chest pain, No abdominal pain - No Nausea, No new weakness tingling or numbness, No Cough - SOB.  No problems updated.  ALLERGIES: No Known Allergies  PAST MEDICAL HISTORY: Past Medical History  Diagnosis Date  . DVT (deep venous thrombosis)   . Pulmonary embolism   . Seasonal allergies   . Chronic headaches   . Leukemia     Told she had it at age 64, given some shots    MEDICATIONS AT HOME: Prior to Admission medications   Medication Sig Start Date End Date Taking? Authorizing Provider  acetaminophen (TYLENOL) 500 MG tablet Take 1,000 mg by mouth every 6 (six) hours as needed for pain.   Yes Historical Provider, MD  XARELTO 20 MG TABS tablet TAKE 1 TABLET BY MOUTH ONCE DAILY 04/11/14  Yes [name], MD  amoxicillin-clavulanate (AUGMENTIN) 875-125 MG per tablet Take 1 tablet by mouth 2 (two) times daily. 02/21/14   Angelica Chessman, MD  cetirizine-pseudoephedrine (ZYRTEC-D) 5-120 MG per tablet Take 1 tablet by mouth 2 (two) times daily. 02/09/14   Ruthell Rummage Dammen, PA-C  chlorpheniramine-HYDROcodone (TUSSIONEX PENNKINETIC ER) 10-8 MG/5ML LQCR Take 5 mLs by mouth every 12 (twelve) hours. Take 5  mLs by mouth every 12 (twelve) hours. 02/09/14   Ruthell Rummage Dammen, PA-C  guaiFENesin (MUCINEX) 600 MG 12 hr tablet Take 1 tablet (600 mg total) by mouth 2 (two) times daily. 02/09/14   Ruthell Rummage Dammen, PA-C  guaiFENesin-codeine 100-10 MG/5ML syrup Take 5 mLs by mouth every 4 (four) hours as needed for cough. 02/21/14   Angelica Chessman, MD     Objective:   Filed Vitals:   05/25/14 1423  BP: 131/86  Pulse: 84  Temp: 98.5 F (36.9 C)  TempSrc: Oral  Resp: 16  Height: 5\' 2"  (1.575 m)  Weight: 193 lb (87.544 kg)  SpO2: 97%    Exam General appearance : Awake, alert, not in any distress. Speech Clear. Not toxic looking HEENT: Atraumatic and Normocephalic, pupils equally reactive to light and accomodation Neck: supple, no JVD. No cervical lymphadenopathy.  Chest:Good air entry bilaterally, no added sounds  CVS: S1 S2 regular, no murmurs.  Abdomen: Bowel sounds present, Non tender and not distended with no gaurding, rigidity or rebound. Extremities: B/L Lower Ext shows no edema, both legs are warm to touch, mild tenderness on the left calf Neurology: Awake alert, and oriented X 3, CN II-XII intact, Non focal Skin:No Rash Wounds:N/A  Data Review Lab Results  Component Value Date   HGBA1C  Value: 5.7 (NOTE)   The ADA recommends the following therapeutic goals for glycemic  control related to Hgb A1C measurement:   Goal of Therapy:   < 7.0% Hgb A1C   Action Suggested:  > 8.0% Hgb A1C   Ref:  Diabetes Care, 22, Suppl. 1, 1999 07/04/2007     Assessment & Plan   1. DVT (deep venous thrombosis), unspecified laterality  - Lower Extremity Venous Duplex Left; Future  2. Preventative health care  - CBC with Differential - COMPLETE METABOLIC PANEL WITH GFR - POCT glycosylated hemoglobin (Hb A1C) - Lipid panel - TSH - Vit D  25 hydroxy (rtn osteoporosis monitoring)  Patient was extensively counseled on nutrition and exercise She was encouraged to be compliant with  anticoagulation   Return in about 6 months (around 11/25/2014), or if symptoms worsen or fail to improve, for Pulmonary Embolism and Anticoagulation.  The patient was given clear instructions to go to ER or return to medical center if symptoms don't improve, worsen or new problems develop. The patient verbalized understanding. The patient was told to call to get lab results if they haven't heard anything in the next week.   This note has been created with Surveyor, quantity. Any transcriptional errors are unintentional.    Angelica Chessman, MD, [place], [place], [place] and [place], [place]   05/25/2014, 3:13 PM

## 2014-05-25 NOTE — Patient Instructions (Signed)
Exercise to Lose Weight Exercise and a healthy diet may help you lose weight. Your doctor may suggest specific exercises. EXERCISE IDEAS AND TIPS  Choose low-cost things you enjoy doing, such as walking, bicycling, or exercising to workout videos.  Take stairs instead of the elevator.  Walk during your lunch break.  Park your car further away from work or school.  Go to a gym or an exercise class.  Start with 5 to 10 minutes of exercise each day. Build up to 30 minutes of exercise 4 to 6 days a week.  Wear shoes with good support and comfortable clothes.  Stretch before and after working out.  Work out until you breathe harder and your heart beats faster.  Drink extra water when you exercise.  Do not do so much that you hurt yourself, feel dizzy, or get very short of breath. Exercises that burn about 150 calories:  Running 1  miles in 15 minutes.  Playing volleyball for 45 to 60 minutes.  Washing and waxing a car for 45 to 60 minutes.  Playing touch football for 45 minutes.  Walking 1  miles in 35 minutes.  Pushing a stroller 1  miles in 30 minutes.  Playing basketball for 30 minutes.  Raking leaves for 30 minutes.  Bicycling 5 miles in 30 minutes.  Walking 2 miles in 30 minutes.  Dancing for 30 minutes.  Shoveling snow for 15 minutes.  Swimming laps for 20 minutes.  Walking up stairs for 15 minutes.  Bicycling 4 miles in 15 minutes.  Gardening for 30 to 45 minutes.  Jumping rope for 15 minutes.  Washing windows or floors for 45 to 60 minutes. Document Released: 11/22/2010 Document Revised: 01/12/2012 Document Reviewed: 11/22/2010 ExitCare Patient Information 2015 [employer]. This information is not intended to replace advice given to you by your health care provider. Make sure you discuss any questions you have with your health care provider. Calorie Counting for Weight Loss Calories are energy you get from the things you eat and drink. Your  body uses this energy to keep you going throughout the day. The number of calories you eat affects your weight. When you eat more calories than your body needs, your body stores the extra calories as fat. When you eat fewer calories than your body needs, your body burns fat to get the energy it needs. Calorie counting means keeping track of how many calories you eat and drink each day. If you make sure to eat fewer calories than your body needs, you should lose weight. In order for calorie counting to work, you will need to eat the number of calories that are right for you in a day to lose a healthy amount of weight per week. A healthy amount of weight to lose per week is usually 1-2 lb (0.5-0.9 kg). A dietitian can determine how many calories you need in a day and give you suggestions on how to reach your calorie goal.  WHAT IS MY MY PLAN? My goal is to have __________ calories per day.  If I have this many calories per day, I should lose around __________ pounds per week. WHAT DO I NEED TO KNOW ABOUT CALORIE COUNTING? In order to meet your daily calorie goal, you will need to:  Find out how many calories are in each food you would like to eat. Try to do this before you eat.  Decide how much of the food you can eat.  Write down what you ate and   how many calories it had. Doing this is called keeping a food log. WHERE DO I FIND CALORIE INFORMATION? The number of calories in a food can be found on a Nutrition Facts label. Note that all the information on a label is based on a specific serving of the food. If a food does not have a Nutrition Facts label, try to look up the calories online or ask your dietitian for help. HOW DO I DECIDE HOW MUCH TO EAT? To decide how much of the food you can eat, you will need to consider both the number of calories in one serving and the size of one serving. This information can be found on the Nutrition Facts label. If a food does not have a Nutrition Facts label, look  up the information online or ask your dietitian for help. Remember that calories are listed per serving. If you choose to have more than one serving of a food, you will have to multiply the calories per serving by the amount of servings you plan to eat. For example, the label on a package of bread might say that a serving size is 1 slice and that there are 90 calories in a serving. If you eat 1 slice, you will have eaten 90 calories. If you eat 2 slices, you will have eaten 180 calories. HOW DO I KEEP A FOOD LOG? After each meal, record the following information in your food log:  What you ate.  How much of it you ate.  How many calories it had.  Then, add up your calories. Keep your food log near you, such as in a small notebook in your pocket. Another option is to use a mobile app or website. Some programs will calculate calories for you and show you how many calories you have left each time you add an item to the log. WHAT ARE SOME CALORIE COUNTING TIPS?  Use your calories on foods and drinks that will fill you up and not leave you hungry. Some examples of this include foods like nuts and nut butters, vegetables, lean proteins, and high-fiber foods (more than 5 g fiber per serving).  Eat nutritious foods and avoid empty calories. Empty calories are calories you get from foods or beverages that do not have many nutrients, such as candy and soda. It is better to have a nutritious high-calorie food (such as an avocado) than a food with few nutrients (such as a bag of chips).  Know how many calories are in the foods you eat most often. This way, you do not have to look up how many calories they have each time you eat them.  Look out for foods that may seem like low-calorie foods but are really high-calorie foods, such as baked goods, soda, and fat-free candy.  Pay attention to calories in drinks. Drinks such as sodas, specialty coffee drinks, alcohol, and juices have a lot of calories yet do  not fill you up. Choose low-calorie drinks like water and diet drinks.  Focus your calorie counting efforts on higher calorie items. Logging the calories in a garden salad that contains only vegetables is less important than calculating the calories in a milk shake.  Find a way of tracking calories that works for you. Get creative. Most people who are successful find ways to keep track of how much they eat in a day, even if they do not count every calorie. WHAT ARE SOME PORTION CONTROL TIPS?  Know how many calories are in a   serving. This will help you know how many servings of a certain food you can have.  Use a measuring cup to measure serving sizes. This is helpful when you start out. With time, you will be able to estimate serving sizes for some foods.  Take some time to put servings of different foods on your favorite plates, bowls, and cups so you know what a serving looks like.  Try not to eat straight from a bag or box. Doing this can lead to overeating. Put the amount you would like to eat in a cup or on a plate to make sure you are eating the right portion.  Use smaller plates, glasses, and bowls to prevent overeating. This is a quick and easy way to practice portion control. If your plate is smaller, less food can fit on it.  Try not to multitask while eating, such as watching TV or using your computer. If it is time to eat, sit down at a table and enjoy your food. Doing this will help you to start recognizing when you are full. It will also make you more aware of what and how much you are eating. HOW CAN I CALORIE COUNT WHEN EATING OUT?  Ask for smaller portion sizes or child-sized portions.  Consider sharing an entree and sides instead of getting your own entree.  If you get your own entree, eat only half. Ask for a box at the beginning of your meal and put the rest of your entree in it so you are not tempted to eat it.  Look for the calories on the menu. If calories are listed,  choose the lower calorie options.  Choose dishes that include vegetables, fruits, whole grains, low-fat dairy products, and lean protein. Focusing on smart food choices from each of the 5 food groups can help you stay on track at restaurants.  Choose items that are boiled, broiled, grilled, or steamed.  Choose water, milk, unsweetened iced tea, or other drinks without added sugars. If you want an alcoholic beverage, choose a lower calorie option. For example, a regular margarita can have up to 700 calories and a glass of wine has around 150.  Stay away from items that are buttered, battered, fried, or served with cream sauce. Items labeled "crispy" are usually fried, unless stated otherwise.  Ask for dressings, sauces, and syrups on the side. These are usually very high in calories, so do not eat much of them.  Watch out for salads. Many people think salads are a healthy option, but this is often not the case. Many salads come with bacon, fried chicken, lots of cheese, fried chips, and dressing. All of these items have a lot of calories. If you want a salad, choose a garden salad and ask for grilled meats or steak. Ask for the dressing on the side, or ask for olive oil and vinegar or lemon to use as dressing.  Estimate how many servings of a food you are given. For example, a serving of cooked rice is  cup or about the size of half a tennis ball or one cupcake wrapper. Knowing serving sizes will help you be aware of how much food you are eating at restaurants. The list below tells you how big or small some common portion sizes are based on everyday objects.  1 oz--4 stacked dice.  3 oz--1 deck of cards.  1 tsp--1 dice.  1 Tbsp-- a Ping-Pong ball.  2 Tbsp--1 Ping-Pong ball.   cup--1 tennis ball   or 1 cupcake wrapper.  1 cup--1 baseball. Document Released: 10/20/2005 Document Revised: 03/06/2014 Document Reviewed: 08/25/2013 ExitCare Patient Information 2015 [employer]. This  information is not intended to replace advice given to you by your health care provider. Make sure you discuss any questions you have with your health care provider.  

## 2014-05-25 NOTE — Progress Notes (Signed)
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 Pt reports having swelling and numbness in her hands and pain in her left leg.

## 2014-05-26 ENCOUNTER — Ambulatory Visit (HOSPITAL_COMMUNITY)
Admission: RE | Admit: 2014-05-26 | Discharge: 2014-05-26 | Disposition: A | Discharge status: Home / Self Care | Payer: Self-pay | Source: Ambulatory Visit | Attending: Internal Medicine | Admitting: Internal Medicine

## 2014-05-26 DIAGNOSIS — M7989 Other specified soft tissue disorders: Secondary | ICD-10-CM | POA: Insufficient documentation

## 2014-05-26 DIAGNOSIS — I82409 Acute embolism and thrombosis of unspecified deep veins of unspecified lower extremity: Secondary | ICD-10-CM

## 2014-05-26 DIAGNOSIS — M79609 Pain in unspecified limb: Secondary | ICD-10-CM

## 2014-05-26 LAB — VITAMIN D 25 HYDROXY (VIT D DEFICIENCY, FRACTURES): VIT D 25 HYDROXY: 22 ng/mL — AB (ref 30–89)

## 2014-05-26 LAB — TSH: TSH: 0.423 u[IU]/mL (ref 0.350–4.500)

## 2014-05-26 NOTE — Progress Notes (Signed)
*  Preliminary Results* Left lower extremity venous duplex completed. Left lower extremity is negative for deep vein thrombosis. There is no evidence of left Baker's cyst.  05/26/2014 4:56 PM  Maudry Mayhew, RVT, RDCS, RDMS

## 2014-06-07 ENCOUNTER — Telehealth: Payer: Self-pay | Admitting: Emergency Medicine

## 2014-06-07 MED ORDER — VITAMIN D (ERGOCALCIFEROL) 1.25 MG (50000 UNIT) PO CAPS: 50000.0000 [IU] | ORAL_CAPSULE | ORAL | Status: DC

## 2014-06-07 MED ORDER — ATORVASTATIN CALCIUM 40 MG PO TABS: 40.0000 mg | ORAL_TABLET | Freq: Every day | ORAL | Status: DC

## 2014-06-07 NOTE — Telephone Encounter (Signed)
Message copied by Ricci Barker on Wed Jun 07, 2014  3:19 PM ------      Message from: Tammy [name]      Created: Wed May 31, 2014  5:58 PM       Please inform patient that her left leg ultrasound is negative for blood clot ------

## 2014-06-07 NOTE — Telephone Encounter (Signed)
Pt daughter given u/s results with new medication to start taking for cholesterol/Vitamin D Medications e-scribed to [facility] per pt request

## 2014-06-07 NOTE — Telephone Encounter (Signed)
Attempted to give pt lab results with med instructions. Both number listed not working Medications Lipitor 40 mg tab daily, Vitamin D 50,000 units capsule ordered and e-scribed to pt pharmacy

## 2014-06-07 NOTE — Telephone Encounter (Signed)
Message copied by Ricci Barker on Wed Jun 07, 2014  3:08 PM ------      Message from: Tresa Garter      Created: Fri Jun 02, 2014  4:15 PM       Please inform patient that her laboratory tests results shows very high cholesterol level and low vitamin D, will need to start her on cholestyramine vitamin D supplement.            Please call in prescription atorvastatin 40 mg tablet by mouth daily, 90 tablets with 3 refills      Please call in prescription ergocalciferol 50,000 units capsule by mouth weekly for 12 weeks ------

## 2014-06-07 NOTE — Telephone Encounter (Signed)
Message copied by Ricci Barker on Wed Jun 07, 2014  3:20 PM ------      Message from: Angelica Chessman E      Created: Wed May 31, 2014  5:58 PM       Please inform patient that her left leg ultrasound is negative for blood clot ------

## 2014-06-07 NOTE — Telephone Encounter (Signed)
Pt. Called regarding results. Please f/u with pt.

## 2014-06-07 NOTE — Telephone Encounter (Signed)
Pt given lab results with medication instructions for cholesterol and Vitamin D Medication e-scribed to [facility]

## 2014-06-12 MED ORDER — VALSARTAN-HYDROCHLOROTHIAZIDE 160 MG-12.5 MG TAB
ORAL_TABLET | Freq: Every day | ORAL | Status: DC
Start: 2014-06-12 — End: 2018-02-01

## 2014-06-12 NOTE — Progress Notes (Signed)
.  1. Have you been to the ER, urgent care clinic since your last visit?  Hospitalized since your last visit?No    2. Have you seen or consulted any other health care providers outside of the [facility] since your last visit?  Include any pap smears or colon screening. No

## 2014-06-12 NOTE — Progress Notes (Signed)
[facility]  Carol Adaoderick Yvetta Drotar, MD, Union[place]ACP, CMD  [address]  [address]   [place] [id number]  Phone:  [phone]  Fax: [phone]       Chief Complaint   Patient presents with   ??? Hypertension   .      SUBJECTIVE:    Vanessa [name] is a 58 y.o. female Patient returns today for follow-up with a known history of hypertension, hepatic steatosis, and depressive neuroses.  Since we last saw her she has found a new psychiatrist who she has seen on one occasion and apparently was helpful.  Patient actually feels that she is doing better and is feeling better.            Current Outpatient Prescriptions   Medication Sig Dispense Refill   ??? valsartan-hydrochlorothiazide (DIOVAN-HCT) 160-12.5 mg per tablet Take 1 Tab by mouth daily. 30 Tab 11   ??? DULoxetine (CYMBALTA) 30 mg capsule Take 1 Cap by mouth two (2) times a day. 60 Cap 1   ??? busPIRone (BUSPAR) 30 mg tablet Take 1 Tab by mouth two (2) times a day. 60 Tab 1   ??? carvedilol (COREG) 12.5 mg tablet Take 1 Tab by mouth two (2) times daily (with meals). 60 Tab 11   ??? vit B Cmplx 3-FA-Vit C-Biotin (NEPHRO VITE RX) 1-60-300 mg-mg-mcg tablet Take 1 Tab by mouth daily.     ??? CALCIUM-MAGNESIUM-VITAMIN D2 PO Take  by mouth.     ??? Cholecalciferol, Vitamin D3, (VITAMIN D3) 2,000 unit cap capsule Take 2,000 Units by mouth daily. 30 Cap 11   ??? therapeutic multivitamin (THERAGRAN) tablet Take 1 Tab by mouth daily.     ??? VAYARIN 75-8.5-21.5 mg cap      ??? progesterone (PROMETRIUM) 100 mg capsule        Past Medical History   Diagnosis Date   ??? Hypertension    ??? Normal cardiac stress test 02-10-12   ??? Hepatic steatosis    ??? Depression    ??? H/O colonoscopy 09-24-12     diverticulosis     Past Surgical History   Procedure Laterality Date   ??? Hx gyn       tubal ligation     Allergies   Allergen Reactions   ??? Amlodipine Other (comments)     Loss of memory,nervous,jettery   ??? Losartan Other (comments)     Nervous,jettery   ??? Nifedipine Other (comments)      Swelling,palpations         REVIEW OF SYSTEMS:  General: negative for - chills or fever  ENT: negative for - headaches, nasal congestion or tinnitus  Respiratory: negative for - cough, hemoptysis, shortness of breath or wheezing  Cardiovascular : negative for - chest pain, edema, palpitations or shortness of breath  Gastrointestinal: negative for - abdominal pain, blood in stools, heartburn or nausea/vomiting  Genito-Urinary: no dysuria, trouble voiding, or hematuria  Musculoskeletal: negative for - gait disturbance, joint pain, joint stiffness or joint swelling  Neurological: no TIA or stroke symptoms  Hematologic: no bruises, no bleeding, no swollen glands  Integument: no lumps, mole changes, nail changes or rash  Endocrine: no malaise/lethargy or unexpected weight changes      History     Social History   ??? Marital Status: MARRIED     Spouse Name: N/A     Number of Children: N/A   ??? Years of Education: N/A     Social History Main Topics   ???  Smoking status: Never Smoker    ??? Smokeless tobacco: Never Used   ??? Alcohol Use: No   ??? Drug Use: No   ??? Sexual Activity:     Partners: Male     Pharmacist, hospital Protection: None     Other Topics Concern   ??? Not on file     Social History Narrative     Family History   Problem Relation Age of Onset   ??? Heart Disease Mother    ??? Heart Disease Father        OBJECTIVE:    BP 155/93 mmHg   Pulse 61   Temp(Src) 97 ??F (36.1 ??C) (Oral)   Resp 18   Ht 5\' 7"  (1.702 m)   Wt 153 lb (69.4 kg)   BMI 23.96 kg/m2   SpO2 98%  CONSTITUTIONAL: well , well nourished, appears age appropriate  EYES: perrla, eom intact  ENMT:moist mucous membranes, pharynx clear  NECK: supple. Thyroid normal  RESPIRATORY: Chest: clear to ascultation and percussion   CARDIOVASCULAR: Heart: regular rate and rhythm  GASTROINTESTINAL: Abdomen: soft, bowel sounds active  HEMATOLOGIC: no pathological lymph nodes palpated  MUSCULOSKELETAL: Extremities: no edema, pulse 1+    INTEGUMENT: No unusual rashes or suspicious skin lesions noted. Nails appear normal.  NEUROLOGIC: non-focal exam   MENTAL STATUS: alert and oriented, appropriate affect      ASSESSMENT:  1. Elevated liver enzymes    2. Anxiety and depression    3. Major depressive disorder, single episode, moderate (HCC)    4. Headache    5. Essential hypertension      Since we last saw her she also saw a holistic doctor who gave her something for memory.  Gave her something for vitamin D and told her that her hormone levels were low and she needed some DHEA.  She was placed on Vayarin by Dr. Katherina Right and also progesterone 10 mg.  She feels much better now.  Patient's medical status is stable.  She is almost euphoric because she is feeling so much better.      Blood pressure is elevated at 162/100.  She is taking her medications regularly and we will make an adjustment in her Nifedipine to increase it to 60 mg daily as we would like the blood pressure in the 130's/80's.  She is within her ideal body weight with a BMI of 23.96.  The only addition is that we encourage her to exercise for 30 minutes five days a week.  We advise her that we are very happy with her well being currently.      End of life issues have been discussed.  Patient has a good understanding of our recommendations.  She will return to see [place] in one month for a blood pressure check, four months to see me.      Blood pressure elevation is likely elevated as she stopped the Nifedipine because it caused her not to feel good.  It caused swelling and palpitations.     She was previously taking Losartan but stopped it but that was around the time that her mother died and we wonder if there was not a relationship to the ill effects on that drug and her mom's death.  I think Valsartan will be effective in controlling her blood pressure.          PLAN:  .  Orders Placed This Encounter   ??? VAYARIN 75-8.5-21.5 mg cap   ??? progesterone (PROMETRIUM) 100 mg capsule    ???  valsartan-hydrochlorothiazide (DIOVAN-HCT) 160-12.5 mg per tablet       Follow-up Disposition:  Return in about 4 weeks (around 07/10/2014) for bp check.      ATTENTION:   This medical record was transcribed using an electronic medical records system.  Although proofread, it may and can contain electronic and spelling errors.  Other human spelling and other errors may be present.  Corrections may be executed at a later time.  Please feel free to contact us for any clarifications as needed.

## 2014-06-12 NOTE — Patient Instructions (Signed)
High Blood Pressure: After Your Visit  Your Care Instructions  If your blood pressure is usually above 140/90, you have high blood pressure, or hypertension. Despite what a lot of people think, high blood pressure usually doesn't cause headaches or make you feel dizzy or lightheaded. It usually has no symptoms. But it does increase your risk for heart attack, stroke, and kidney or eye damage. The higher your blood pressure, the more your risk increases.  Your doctor will give you a goal for your blood pressure. Your goal will be based on your health and your age. An example of a goal is to keep your blood pressure below 140/90.  Lifestyle changes, such as eating healthy and being active, are always important to help lower blood pressure. You might also take medicine to reach your blood pressure goal.  Follow-up care is a key part of your treatment and safety. Be sure to make and go to all appointments, and call your doctor if you are having problems. It's also a good idea to know your test results and keep a list of the medicines you take.  How can you care for yourself at home?  Medical treatment  ?? If you stop taking your medicine, your blood pressure will go back up. You may take one or more types of medicine to lower your blood pressure. Be safe with medicines. Take your medicine exactly as prescribed. Call your doctor if you think you are having a problem with your medicine.  ?? Your doctor may suggest that you take one low-dose aspirin (81 mg) a day. This can help reduce your risk of having a stroke or heart attack.  ?? See your doctor regularly. You may need to see the doctor more often at first or until your blood pressure comes down.  ?? If you are taking blood pressure medicine, talk to your doctor before you take decongestants or anti-inflammatory medicine, such as ibuprofen. Some of these medicines can raise blood pressure.  ?? Learn how to check your blood pressure at home.  Lifestyle changes   ?? Stay at a healthy weight. This is especially important if you put on weight around the waist. Losing even 10 pounds can help you lower your blood pressure.  ?? If your doctor recommends it, get more exercise. Walking is a good choice. Bit by bit, increase the amount you walk every day. Try for at least 30 minutes on most days of the week. You also may want to swim, bike, or do other activities.  ?? Avoid or limit alcohol. Talk to your doctor about whether you can drink any alcohol.  ?? Try to limit how much sodium you eat to less than 2,300 milligrams (mg) a day. Your doctor may ask you to try to eat less than 1,500 mg a day.  ?? Eat plenty of fruits (such as bananas and oranges), vegetables, legumes, whole grains, and low-fat dairy products.  ?? Lower the amount of saturated fat in your diet. Saturated fat is found in animal products such as milk, cheese, and meat. Limiting these foods may help you lose weight and also lower your risk for heart disease.  ?? Do not smoke. Smoking increases your risk for heart attack and stroke. If you need help quitting, talk to your doctor about stop-smoking programs and medicines. These can increase your chances of quitting for good.  When should you call for help?  Call your doctor now or seek immediate medical care if:  ?? Your   blood pressure is much higher than normal (such as 180/110 or higher).  ?? You think high blood pressure is causing symptoms such as:  ?? Severe headache.  ?? Blurry vision.  Watch closely for changes in your health, and be sure to contact your doctor if:  ?? You do not get better as expected.   Where can you learn more?   Go to http://www.healthwise.net/[facility]  Enter X567 in the search box to learn more about "High Blood Pressure: After Your Visit."   ?? 2006-2015 Healthwise, Inc[employer]. Care instructions adapted under license by  (which disclaims liability or warranty for this information). This care instruction is for use with your licensed  healthcare professional. If you have questions about a medical condition or this instruction, always ask your healthcare professional. Healthwise, Incorporated disclaims any warranty or liability for your use of this information.  Content Version: 10.5.422740; Current as of: December 23, 2013

## 2014-06-26 MED ORDER — LINDANE 1 % LOTION
1 % | CUTANEOUS | Status: AC
Start: 2014-06-26 — End: 2014-06-26

## 2014-06-26 NOTE — Telephone Encounter (Signed)
Patient called stating that her grandaughter was diagnosed with scabies and they should be treated. rx for lindane lotion sent to pharmacy.

## 2014-08-11 ENCOUNTER — Encounter

## 2014-08-11 MED ORDER — DULOXETINE 30 MG CAP, DELAYED RELEASE
30 mg | ORAL_CAPSULE | Freq: Two times a day (BID) | ORAL | Status: DC
Start: 2014-08-11 — End: 2014-09-06

## 2014-09-04 ENCOUNTER — Encounter: Payer: Self-pay | Admitting: Internal Medicine

## 2014-09-06 ENCOUNTER — Inpatient Hospital Stay
Admit: 2014-09-06 | Discharge: 2014-09-06 | Disposition: A | Discharge status: Home / Self Care | Payer: BLUE CROSS/BLUE SHIELD | Attending: Family Medicine

## 2014-09-06 ENCOUNTER — Inpatient Hospital Stay
Admit: 2014-09-06 | Discharge: 2014-09-07 | Disposition: A | Discharge status: Home / Self Care | Payer: BLUE CROSS/BLUE SHIELD | Attending: Emergency Medicine

## 2014-09-06 DIAGNOSIS — R079 Chest pain, unspecified: Secondary | ICD-10-CM

## 2014-09-06 DIAGNOSIS — R0789 Other chest pain: Secondary | ICD-10-CM

## 2014-09-06 LAB — EKG, 12 LEAD, INITIAL
Atrial Rate: 59 {beats}/min
Calculated P Axis: 60 degrees
Calculated R Axis: 38 degrees
Calculated T Axis: 20 degrees
P-R Interval: 146 ms
Q-T Interval: 362 ms
QRS Duration: 82 ms
QTC Calculation (Bezet): 358 ms
Ventricular Rate: 59 {beats}/min

## 2014-09-06 LAB — METABOLIC PANEL, COMPREHENSIVE
A-G Ratio: 0.9 — ABNORMAL LOW (ref 1.1–2.2)
ALT (SGPT): 28 U/L (ref 12–78)
AST (SGOT): 31 U/L (ref 15–37)
Albumin: 3.1 g/dL — ABNORMAL LOW (ref 3.5–5.0)
Alk. phosphatase: 65 U/L (ref 45–117)
Anion gap: 4 mmol/L — ABNORMAL LOW (ref 5–15)
BUN/Creatinine ratio: 14 (ref 12–20)
BUN: 14 MG/DL (ref 6–20)
Bilirubin, total: 0.2 MG/DL (ref 0.2–1.0)
CO2: 23 mmol/L (ref 21–32)
Calcium: 7.4 MG/DL — ABNORMAL LOW (ref 8.5–10.1)
Chloride: 113 mmol/L — ABNORMAL HIGH (ref 97–108)
Creatinine: 0.98 MG/DL (ref 0.55–1.02)
GFR est AA: >60 mL/min/{1.73_m2} (ref >60)
GFR est non-AA: 59 mL/min/{1.73_m2} — ABNORMAL LOW (ref >60)
Globulin: 3.3 g/dL (ref 2.0–4.0)
Glucose: 74 mg/dL (ref 65–100)
Potassium: 3.7 mmol/L (ref 3.5–5.1)
Protein, total: 6.4 g/dL (ref 6.4–8.2)
Sodium: 140 mmol/L (ref 136–145)

## 2014-09-06 LAB — CBC WITH AUTOMATED DIFF
ABS. BASOPHILS: 0 10*3/uL (ref 0.0–0.1)
ABS. EOSINOPHILS: 0.2 10*3/uL (ref 0.0–0.4)
ABS. LYMPHOCYTES: 3.1 10*3/uL (ref 0.8–3.5)
ABS. MONOCYTES: 0.3 10*3/uL (ref 0.0–1.0)
ABS. NEUTROPHILS: 4.8 10*3/uL (ref 1.8–8.0)
BASOPHILS: 0 % (ref 0–1)
EOSINOPHILS: 2 % (ref 0–7)
HCT: 47.4 % — ABNORMAL HIGH (ref 35.0–47.0)
HGB: 16.2 g/dL — ABNORMAL HIGH (ref 11.5–16.0)
LYMPHOCYTES: 37 % (ref 12–49)
MCH: 32.1 PG (ref 26.0–34.0)
MCHC: 34.2 g/dL (ref 30.0–36.5)
MCV: 94 FL (ref 80.0–99.0)
MONOCYTES: 3 % — ABNORMAL LOW (ref 5–13)
NEUTROPHILS: 58 % (ref 32–75)
PLATELET: 444 10*3/uL — ABNORMAL HIGH (ref 150–400)
RBC: 5.04 M/uL (ref 3.80–5.20)
RDW: 14.2 % (ref 11.5–14.5)
WBC: 8.4 10*3/uL (ref 3.6–11.0)

## 2014-09-06 LAB — CK W/ CKMB & INDEX
CK - MB: <0.5 NG/ML — ABNORMAL LOW (ref 0.5–3.6)
CK: 106 U/L (ref 26–192)

## 2014-09-06 LAB — TROPONIN I: Troponin-I, Qt.: <0.04 ng/mL (ref <0.05)

## 2014-09-06 NOTE — ED Notes (Signed)
Pt to US.

## 2014-09-06 NOTE — ED Notes (Signed)
Bedside shift change report given to Waldo LaineKaitlin, Charity fundraiserN (Cabin crewoncoming nurse) by Florentina AddisonKatie, RN (offgoing nurse). Report included the following information SBAR, ED Summary, Intake/Output, MAR and Recent Results.

## 2014-09-06 NOTE — Other (Addendum)
Patient is a 58 y.o. female presenting with chest pain and dizziness. The history is provided by the patient.   Chest Pain (Angina)   This is a new problem. Episode onset: chest pressure for hours today before lunch, report was not feeling herself, deny n/v. The problem has not changed since onset.The problem occurs constantly. The pain is present in the left side. The pain is at a severity of 7/10. The pain is moderate. The quality of the pain is described as pressure-like. Associated symptoms include dizziness. Pertinent negatives include no abdominal pain, no back pain, no claudication, no cough, no diaphoresis, no exertional chest pressure, no fever, no headaches, no hemoptysis, no irregular heartbeat, no leg pain, no lower extremity edema, no malaise/fatigue, no nausea, no near-syncope, no numbness, no orthopnea, no palpitations, no PND, no shortness of breath, no sputum production, no vomiting and no weakness. She has tried nothing for the symptoms.   Dizziness  This is a new problem. Episode onset: started after lunch, and is felt  more with walking. The problem has not changed since onset.Primary symptoms include disorientation.Pertinent negatives include no focal weakness, no loss of sensation, no loss of balance, no slurred speech, no speech difficulty, no memory loss, no movement disorder, no agitation, no visual change, no auditory change, no mental status change and no unresponsiveness. Primary symptoms comment: stated felt "not hersellf this morning" . There has been no fever. Associated symptoms include chest pain. Pertinent negatives include no shortness of breath, no vomiting, no headaches and no nausea.        Past Medical History   Diagnosis Date   ??? Hypertension    ??? Normal cardiac stress test 02-10-12   ??? Hepatic steatosis    ??? Depression    ??? H/O colonoscopy 09-24-12     diverticulosis        Past Surgical History   Procedure Laterality Date   ??? Hx gyn       tubal ligation          Family History   Problem Relation Age of Onset   ??? Heart Disease Mother    ??? Heart Disease Father         History     Social History   ??? Marital Status: MARRIED     Spouse Name: N/A     Number of Children: N/A   ??? Years of Education: N/A     Occupational History   ??? Not on file.     Social History Main Topics   ??? Smoking status: Never Smoker    ??? Smokeless tobacco: Never Used   ??? Alcohol Use: No   ??? Drug Use: No   ??? Sexual Activity:     Partners: Male     Pharmacist, hospitalBirth Control/ Protection: None     Other Topics Concern   ??? Not on file     Social History Narrative                ALLERGIES: Amlodipine; Losartan; and Nifedipine    Review of Systems   Constitutional: Negative for fever, malaise/fatigue and diaphoresis.   Respiratory: Negative for cough, hemoptysis, sputum production and shortness of breath.    Cardiovascular: Positive for chest pain. Negative for palpitations, orthopnea, claudication, PND and near-syncope.   Gastrointestinal: Negative for nausea, vomiting and abdominal pain.   Musculoskeletal: Negative for back pain.   Neurological: Positive for dizziness. Negative for focal weakness, speech difficulty, weakness, numbness, headaches and loss of balance.  Psychiatric/Behavioral: Negative for memory loss and agitation.   All other systems reviewed and are negative.      Filed Vitals:    09/06/14 1439   BP: 162/78   Pulse: 70   Temp: 97.9 ??F (36.6 ??C)   Resp: 18   Height: 5\' 7"  (1.702 m)   Weight: 69.4 kg (153 lb)   SpO2: 97%       Physical Exam   Constitutional: She is oriented to person, place, and time. She appears well-developed and well-nourished.   Flat, tired, flat affect   HENT:   Head: Normocephalic.   Right Ear: Hearing, tympanic membrane, external ear and ear canal normal.   Left Ear: Hearing, tympanic membrane, external ear and ear canal normal.   Nose: Nose normal. No mucosal edema.   Mouth/Throat: Uvula is midline, oropharynx is clear and moist and mucous membranes are normal.   No facial droop   Equal smile and able to raise eyebrows     Eyes: Conjunctivae and EOM are normal. Pupils are equal, round, and reactive to light. Right eye exhibits no discharge. Left eye exhibits no discharge.   Neck: Normal range of motion. Neck supple.   Cardiovascular: Normal rate and regular rhythm.    7/10 left chest pressure  Not reproducible   Pulmonary/Chest: Effort normal and breath sounds normal. No respiratory distress. She has no wheezes. She has no rales. She exhibits no tenderness.   Abdominal: Soft. Bowel sounds are normal. She exhibits no distension and no mass. There is no tenderness. There is no rebound and no guarding.   Musculoskeletal: Normal range of motion.   Lymphadenopathy:     She has no cervical adenopathy.   Neurological: She is alert and oriented to person, place, and time. No cranial nerve deficit. Coordination normal.   Uvula midline  Equal hand and shoulder strength and grasp  Able to follow commands  Unable to tandem walk as pt feels unsteady  Able to raise both hands without drift  Able to repeat word phrase without speech impairment   Skin: Skin is warm and dry.   Psychiatric:   Pt reports not feeling herself this am  Flat tired affect   Vitals reviewed.      MDM     Differential Diagnosis; Clinical Impression; Plan:     Chest pressure  7/10  Report hydrating self well today  Dizziness/unsteady gait  EKG     Ed for further eval  Recheck BP            EKG    Date/Time: 09/06/2014 3:13 PM  Performed by: Sharyn DrossANDREWS, Hailie Searight K  Authorized by: Sharyn DrossANDREWS, Crespin Forstrom K  Comparison: compared with previous ECG from 02/16/2013  Similar to previous ECG  Comparison to previous ECG: NSR  Non specific T wave  Rhythm: sinus rhythm  Rate: normal  BPM: 65  Clinical impression: abnormal ECG  Clinical impression comment: normal sinus rhythm, nonspecific T wave

## 2014-09-06 NOTE — Other (Signed)
After evaluation pt advised to go to Ed for further evaluation. She agreed. Pt doesn't want to go by EMS, she is calling a friend to take her. Report called to [facility]

## 2014-09-06 NOTE — ED Notes (Signed)
Report attempted.

## 2014-09-06 NOTE — ED Notes (Signed)
Chest pressure started this am, +sob, constant pressure, denies nausea, radiates to left shoulder blades and both arms, right lower leg cramping pain x 1-2 days

## 2014-09-06 NOTE — ED Provider Notes (Signed)
HPI Comments: 58 y.o. female with past medical history significant for HTN, depression, and diverticulosis who presents from home with chief complaint of chest pain.  Pt states sudden onset today "around lunchtime" of chest pain.  Pt reports having constant, sharp "9/10" chest pain located in her center chest region.  Pt describes her pain as a "pressure" and she states it "feels like someone is sitting on (her) chest."  Pt denies radiation of her pain.  Pt reports having aggravated pain with pleuritic movement and she denies having relieving factors for her pain.  Pt states this pain is not new and she reports having similar chest pain "two years ago."   Pt states she has had accompanying dizziness and SOB.  [facility] and was recommended to visit the ED.  Pt denies taking ASA today.  Pt denies recently taking long trips.  Pt denies having a personal or family history of blood clots.  Pt had a nuclear stress test done in 2013 that Pt states was "normal."  Pt denies having diaphoresis, nausea, vomiting, leg pain, or leg swelling.  There are no other acute medical concerns at this time.    Old Chart Review: Stress test done 02/09/14 IMPRESSION - No evidence of myocardium at ischemic risk. Normal wall motion with an EF of 56 %.    Social hx: Tobacco: No, Alcohol: No, Drugs: No    Family Hx: Father - MI ("in 3840s")    PCP: Cristine PolioODERICK E HAITHCOCK, MD    Cardiologist: Dr. Brooke BonitoAbramson    Note written by Juan QuamZachary A. Philippi, Neurosurgeoncribe, as dictated by Vella Kohlerandall M Shamicka Inga, MD 7:02 PM            The history is provided by the patient.        Past Medical History   Diagnosis Date   ??? Hypertension    ??? Normal cardiac stress test 02-10-12   ??? Hepatic steatosis    ??? Depression    ??? H/O colonoscopy 09-24-12     diverticulosis   ??? Pneumonia    ;     Past Surgical History   Procedure Laterality Date   ??? Hx gyn       tubal ligation         Family History   Problem Relation Age of Onset    ??? Heart Disease Mother    ??? Heart Disease Father         History     Social History   ??? Marital Status: MARRIED     Spouse Name: N/A     Number of Children: N/A   ??? Years of Education: N/A     Occupational History   ??? Not on file.     Social History Main Topics   ??? Smoking status: Never Smoker    ??? Smokeless tobacco: Never Used   ??? Alcohol Use: No   ??? Drug Use: No   ??? Sexual Activity:     Partners: Male     Pharmacist, hospitalBirth Control/ Protection: None     Other Topics Concern   ??? Not on file     Social History Narrative        ALLERGIES: Amlodipine; Losartan; and Nifedipine      Review of Systems   Constitutional: Negative for diaphoresis.   HENT: Negative for congestion.    Respiratory: Positive for shortness of breath. Negative for cough.    Cardiovascular: Positive for chest pain. Negative for  leg swelling.   Gastrointestinal: Negative for nausea, vomiting, abdominal pain, diarrhea and constipation.   Genitourinary: Negative for dysuria and difficulty urinating.   Musculoskeletal: Negative for back pain.        Negative for leg pain.   Skin: Negative for rash.   Neurological: Positive for dizziness. Negative for weakness and headaches.   Psychiatric/Behavioral: Negative for confusion.   All other systems reviewed and are negative.      Filed Vitals:    09/06/14 1915 09/06/14 1930 09/06/14 1941 09/06/14 1945   BP: 165/91 142/87 157/94 142/86   Pulse: 73 66 65 67   Temp:       Resp: 17 18 28 17    Height:       Weight:       SpO2: 100% 100% 100% 95%            Physical Exam     Nursing note and vitals reviewed.  Constitutional: appears well-developed and well-nourished. No distress.   HENT:   Head: Normocephalic and atraumatic. Sclera anicteric  Nose: No rhinorrhea  Mouth/Throat: Oropharynx is clear and moist. Pharynx normal  Eyes: Conjunctivae are normal. Pupils are equal, round, and reactive to light. Right eye exhibits no discharge. Left eye exhibits no discharge. No scleral icterus.    Neck: Painless normal range of motion. Supple  Cardiovascular: Normal rate, regular rhythm, normal heart sounds and intact distal pulses.  Exam reveals no gallop and no friction rub.  No murmur heard.  Pulmonary/Chest: Effort normal and breath sounds normal. No respiratory distress. no wheezes. no rales.   Abdominal: Soft. Bowel sounds are normal. Exhibits no distension and no mass. No tenderness. No guarding.   Musculoskeletal: Normal range of motion. no tenderness. No edema  Lymphadenopathy:   No cervical adenopathy.   Neurological:  Alert and oriented to person, place, and time. Coordination normal. CN 2-12 intact.  Moving all extremities.    Skin: Skin is warm and dry. No rash noted. No pallor.         MDM    Substernal cp.  Last nuclear stress test 2+ years ago was negative.  ekg without ischemic changes.  DDX:  MI, angina, PE, MSK, GERD and others.  Check cxr, labs.  Aspirin.  NTG SL.      Procedures    ED EKG interpretation:  Rhythm: sinus bradycardia; Rate (approx.): 59 bpm; Normal Axis.  Normal Intervals.  No ST/T wave changes.  Note written by Juan QuamZachary A. Philippi, Neurosurgeoncribe, as dictated by Vella Kohlerandall M Sana Tessmer, MD 7:56 PM    8:19 PM  Vella Kohlerandall M Arie Gable, MD spoke with Dr. Welton FlakesKhan, Consult for Hospitalist. Discussed available diagnostic tests and clinical findings. He/She is in agreement with care plans as outlined. He will evaluate patient.

## 2014-09-06 NOTE — ED Notes (Signed)
MD at bedside

## 2014-09-06 NOTE — Progress Notes (Signed)
Admission Medication Reconciliation:  Information obtained from: pt and admin nurse  Significant PMH/Disease States:   Past Medical History   Diagnosis Date   ??? Hypertension    ??? Normal cardiac stress test 02-10-12   ??? Hepatic steatosis    ??? Depression    ??? H/O colonoscopy 09-24-12     diverticulosis   ??? Pneumonia    Chief Complaint for this Admission:  Chest pain  Allergies:  Amlodipine; Losartan; and Nifedipine  Prior to Admission Medications:   Prior to Admission Medications   Prescriptions Last Dose Informant Patient Reported? Taking?   CALCIUM-MAGNESIUM-VITAMIN D2 PO 09/06/2014 at am  Yes Yes   Sig: Take 1 Packet by mouth daily.   DULoxetine (CYMBALTA) 30 mg capsule 09/06/2014 at Unknown time  Yes Yes   Sig: Take 30 mg by mouth daily.   VAYARIN 75-8.5-21.5 mg cap 09/06/2014 at am  Yes Yes   Sig: Take 1 Cap by mouth two (2) times a day.   busPIRone (BUSPAR) 30 mg tablet 09/06/2014 at am  No Yes   Sig: Take 1 Tab by mouth two (2) times a day.   minoxidil (ROGAINE) 2 % external solution 09/06/2014 at am  Yes Yes   Sig: Apply  to affected area two (2) times a day. Apply to scalp and rub it in   progesterone (PROMETRIUM) 100 mg capsule 09/06/2014 at am  Yes Yes   Sig: Take 100 mg by mouth daily.   valsartan-hydrochlorothiazide (DIOVAN-HCT) 160-12.5 mg per tablet 09/06/2014 at am  No Yes   Sig: Take 1 Tab by mouth daily.   vit B Cmplx 3-FA-Vit C-Biotin (NEPHRO VITE RX) 1-60-300 mg-mg-mcg tablet 09/06/2014 at am  Yes Yes   Sig: Take 1 Tab by mouth daily.      Facility-Administered Medications: None   Comments/Recommendations:   none

## 2014-09-06 NOTE — H&P (Addendum)
History & Physical    Date of admission: 09/06/2014    Patient name: Vanessa [name]  MRN: [id number]  Date of birth: [birth year]  Age: 58 y.o.     Primary care provider:  Erin Hearing, MD     Source of Information: patient, ED/ medical records                               Chief complain:  Chest pain    History of present illness  Vanessa [name] is a 58 y.o. female with past medical history of hypertension, depression, hepatic steatosis, diverticulosis, and pneumonia presented to the ED from home with chief complaint of chest pain.  Symptom onset reportedly began today with severe, constant, non-radiating chest pain, located in substernal/ chest region on chest, described as both sharp and pressure (like someone on chest), aggravated by deep breathing, without specific alleviating factors.  Patient reportedly had similar symptoms over the past 2 years and had nuclear medicine stress test on 02/10/2012( which showed:  No evidence of myocardium at ischemic risk. Normal wall motion   with an EF of 56 %.).  Today, patient was seen at [place] [facility] and then she was referred to the ED.  On arrival in the ED, initial recorded vital signs were BP= 167/107, HR= 71, RR=16, and O2sats=100% on room air.  Troponin (initial) level was within normal limits.  Patient was given Aspirin 324 mg po x 1 dose and sublingual NTG was ordered per the ED.  Patient is now seen for admission to our hospitalist service for continued evaluation and treatments.  Patient also has some varied other symptoms including right leg pain and numbness.  There were no reports of new onset syncope, loss of consciousness, headache, neck pain, visual disturbance, focal weakness, palpitations, abdominal pain, nausea, vomiting, diarrhea, calf pain, increased leg swelling/ edema, cough, fever, chills.     Past Medical History   Diagnosis Date   ??? Hypertension     ??? Normal cardiac stress test 02-10-12   ??? Hepatic steatosis    ??? Depression    ??? H/O colonoscopy 09-24-12     diverticulosis   ??? Pneumonia       Past Surgical History   Procedure Laterality Date   ??? Hx gyn       tubal ligation     Prior to Admission medications    Medication Sig Start Date End Date Taking? Authorizing Provider   minoxidil (ROGAINE) 2 % external solution Apply  to affected area two (2) times a day. Apply to scalp and rub it in   Yes Historical Provider   DULoxetine (CYMBALTA) 30 mg capsule Take 30 mg by mouth daily.   Yes Historical Provider   VAYARIN 75-8.5-21.5 mg cap Take 1 Cap by mouth two (2) times a day. 05/29/14  Yes Historical Provider   progesterone (PROMETRIUM) 100 mg capsule Take 100 mg by mouth daily. 06/09/14  Yes Historical Provider   valsartan-hydrochlorothiazide (DIOVAN-HCT) 160-12.5 mg per tablet Take 1 Tab by mouth daily. 06/12/14  Yes Erin Hearing, MD   busPIRone (BUSPAR) 30 mg tablet Take 1 Tab by mouth two (2) times a day. 05/04/14  Yes Tara A Crisinati, NP   vit B Cmplx 3-FA-Vit C-Biotin (NEPHRO VITE RX) 1-60-300 mg-mg-mcg tablet Take 1 Tab by mouth daily.   Yes Historical Provider   CALCIUM-MAGNESIUM-VITAMIN D2 PO Take 1 Packet by mouth daily.  Yes Historical Provider     Allergies   Allergen Reactions   ??? Amlodipine Other (comments)     Loss of memory,nervous,jettery   ??? Losartan Other (comments)     Nervous,jettery   ??? Nifedipine Other (comments)     Swelling,palpations      Family History   Problem Relation Age of Onset   ??? Heart Disease Mother    ??? Heart Disease Father          Social history  Patient resides  x  Independently                Ambulates  x  Independently                 Alcohol history   x  None           Smoking history  x  None             History   Smoking status   ??? Never Smoker    Smokeless tobacco   ??? Never Used       Code status  x  Full code         Review of systems  I performed a comprehensive 12 systems review; pertinent positives were as  noted in HPI, otherwise negative.    Physical Examination   BP 128/76 mmHg   Pulse 68   Temp(Src) 97.9 ??F (36.6 ??C)   Resp 14   Ht 5' 7"  (1.702 m)   Wt 69.4 kg (153 lb)   BMI 23.96 kg/m2   SpO2 100%       O2 Device: Room air    General:  Patient in no acute respiratory distress   Head:  Normocephalic, without obvious abnormality, atraumatic   Eyes:  Conjunctivae/corneas clear. PERRL, EOMs intact   E/N/M/T: Nares normal. Septum midline. No nasal drainage or sinus tenderness  Lips, mucosa, and tongue normal   Teeth and gums normal  Clear oropharynx   Neck: Normal appearance and movements, symmetrical, trachea midline  No palpable adenopathy  No thyroid enlargement, tenderness or nodules  No carotid bruit   Normal JVD   Lungs:   Symmetrical chest expansion and respiratory effort  Clear to auscultation bilaterally   Chest wall:  No tenderness or deformity   Heart:  Regular rate and rhythm   Sounds normal; no murmur, click, rub or gallop   Abdomen:   Soft, no tenderness  Bowel sounds normal  No masses or hepatosplenomegaly  No hernias present   Back: No CVA tenderness   Extremities: Extremities normal, atraumatic  No cyanosis, clubbing or edema  Left calf tenderness    Pulses 2+ and symmetric all extremities   Skin: No rashes or ulcers  Warm/ dry   Musculo-      skeletal: Gait not tested  Normal symmetry, ROM, strength and tone     Neuro: GCS 15.  Moves all extremities x 4.  Generalized weakness.  No slurred speech.  No facial droop.  Sensation grossly intact.   Psych: Alert, oriented x 3  Flat effect         Data Review    24 Hour Results:  Recent Results (from the past 24 hour(s))   EKG, 12 LEAD, INITIAL    Collection Time: 09/06/14  2:43 PM   Result Value Ref Range    Ventricular Rate 65 BPM    Atrial Rate 65 BPM    P-R Interval 138 ms  QRS Duration 80 ms    Q-T Interval 352 ms    QTC Calculation (Bezet) 366 ms    Calculated P Axis 65 degrees    Calculated R Axis 23 degrees    Calculated T Axis 26 degrees     Diagnosis       Normal sinus rhythm  Nonspecific T wave abnormality  Abnormal ECG  When compared with ECG of 16-Feb-2013 20:11,  No significant change was found     EKG, 12 LEAD, INITIAL    Collection Time: 09/06/14  4:55 PM   Result Value Ref Range    Ventricular Rate 59 BPM    Atrial Rate 59 BPM    P-R Interval 146 ms    QRS Duration 82 ms    Q-T Interval 362 ms    QTC Calculation (Bezet) 358 ms    Calculated P Axis 60 degrees    Calculated R Axis 38 degrees    Calculated T Axis 20 degrees    Diagnosis       Sinus bradycardia  Nonspecific T wave abnormality  When compared with ECG of 06-Sep-2014 14:43,  No significant change was found  Confirmed by Shawnie Pons, M.D., Annie Main [phone]) on 09/06/2014 5:35:39 PM     CBC WITH AUTOMATED DIFF    Collection Time: 09/06/14  5:07 PM   Result Value Ref Range    WBC 8.4 3.6 - 11.0 K/uL    RBC 5.04 3.80 - 5.20 M/uL    HGB 16.2 (H) 11.5 - 16.0 g/dL    HCT 47.4 (H) 35.0 - 47.0 %    MCV 94.0 80.0 - 99.0 FL    MCH 32.1 26.0 - 34.0 PG    MCHC 34.2 30.0 - 36.5 g/dL    RDW 14.2 11.5 - 14.5 %    PLATELET 444 (H) 150 - 400 K/uL    NEUTROPHILS 58 32 - 75 %    LYMPHOCYTES 37 12 - 49 %    MONOCYTES 3 (L) 5 - 13 %    EOSINOPHILS 2 0 - 7 %    BASOPHILS 0 0 - 1 %    ABS. NEUTROPHILS 4.8 1.8 - 8.0 K/UL    ABS. LYMPHOCYTES 3.1 0.8 - 3.5 K/UL    ABS. MONOCYTES 0.3 0.0 - 1.0 K/UL    ABS. EOSINOPHILS 0.2 0.0 - 0.4 K/UL    ABS. BASOPHILS 0.0 0.0 - 0.1 K/UL   TROPONIN I    Collection Time: 09/06/14  5:57 PM   Result Value Ref Range    Troponin-I, Qt. <0.04 <2.53 ng/mL   METABOLIC PANEL, COMPREHENSIVE    Collection Time: 09/06/14  5:57 PM   Result Value Ref Range    Sodium 140 136 - 145 mmol/L    Potassium 3.7 3.5 - 5.1 mmol/L    Chloride 113 (H) 97 - 108 mmol/L    CO2 23 21 - 32 mmol/L    Anion gap 4 (L) 5 - 15 mmol/L    Glucose 74 65 - 100 mg/dL    BUN 14 6 - 20 MG/DL    Creatinine 0.98 0.55 - 1.02 MG/DL    BUN/Creatinine ratio 14 12 - 20      GFR est AA >60 >60 ml/min/1.74m     GFR est non-AA 59 (L) >60 ml/min/1.725m   Calcium 7.4 (L) 8.5 - 10.1 MG/DL    Bilirubin, total 0.2 0.2 - 1.0 MG/DL    ALT 28 12 - 78 U/L    AST  31 15 - 37 U/L    Alk. phosphatase 65 45 - 117 U/L    Protein, total 6.4 6.4 - 8.2 g/dL    Albumin 3.1 (L) 3.5 - 5.0 g/dL    Globulin 3.3 2.0 - 4.0 g/dL    A-G Ratio 0.9 (L) 1.1 - 2.2     CK W/ CKMB & INDEX    Collection Time: 09/06/14  5:57 PM   Result Value Ref Range    CK 106 26 - 192 U/L    CK - MB <0.5 (L) 0.5 - 3.6 NG/ML    CK-MB Index CANNOT BE CALCULATED 0 - 2.5     D DIMER    Collection Time: 09/06/14  8:08 PM   Result Value Ref Range    D-dimer 0.96 (H) 0.00 - 0.65 mg/L FEU     Recent Labs      09/06/14   1707   WBC  8.4   HGB  16.2*   HCT  47.4*   PLT  444*     Recent Labs      09/06/14   1757   NA  140   K  3.7   CL  113*   CO2  23   GLU  74   BUN  14   CREA  0.98   CA  7.4*   ALB  3.1*   TBILI  0.2   SGOT  31   ALT  28       Imaging  Chest xray portable:  FINDINGS: PA and lateral views of the chest demonstrate a stable   cardiomediastinal silhouette and clear lungs bilaterally. The visualized   osseous structures are unremarkable.  IMPRESSION: No acute process      Assessment and Plan   1.  Acute chest pain.  Place on observation with telemetry monitoring.  Consult with cardiologist in the a.m.  Order serial troponin levels.  Monitor closely.  May have Morphine, oxygen, Nitroglycerin, and Aspirin therapies.  Check lipid panel.  Plan for stress test.  2.  Malignant hypertension.   Order Nitroglycerin 2% ointment prn for HTN and chest pain.  3.  Dizziness. Place on fall precautions.  4. Depression.  Resume home meds.  5.  Hypocalcemia.  Order calcium gluconate 1 gram IV x 1 dose.  Repeat ionized calcium, serum calcium and albumin levels in the a.m.  6.  Right leg/ calf pain/ numbness.  Chronic.  No acute neurological focal deficits on exam.  Order venous duplex LE to rule out DVT.  7.  VTE prophylaxis.  Lovenox 40 mg sq q 24 hours.              Signed by: Marigene Ehlers, MD    September 06, 2014 at 9:01 PM

## 2014-09-06 NOTE — Other (Signed)
TRANSFER - OUT REPORT:    Verbal report given to Eber Jonesarolyn, RN (name) on Vanessa [name]  being transferred to PTU (unit) for routine progression of care       Report consisted of patient???s Situation, Background, Assessment and   Recommendations(SBAR).     Information from the following report(s) SBAR, ED Summary, MAR, Recent Results and Cardiac Rhythm NSR was reviewed with the receiving nurse.    Lines:   Peripheral IV 09/06/14 Right Antecubital (Active)   Site Assessment Clean, dry, & intact 09/06/2014  5:13 PM   Phlebitis Assessment 0 09/06/2014  5:13 PM   Infiltration Assessment 0 09/06/2014  5:13 PM   Dressing Status Clean, dry, & intact 09/06/2014  5:13 PM   Dressing Type Transparent 09/06/2014  5:13 PM   Hub Color/Line Status Blue;Patent;Flushed 09/06/2014  5:13 PM   Action Taken Blood drawn 09/06/2014  5:13 PM        Opportunity for questions and clarification was provided.      Patient transported with:   Monitor  Tech

## 2014-09-06 NOTE — Progress Notes (Signed)
TRANSFER - IN REPORT:    Verbal report received from Kaithlyn(name) on Felissa M Lanese  being received from ER(unit) for routine progression of care      Report consisted of patient???s Situation, Background, Assessment and   Recommendations(SBAR).     Information from the following report(s) SBAR, Kardex, ED Summary, Intake/Output, MAR, Recent Results and Cardiac Rhythm Sinus was reviewed with the receiving nurse.    Opportunity for questions and clarification was provided.      Assessment completed upon patient???s arrival to unit and care assumed.       2235 Arrived on unit via stretcher accompanied by nurse. Oriented to room and care plan reviewed. Up to bathroom, steady gait. Complains of pain 3/10 ambulated to bed without difficulty, resting quietly.

## 2014-09-06 NOTE — ED Notes (Signed)
Pt resting quietly.

## 2014-09-07 LAB — D-DIMER, QUANTITATIVE: D-Dimer, Quant: 0.96 mg/L FEU — ABNORMAL HIGH (ref 0.00–0.65)

## 2014-09-07 LAB — POC EG7
Base excess (POC): 2 mmol/L
Base excess (POC): 2 mmol/L
Calcium, ionized (POC): 1.3 mmol/L (ref 1.12–1.32)
Calcium, ionized (POC): 1.31 mmol/L (ref 1.12–1.32)
FIO2 (POC): 21 %
FIO2 (POC): 0.21 %
HCO3 (POC): 27.1 MMOL/L — ABNORMAL HIGH (ref 22–26)
HCO3 (POC): 26.3 MMOL/L — ABNORMAL HIGH (ref 22–26)
Total resp. rate: 16
Total resp. rate: 16
pCO2 (POC): 44.3 MMHG (ref 35.0–45.0)
pCO2 (POC): 40.4 MMHG (ref 35.0–45.0)
pH (POC): 7.395 (ref 7.35–7.45)
pH (POC): 7.421 (ref 7.35–7.45)
pO2 (POC): 50 MMHG — ABNORMAL LOW (ref 80–100)
pO2 (POC): 86 MMHG (ref 80–100)
sO2 (POC): 85 % — ABNORMAL LOW (ref 92–97)
sO2 (POC): 97 % (ref 92–97)

## 2014-09-07 LAB — STRESS TEST LEXISCAN
ECG Interp. Before Exercise: NORMAL
Max. Diastolic BP: 84 mmHg
Max. Heart rate: 90 {beats}/min
Max. Systolic BP: 140 mmHg
Peak Ex METs: 1 METS

## 2014-09-07 LAB — URINALYSIS W/ REFLEX CULTURE
Bacteria: NEGATIVE /hpf
Bilirubin: NEGATIVE
Blood: NEGATIVE
Glucose: NEGATIVE mg/dL
Ketone: NEGATIVE mg/dL
Leukocyte Esterase: NEGATIVE
Nitrites: NEGATIVE
Protein: NEGATIVE mg/dL
Specific gravity: 1.014 (ref 1.003–1.030)
Urobilinogen: 0.2 EU/dL (ref 0.2–1.0)
pH (UA): 6.5 (ref 5.0–8.0)

## 2014-09-07 LAB — PTT: aPTT: 30.8 s (ref 22.1–32.5)

## 2014-09-07 LAB — CK W/ REFLX CKMB: CK: 86 U/L (ref 26–192)

## 2014-09-07 LAB — EKG, 12 LEAD, INITIAL
Atrial Rate: 65 {beats}/min
Calculated P Axis: 65 degrees
Calculated R Axis: 23 degrees
Calculated T Axis: 26 degrees
Diagnosis: NORMAL
P-R Interval: 138 ms
Q-T Interval: 352 ms
QRS Duration: 80 ms
QTC Calculation (Bezet): 366 ms
Ventricular Rate: 65 {beats}/min

## 2014-09-07 LAB — PROTHROMBIN TIME + INR
INR: 1 (ref 0.9–1.1)
Prothrombin time: 10.5 s (ref 9.0–11.1)

## 2014-09-07 LAB — TROPONIN I
Troponin-I, Qt.: <0.04 ng/mL (ref <0.05)
Troponin-I, Qt.: <0.04 ng/mL (ref <0.05)

## 2014-09-07 LAB — D DIMER: D-dimer: 0.96 mg/L FEU — ABNORMAL HIGH (ref 0.00–0.65)

## 2014-09-07 LAB — ALBUMIN: Albumin: 3.6 g/dL (ref 3.5–5.0)

## 2014-09-07 LAB — CK: CK: 86 U/L (ref 26–192)

## 2014-09-07 MED ORDER — ASPIRIN 81 MG TAB, DELAYED RELEASE
81 mg | Freq: Every day | ORAL | Status: DC
Start: 2014-09-07 — End: 2014-09-06

## 2014-09-07 MED ORDER — HYDROCHLOROTHIAZIDE 25 MG TAB
25 mg | Freq: Every day | ORAL | Status: DC
Start: 2014-09-07 — End: 2014-09-07
  Administered 2014-09-07: 14:00:00 via ORAL

## 2014-09-07 MED ORDER — SODIUM CHLORIDE 0.9 % IV
10010 mg/mL (10%) | Freq: Once | INTRAVENOUS | Status: AC
Start: 2014-09-07 — End: 2014-09-07
  Administered 2014-09-07: 06:00:00 via INTRAVENOUS

## 2014-09-07 MED ORDER — NITROGLYCERIN 2 % TRANSDERMAL OINTMENT
2 % | Freq: Four times a day (QID) | TRANSDERMAL | Status: DC | PRN
Start: 2014-09-07 — End: 2014-09-07

## 2014-09-07 MED ORDER — ISOSORBIDE MONONITRATE SR 30 MG 24 HR TAB
30 mg | ORAL_TABLET | Freq: Every day | ORAL | Status: DC
Start: 2014-09-07 — End: 2014-09-11

## 2014-09-07 MED ORDER — DULOXETINE 30 MG CAP, DELAYED RELEASE
30 mg | Freq: Every day | ORAL | Status: DC
Start: 2014-09-07 — End: 2014-09-07
  Administered 2014-09-07: 14:00:00 via ORAL

## 2014-09-07 MED ORDER — SODIUM CHLORIDE 0.9 % IJ SYRG
INTRAMUSCULAR | Status: DC | PRN
Start: 2014-09-07 — End: 2014-09-07

## 2014-09-07 MED ORDER — B COMPLEX-VITAMIN C-FOLIC ACID 1 MG CAP
1 mg | Freq: Every day | ORAL | Status: DC
Start: 2014-09-07 — End: 2014-09-07
  Administered 2014-09-07: 14:00:00 via ORAL

## 2014-09-07 MED ORDER — PNEUMOCOCCAL 23-VALPS VACCINE 25 MCG/0.5 ML INJECTION
25 mcg/0.5 mL | INTRAMUSCULAR | Status: AC
Start: 2014-09-07 — End: 2014-09-07
  Administered 2014-09-07: via INTRAMUSCULAR

## 2014-09-07 MED ORDER — BUSPIRONE 10 MG TAB
10 mg | Freq: Two times a day (BID) | ORAL | Status: DC
Start: 2014-09-07 — End: 2014-09-07
  Administered 2014-09-07 (×2): via ORAL

## 2014-09-07 MED ORDER — SODIUM CHLORIDE 0.9 % IJ SYRG
Freq: Three times a day (TID) | INTRAMUSCULAR | Status: DC
Start: 2014-09-07 — End: 2014-09-07
  Administered 2014-09-07 (×3): via INTRAVENOUS

## 2014-09-07 MED ORDER — REGADENOSON 0.4 MG/5 ML IV SYRINGE
0.4 mg/5 mL | Freq: Once | INTRAVENOUS | Status: AC
Start: 2014-09-07 — End: 2014-09-07
  Administered 2014-09-07: 16:00:00 via INTRAVENOUS

## 2014-09-07 MED ORDER — VALSARTAN 80 MG TAB
80 mg | Freq: Every day | ORAL | Status: DC
Start: 2014-09-07 — End: 2014-09-07
  Administered 2014-09-07: 14:00:00 via ORAL

## 2014-09-07 MED ORDER — ASPIRIN 81 MG CHEWABLE TAB
81 mg | ORAL | Status: AC
Start: 2014-09-07 — End: 2014-09-06
  Administered 2014-09-07: 01:00:00 via ORAL

## 2014-09-07 MED ORDER — NITROGLYCERIN 0.4 MG SUBLINGUAL TAB
0.4 mg | SUBLINGUAL | Status: DC | PRN
Start: 2014-09-07 — End: 2014-09-07
  Administered 2014-09-07 (×2): via SUBLINGUAL

## 2014-09-07 MED ORDER — SALINE PERIPHERAL FLUSH PRN
Freq: Once | INTRAMUSCULAR | Status: AC
Start: 2014-09-07 — End: 2014-09-07
  Administered 2014-09-07: 16:00:00

## 2014-09-07 MED ORDER — ASPIRIN 81 MG TAB, DELAYED RELEASE
81 mg | Freq: Every day | ORAL | Status: DC
Start: 2014-09-07 — End: 2014-09-07
  Administered 2014-09-07: 14:00:00 via ORAL

## 2014-09-07 MED ORDER — VALSARTAN-HYDROCHLOROTHIAZIDE 160 MG-12.5 MG TAB
Freq: Every day | ORAL | Status: DC
Start: 2014-09-07 — End: 2014-09-06

## 2014-09-07 MED FILL — NEPHROCAPS 1 MG CAPSULE: 1 mg | ORAL | Qty: 1

## 2014-09-07 MED FILL — BUSPIRONE 10 MG TAB: 10 mg | ORAL | Qty: 3

## 2014-09-07 MED FILL — BD POSIFLUSH NORMAL SALINE 0.9 % INJECTION SYRINGE: INTRAMUSCULAR | Qty: 20

## 2014-09-07 MED FILL — BD POSIFLUSH NORMAL SALINE 0.9 % INJECTION SYRINGE: INTRAMUSCULAR | Qty: 10

## 2014-09-07 MED FILL — CALCIUM GLUCONATE 100 MG/ML (10%) IV SOLN: 100 mg/mL (10%) | INTRAVENOUS | Qty: 10

## 2014-09-07 MED FILL — BAYER CHEWABLE LOW DOSE ASPIRIN 81 MG TABLET: 81 mg | ORAL | Qty: 4

## 2014-09-07 MED FILL — DIOVAN 80 MG TABLET: 80 mg | ORAL | Qty: 2

## 2014-09-07 MED FILL — HYDROCHLOROTHIAZIDE 25 MG TAB: 25 mg | ORAL | Qty: 1

## 2014-09-07 MED FILL — NITROGLYCERIN 0.4 MG SUBLINGUAL TAB: 0.4 mg | SUBLINGUAL | Qty: 1

## 2014-09-07 MED FILL — BAYER LOW DOSE ASPIRIN 81 MG TABLET,DELAYED RELEASE: 81 mg | ORAL | Qty: 1

## 2014-09-07 MED FILL — LEXISCAN 0.4 MG/5 ML INTRAVENOUS SYRINGE: 0.4 mg/5 mL | INTRAVENOUS | Qty: 5

## 2014-09-07 MED FILL — DULOXETINE 30 MG CAP, DELAYED RELEASE: 30 mg | ORAL | Qty: 1

## 2014-09-07 NOTE — Procedures (Signed)
[facility]  *** FINAL REPORT ***    Name: Vanessa [name], Vanessa [name]  MRN: [id number]    Outpatient  DOB: 09 Oct 1956  HIS Order #: [id number]  TRAKnet Visit #: [id number]  Date: 06 Sep 2014    TYPE OF TEST: Peripheral Venous Testing    REASON FOR TEST  Pain in limb    Right Leg:-  Deep venous thrombosis:           No  Superficial venous thrombosis:    No  Deep venous insufficiency:        Not examined  Superficial venous insufficiency: Not examined    Left Leg:-  Deep venous thrombosis:           No  Superficial venous thrombosis:    No  Deep venous insufficiency:        Not examined  Superficial venous insufficiency: Not examined      INTERPRETATION/FINDINGS  PROCEDURE:  Color duplex ultrasound imaging of lower extremity veins.    FINDINGS:       Right: The common femoral, deep femoral, femoral, popliteal,  posterior tibial, peroneal, and great saphenous are visualized; each  is compressible and no narrowing of the flow channel on color Doppler  imaging is observed.  Phasic flow is observed in the common femoral  vein.       Left:   The common femoral, deep femoral, femoral, popliteal,  posterior tibial, peroneal, and great saphenous are visualized; each  is compressible and no narrowing of the flow channel on color Doppler  imaging is observed.  Phasic flow is observed in the common femoral  vein.    IMPRESSION:  No evidence of right or left lower extremity vein  thrombosis.    ADDITIONAL COMMENTS    I have personally reviewed the data relevant to the interpretation of  this  study.    TECHNOLOGIST: Wilmer FloorLisa A. Perrino, RVT, RDMS  Signed: 09/06/2014 10:29 PM    PHYSICIAN: Lenn SinkAvik Tessy Pawelski, MD  Signed: 09/07/2014 04:03 PM

## 2014-09-07 NOTE — Progress Notes (Signed)
[facility]       September 07, 2014       RE: Vanessa Robertngela M Runion      To Whom It May Concern,    This is to certify that Vanessa [name] may return to work on September 11, 2014 .    Please feel free to contact my office if you have any questions or concerns.  Thank you for your assistance in this matter.      Sincerely,  Jamesetta SoKatie C Jones, RN

## 2014-09-07 NOTE — Other (Signed)
Chart reviewed for medical necessity and discharge planning needs.  Care Management available to assist with transitions of care.  SB Gilsdorf, RN CRM

## 2014-09-07 NOTE — Other (Signed)
IDR/SLIDR Summary          Patient: Smith Robertngela M Lile MRN: [id number]    Age: 58 y.o.     Birthdate: 11/07/1955 Room/Bed: 405/01   Admit Diagnosis: acute chest pain  Principal Diagnosis: Acute chest pain   Goals: Cardiac Evaluation   Readmission: NO  Quality Measure: Not applicable  VTE Prophylaxis: Not needed  Influenza Vaccine screening completed? YES  Pneumococcal Vaccine screening completed? YES  Mobility needs: No   Nutrition plan:Yes, NPO for testing  Consults:None    Financial concerns:No  Escalated to CM? NO  RRAT Score: 15   Interventions:  Testing due for pt today? YES 2D Echo, Stress Test  LOS: 1 days Expected length of stay 0 days  Discharge plan: Home   PCP: Cristine PolioODERICK E HAITHCOCK, MD  Transportation needs: No    Days before discharge:ready for discharge  Discharge disposition: Home    Signed:     Wynelle ClevelandCarolyn M. Lebron QuamSeaborne, RN  09/07/2014  6:04 AM

## 2014-09-07 NOTE — Progress Notes (Signed)
I have reviewed discharge instructions with the patient and spouse.  The patient and spouse verbalized understanding. Opportunity for questions given. RN removed patient PIV and telemetry monitoring. Medication education given and reviewed with patient regarding new medication at discharge. Follow up with PCP to be made by patient in 1 week. PNA vaccine to be given prior to discharge.

## 2014-09-07 NOTE — Consults (Signed)
Name:       Vanessa [name], Vanessa [name]            Admitted:          09/06/2014                                         DOB:               1956-05-22  Account #:  [id number]               Age:               58  Consultant: Bryon LionsStephen N Derion Kreiter, MD     Location                                CONSULTATION REPORT    DATE OF CONSULTATION      REFERRING PHYSICIAN: Carol Adaoderick Haithcock, MD    HISTORY OF PRESENT ILLNESS: This is 58 year old woman admitted with chest  pain. Per evaluation, she had an episode of mid sternal heaviness in her  chest that lasted for 45 minutes to an hour. She was sent here and it was  finally relieved with sublingual nitroglycerin and aspirin. She has had no  recurrence. She was admitted for observation and has already had an  echocardiogram and a stress test.    PAST MEDICAL HISTORY: Otherwise, fairly unremarkable. She has hypertension  as the only other significant medical problem and depression.    CURRENT MEDICATIONS AT HOME  Include the following:  1. Minoxidil 2% solution for hair.  2. Prometrium 100 mg.  3. Calcium.  4. Magnesium.  5. Vitamin B2 p.o., 1 daily.  6. Cymbalta 30 mg daily.  7. Valsartan/hydrochlorothiazide 160/25 daily.  8. Buspirone 20 mg.  9. Vitamin B12 complex once a day.    ALLERGIES  The patient lists allergies to:  1. AMLODIPINE.  2. LOSARTAN.  3. NIFEDIPINE.    REVIEW OF SYSTEMS: Negative for shortness of breath with ordinary effort.  Negative for orthopnea, paroxysmal nocturnal dyspnea, diaphoresis, syncope,  palpitations, unexplained weight loss.    PAST SURGICAL HISTORY: Tubal ligation and toe surgery.    SOCIAL HISTORY: The patient is married, lives with husband. She has 3  children. She works at a bank.    PHYSICAL EXAMINATION  GENERAL: This is a well-developed, pleasant, with a somewhat flat affect.  Very cooperative, no distress.  VITAL SIGNS: Blood pressure is 126/84, respirations 16, pulse 64,  temperature 97.6. Saturations 100%.  HEENT: Unremarkable.   NECK: Supple. No adenopathy. Carotid pulses palpable. No bruit on  auscultation. Trachea midline.  CHEST: Nontender.  LUNGS: Clear to auscultation and percussion.  HEART: Regular, moderate rhythm. Normal S1, normal S2. Moderate S4, no S3,  no friction rub, no neck vein distention, no hepatojugular reflux. No  thrills, lifts or heaves.  EXTREMITIES: No edema. Normal pulses.  ABDOMEN: Soft, nontender, no bruits, no ascites, no palpable masses.  NEUROLOGIC: The patient is awake, alert, appropriate. Normal speech. No  focal motor signs. Normal motor tone. EXTREMITIES: No edema. Normal  pulses.    The patient's chest x-ray was normal. This was personally reviewed.    The patient's echocardiogram was normal. This was personally reviewed.    The patient's nuclear stress test was normal. This was personally  reviewed.  The EKG from admission demonstrated sinus rhythm with  nondiagnostic ST-T  changes.    Labs were unremarkable with a hemoglobin of 16.2, hematocrit 47.4, platelet  count 425,000.    IMPRESSION: This patient has atypical chest pain. It seemed to respond to  sublingual nitroglycerin. Her workup for ischemic heart disease was  unrevealing and negative. This might have been a case of esophageal spasm.    RECOMMENDATIONS: Would discharge home at your discretion with sublingual  nitrates. Consider adding a calcium channel blocker to this, but we will  start with sublingual nitrates, which the esophagus responds on a very reliable  basis. Similarly, on an outpatient basis, the patient should have a GI  evaluation if this has not yet recently been undertaken.    Thank you for this referral.                Bryon LionsStephen N Briony Parveen, MD    cc:                       Bryon LionsStephen N Jaunita Mikels, MD      SNA/wmx; D: 09/07/2014 04:01 P; T: 09/07/2014 05:01 P; DOC# [id number]; Job#  [id number]

## 2014-09-07 NOTE — Procedures (Signed)
[facility]  *** FINAL REPORT ***    Name: [name], Vanessa  MRN: [id number]    Outpatient  DOB: 09 Oct 1956  HIS Order #: [id number]  TRAKnet Visit #: [id number]  Date: 06 Sep 2014    TYPE OF TEST: Peripheral Venous Testing    REASON FOR TEST  Pain in limb    Right Leg:-  Deep venous thrombosis:           No  Superficial venous thrombosis:    No  Deep venous insufficiency:        Not examined  Superficial venous insufficiency: Not examined    Left Leg:-  Deep venous thrombosis:           No  Superficial venous thrombosis:    No  Deep venous insufficiency:        Not examined  Superficial venous insufficiency: Not examined      INTERPRETATION/FINDINGS  PROCEDURE:  Color duplex ultrasound imaging of lower extremity veins.    FINDINGS:       Right: The common femoral, deep femoral, femoral, popliteal,  posterior tibial, peroneal, and great saphenous are visualized; each  is compressible and no narrowing of the flow channel on color Doppler  imaging is observed.  Phasic flow is observed in the common femoral  vein.       Left:   The common femoral, deep femoral, femoral, popliteal,  posterior tibial, peroneal, and great saphenous are visualized; each  is compressible and no narrowing of the flow channel on color Doppler  imaging is observed.  Phasic flow is observed in the common femoral  vein.    IMPRESSION:  No evidence of right or left lower extremity vein  thrombosis.    ADDITIONAL COMMENTS    I have personally reviewed the data relevant to the interpretation of  this  study.    TECHNOLOGIST: Lisa A. Perrino, RVT, RDMS  Signed: 09/06/2014 10:29 PM    PHYSICIAN: Zamauri Nez, MD  Signed: 09/07/2014 04:03 PM

## 2014-09-07 NOTE — Progress Notes (Addendum)
Cm reviewed chart and noted patient was admitted due to chest pain under observation status.  Patient has hx of hypertension, depression and diverticulosis.  PCP is Dr Waldemar Dickens.  She is seen in his office and yesterday she was advised to go to [facility] by his office.  She was seen and sent to ER.  Secures medications at [facility] on [address] [facility].     Cm met with patient briefly in her room as patient was being taken to have stress test.  Patient confirmed the above.  She was independent and self care prior to admission.  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  She lives with her husband Gwyndolyn Saxon [phone] in their home.  Her sister Allena Earing [phone] is a second emergency contact.      Cm explained the Outpatient/observation letter to her and she understood.  She signed the letter and a copy given to her.  The original placed in chart.      Patient disposition needs not determined as patient is having two tests today.  Cm will follow for potential needs and assist.  Patient expects to be discharged home with family transporting.         Care Management Interventions  PCP Verified by CM?: Yes (patient - Dr Waldemar Dickens)  Palliative Care Consult: No  Mode of Transport at Discharge:  (family  car)  Care Management Consult: Yes, No  Physical Therapy Consult: No  Occupational Therapy Consult: No  Current Support Network: Lives with Spouse (lives with husband in home-- self care and independent prior to admission)  Confirm Follow Up Transport:  (self, family)  Plan discussed with Pt/Family/Caregiver: Yes  Discharge Location  Discharge Placement:  (TBD--stress test and echo today.Marland Kitchen)

## 2014-09-07 NOTE — Progress Notes (Signed)
Hospital Progress Note    NAME:  Vanessa [name]   DOB:   02/26/1956   MRN:  [id number]     Date/Time:  09/07/2014 8:14 AM    Plan:   1. Card opinion  2. Home soon  Risk of Deterioration: Low             Moderate             High                   Assessment:   Principal Problem:    Acute chest pain (09/06/2014) resolved    Active Problems:    Numbness and tingling (09/07/2014)      HTN (hypertension) (09/07/2014) titrate meds       Subjective:     feeling better   11 Point Review of Systems:   Negative except no further cp/sob                Unable to obtain ROS due to:                   mental status change             sedated             intubated     History   Substance Use Topics   ??? Smoking status: Never Smoker    ??? Smokeless tobacco: Never Used   ??? Alcohol Use: No     Medications reviewed:  Current Facility-Administered Medications   Medication Dose Route Frequency   ??? regadenoson (LEXISCAN) injection 0.4 mg  0.4 mg IntraVENous RAD ONCE   ??? saline peripheral flush soln 20 mL  20 mL InterCATHeter RAD ONCE   ??? nitroglycerin (NITROSTAT) tablet 0.4 mg  0.4 mg SubLINGual Q5MIN PRN   ??? sodium chloride (NS) flush 5-10 mL  5-10 mL IntraVENous Q8H   ??? sodium chloride (NS) flush 5-10 mL  5-10 mL IntraVENous PRN   ??? aspirin delayed-release tablet 81 mg  81 mg Oral DAILY   ??? nitroglycerin (NITROBID) 2 % ointment 1 Inch  1 Inch Topical Q6H PRN   ??? busPIRone (BUSPAR) tablet 30 mg  30 mg Oral BID   ??? DULoxetine (CYMBALTA) capsule 30 mg  30 mg Oral DAILY   ??? B complex-vitaminC-folic acid (NEPHROCAP) cap  1 Cap Oral DAILY   ??? valsartan (DIOVAN) tablet 160 mg  160 mg Oral DAILY   ??? hydrochlorothiazide (HYDRODIURIL) tablet 12.5 mg  12.5 mg Oral DAILY        Objective:   Vitals:  BP 123/85 mmHg   Pulse 78   Temp(Src) 98 ??F (36.7 ??C)   Resp 12   Ht 5\' 7"  (1.702 m)   Wt 150 lb 2.1 oz (68.1 kg)   BMI 23.51 kg/m2   SpO2 100%   Breastfeeding? No  Temp (24hrs), Avg:97.8 ??F (36.6 ??C), Min:97.5 ??F (36.4 ??C), Max:98 ??F (36.7 ??C)       O2 Device: Room air    Last 24hr Input/Output:    Intake/Output Summary (Last 24 hours) at 09/07/14 0814  Last data filed at 09/07/14 0400   Gross per 24 hour   Intake      0 ml   Output    450 ml   Net   -450 ml        PHYSICAL EXAM:  General:    Alert, cooperative, no distress, appears stated age.     Head:  Normocephalic, without obvious abnormality, atraumatic.  Eyes:   Conjunctivae/corneas clear.  PERRLA  Lungs:   Clear to auscultation bilaterally.  No Wheezing or Rhonchi. No rales.  Chest wall:  No tenderness or deformity. No Accessory muscle use.  Heart:   Regular rate and rhythm,  no murmur, rub or gallop.  Abdomen:   Soft, non-tender. Not distended.  Bowel sounds normal. No masses    Lab Data Reviewed:    Recent Labs      09/06/14   1707   WBC  8.4   HGB  16.2*   HCT  47.4*   PLT  444*     Recent Labs      09/07/14   0535  09/06/14   1757   NA   --   140   K   --   3.7   CL   --   113*   CO2   --   23   GLU   --   74   BUN   --   14   CREA   --   0.98   CA   --   7.4*   ALB  3.6  3.1*   TBILI   --   0.2   SGOT   --   31   ALT   --   28   INR  1.0   --        Recent Labs      09/07/14   0535   INR  1.0     ___________________________________________________  ___________________________________________________    Attending Physician: Cristine PolioODERICK E Sidney Silberman, MD

## 2014-09-07 NOTE — Other (Signed)
Cardiac Wellness: Stress Test brochure to the bedside of Vanessa [name]. Patient is currently off the floor. Will continue to follow for test results.

## 2014-09-07 NOTE — Consults (Signed)
Consult Note dictated # [id number]  Impressions:  1. Noncardiac chest pain most suggestive of esophageal spasm  2. Normal myocardial perfusion study; normal echo  3. Normal chest x ray    Recommendations:  1. Add long acting nitrates (the patient indicates that the SL ntg seemed to help)  2. Outpatient GI evaluation for reflux and for esophageal manometry  3. Discharge at your discretion    Thank you for your referral.    Vanessa PhilipsStephen Jagger Beahm, MD

## 2014-09-07 NOTE — Progress Notes (Signed)
Problem: Discharge Planning  Goal: *Discharge to safe environment  Outcome: Progressing Towards Goal  Discharge home with medical follow up.  Cm to follow for disposition needs.

## 2014-09-07 NOTE — Progress Notes (Signed)
Uneventful night, resting quietly with no further complaints. Hourly rounds. Will continue to monitor and document changes.    0800 Bedside shift change report given to Nicholos JohnsKathleen RN (oncoming nurse) by Wilford Gristarolyn RN (offgoing nurse). Report included the following information SBAR, Kardex, MAR, Accordion, Recent Results and Cardiac Rhythm sinus.

## 2014-09-08 NOTE — Discharge Summary (Signed)
Name:       Vanessa [name], Vanessa [name]                  Admitted:    09/06/2014                                               Discharged:  09/07/2014  Account #:  [id number]                     DOB:         03-06-1956  Consultant: Su Leyoderick Ervin Shaydon Lease, MD     Age          58                                 DISCHARGE SUMMARY      HISTORY: A 58 year old black female presented to the emergency room from  home with the chief complaint of chest pain. Symptom onset reportedly on  the day of admission, severe, constant, nonradiating chest pain. Located in  the substernal chest region on the chest, described as both sharp and  pressure-like, like someone was sitting on her chest. Aggravated by deep  breathing without specific aggravating factors. The patient reported  similar symptoms for the past 2 years. Had a nuclear stress test on  02/10/2012 which showed no evidence of myocardial ischemia at rest with  normal wall motion, ejection fraction of 56%. The patient was seen at the  [facility] and then she was referred to the ED. The patient  subsequently admitted by the hospitalist for evaluation and care.    PAST MEDICAL HISTORY/REVIEW OF SYSTEMS/PHYSICAL EXAMINATION: Noted in the  HPI.    CONSULTATIONS: Dr. Alvira PhilipsStephen Abramson, Cardiology. Impression: The patient  has atypical chest pain and seemed to respond to sublingual nitroglycerin.  Her workup for ischemic heart disease was unrevealing and negative. This  may be a case of the esophageal spasm. Recommendation: Would discharge home  at your discretion with sublingual nitrates. Consider adding long-acting  channel blocker to this, but we will start with sublingual which esophagus  response is on a very reliable basis. on outpatient taking a GI  evaluation if this has not been recently undertaken.    RESULTS REVIEW: Hemoglobin 15.2, hematocrit 47.4, WBC 8.4. Urinalysis  unremarkable. Sodium 140, potassium 3.7, chloride 113, CO2 of 23, glucose   74, BUN 14, creatinine 0.9. Metabolic panel otherwise unremarkable. Cardiac  enzymes were negative.    Doppler exam was negative. Nuclear myocardial perfusion gated wall motion  imaging, no ischemia demonstrated. No bile visible. No infarct visible.  Ejection fraction 66%.    HOSPITAL COURSE: As noted above, she was seen in consultation by Cardiology  for assistance with her care. She was placed on telemetry where she  remained in sinus rhythm. Nitroglycerin relieved the discomfort. She had no  further discomfort after admission. The cardiologist felt that the  discomfort is likely related to esophageal spasm and recommended nitrates  and suggested, since the sublingual nitroglycerin seemed to help, to add  long-acting nitrates to her current regime. He also suggested a GI  evaluation for reflux and for esophageal manometry.  Thus, at this point her medical status is stable. Chest pain is resolved,  found to be noncardiac origin. She  is being discharged home ambulatory, in  satisfactory condition, improved.    FINAL DIAGNOSES:  1. Noncardiac chest pain.  2. Esophageal spasm.  3. Primary hypertension.  4. Generalized anxiety disorder.  5. Hepatic steatosis.  6. Major depressive disorder.    DISPOSITION:  1. Discharged to home ambulatory.  2. Imdur 30 mg daily.  3. BuSpar 30 mg b.i.d.  4. Calcium, magnesium, and Vitamin D2 daily.  5. Cymbalta 30 mg daily.  6. Minoxidil 2% external solution to scalp.  7. Progesterone 100 mg daily.  8. Diovan/hydrochlorothiazide 160/12.5 daily.  9. Vayarin 1 capsule b.i.d.  10. Nephro-Vite daily.    FOLLOWUP: She will return to our office in 3-5 days for followup care.    ACTIVITIES: Up as tolerated.    DIET: Heart healthy diet.              Su Leyoderick Ervin Analei Whinery, MD    cc:    Su Leyoderick Ervin Loyal Holzheimer, MD      REH/wmx; D: 09/07/2014 06:23 P; T: 09/08/2014 01:12 P; DOC# [id number]; Job#  [id number]

## 2014-09-11 ENCOUNTER — Ambulatory Visit
Admit: 2014-09-11 | Discharge: 2014-09-11 | Payer: PRIVATE HEALTH INSURANCE | Attending: Internal Medicine | Primary: Family Medicine

## 2014-09-11 DIAGNOSIS — R0789 Other chest pain: Secondary | ICD-10-CM

## 2014-09-11 MED ORDER — OMEPRAZOLE 40 MG CAP, DELAYED RELEASE
40 mg | ORAL_CAPSULE | Freq: Every day | ORAL | Status: DC
Start: 2014-09-11 — End: 2015-03-13

## 2014-09-11 MED ORDER — NITROGLYCERIN 0.4 MG SUBLINGUAL TAB
0.4 mg | ORAL_TABLET | SUBLINGUAL | Status: DC | PRN
Start: 2014-09-11 — End: 2016-10-30

## 2014-09-11 NOTE — Progress Notes (Signed)
1. Have you been to the ER, urgent care clinic since your last visit?  Hospitalized since your last visit?Yes Where: [facility]    2. Have you seen or consulted any other health care providers outside of the [facility] since your last visit?  Include any pap smears or colon screening. No

## 2014-09-11 NOTE — Addendum Note (Signed)
Addended by: Cristine PolioHAITHCOCK, Briah Nary E on: 09/11/2014 02:11 PM      Modules accepted: Orders

## 2014-09-11 NOTE — Progress Notes (Signed)
[facility]  Carol Ada, MD, [place], CMD  [address]  [address] [place] [address]  Phone:  [phone]  Fax: [phone]       Chief Complaint   Patient presents with   ??? Hospital Follow Up   ??? Chest Pain   .      SUBJECTIVE:    Vanessa [name] is a 58 y.o. female Patient is alert and appropriate and has the capacity to give an accurate history.   Patient is seen today for transition of care services following admission on 11/4 and discharge on 11/5 for noncardiac chest pain that was felt maybe to be related to esophageal spasm.  During that admission she was seen in consultation by Lavinia Sharps who noted the chest pain was atypical and seemed to respond to sublingual nitroglycerin.  He is said to add a calcium channel blocker but would start with sublingual Nitroglycerin.  He noted the esophagus seemed to respond very reliably on that basis.  She had a nuclear medicine stress test during that admission that revealed no ischemia.  Demonstrated no infarct visible and ejection fraction of 66%.  Since she was discharged from the hospital she had an episode of chest pain for which we treated over the phone.  Patient tried to go to work this morning.  She works at [employer] at [employer] and [employer]une Pascal in [employer].  Since she left the hospital she has no energy.  She feels tired all throughout the esophagus she feels a pressure sensation.  Patient states she is not under any stress right now but is only stressing because of the way that she feels.  Patient is seen for evaluation.    She has just been hurting very bad.  She was in the bed Friday and went out a little bit Saturday but was in the bed all day Sunday because of the discomfort.             Current Outpatient Prescriptions   Medication Sig Dispense Refill   ??? nitroglycerin (NITROSTAT) 0.4 mg SL tablet 1 Tab by SubLINGual route every five (5) minutes as needed for Chest Pain. 30 Tab 2    ??? minoxidil (ROGAINE) 2 % external solution Apply  to affected area two (2) times a day. Apply to scalp and rub it in     ??? DULoxetine (CYMBALTA) 30 mg capsule Take 30 mg by mouth daily.     ??? VAYARIN 75-8.5-21.5 mg cap Take 1 Cap by mouth two (2) times a day.     ??? progesterone (PROMETRIUM) 100 mg capsule Take 100 mg by mouth daily.     ??? valsartan-hydrochlorothiazide (DIOVAN-HCT) 160-12.5 mg per tablet Take 1 Tab by mouth daily. 30 Tab 11   ??? busPIRone (BUSPAR) 30 mg tablet Take 1 Tab by mouth two (2) times a day. 60 Tab 1   ??? vit B Cmplx 3-FA-Vit C-Biotin (NEPHRO VITE RX) 1-60-300 mg-mg-mcg tablet Take 1 Tab by mouth daily.     ??? CALCIUM-MAGNESIUM-VITAMIN D2 PO Take 1 Packet by mouth daily.       Past Medical History   Diagnosis Date   ??? Hypertension    ??? Normal cardiac stress test 02-10-12   ??? Hepatic steatosis    ??? Depression    ??? H/O colonoscopy 09-24-12     diverticulosis   ??? Pneumonia      Past Surgical History   Procedure Laterality Date   ??? Hx  gyn       tubal ligation     Allergies   Allergen Reactions   ??? Amlodipine Other (comments)     Loss of memory,nervous,jettery   ??? Losartan Other (comments)     Nervous,jettery   ??? Nifedipine Other (comments)     Swelling,palpations         REVIEW OF SYSTEMS:  General: negative for - chills or fever  ENT: negative for - headaches, nasal congestion or tinnitus  Respiratory: negative for - cough, hemoptysis, shortness of breath or wheezing  Cardiovascular : negative for - chest pain, edema, palpitations or shortness of breath  Gastrointestinal: negative for - abdominal pain, blood in stools, heartburn or nausea/vomiting  Genito-Urinary: no dysuria, trouble voiding, or hematuria  Musculoskeletal: negative for - gait disturbance, joint pain, joint stiffness or joint swelling  Neurological: no TIA or stroke symptoms  Hematologic: no bruises, no bleeding, no swollen glands  Integument: no lumps, mole changes, nail changes or rash   Endocrine: no malaise/lethargy or unexpected weight changes      History     Social History   ??? Marital Status: MARRIED     Spouse Name: N/A     Number of Children: N/A   ??? Years of Education: N/A     Social History Main Topics   ??? Smoking status: Never Smoker    ??? Smokeless tobacco: Never Used   ??? Alcohol Use: No   ??? Drug Use: No   ??? Sexual Activity:     Partners: Male     Pharmacist, hospitalBirth Control/ Protection: None     Other Topics Concern   ??? Not on file     Social History Narrative     Family History   Problem Relation Age of Onset   ??? Heart Disease Mother    ??? Heart Disease Father        OBJECTIVE:    BP 150/82 mmHg   Pulse 98   Temp(Src) 97.9 ??F (36.6 ??C) (Oral)   Resp 18   Ht 5\' 7"  (1.702 m)   Wt 150 lb (68.04 kg)   BMI 23.49 kg/m2   SpO2 98%  CONSTITUTIONAL: well , well nourished, appears age appropriate  EYES: perrla, eom intact  ENMT:moist mucous membranes, pharynx clear  NECK: supple. Thyroid normal  RESPIRATORY: Chest: clear bilaterally   CARDIOVASCULAR: Heart: regular rate and rhythm  GASTROINTESTINAL: Abdomen: soft, bowel sounds active  HEMATOLOGIC: no pathological lymph nodes palpated  MUSCULOSKELETAL: Extremities: no edema, pulse 1+   INTEGUMENT: No unusual rashes or suspicious skin lesions noted. Nails appear normal.  NEUROLOGIC: non-focal exam   MENTAL STATUS: alert and oriented, appropriate affect      ASSESSMENT:  1. Other chest pain    2. Anxiety and depression    3. Essential hypertension    4. Normal cardiac stress test      Discomfort is felt to be related to esophageal spasm.  As suggested by Cardiology we would like to try a calcium channel blocker but unfortunately she had an allergic reaction to Nifedipine and therefore I am reluctant to use that drug.  We will stop the Indur and give her a short acting Nitrate, ie Nitroglycerin to see if we can make her more comfortable.  We will ask GI to see her for consideration of an EGD and/or appropriate studies to confirm or disallow esophageal spasm.  Blood  pressure is at the upper limits of normal today.  End of life issues are noted  above.  She will return to see us in about three or four weeks, sooner if she is not better.        PLAN:  .  Orders Placed This Encounter   ??? REFERRAL TO GASTROENTEROLOGY   ??? nitroglycerin (NITROSTAT) 0.4 mg SL tablet       Follow-up Disposition:  Return in about 4 weeks (around 10/09/2014).      ATTENTION:   This medical record was transcribed using an electronic medical records system.  Although proofread, it may and can contain electronic and spelling errors.  Other human spelling and other errors may be present.  Corrections may be executed at a later time.  Please feel free to contact us for any clarifications as needed.

## 2014-09-11 NOTE — Progress Notes (Signed)
Patient listed on discharge [facility] report on 09-10-14.  [facility].  Contacted patient to perform post hospital discharge assessment.  Verified DOB and address with patient as identifiers.  Provided introduction to self, and explanation of the NN role.  Performed medication reconciliation with patient, and patient verbalizes understanding of administration of home medications.  Reviewed discharge instructions with patient.  Patient verbalizes understand of discharge instructions and follow-up care. . No other clinical/social/functional needs noted. Patient given an opportunity to ask questions.  Contact information provided to the patient for future reference for follow up .  Patient referred to GI for follow up.  Advised pt referral coordinator will contact pt with appt date and time.

## 2014-09-12 ENCOUNTER — Other Ambulatory Visit: Payer: Self-pay | Admitting: Hematology and Oncology

## 2014-09-12 ENCOUNTER — Telehealth: Payer: Self-pay | Admitting: Hematology and Oncology

## 2014-09-12 NOTE — Telephone Encounter (Signed)
pt called to r/s appt..done...pt ok and awareof appts

## 2014-09-13 ENCOUNTER — Encounter: Attending: Internal Medicine | Primary: Family Medicine

## 2014-09-14 ENCOUNTER — Encounter: Payer: Self-pay | Admitting: Hematology and Oncology

## 2014-09-14 ENCOUNTER — Ambulatory Visit (HOSPITAL_BASED_OUTPATIENT_CLINIC_OR_DEPARTMENT_OTHER): Payer: Self-pay | Admitting: Hematology and Oncology

## 2014-09-14 VITALS — BP 130/72 | HR 105 | Temp 98.9°F | Resp 18 | Ht 62.0 in | Wt 195.7 lb

## 2014-09-14 DIAGNOSIS — I87009 Postthrombotic syndrome without complications of unspecified extremity: Secondary | ICD-10-CM

## 2014-09-14 DIAGNOSIS — I82409 Acute embolism and thrombosis of unspecified deep veins of unspecified lower extremity: Secondary | ICD-10-CM

## 2014-09-14 MED ORDER — XARELTO 20 MG PO TABS: 20.0000 mg | ORAL_TABLET | Freq: Every day | ORAL | Status: DC

## 2014-09-14 NOTE — Assessment & Plan Note (Signed)
She is doing with very well since her treatment was switched to Xarelto. She has no complications from treatment. Goal of treatment is lifelong. I will discharge her from this clinic and recommend PCP follow-up. While on Xarelto, she would need minimum twice a year history, physical examination and blood work.

## 2014-09-14 NOTE — Progress Notes (Signed)
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 According to the patient, she was first diagnosed with blood clot in 2008. According to the patient it was unprovoked. The patient complained of shortness of breath and cough and chest discomfort. She was smoking at that time. She had imaging study of the lung done which show evidence of PE. According to the patient she was on a blood thinner for 2 years. The date of the CT scan was 06/24/2007. In 2014, she started to complain of intermittent sharp discomfort on the right leg associated with some mild swelling. She states that she had pain on the left leg as well but is not as severe compared to the right leg. She went to the emergency department. Results of the ultrasound venous Doppler came back consistent with acute deep vein thrombosis involving the posterior tibial and perioneal vein of the right lower extremity. D-dimer was elevated. She was placed on Lovenox and transition to warfarin 5 mg daily. Her INR has been supratherapeutic requiring vitamin K and multiple dose interruption to keep her INR at the desire range at 2-3. She denies any bleeding complications such as spontaneous epistaxis, hematuria, hematochezia, or the need for blood transfusions. She had mild intermittent chest discomfort throughout this and had imaging study done which show possible new clot in the left lung on the CT scan from September 2014. She has been pregnant 4 times including C-section for her pregnancy and never developed any peripartum blood clots. There were no family history of blood clots. In October 2014, I made a decision to change her to Xarelto. INTERVAL HISTORY: Tammy [name] 58 y.o. female returns for further follow-up. She is doing well. Her main complaints consists of bilateral leg achiness. She denies leg swelling. The patient denies any  recent signs or symptoms of bleeding such as spontaneous epistaxis, hematuria or hematochezia.  I have reviewed the past medical history, past surgical history, social history and family history with the patient and they are unchanged from previous note.  ALLERGIES:  has No Known Allergies.  MEDICATIONS:  Current Outpatient Prescriptions  Medication Sig Dispense Refill  . acetaminophen (TYLENOL) 500 MG tablet Take 1,000 mg by mouth every 6 (six) hours as needed for pain.    Marland Kitchen amoxicillin-clavulanate (AUGMENTIN) 875-125 MG per tablet Take 1 tablet by mouth 2 (two) times daily. 20 tablet 0  . atorvastatin (LIPITOR) 40 MG tablet Take 1 tablet (40 mg total) by mouth daily. 90 tablet 3  . cetirizine-pseudoephedrine (ZYRTEC-D) 5-120 MG per tablet Take 1 tablet by mouth 2 (two) times daily. 30 tablet 0  . chlorpheniramine-HYDROcodone (TUSSIONEX PENNKINETIC ER) 10-8 MG/5ML LQCR Take 5 mLs by mouth every 12 (twelve) hours. Take 5 mLs by mouth every 12 (twelve) hours. 140 mL 0  . guaiFENesin (MUCINEX) 600 MG 12 hr tablet Take 1 tablet (600 mg total) by mouth 2 (two) times daily. 30 tablet 0  . guaiFENesin-codeine 100-10 MG/5ML syrup Take 5 mLs by mouth every 4 (four) hours as needed for cough. 120 mL 0  . Vitamin D, Ergocalciferol, (DRISDOL) 50000 UNITS CAPS capsule Take 1 capsule (50,000 Units total) by mouth every 7 (seven) days. 12 capsule 0  . XARELTO 20 MG TABS tablet Take 1 tablet (20 mg total) by mouth daily with supper. 90 tablet 3   No current facility-administered medications for this visit.     REVIEW OF SYSTEMS:  Constitutional: Denies fevers, chills or night sweats Eyes: Denies blurriness of vision Ears, nose, mouth, throat, and face: Denies mucositis or sore throat Respiratory: Denies cough, dyspnea or wheezes Cardiovascular: Denies palpitation, chest discomfort or lower extremity swelling Gastrointestinal:  Denies nausea, heartburn or change in bowel habits Skin: Denies abnormal  skin rashes Lymphatics: Denies new lymphadenopathy or easy bruising Neurological:Denies numbness, tingling or new weaknesses Behavioral/Psych: Mood is stable, no new changes  All other systems were reviewed with the patient and are negative.  PHYSICAL EXAMINATION: ECOG PERFORMANCE STATUS: 1 - Symptomatic but completely ambulatory  Filed Vitals:   09/14/14 1110  BP: 130/72  Pulse: 105  Temp: 98.9 F (37.2 C)  Resp: 18   Filed Weights   09/14/14 1110  Weight: 195 lb 11.2 oz (88.769 kg)    GENERAL:alert, no distress and comfortable. She is morbidly obese SKIN: skin color, texture, turgor are normal, no rashes or significant lesions EYES: normal, Conjunctiva are pink and non-injected, sclera clear OROPHARYNX:no exudate, no erythema and lips, buccal mucosa, and tongue normal  NECK: supple, thyroid normal size, non-tender, without nodularity LYMPH:  no palpable lymphadenopathy in the cervical, axillary or inguinal LUNGS: clear to auscultation and percussion with normal breathing effort HEART: regular rate & rhythm and no murmurs and no lower extremity edema. Noted bilateral variscose veins. ABDOMEN:abdomen soft, non-tender and normal bowel sounds Musculoskeletal:no cyanosis of digits and no clubbing  NEURO: alert & oriented x 3 with fluent speech, no focal motor/sensory deficits  LABORATORY DATA:  I have reviewed the data as listed No results found for this or any previous visit (from the past 48 hour(s)).  Lab Results  Component Value Date   WBC 7.1 05/25/2014   HGB 13.1 05/25/2014   HCT 39.3 05/25/2014   MCV 88.9 05/25/2014   PLT 341 05/25/2014    ASSESSMENT & PLAN:  DVT (deep venous thrombosis) She is doing with very well since her treatment was switched to Xarelto. She has no complications from treatment. Goal of treatment is lifelong. I will discharge her from this clinic and recommend PCP follow-up. While on Xarelto, she would need minimum twice a year history,  physical examination and blood work.   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 She has evidence of bilateral varicose veins. Her most recent repeat ultrasound of the lower extremity excluded recurrence of DVT. I recommend elastic compression hose and gave her prescription for this.   All questions were answered. The patient knows to call the clinic with any problems, questions or concerns. No barriers to learning was detected.  I spent 25 minutes counseling the patient face to face. The total time spent in the appointment was 30 minutes and more than 50% was on counseling.     [facility], [place], MD 09/14/2014 12:31 PM

## 2014-09-14 NOTE — Assessment & Plan Note (Signed)
She have signs and symptoms of pos phlebitic syndrome. She has evidence of bilateral varicose veins. Her most recent repeat ultrasound of the lower extremity excluded recurrence of DVT. I recommend elastic compression hose and gave her prescription for this.

## 2014-09-20 NOTE — Addendum Note (Signed)
Addended by: Cristine PolioHAITHCOCK, Arville Postlewaite E on: 09/20/2014 09:36 PM      Modules accepted: Level of Service

## 2014-09-25 ENCOUNTER — Ambulatory Visit: Payer: Self-pay | Admitting: Internal Medicine

## 2014-10-06 NOTE — Progress Notes (Signed)
To this Nurse Navigators best knowledge, patient has had no further hospital or ED admissions.  Pt attended hospital follow up and is taking all medications as directed.   Navigation type closed. Episode resolved.   No further follow up scheduled.  Pt has my contact information for any further questions, concerns, or needs.  will ask GI to see her for consideration of an EGD and/or appropriate studies to confirm or disallow esophageal spasm   Pt will be contacted by referral coordinator to initiate that.

## 2014-10-09 ENCOUNTER — Ambulatory Visit: Payer: Self-pay | Attending: Internal Medicine | Admitting: Internal Medicine

## 2014-10-09 ENCOUNTER — Encounter: Payer: Self-pay | Admitting: Internal Medicine

## 2014-10-09 VITALS — BP 138/81 | HR 83 | Temp 98.7°F | Resp 16 | Ht 62.0 in | Wt 198.0 lb

## 2014-10-09 DIAGNOSIS — M4302 Spondylolysis, cervical region: Secondary | ICD-10-CM | POA: Insufficient documentation

## 2014-10-09 DIAGNOSIS — E785 Hyperlipidemia, unspecified: Secondary | ICD-10-CM | POA: Insufficient documentation

## 2014-10-09 DIAGNOSIS — R142 Eructation: Secondary | ICD-10-CM | POA: Insufficient documentation

## 2014-10-09 MED ORDER — RANITIDINE HCL 150 MG PO TABS: 150.0000 mg | ORAL_TABLET | Freq: Two times a day (BID) | ORAL | Status: DC

## 2014-10-09 MED ORDER — ATORVASTATIN CALCIUM 40 MG PO TABS: 40.0000 mg | ORAL_TABLET | Freq: Every day | ORAL | Status: DC

## 2014-10-09 MED ORDER — DICYCLOMINE HCL 10 MG PO CAPS: 10.0000 mg | ORAL_CAPSULE | Freq: Three times a day (TID) | ORAL | Status: DC

## 2014-10-09 NOTE — Progress Notes (Signed)
Patient ID: Tammy [name], female   DOB: [birth year], 57 y.o.   MRN: [id number]   Tammy [name], is a 58 y.o. female  [id number]  [id number]  DOB - October 03, 1956  Chief Complaint  Patient presents with  . Follow-up    numbness/tingling sensation in both hands        Subjective:   Tammy [name] is a 58 y.o. female here today for a follow up visit. Patient has recurrent DVT currently on Xarelto. She was first diagnosed with blood clot in 2008, according to the patient it was unprovoked. Patient complained of shortness of breath and cough and chest discomfort, imaging study of the lung showed evidence of PE. According to the patient she was on a blood thinner for 2 years. CT scan was done on 06/24/2007. In 2014, she started to complain of intermittent sharp discomfort on the right leg associated with some mild swelling. She states that she had pain on the left leg as well but is not as severe compared to the right leg. She went to the emergency department. Results of the ultrasound venous Doppler came back consistent with acute deep vein thrombosis involving the posterior tibial and perioneal vein of the right lower extremity. D-dimer was elevated. She was placed on Lovenox and transition to warfarin 5 mg daily. Her INR has been supratherapeutic requiring vitamin K and multiple dose interruption to keep her INR at the desire range at 2-3. She denies any bleeding complications such as spontaneous epistaxis, hematuria, hematochezia, or the need for blood transfusions. She had mild intermittent chest discomfort throughout this and had imaging study done which show possible new clot in the left lung on the CT scan from September 2014. She has been pregnant 4 times including C-section for her pregnancy and never developed any peripartum blood clots. There were no family history of blood clots. The oncologist recently changed her anticoagulation to Xarelto. Patient is here today for routine follow-up. She  has no new complaint except for belching x 6 months, she is worried because both of her brothers had symptoms symptoms and ended up developing MI requiring PCI. Her other medical history includes dyslipidemia and chronic neck pain. Patient has No headache, No chest pain, No abdominal pain - No Nausea, No new weakness tingling or numbness, No Cough - SOB.  Problem  Dyslipidemia  Cervical Spondylolysis    ALLERGIES: No Known Allergies  PAST MEDICAL HISTORY: Past Medical History  Diagnosis Date  . DVT (deep venous thrombosis)   . Pulmonary embolism   . Seasonal allergies   . Chronic headaches   . Leukemia     Told she had it at age 65, given some shots    MEDICATIONS AT HOME: Prior to Admission medications   Medication Sig Start Date End Date Taking? Authorizing Provider  XARELTO 20 MG TABS tablet Take 1 tablet (20 mg total) by mouth daily with supper. 09/14/14  Yes [name], MD  acetaminophen (TYLENOL) 500 MG tablet Take 1,000 mg by mouth every 6 (six) hours as needed for pain.    Historical Provider, MD  amoxicillin-clavulanate (AUGMENTIN) 875-125 MG per tablet Take 1 tablet by mouth 2 (two) times daily. Patient not taking: Reported on 10/09/2014 02/21/14   Tresa Garter, MD  atorvastatin (LIPITOR) 40 MG tablet Take 1 tablet (40 mg total) by mouth daily. 10/09/14   Tresa Garter, MD  cetirizine-pseudoephedrine (ZYRTEC-D) 5-120 MG per tablet Take 1 tablet by mouth 2 (two) times daily. Patient not taking: Reported  on 10/09/2014 02/09/14   Ruthell Rummage Dammen, PA-C  chlorpheniramine-HYDROcodone (TUSSIONEX PENNKINETIC ER) 10-8 MG/5ML LQCR Take 5 mLs by mouth every 12 (twelve) hours. Take 5 mLs by mouth every 12 (twelve) hours. Patient not taking: Reported on 10/09/2014 02/09/14   Ruthell Rummage Dammen, PA-C  dicyclomine (BENTYL) 10 MG capsule Take 1 capsule (10 mg total) by mouth 4 (four) times daily -  before meals and at bedtime. 10/09/14   Tresa Garter, MD  guaiFENesin (MUCINEX) 600 MG  12 hr tablet Take 1 tablet (600 mg total) by mouth 2 (two) times daily. 02/09/14   Ruthell Rummage Dammen, PA-C  guaiFENesin-codeine 100-10 MG/5ML syrup Take 5 mLs by mouth every 4 (four) hours as needed for cough. Patient not taking: Reported on 10/09/2014 02/21/14   Tresa Garter, MD  ranitidine (ZANTAC) 150 MG tablet Take 1 tablet (150 mg total) by mouth 2 (two) times daily. 10/09/14   Tresa Garter, MD  Vitamin D, Ergocalciferol, (DRISDOL) 50000 UNITS CAPS capsule Take 1 capsule (50,000 Units total) by mouth every 7 (seven) days. Patient not taking: Reported on 10/09/2014 06/07/14   Tresa Garter, MD     Objective:   Filed Vitals:   10/09/14 1709  BP: 138/81  Pulse: 83  Temp: 98.7 F (37.1 C)  TempSrc: Oral  Resp: 16  Height: 5\' 2"  (1.575 m)  Weight: 198 lb (89.812 kg)  SpO2: 97%    Exam General appearance : Awake, alert, not in any distress. Speech Clear. Not toxic looking HEENT: Atraumatic and Normocephalic, pupils equally reactive to light and accomodation Neck: supple, no JVD. No cervical lymphadenopathy.  Chest:Good air entry bilaterally, no added sounds  CVS: S1 S2 regular, no murmurs.  Abdomen: Bowel sounds present, Non tender and not distended with no gaurding, rigidity or rebound. Extremities: B/L Lower Ext shows no edema, both legs are warm to touch Neurology: Awake alert, and oriented X 3, CN II-XII intact, Non focal Skin:No Rash Wounds:N/A  Data Review Lab Results  Component Value Date   HGBA1C  07/04/2007    5.7 (NOTE)   The ADA recommends the following therapeutic goals for glycemic   control related to Hgb A1C measurement:   Goal of Therapy:   < 7.0% Hgb A1C   Action Suggested:  > 8.0% Hgb A1C   Ref:  Diabetes Care, 22, Suppl. 1, 1999     Assessment & Plan   1. Dyslipidemia  - atorvastatin (LIPITOR) 40 MG tablet; Take 1 tablet (40 mg total) by mouth daily.  Dispense: 90 tablet; Refill: 3  To address this please limit saturated fat to no more  than 7% of your calories, limit cholesterol to 200 mg/day, increase fiber and exercise as tolerated. If needed we may add another cholesterol lowering medication to your regimen.   2. Cervical spondylolysis  - Ambulatory referral to Neurosurgery  3. Belching  - ranitidine (ZANTAC) 150 MG tablet; Take 1 tablet (150 mg total) by mouth 2 (two) times daily.  Dispense: 60 tablet; Refill: 3 - dicyclomine (BENTYL) 10 MG capsule; Take 1 capsule (10 mg total) by mouth 4 (four) times daily -  before meals and at bedtime.  Dispense: 30 capsule; Refill: 0  Return in about 3 months (around 01/08/2015) for Liver Function Test, DVT follow up.  The patient was given clear instructions to go to ER or return to medical center if symptoms don't improve, worsen or new problems develop. The patient verbalized understanding. The patient was told to call  to get lab results if they haven't heard anything in the next week.   This note has been created with Surveyor, quantity. Any transcriptional errors are unintentional.    Angelica Chessman, MD, [place], [place], [place], [place]   10/09/2014, 5:41 PM

## 2014-10-09 NOTE — Patient Instructions (Signed)
Dyslipidemia Dyslipidemia is an imbalance of the lipids in your blood. Lipids are waxy, fat-like proteins that your body needs in small amounts. Dyslipidemia often involves the lipids cholesterol or triglycerides. Common forms of dyslipidemia are:  High levels of bad cholesterol (LDL cholesterol). LDL cholesterol is the type of cholesterol that causes heart disease.  Low levels of good cholesterol (HDL cholesterol). HDL cholesterol is the type of cholesterol that helps protect against heart disease.  High levels of triglycerides. Triglycerides are a fatty substance in the blood linked to a buildup of plaque on your arteries. RISK FACTORS  Increased age.  Having a family history of high cholesterol.  Certain medicines, including birth control pills, diuretics, beta-blockers, and some medicines for depression.  Smoking.  Eating a high-fat diet.  Being overweight.  Medical conditions such as diabetes, polycystic ovary syndrome, pregnancy, kidney disease, and hypothyroidism.  Lack of regular exercise. SIGNS AND SYMPTOMS There are no signs or symptoms with dyslipidemia.  DIAGNOSIS  A simple blood test called a fasting blood test can be done to determine your level of:  Total cholesterol. This is the combined number of LDL cholesterol and HDL cholesterol. A healthy number is lower than 200.  LDL cholesterol. The goal number for LDL cholesterol is different for each person depending on risk factors. Ask your health care provider what your LDL cholesterol number should be.  HDL cholesterol. A healthy level of HDL cholesterol is 60 or higher. A number lower than 40 for men or 50 for women is a danger sign.  Triglycerides. A healthy triglyceride number is less than 150. TREATMENT  Dyslipidemia is a treatable condition. Your health care provider will advise you on what type of treatment is best based on your age, your test results, and current guidelines. Treatment may include:   Dietary  changes. A dietitian can help you create a meal plan. You may need to:  Eat more foods that contain omega-3s, such as salmon and other fish.  Replace saturated fats and trans fats in your diet with healthy fats such as nuts, seeds, avocados, olive oil, and canola oil.  Regular exercise. This can help lower your LDL cholesterol, raise your HDL cholesterol, and help with weight management. Check with your health care provider before beginning an exercise program. Most people should participate in 30 minutes of brisk exercise 5 days a week.  Quitting smoking.  Medicines to lower LDL cholesterol and triglycerides. Your health care provider will monitor your lipid levels with regular blood tests. HOME CARE INSTRUCTIONS  Eat a healthy diet. Follow any diet instructions if they were given to you by your health care provider.  Maintain a healthy weight.  Exercise regularly based on the recommendations of your health care provider.  Do not use any tobacco products, including cigarettes, chewing tobacco, or electronic cigarettes.  Take medicines only as directed by your health care provider.  Keep all follow-up visits as directed by your health care provider. SEEK MEDICAL CARE IF: You are having possible side effects from your medicines. Document Released: 10/25/2013 Document Revised: 03/06/2014 Document Reviewed: 10/25/2013 [facility] Patient Information 2015 [place], [place]. This information is not intended to replace advice given to you by your health care provider. Make sure you discuss any questions you have with your health care provider. Deep Vein Thrombosis A deep vein thrombosis (DVT) is a blood clot that develops in the deep, larger veins of the leg, arm, or pelvis. These are more dangerous than clots that might form in veins  near the surface of the body. A DVT can lead to serious and even life-threatening complications if the clot breaks off and travels in the bloodstream to the lungs.    A DVT can damage the valves in your leg veins so that instead of flowing upward, the blood pools in the lower leg. This is called post-thrombotic syndrome, and it can result in pain, swelling, discoloration, and sores on the leg. CAUSES Usually, several things contribute to the formation of blood clots. Contributing factors include:  The flow of blood slows down.  The inside of the vein is damaged in some way.  You have a condition that makes blood clot more easily. RISK FACTORS Some people are more likely than others to develop blood clots. Risk factors include:   Smoking.  Being overweight (obese).  Sitting or lying still for a long time. This includes long-distance travel, paralysis, or recovery from an illness or surgery. Other factors that increase risk are:   Older age, especially over 53 years of age.  Having a family history of blood clots or if you have already had a blot clot.  Having major or lengthy surgery. This is especially true for surgery on the hip, knee, or belly (abdomen). Hip surgery is particularly high risk.  Having a long, thin tube (catheter) placed inside a vein during a medical procedure.  Breaking a hip or leg.  Having cancer or cancer treatment.  Pregnancy and childbirth.  Hormone changes make the blood clot more easily during pregnancy.  The fetus puts pressure on the veins of the pelvis.  There is a risk of injury to veins during delivery or a caesarean delivery. The risk is highest just after childbirth.  Medicines containing the female hormone estrogen. This includes birth control pills and hormone replacement therapy.  Other circulation or heart problems.  SIGNS AND SYMPTOMS When a clot forms, it can either partially or totally block the blood flow in that vein. Symptoms of a DVT can include:  Swelling of the leg or arm, especially if one side is much worse.  Warmth and redness of the leg or arm, especially if one side is much  worse.  Pain in an arm or leg. If the clot is in the leg, symptoms may be more noticeable or worse when standing or walking. The symptoms of a DVT that has traveled to the lungs (pulmonary embolism, PE) usually start suddenly and include:  Shortness of breath.  Coughing.  Coughing up blood or blood-tinged mucus.  Chest pain. The chest pain is often worse with deep breaths.  Rapid heartbeat. Anyone with these symptoms should get emergency medical treatment right away. Do not wait to see if the symptoms will go away. Call your local emergency services (911 in the [place] if you have these symptoms. Do not drive yourself to the hospital. DIAGNOSIS If a DVT is suspected, your health care provider will take a full medical history and perform a physical exam. Tests that also may be required include:  Blood tests, including studies of the clotting properties of the blood.  Ultrasound to see if you have clots in your legs or lungs.  X-rays to show the flow of blood when dye is injected into the veins (venogram).  Studies of your lungs if you have any chest symptoms. PREVENTION  Exercise the legs regularly. Take a brisk 30-minute walk every day.  Maintain a weight that is appropriate for your height.  Avoid sitting or lying in bed for long  periods of time without moving your legs.  Women, particularly those over the age of 70 years, should consider the risks and benefits of taking estrogen medicines, including birth control pills.  Do not smoke, especially if you take estrogen medicines.  Long-distance travel can increase your risk of DVT. You should exercise your legs by walking or pumping the muscles every hour.  Many of the risk factors above relate to situations that exist with hospitalization, either for illness, injury, or elective surgery. Prevention may include medical and nonmedical measures.  Your health care provider will assess you for the need for venous thromboembolism  prevention when you are admitted to the hospital. If you are having surgery, your surgeon will assess you the day of or day after surgery. TREATMENT Once identified, a DVT can be treated. It can also be prevented in some circumstances. Once you have had a DVT, you may be at increased risk for a DVT in the future. The most common treatment for DVT is blood-thinning (anticoagulant) medicine, which reduces the blood's tendency to clot. Anticoagulants can stop new blood clots from forming and stop old clots from growing. They cannot dissolve existing clots. Your body does this by itself over time. Anticoagulants can be given by mouth, through an IV tube, or by injection. Your health care provider will determine the best program for you. Other medicines or treatments that may be used are:  Heparin or related medicines (low molecular weight heparin) are often the first treatment for a blood clot. They act quickly. However, they cannot be taken orally and must be given either in shot form or by IV tube.  Heparin can cause a fall in a component of blood that stops bleeding and forms blood clots (platelets). You will be monitored with blood tests to be sure this does not occur.  Warfarin is an anticoagulant that can be swallowed. It takes a few days to start working, so usually heparin or related medicines are used in combination. Once warfarin is working, heparin is usually stopped.  Factor Xa inhibitor medicines, such as rivaroxaban and apixaban, also reduce blood clotting. These medicines are taken orally and can often be used without heparin or related medicines.  Less commonly, clot dissolving drugs (thrombolytics) are used to dissolve a DVT. They carry a high risk of bleeding, so they are used mainly in severe cases where your life or a part of your body is threatened.  Very rarely, a blood clot in the leg needs to be removed surgically.  If you are unable to take anticoagulants, your health care  provider may arrange for you to have a filter placed in a main vein in your abdomen. This filter prevents clots from traveling to your lungs. HOME CARE INSTRUCTIONS  Take all medicines as directed by your health care provider.  Learn as much as you can about DVT.  Wear a medical alert bracelet or carry a medical alert card.  Ask your health care provider how soon you can go back to normal activities. It is important to stay active to prevent blood clots. If you are on anticoagulant medicine, avoid contact sports.  It is very important to exercise. This is especially important while traveling, sitting, or standing for long periods of time. Exercise your legs by walking or by tightening and relaxing your leg muscles regularly. Take frequent walks.  You may need to wear compression stockings. These are tight elastic stockings that apply pressure to the lower legs. This pressure can help  keep the blood in the legs from clotting. Taking Warfarin Warfarin is a daily medicine that is taken by mouth. Your health care provider will advise you on the length of treatment (usually 3-6 months, sometimes lifelong). If you take warfarin:  Understand how to take warfarin and foods that can affect how warfarin works in Veterinary surgeon.  Too much and too little warfarin are both dangerous. Too much warfarin increases the risk of bleeding. Too little warfarin continues to allow the risk for blood clots. Warfarin and Regular Blood Testing While taking warfarin, you will need to have regular blood tests to measure your blood clotting time. These blood tests usually include both the prothrombin time (PT) and international normalized ratio (INR) tests. The PT and INR results allow your health care provider to adjust your dose of warfarin. It is very important that you have your PT and INR tested as often as directed by your health care provider.  Warfarin and Your Diet Avoid major changes in your diet, or notify your  health care provider before changing your diet. Arrange a visit with a registered dietitian to answer your questions. Many foods, especially foods high in vitamin K, can interfere with warfarin and affect the PT and INR results. You should eat a consistent amount of foods high in vitamin K. Foods high in vitamin K include:   Spinach, kale, broccoli, cabbage, collard and turnip greens, Brussels sprouts, peas, cauliflower, seaweed, and parsley.  Beef and pork liver.  Green tea.  Soybean oil. Warfarin with Other Medicines Many medicines can interfere with warfarin and affect the PT and INR results. You must:  Tell your health care provider about any and all medicines, vitamins, and supplements you take, including aspirin and other over-the-counter anti-inflammatory medicines. Be especially cautious with aspirin and anti-inflammatory medicines. Ask your health care provider before taking these.  Do not take or discontinue any prescribed or over-the-counter medicine except on the advice of your health care provider or pharmacist. Warfarin Side Effects Warfarin can have side effects, such as easy bruising and difficulty stopping bleeding. Ask your health care provider or pharmacist about other side effects of warfarin. You will need to:  Hold pressure over cuts for longer than usual.  Notify your dentist and other health care providers that you are taking warfarin before you undergo any procedures where bleeding may occur. Warfarin with Alcohol and Tobacco   Drinking alcohol frequently can increase the effect of warfarin, leading to excess bleeding. It is best to avoid alcoholic drinks or to consume only very small amounts while taking warfarin. Notify your health care provider if you change your alcohol intake.   Do not use any tobacco products including cigarettes, chewing tobacco, or electronic cigarettes. If you smoke, quit. Ask your health care provider for help with quitting  smoking. Alternative Medicines to Warfarin: Factor Xa Inhibitor Medicines  These blood-thinning medicines are taken by mouth, usually for several weeks or longer. It is important to take the medicine every single day at the same time each day.  There are no regular blood tests required when using these medicines.  There are fewer food and drug interactions than with warfarin.  The side effects of this class of medicine are similar to those of warfarin, including excessive bruising or bleeding. Ask your health care provider or pharmacist about other potential side effects. SEEK MEDICAL CARE IF:  You notice a rapid heartbeat.  You feel weaker or more tired than usual.  You feel faint.  You notice increased bruising.  You feel your symptoms are not getting better in the time expected.  You believe you are having side effects of medicine. SEEK IMMEDIATE MEDICAL CARE IF:  You have chest pain.  You have trouble breathing.  You have new or increased swelling or pain in one leg.  You cough up blood.  You notice blood in vomit, in a bowel movement, or in urine. MAKE SURE YOU:  Understand these instructions.  Will watch your condition.  Will get help right away if you are not doing well or get worse. Document Released: 10/20/2005 Document Revised: 03/06/2014 Document Reviewed: 06/27/2013 [facility] Patient Information 2015 [place], [place]. This information is not intended to replace advice given to you by your health care provider. Make sure you discuss any questions you have with your health care provider.

## 2014-10-09 NOTE — Progress Notes (Signed)
Pt here for f/u visit Pt is taking Xarelto for DVT C/o numbness/tingling sensation in both hands C/o freq belching x 6 mnths. States both of brothers had same sx's diagnosed with MI s/p stent placement Also c/o feeling like she is choking while lying down

## 2014-10-12 ENCOUNTER — Encounter: Attending: Internal Medicine | Primary: Family Medicine

## 2014-10-16 ENCOUNTER — Encounter: Attending: Internal Medicine | Primary: Family Medicine

## 2014-10-17 ENCOUNTER — Encounter: Payer: Self-pay | Admitting: Hematology and Oncology

## 2014-10-17 NOTE — Progress Notes (Signed)
Patient was approved for asst with Xarelto via [employer] for one year  12/22/13-12/2014

## 2014-10-30 ENCOUNTER — Telehealth: Payer: Self-pay | Admitting: Internal Medicine

## 2014-10-30 ENCOUNTER — Telehealth: Payer: Self-pay | Admitting: Emergency Medicine

## 2014-10-30 NOTE — Telephone Encounter (Signed)
Has been trying to get in contact with the nurse. Please follow up.

## 2014-10-30 NOTE — Telephone Encounter (Signed)
Left message for pt to return call to speak with nurse

## 2015-02-06 ENCOUNTER — Encounter: Payer: Self-pay | Admitting: Hematology and Oncology

## 2015-02-06 NOTE — Progress Notes (Signed)
I faxed to [employer] [phone] form for possible asst with Tammy [name]

## 2015-02-06 NOTE — Progress Notes (Signed)
I placed form from [employer] for Xarelto asst on desk of nurse for Dr. Alvy Bimler.

## 2015-03-08 ENCOUNTER — Ambulatory Visit: Payer: Self-pay | Attending: Internal Medicine | Admitting: Internal Medicine

## 2015-03-08 ENCOUNTER — Encounter: Payer: Self-pay | Admitting: Internal Medicine

## 2015-03-08 VITALS — BP 130/80 | HR 100 | Temp 97.8°F | Resp 16 | Ht 62.0 in | Wt 199.6 lb

## 2015-03-08 DIAGNOSIS — Z7901 Long term (current) use of anticoagulants: Secondary | ICD-10-CM | POA: Insufficient documentation

## 2015-03-08 DIAGNOSIS — Z86711 Personal history of pulmonary embolism: Secondary | ICD-10-CM | POA: Insufficient documentation

## 2015-03-08 DIAGNOSIS — Z86718 Personal history of other venous thrombosis and embolism: Secondary | ICD-10-CM | POA: Insufficient documentation

## 2015-03-08 DIAGNOSIS — Z6836 Body mass index (BMI) 36.0-36.9, adult: Secondary | ICD-10-CM | POA: Insufficient documentation

## 2015-03-08 DIAGNOSIS — R0789 Other chest pain: Secondary | ICD-10-CM | POA: Insufficient documentation

## 2015-03-08 DIAGNOSIS — R51 Headache: Secondary | ICD-10-CM | POA: Insufficient documentation

## 2015-03-08 DIAGNOSIS — Z1211 Encounter for screening for malignant neoplasm of colon: Secondary | ICD-10-CM

## 2015-03-08 DIAGNOSIS — H269 Unspecified cataract: Secondary | ICD-10-CM | POA: Insufficient documentation

## 2015-03-08 DIAGNOSIS — I82409 Acute embolism and thrombosis of unspecified deep veins of unspecified lower extremity: Secondary | ICD-10-CM

## 2015-03-08 DIAGNOSIS — R1032 Left lower quadrant pain: Secondary | ICD-10-CM | POA: Insufficient documentation

## 2015-03-08 DIAGNOSIS — E785 Hyperlipidemia, unspecified: Secondary | ICD-10-CM | POA: Insufficient documentation

## 2015-03-08 DIAGNOSIS — Z87891 Personal history of nicotine dependence: Secondary | ICD-10-CM | POA: Insufficient documentation

## 2015-03-08 LAB — POCT URINALYSIS DIPSTICK
BILIRUBIN UA: NEGATIVE
Glucose, UA: NEGATIVE
KETONES UA: NEGATIVE
Leukocytes, UA: NEGATIVE
Nitrite, UA: NEGATIVE
PH UA: 5.5
Protein, UA: 30
Spec Grav, UA: >=1.03
Urobilinogen, UA: 0.2

## 2015-03-08 LAB — CBC WITH DIFFERENTIAL/PLATELET
BASOS PCT: 1 % (ref 0–1)
Basophils Absolute: 0.1 10*3/uL (ref 0.0–0.1)
EOS ABS: 0.1 10*3/uL (ref 0.0–0.7)
EOS PCT: 2 % (ref 0–5)
HEMATOCRIT: 38.3 % (ref 36.0–46.0)
HEMOGLOBIN: 12.6 g/dL (ref 12.0–15.0)
LYMPHS ABS: 2 10*3/uL (ref 0.7–4.0)
Lymphocytes Relative: 31 % (ref 12–46)
MCH: 29.2 pg (ref 26.0–34.0)
MCHC: 32.9 g/dL (ref 30.0–36.0)
MCV: 88.7 fL (ref 78.0–100.0)
MPV: 9.6 fL (ref 8.6–12.4)
Monocytes Absolute: 0.5 10*3/uL (ref 0.1–1.0)
Monocytes Relative: 7 % (ref 3–12)
Neutro Abs: 3.9 10*3/uL (ref 1.7–7.7)
Neutrophils Relative %: 59 % (ref 43–77)
Platelets: 355 10*3/uL (ref 150–400)
RBC: 4.32 MIL/uL (ref 3.87–5.11)
RDW: 13.6 % (ref 11.5–15.5)
WBC: 6.6 10*3/uL (ref 4.0–10.5)

## 2015-03-08 LAB — COMPLETE METABOLIC PANEL WITH GFR
ALK PHOS: 106 U/L (ref 39–117)
ALT: 15 U/L (ref 0–35)
AST: 16 U/L (ref 0–37)
Albumin: 3.9 g/dL (ref 3.5–5.2)
BUN: 9 mg/dL (ref 6–23)
CO2: 25 meq/L (ref 19–32)
Calcium: 9.3 mg/dL (ref 8.4–10.5)
Chloride: 101 mEq/L (ref 96–112)
Creat: 1.09 mg/dL (ref 0.50–1.10)
GFR, EST AFRICAN AMERICAN: 65 mL/min
GFR, Est Non African American: 56 mL/min — ABNORMAL LOW
GLUCOSE: 106 mg/dL — AB (ref 70–99)
POTASSIUM: 4.5 meq/L (ref 3.5–5.3)
SODIUM: 136 meq/L (ref 135–145)
TOTAL PROTEIN: 7 g/dL (ref 6.0–8.3)
Total Bilirubin: 0.8 mg/dL (ref 0.2–1.2)

## 2015-03-08 LAB — LIPID PANEL
CHOL/HDL RATIO: 6.8 ratio
CHOLESTEROL: 253 mg/dL — AB (ref 0–200)
HDL: 37 mg/dL — AB (ref >=46)
Triglycerides: 403 mg/dL — ABNORMAL HIGH (ref <150)

## 2015-03-08 LAB — POCT GLYCOSYLATED HEMOGLOBIN (HGB A1C): Hemoglobin A1C: 5.9

## 2015-03-08 LAB — TSH: TSH: 0.836 u[IU]/mL (ref 0.350–4.500)

## 2015-03-08 MED ORDER — ATORVASTATIN CALCIUM 40 MG PO TABS: 40.0000 mg | ORAL_TABLET | Freq: Every day | ORAL | Status: DC

## 2015-03-08 MED ORDER — XARELTO 20 MG PO TABS: 20.0000 mg | ORAL_TABLET | Freq: Every day | ORAL | Status: DC

## 2015-03-08 MED ORDER — THERA VITAL M PO TABS: 1.0000 | ORAL_TABLET | Freq: Every day | ORAL | Status: DC

## 2015-03-08 NOTE — Progress Notes (Signed)
Patient ID: Tammy [name], female   DOB: [birth year], 59 y.o.   MRN: [id number]   Tammy [name], is a 58 y.o. female  AOZ:[id number]  [id number]  DOB - [birth year]  Chief Complaint  Patient presents with  . Headache        Subjective:   Tammy [name] is a 59 y.o. female here today for a follow up visit. 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 Her major complaint today is occasional headache especially in the mornings uses, no associated nausea or vomiting, no blurry vision. She also complained of feeling weak tightness in the left abdominal area radiating to the back as well as right sided chest tightness that comes and goes, sometimes once a week, started about a month or 2 ago. She was first diagnosed with blood clot in 2008. According to the patient it was unprovoked. The patient complained of shortness of breath and cough and chest discomfort. She was smoking at that time. She had imaging study of the lung done which show evidence of PE. She was seen by hematologist oncologist and started on Xarelto. She denies any bleeding complications such as spontaneous epistaxis, hematuria, hematochezia, or the need for blood transfusions. She has had this intermittent chest discomfort and tightness since 2014 according to her medical record. Patient has No headache today, No chest pain, No Nausea, No new weakness tingling or numbness, No Cough - SOB. She was prescribed statin for hyperlipidemia but she has not been taking it. She only takes Xarelto the moment.  Problem  Abdominal Pain, Left Lower Quadrant  Colon Cancer Screening    ALLERGIES: No Known Allergies  PAST MEDICAL HISTORY: Past Medical History  Diagnosis Date  . DVT (deep venous thrombosis)   . Pulmonary embolism   . Seasonal allergies   . Chronic headaches   . 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 Authorizing Provider  acetaminophen (TYLENOL) 500 MG tablet Take 1,000 mg by mouth every 6 (six) hours as needed for pain.    Historical Provider, MD  amoxicillin-clavulanate (AUGMENTIN) 875-125 MG per tablet Take 1 tablet by mouth 2 (two) times daily. Patient not taking: Reported on 10/09/2014 02/21/14   Tresa Garter, MD  atorvastatin (LIPITOR) 40 MG tablet Take 1 tablet (40 mg total) by mouth daily. 03/08/15   Tresa Garter, MD  cetirizine-pseudoephedrine (ZYRTEC-D) 5-120 MG per tablet Take 1 tablet by mouth 2 (two) times daily. Patient not taking: Reported on 10/09/2014 02/09/14   Hazel Sams, PA-C  chlorpheniramine-HYDROcodone [facility]) 10-8 MG/5ML LQCR Take 5 mLs by mouth every 12 (twelve) hours. Take 5 mLs by mouth every 12 (twelve) hours. Patient not taking: Reported on 10/09/2014 02/09/14   Hazel Sams, PA-C  dicyclomine (BENTYL) 10 MG capsule Take 1 capsule (10 mg total) by mouth 4 (four) times daily -  before meals and at bedtime. 10/09/14   Tresa Garter, MD  guaiFENesin (MUCINEX) 600 MG 12 hr tablet Take 1 tablet (600 mg total) by mouth 2 (two) times daily. 02/09/14   Hazel Sams, PA-C  guaiFENesin-codeine 100-10 MG/5ML syrup Take 5 mLs by mouth every 4 (four) hours as needed for cough. Patient not taking: Reported on 10/09/2014 02/21/14   Tresa Garter, MD  Multiple Vitamins-Minerals (MULTIVITAMIN) tablet Take 1 tablet by mouth  daily. 03/08/15   Tresa Garter, MD  ranitidine (ZANTAC) 150 MG tablet Take 1 tablet (150 mg total) by mouth 2 (two) times daily. 10/09/14   Tresa Garter, MD  Vitamin D, Ergocalciferol, (DRISDOL) 50000 UNITS CAPS capsule Take 1 capsule (50,000 Units total) by mouth every 7 (seven) days. Patient not taking: Reported on 10/09/2014 06/07/14   Tresa Garter, MD  XARELTO 20 MG TABS tablet Take 1 tablet (20 mg total) by mouth daily with supper. 03/08/15   Tresa Garter, MD      Objective:   Filed Vitals:   03/08/15 0927  BP: 130/80  Pulse: 100  Temp: 97.8 F (36.6 C)  Resp: 16  Height: 5\' 2"  (1.575 m)  Weight: 199 lb 9.6 oz (90.538 kg)  SpO2: 100%    Exam General appearance : Awake, alert, not in any distress. Speech Clear. Not toxic looking, uses cane for ambulation, morbidly obese, heavily chested HEENT: Atraumatic and Normocephalic, pupils equally reactive to light and accomodation Neck: supple, no JVD. No cervical lymphadenopathy.  Chest:Good air entry bilaterally, no added sounds  CVS: S1 S2 regular, no murmurs.  Abdomen: Bowel sounds present, Non tender and not distended with no gaurding, rigidity or rebound. Extremities: B/L Lower Ext shows no edema, both legs are warm to touch Neurology: Awake alert, and oriented X 3, CN II-XII intact, Non focal Skin:No Rash  Data Review Lab Results  Component Value Date   HGBA1C  07/04/2007    5.7 (NOTE)   The ADA recommends the following therapeutic goals for glycemic   control related to Hgb A1C measurement:   Goal of Therapy:   < 7.0% Hgb A1C   Action Suggested:  > 8.0% Hgb A1C   Ref:  Diabetes Care, 22, Suppl. 1, 1999     Assessment & Plan   1. Abdominal pain, left lower quadrant  - Urinalysis Dipstick - CBC with Differential/Platelet - COMPLETE METABOLIC PANEL WITH GFR - POCT glycosylated hemoglobin (Hb A1C) - Lipid panel - TSH - Vit D  25 hydroxy (rtn osteoporosis monitoring) There is no findings on abdominal examination, urine analysis is negative for UTI. Will investigate as above and review of tests for any positive finding. I don't think there is need for imaging studies at the moment especially since symptom is only intermittent with no other associated findings that are worrisome.  2. Dyslipidemia  - atorvastatin (LIPITOR) 40 MG tablet; Take 1 tablet (40 mg total) by mouth daily.  Dispense: 90 tablet; Refill: 3  To address this please limit saturated fat to no more than 7% of  your calories, limit cholesterol to 200 mg/day, increase fiber and exercise as tolerated. If needed we may add another cholesterol lowering medication to your regimen.   3. Acute DVT (deep venous thrombosis)  - XARELTO 20 MG TABS tablet; Take 1 tablet (20 mg total) by mouth daily with supper.  Dispense: 90 tablet; Refill: 3 - Multiple Vitamins-Minerals (MULTIVITAMIN) tablet; Take 1 tablet by mouth daily.  Dispense: 90 tablet; Refill: 3  4. Colon cancer screening  - HM COLONOSCOPY - Ambulatory referral to Gastroenterology  5. Cataract  - Ambulatory referral to Ophthalmology  Patient have been counseled extensively about nutrition and exercise Return in about 3 months (around 06/08/2015) for Follow up HTN, Routine Follow Up.  The patient was given clear instructions to go to ER or return to medical center if symptoms don't improve, worsen or new problems develop. The patient verbalized understanding. The patient was  told to call to get lab results if they haven't heard anything in the next week.   This note has been created with Surveyor, quantity. Any transcriptional errors are unintentional.    Angelica Chessman, MD, [place], [place], [place], [place] and [place], [place]   03/08/2015, 10:12 AM

## 2015-03-08 NOTE — Patient Instructions (Signed)
Dizziness Dizziness is a common problem. It is a feeling of unsteadiness or light-headedness. You may feel like you are about to faint. Dizziness can lead to injury if you stumble or fall. A person of any age group can suffer from dizziness, but dizziness is more common in older adults. CAUSES  Dizziness can be caused by many different things, including:  Middle ear problems.  Standing for too long.  Infections.  An allergic reaction.  Aging.  An emotional response to something, such as the sight of blood.  Side effects of medicines.  Tiredness.  Problems with circulation or blood pressure.  Excessive use of alcohol or medicines, or illegal drug use.  Breathing too fast (hyperventilation).  An irregular heart rhythm (arrhythmia).  A low red blood cell count (anemia).  Pregnancy.  Vomiting, diarrhea, fever, or other illnesses that cause body fluid loss (dehydration).  Diseases or conditions such as Parkinson's disease, high blood pressure (hypertension), diabetes, and thyroid problems.  Exposure to extreme heat. DIAGNOSIS  Your health care provider will ask about your symptoms, perform a physical exam, and perform an electrocardiogram (ECG) to record the electrical activity of your heart. Your health care provider may also perform other heart or blood tests to determine the cause of your dizziness. These may include:  Transthoracic echocardiogram (TTE). During echocardiography, sound waves are used to evaluate how blood flows through your heart.  Transesophageal echocardiogram (TEE).  Cardiac monitoring. This allows your health care provider to monitor your heart rate and rhythm in real time.  Holter monitor. This is a portable device that records your heartbeat and can help diagnose heart arrhythmias. It allows your health care provider to track your heart activity for several days if needed.  Stress tests by exercise or by giving medicine that makes the heart beat  faster. TREATMENT  Treatment of dizziness depends on the cause of your symptoms and can vary greatly. HOME CARE INSTRUCTIONS   Drink enough fluids to keep your urine clear or pale yellow. This is especially important in very hot weather. In older adults, it is also important in cold weather.  Take your medicine exactly as directed if your dizziness is caused by medicines. When taking blood pressure medicines, it is especially important to get up slowly.  Rise slowly from chairs and steady yourself until you feel okay.  In the morning, first sit up on the side of the bed. When you feel okay, stand slowly while holding onto something until you know your balance is fine.  Move your legs often if you need to stand in one place for a long time. Tighten and relax your muscles in your legs while standing.  Have someone stay with you for 1-2 days if dizziness continues to be a problem. Do this until you feel you are well enough to stay alone. Have the person call your health care provider if he or she notices changes in you that are concerning.  Do not drive or use heavy machinery if you feel dizzy.  Do not drink alcohol. SEEK IMMEDIATE MEDICAL CARE IF:   Your dizziness or light-headedness gets worse.  You feel nauseous or vomit.  You have problems talking, walking, or using your arms, hands, or legs.  You feel weak.  You are not thinking clearly or you have trouble forming sentences. It may take a friend or family member to notice this.  You have chest pain, abdominal pain, shortness of breath, or sweating.  Your vision changes.  You notice   any bleeding.  You have side effects from medicine that seems to be getting worse rather than better. MAKE SURE YOU:   Understand these instructions.  Will watch your condition.  Will get help right away if you are not doing well or get worse. Document Released: 04/15/2001 Document Revised: 10/25/2013 Document Reviewed: 05/09/2011 [facility]  Patient Information 2015 [place], [place]. This information is not intended to replace advice given to you by your health care provider. Make sure you discuss any questions you have with your health care provider. Abdominal Pain Many things can cause abdominal pain. Usually, abdominal pain is not caused by a disease and will improve without treatment. It can often be observed and treated at home. Your health care provider will do a physical exam and possibly order blood tests and X-rays to help determine the seriousness of your pain. However, in many cases, more time must pass before a clear cause of the pain can be found. Before that point, your health care provider may not know if you need more testing or further treatment. HOME CARE INSTRUCTIONS  Monitor your abdominal pain for any changes. The following actions may help to alleviate any discomfort you are experiencing:  Only take over-the-counter or prescription medicines as directed by your health care provider.  Do not take laxatives unless directed to do so by your health care provider.  Try a clear liquid diet (broth, tea, or water) as directed by your health care provider. Slowly move to a bland diet as tolerated. SEEK MEDICAL CARE IF:  You have unexplained abdominal pain.  You have abdominal pain associated with nausea or diarrhea.  You have pain when you urinate or have a bowel movement.  You experience abdominal pain that wakes you in the night.  You have abdominal pain that is worsened or improved by eating food.  You have abdominal pain that is worsened with eating fatty foods.  You have a fever. SEEK IMMEDIATE MEDICAL CARE IF:   Your pain does not go away within 2 hours.  You keep throwing up (vomiting).  Your pain is felt only in portions of the abdomen, such as the right side or the left lower portion of the abdomen.  You pass bloody or black tarry stools. MAKE SURE YOU:  Understand these instructions.   Will  watch your condition.   Will get help right away if you are not doing well or get worse.  Document Released: 07/30/2005 Document Revised: 10/25/2013 Document Reviewed: 06/29/2013 [facility] Patient Information 2015 [place], [place]. This information is not intended to replace advice given to you by your health care provider. Make sure you discuss any questions you have with your health care provider.

## 2015-03-08 NOTE — Progress Notes (Signed)
Patient here for follow up Complains of headaches and dizziness off and on  That started about a month  Ago Patient also complains of left sided abd pain and muscle tightness to the right Side of her chest

## 2015-03-09 LAB — VITAMIN D 25 HYDROXY (VIT D DEFICIENCY, FRACTURES): VIT D 25 HYDROXY: 10 ng/mL — AB (ref 30–100)

## 2015-03-12 ENCOUNTER — Other Ambulatory Visit: Payer: Self-pay | Admitting: Internal Medicine

## 2015-03-12 ENCOUNTER — Telehealth: Payer: Self-pay | Admitting: Emergency Medicine

## 2015-03-12 MED ORDER — VITAMIN D (ERGOCALCIFEROL) 1.25 MG (50000 UNIT) PO CAPS: 50000.0000 [IU] | ORAL_CAPSULE | ORAL | Status: DC

## 2015-03-12 NOTE — Telephone Encounter (Signed)
-----   Message from Tresa Garter, MD sent at 03/12/2015  4:31 PM EDT ----- Please inform patient that her laboratory test results are mostly within normal limits. Although her hemoglobin A1c put her in the prediabetic range which means if blood sugars is not properly controlled she at risk of developing diabetes down the road. Her cholesterol level is high, advise patient to continue atorvastatin at the current dose and will repeat cholesterol level in 3-6 months. Vitamin D level is very low, we'll need to replace with medication. To address this please limit saturated fat to no more than 7% of your calories, limit cholesterol to 200 mg/day, increase fiber and exercise as tolerated. If needed we may add another cholesterol lowering medication to your regimen.   Ergocalciferol 50,000 unit, one WEEKLY for 12 weeks. Prescription has been sent to the pharmacy.

## 2015-03-12 NOTE — Telephone Encounter (Signed)
Pt given lab results with instructions to monitor carbohydrate intake with diet control to prevent diabetes Pt instructed to start taking Vitamin D 50,00 units weekly x 12 weeks  Medication e-scribed to [facility]

## 2015-03-14 MED ORDER — OMEPRAZOLE 40 MG CAP, DELAYED RELEASE
40 mg | ORAL_CAPSULE | Freq: Every day | ORAL | Status: DC
Start: 2015-03-14 — End: 2016-10-30

## 2015-04-23 ENCOUNTER — Encounter: Payer: Self-pay | Admitting: Internal Medicine

## 2015-06-29 ENCOUNTER — Ambulatory Visit: Payer: Self-pay | Admitting: Internal Medicine

## 2015-07-03 ENCOUNTER — Telehealth: Payer: Self-pay | Admitting: Internal Medicine

## 2015-07-03 NOTE — Telephone Encounter (Signed)
[facility] called to notify PCP of patient no show to appointment.

## 2015-08-30 ENCOUNTER — Ambulatory Visit: Payer: Self-pay | Admitting: Internal Medicine

## 2015-10-22 ENCOUNTER — Encounter: Payer: Self-pay | Admitting: Internal Medicine

## 2015-10-22 ENCOUNTER — Ambulatory Visit: Payer: Self-pay | Attending: Internal Medicine | Admitting: Internal Medicine

## 2015-10-22 VITALS — BP 118/77 | HR 88 | Temp 98.1°F | Resp 18 | Ht 62.0 in | Wt 193.0 lb

## 2015-10-22 DIAGNOSIS — Z86718 Personal history of other venous thrombosis and embolism: Secondary | ICD-10-CM | POA: Insufficient documentation

## 2015-10-22 DIAGNOSIS — M25562 Pain in left knee: Secondary | ICD-10-CM

## 2015-10-22 DIAGNOSIS — M25462 Effusion, left knee: Secondary | ICD-10-CM

## 2015-10-22 DIAGNOSIS — Z79899 Other long term (current) drug therapy: Secondary | ICD-10-CM | POA: Insufficient documentation

## 2015-10-22 DIAGNOSIS — M7989 Other specified soft tissue disorders: Secondary | ICD-10-CM

## 2015-10-22 DIAGNOSIS — Z7901 Long term (current) use of anticoagulants: Secondary | ICD-10-CM | POA: Insufficient documentation

## 2015-10-22 DIAGNOSIS — E785 Hyperlipidemia, unspecified: Secondary | ICD-10-CM

## 2015-10-22 DIAGNOSIS — Z86711 Personal history of pulmonary embolism: Secondary | ICD-10-CM | POA: Insufficient documentation

## 2015-10-22 LAB — CBC WITH DIFFERENTIAL/PLATELET
BASOS PCT: 1 % (ref 0–1)
Basophils Absolute: 0.1 10*3/uL (ref 0.0–0.1)
EOS ABS: 0.1 10*3/uL (ref 0.0–0.7)
Eosinophils Relative: 2 % (ref 0–5)
HCT: 38.8 % (ref 36.0–46.0)
Hemoglobin: 12.9 g/dL (ref 12.0–15.0)
Lymphocytes Relative: 36 % (ref 12–46)
Lymphs Abs: 2.4 10*3/uL (ref 0.7–4.0)
MCH: 29.9 pg (ref 26.0–34.0)
MCHC: 33.2 g/dL (ref 30.0–36.0)
MCV: 89.8 fL (ref 78.0–100.0)
MONOS PCT: 8 % (ref 3–12)
MPV: 9.2 fL (ref 8.6–12.4)
Monocytes Absolute: 0.5 10*3/uL (ref 0.1–1.0)
NEUTROS ABS: 3.5 10*3/uL (ref 1.7–7.7)
NEUTROS PCT: 53 % (ref 43–77)
PLATELETS: 329 10*3/uL (ref 150–400)
RBC: 4.32 MIL/uL (ref 3.87–5.11)
RDW: 13.1 % (ref 11.5–15.5)
WBC: 6.6 10*3/uL (ref 4.0–10.5)

## 2015-10-22 LAB — COMPLETE METABOLIC PANEL WITH GFR
ALT: 15 U/L (ref 6–29)
AST: 15 U/L (ref 10–35)
Albumin: 3.9 g/dL (ref 3.6–5.1)
Alkaline Phosphatase: 123 U/L (ref 33–130)
BILIRUBIN TOTAL: 0.6 mg/dL (ref 0.2–1.2)
BUN: 10 mg/dL (ref 7–25)
CO2: 25 mmol/L (ref 20–31)
Calcium: 9.5 mg/dL (ref 8.6–10.4)
Chloride: 104 mmol/L (ref 98–110)
Creat: 0.99 mg/dL (ref 0.50–1.05)
GFR, EST NON AFRICAN AMERICAN: 63 mL/min (ref >=60)
GFR, Est African American: 73 mL/min (ref >=60)
GLUCOSE: 96 mg/dL (ref 65–99)
Potassium: 5.1 mmol/L (ref 3.5–5.3)
SODIUM: 139 mmol/L (ref 135–146)
TOTAL PROTEIN: 6.8 g/dL (ref 6.1–8.1)

## 2015-10-22 LAB — POCT GLYCOSYLATED HEMOGLOBIN (HGB A1C): HEMOGLOBIN A1C: 5.7

## 2015-10-22 LAB — TSH: TSH: 0.55 u[IU]/mL (ref 0.350–4.500)

## 2015-10-22 NOTE — Patient Instructions (Signed)

## 2015-10-22 NOTE — Progress Notes (Signed)
Patient here for FU HTN and Hand Pain  Patient complains of pain in her legs being present for a month. Patient has noticed swelling in both legs, more so in the left leg.  Patient complains of her hands and arms going numb and cold when she is not moving them and has them placed in a certain position.  Patient has taken her medications this morning.

## 2015-10-22 NOTE — Progress Notes (Signed)
Patient ID: JAXX AUD, female   DOB: 29-Jan-1956, 59 y.o.   MRN: [id number]   Tammy [name], is a 59 y.o. female  [id number]  [id number]  DOB - 1956-01-02  Chief Complaint  Patient presents with  . Follow-up        Subjective:   Tammy [name] is a 59 y.o. 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 Patient complains of pain in her legs being present for a month. Patient has noticed swelling in both legs, more so in the left leg. Patient complains of her hands and arms going numb and cold when she is not moving them and has them placed in a certain position. She also said that her upper limbs locks up occasionally when folded and have to be straightened by her son. She said her hands feels tight and swollen, and she showed a picture of her left lower leg swelling but resolved a few hours later. He has no fever, no night sweats, no history of fall or trauma. No known family history of autoimmune disorder, but there is extensive history of osteoarthritis and deformity. Patient has no history suggestive of thyroid disease. No bleeding from any orifice. Her medications are listed below. Patient has No headache, No chest pain, No abdominal pain - No Nausea, No new weakness tingling or numbness, No Cough - SOB. Patient claims she is not depressed, she denies any suicidal ideation or thoughts.   Problem  Arthralgia of Left Lower Leg  Swelling of Limb  Swelling of Left Knee Joint    ALLERGIES: No Known Allergies  PAST MEDICAL HISTORY: Past Medical History  Diagnosis Date  . DVT (deep venous thrombosis) ([place])   . Pulmonary embolism ([place])   . Seasonal allergies   . Chronic headaches   . Leukemia ([place])     Told she had it at age 46, given some shots    MEDICATIONS AT HOME: Prior to Admission medications   Medication Sig Start Date End Date Taking? Authorizing Provider  acetaminophen (TYLENOL) 500 MG tablet Take 1,000 mg by mouth  every 6 (six) hours as needed for pain.    Historical Provider, MD  amoxicillin-clavulanate (AUGMENTIN) 875-125 MG per tablet Take 1 tablet by mouth 2 (two) times daily. Patient not taking: Reported on 10/09/2014 02/21/14   Tresa Garter, MD  atorvastatin (LIPITOR) 40 MG tablet Take 1 tablet (40 mg total) by mouth daily. 03/08/15   Tresa Garter, MD  cetirizine-pseudoephedrine (ZYRTEC-D) 5-120 MG per tablet Take 1 tablet by mouth 2 (two) times daily. Patient not taking: Reported on 10/09/2014 02/09/14   Hazel Sams, PA-C  chlorpheniramine-HYDROcodone [facility]) 10-8 MG/5ML LQCR Take 5 mLs by mouth every 12 (twelve) hours. Take 5 mLs by mouth every 12 (twelve) hours. Patient not taking: Reported on 10/09/2014 02/09/14   Hazel Sams, PA-C  dicyclomine (BENTYL) 10 MG capsule Take 1 capsule (10 mg total) by mouth 4 (four) times daily -  before meals and at bedtime. 10/09/14   Tresa Garter, MD  guaiFENesin (MUCINEX) 600 MG 12 hr tablet Take 1 tablet (600 mg total) by mouth 2 (two) times daily. 02/09/14   Hazel Sams, PA-C  guaiFENesin-codeine 100-10 MG/5ML syrup Take 5 mLs by mouth every 4 (four) hours as needed for cough. Patient not taking: Reported on 10/09/2014 02/21/14   Tresa Garter, MD  Multiple Vitamins-Minerals (MULTIVITAMIN) tablet Take 1 tablet by mouth daily. 03/08/15   Darbi Chandran  Essie Christine, MD  ranitidine (ZANTAC) 150 MG tablet Take 1 tablet (150 mg total) by mouth 2 (two) times daily. 10/09/14   Tresa Garter, MD  Vitamin D, Ergocalciferol, (DRISDOL) 50000 UNITS CAPS capsule Take 1 capsule (50,000 Units total) by mouth every 7 (seven) days. 03/12/15   Smt Lokey Essie Christine, MD  XARELTO 20 MG TABS tablet Take 1 tablet (20 mg total) by mouth daily with supper. 03/08/15   Tresa Garter, MD     Objective:   Filed Vitals:   10/22/15 1054  BP: 118/77  Pulse: 88  Temp: 98.1 F (36.7 C)  TempSrc: Oral  Resp: 18  Height: 5\' 2"  (1.575 m)  Weight: 193 lb  (87.544 kg)  SpO2: 96%    Exam General appearance : Awake, alert, not in any distress. Speech Clear. Not toxic looking HEENT: Atraumatic and Normocephalic, pupils equally reactive to light and accomodation Neck: supple, no JVD. No cervical lymphadenopathy.  Chest:Good air entry bilaterally, no added sounds  CVS: S1 S2 regular, no murmurs.  Abdomen: Bowel sounds present, Non tender and not distended with no gaurding, rigidity or rebound. Extremities: Slightly swollen and tight fingers, dry.  B/L Lower Ext shows no edema, both legs are warm to touch Neurology: Awake alert, and oriented X 3, CN II-XII intact, Non focal Skin: No Rash  Data Review Lab Results  Component Value Date   HGBA1C 5.90 03/08/2015   HGBA1C  07/04/2007    5.7 (NOTE)   The ADA recommends the following therapeutic goals for glycemic   control related to Hgb A1C measurement:   Goal of Therapy:   < 7.0% Hgb A1C   Action Suggested:  > 8.0% Hgb A1C   Ref:  Diabetes Care, 22, Suppl. 1, 1999     Assessment & Plan   1. Swelling of limb  - POCT A1C - CBC with Differential/Platelet - COMPLETE METABOLIC PANEL WITH GFR - Echocardiogram; Future  2. Dyslipidemia  To address this please limit saturated fat to no more than 7% of your calories, limit cholesterol to 200 mg/day, increase fiber and exercise as tolerated. If needed we may add another cholesterol lowering medication to your regimen.   3. Arthralgia of left lower leg  - ANA - RNP Antibody - Cyclic Citrul Peptide Antibody, IGG - TSH  4. Swelling of left knee joint  - ANA - RNP Antibody - Cyclic Citrul Peptide Antibody, IGG - TSH  Patient have been counseled extensively about nutrition and exercise  Return in about 3 months (around 01/20/2016) for Follow up Pain and comorbidities, Pulmonary Embolism and Anticoagulation.  The patient was given clear instructions to go to ER or return to medical center if symptoms don't improve, worsen or new problems  develop. The patient verbalized understanding. The patient was told to call to get lab results if they haven't heard anything in the next week.   This note has been created with Surveyor, quantity. Any transcriptional errors are unintentional.    Angelica Chessman, MD, [place], Karilyn [place], [place] and [place], [place]   10/22/2015, 11:37 AM

## 2015-10-23 LAB — ANTI-NUCLEAR AB-TITER (ANA TITER)

## 2015-10-23 LAB — CYCLIC CITRUL PEPTIDE ANTIBODY, IGG

## 2015-10-23 LAB — ANA: Anti Nuclear Antibody(ANA): POSITIVE — AB

## 2015-10-23 LAB — RNP ANTIBODY: Ribonucleic Protein(ENA) Antibody, IgG: <1

## 2015-10-24 ENCOUNTER — Ambulatory Visit (HOSPITAL_COMMUNITY)
Admission: RE | Admit: 2015-10-24 | Discharge: 2015-10-24 | Disposition: A | Discharge status: Home / Self Care | Payer: Self-pay | Source: Ambulatory Visit | Attending: Internal Medicine | Admitting: Internal Medicine

## 2015-10-24 DIAGNOSIS — I5189 Other ill-defined heart diseases: Secondary | ICD-10-CM | POA: Insufficient documentation

## 2015-10-24 DIAGNOSIS — I340 Nonrheumatic mitral (valve) insufficiency: Secondary | ICD-10-CM | POA: Insufficient documentation

## 2015-10-24 DIAGNOSIS — I071 Rheumatic tricuspid insufficiency: Secondary | ICD-10-CM | POA: Insufficient documentation

## 2015-10-24 DIAGNOSIS — R0600 Dyspnea, unspecified: Secondary | ICD-10-CM | POA: Insufficient documentation

## 2015-10-24 DIAGNOSIS — E785 Hyperlipidemia, unspecified: Secondary | ICD-10-CM | POA: Insufficient documentation

## 2015-10-24 DIAGNOSIS — M7989 Other specified soft tissue disorders: Secondary | ICD-10-CM | POA: Insufficient documentation

## 2015-10-24 NOTE — Progress Notes (Deleted)
  Echocardiogram 2D Echocardiogram has been performed.  Tammy [name] 10/24/2015, 4:45 PM

## 2015-10-24 NOTE — Progress Notes (Signed)
  Echocardiogram 2D Echocardiogram has been performed.  Tammy [name] 10/24/2015, 4:43 PM

## 2015-10-26 ENCOUNTER — Telehealth: Payer: Self-pay | Admitting: *Deleted

## 2015-10-26 NOTE — Telephone Encounter (Signed)
-----   Message from Tresa Garter, MD sent at 10/24/2015  6:59 PM EST ----- Please inform patient that her heart ultrasound is normal

## 2015-10-26 NOTE — Telephone Encounter (Signed)
Patient verified DOB Patient informed of her lab results being mostly normal. Medical Assistant informed patient of one of her test being positive. Patient advised to schedule an appointment within January to discuss the results with the doctor. Patient advised to call back on Tuesday to schedule an appointment. Patient expressed her understanding and had no further questions.

## 2015-10-26 NOTE — Telephone Encounter (Signed)
-----   Message from Tresa Garter, MD sent at 10/24/2015  7:01 PM EST ----- Please inform patient about her laboratory results were mostly normal. However, one of the tests that came back positive needs further testing. We will schedule appointment to discuss this result, not urgent.  Please advise patient to make an appointment in the next 4 weeks

## 2015-10-26 NOTE — Telephone Encounter (Signed)
Patient verified DOB Patient informed of heart ultrasound being normal. Patient expressed her understanding and had no further questions.

## 2015-11-22 ENCOUNTER — Telehealth: Payer: Self-pay | Admitting: Internal Medicine

## 2015-11-22 ENCOUNTER — Ambulatory Visit: Payer: Self-pay | Admitting: Internal Medicine

## 2015-11-22 NOTE — Telephone Encounter (Signed)
Pt. Called requesting to speak to nurse. Pt. Stated that her hands cramp up in the nights for about and hour, and stated that it hurts really bad. Please f/u with pt.

## 2015-11-23 NOTE — Telephone Encounter (Signed)
Medical Assistant left message on patient's home and cell voicemail. Voicemail states to give a call back to Rosibel Giacobbe with [facility] at [phone].  

## 2015-12-13 MED FILL — XARELTO 20 MG TABLET: 20 | 30 days supply | Qty: 30 | Fill #1

## 2015-12-24 ENCOUNTER — Encounter: Payer: Self-pay | Admitting: Internal Medicine

## 2015-12-24 ENCOUNTER — Ambulatory Visit: Payer: Self-pay | Attending: Internal Medicine | Admitting: Internal Medicine

## 2015-12-24 VITALS — BP 112/73 | HR 87 | Temp 98.3°F | Resp 18 | Ht 62.0 in | Wt 193.4 lb

## 2015-12-24 DIAGNOSIS — F411 Generalized anxiety disorder: Secondary | ICD-10-CM | POA: Insufficient documentation

## 2015-12-24 DIAGNOSIS — Z79899 Other long term (current) drug therapy: Secondary | ICD-10-CM | POA: Insufficient documentation

## 2015-12-24 DIAGNOSIS — M25559 Pain in unspecified hip: Secondary | ICD-10-CM | POA: Insufficient documentation

## 2015-12-24 DIAGNOSIS — Z7901 Long term (current) use of anticoagulants: Secondary | ICD-10-CM | POA: Insufficient documentation

## 2015-12-24 DIAGNOSIS — Z86711 Personal history of pulmonary embolism: Secondary | ICD-10-CM | POA: Insufficient documentation

## 2015-12-24 DIAGNOSIS — Z86718 Personal history of other venous thrombosis and embolism: Secondary | ICD-10-CM | POA: Insufficient documentation

## 2015-12-24 DIAGNOSIS — R768 Other specified abnormal immunological findings in serum: Secondary | ICD-10-CM | POA: Insufficient documentation

## 2015-12-24 DIAGNOSIS — E785 Hyperlipidemia, unspecified: Secondary | ICD-10-CM | POA: Insufficient documentation

## 2015-12-24 DIAGNOSIS — M3500 Sicca syndrome, unspecified: Secondary | ICD-10-CM | POA: Insufficient documentation

## 2015-12-24 DIAGNOSIS — M25551 Pain in right hip: Secondary | ICD-10-CM

## 2015-12-24 LAB — CBC WITH DIFFERENTIAL/PLATELET
Basophils Absolute: 0.1 10*3/uL (ref 0.0–0.1)
Basophils Relative: 1 % (ref 0–1)
Eosinophils Absolute: 0.1 10*3/uL (ref 0.0–0.7)
Eosinophils Relative: 2 % (ref 0–5)
HCT: 40.5 % (ref 36.0–46.0)
HEMOGLOBIN: 13.2 g/dL (ref 12.0–15.0)
LYMPHS ABS: 2.3 10*3/uL (ref 0.7–4.0)
Lymphocytes Relative: 32 % (ref 12–46)
MCH: 29.1 pg (ref 26.0–34.0)
MCHC: 32.6 g/dL (ref 30.0–36.0)
MCV: 89.4 fL (ref 78.0–100.0)
MONO ABS: 0.6 10*3/uL (ref 0.1–1.0)
MONOS PCT: 8 % (ref 3–12)
MPV: 9.5 fL (ref 8.6–12.4)
NEUTROS ABS: 4.1 10*3/uL (ref 1.7–7.7)
NEUTROS PCT: 57 % (ref 43–77)
Platelets: 403 10*3/uL — ABNORMAL HIGH (ref 150–400)
RBC: 4.53 MIL/uL (ref 3.87–5.11)
RDW: 13.6 % (ref 11.5–15.5)
WBC: 7.2 10*3/uL (ref 4.0–10.5)

## 2015-12-24 LAB — LIPID PANEL
CHOL/HDL RATIO: 7.2 ratio — AB (ref <=5.0)
Cholesterol: 258 mg/dL — ABNORMAL HIGH (ref 125–200)
HDL: 36 mg/dL — AB (ref >=46)
LDL CALC: 148 mg/dL — AB (ref <130)
Triglycerides: 370 mg/dL — ABNORMAL HIGH (ref <150)
VLDL: 74 mg/dL — ABNORMAL HIGH (ref <30)

## 2015-12-24 MED ORDER — HYDROXYZINE HCL 25 MG PO TABS: 25.0000 mg | ORAL_TABLET | Freq: Three times a day (TID) | ORAL | Status: DC | PRN

## 2015-12-24 MED FILL — hydrOXYzine HCL 25 MG TABS: 25 | 10 days supply | Qty: 30 | Fill #0

## 2015-12-24 MED FILL — ATORVASTATIN 40 MG TABLET: 40 | 30 days supply | Qty: 30 | Fill #1

## 2015-12-24 NOTE — Progress Notes (Signed)
Patient is here to FU with PCP for results.  Patient denies pain at this time.  Patient declined flu shot today.

## 2015-12-24 NOTE — Patient Instructions (Signed)
Dyslipidemia Dyslipidemia is an imbalance of the lipids in your blood. Lipids are waxy, fat-like proteins that your body needs in small amounts. Dyslipidemia often involves the lipids cholesterol or triglycerides. Common forms of dyslipidemia are:  High levels of bad cholesterol (LDL cholesterol). LDL cholesterol is the type of cholesterol that causes heart disease.  Low levels of good cholesterol (HDL cholesterol). HDL cholesterol is the type of cholesterol that helps protect against heart disease.  High levels of triglycerides. Triglycerides are a fatty substance in the blood linked to a buildup of plaque on your arteries. RISK FACTORS  Increased age.  Having a family history of high cholesterol.  Certain medicines, including birth control pills, diuretics, beta-blockers, and some medicines for depression.  Smoking.  Eating a high-fat diet.  Being overweight.  Medical conditions such as diabetes, polycystic ovary syndrome, pregnancy, kidney disease, and hypothyroidism.  Lack of regular exercise. SIGNS AND SYMPTOMS There are no signs or symptoms with dyslipidemia. DIAGNOSIS A simple blood test called a fasting blood test can be done to determine your level of:  Total cholesterol. This is the combined number of LDL cholesterol and HDL cholesterol. A healthy number is lower than 200.  LDL cholesterol. The goal number for LDL cholesterol is different for each person depending on risk factors. Ask your health care provider what your LDL cholesterol number should be.  HDL cholesterol. A healthy level of HDL cholesterol is 60 or higher. A number lower than 40 for men or 50 for women is a danger sign.  Triglycerides. A healthy triglyceride number is less than 150. TREATMENT Dyslipidemia is a treatable condition. Your health care provider will advise you on what type of treatment is best based on your age, your test results, and current guidelines. Treatment may include:  Dietary  changes. A dietitian may help you create a diet that is based on your risk factors, conditions, and lifestyle.  Regular exercise. This can help lower your LDL cholesterol, raise your HDL cholesterol, and help with weight management. Check with your health care provider before beginning an exercise program. Most people should participate in 30 minutes of brisk exercise 5 days a week.  Quitting smoking.  Medicines to lower LDL cholesterol and triglycerides.  If you have high levels of triglycerides, your health care provider may:  Have you stop drinking alcohol.  Have you restrict your fat intake.  Have you eliminate refined sugars from your diet.  Treat you for other conditions, such as underactive thyroid gland (hypothyroidism) and high blood sugar (hyperglycemia). Your health care provider will monitor your lipid levels with regular blood tests. HOME CARE INSTRUCTIONS  Eat a healthy diet. Follow any diet instructions if they were given to you by your health care provider.  Maintain a healthy weight.  Exercise regularly based on the recommendations of your health care provider.  Do not use any tobacco products, including cigarettes, chewing tobacco, or electronic cigarettes.  Take medicines only as directed by your health care provider.  Keep all follow-up visits as directed by your health care provider. SEEK MEDICAL CARE IF: You are having possible side effects from your medicines.   This information is not intended to replace advice given to you by your health care provider. Make sure you discuss any questions you have with your health care provider.   Document Released: 10/25/2013 Document Revised: 11/10/2014 Document Reviewed: 10/25/2013 Elsevier Interactive Patient Education 2016 [employer] Generalized Anxiety Disorder Generalized anxiety disorder (GAD) is a mental disorder. It interferes with  life functions, including relationships, work, and school. GAD is different  from normal anxiety, which everyone experiences at some point in their lives in response to specific life events and activities. Normal anxiety actually helps [place] prepare for and get through these life events and activities. Normal anxiety goes away after the event or activity is over.  GAD causes anxiety that is not necessarily related to specific events or activities. It also causes excess anxiety in proportion to specific events or activities. The anxiety associated with GAD is also difficult to control. GAD can vary from mild to severe. People with severe GAD can have intense waves of anxiety with physical symptoms (panic attacks).  SYMPTOMS The anxiety and worry associated with GAD are difficult to control. This anxiety and worry are related to many life events and activities and also occur more days than not for 6 months or longer. People with GAD also have three or more of the following symptoms (one or more in children):  Restlessness.   Fatigue.  Difficulty concentrating.   Irritability.  Muscle tension.  Difficulty sleeping or unsatisfying sleep. DIAGNOSIS GAD is diagnosed through an assessment by your health care provider. Your health care provider will ask you questions aboutyour mood,physical symptoms, and events in your life. Your health care provider may ask you about your medical history and use of alcohol or drugs, including prescription medicines. Your health care provider may also do a physical exam and blood tests. Certain medical conditions and the use of certain substances can cause symptoms similar to those associated with GAD. Your health care provider may refer you to a mental health specialist for further evaluation. TREATMENT The following therapies are usually used to treat GAD:   Medication. Antidepressant medication usually is prescribed for long-term daily control. Antianxiety medicines may be added in severe cases, especially when panic attacks occur.   Talk  therapy (psychotherapy). Certain types of talk therapy can be helpful in treating GAD by providing support, education, and guidance. A form of talk therapy called cognitive behavioral therapy can teach you healthy ways to think about and react to daily life events and activities.  Stress managementtechniques. These include yoga, meditation, and exercise and can be very helpful when they are practiced regularly. A mental health specialist can help determine which treatment is best for you. Some people see improvement with one therapy. However, other people require a combination of therapies.   This information is not intended to replace advice given to you by your health care provider. Make sure you discuss any questions you have with your health care provider.   Document Released: 02/14/2013 Document Revised: 11/10/2014 Document Reviewed: 02/14/2013 Elsevier Interactive Patient Education [employer].

## 2015-12-24 NOTE — Progress Notes (Signed)
Patient ID: Tammy [name], female   DOB: 01-10-1956, 60 y.o.   MRN: [id number]   Tammy [name], is a 60 y.o. female  [id number]  [id number]  DOB - November 27, 1955  Chief Complaint  Patient presents with  . Follow-up    Results        Subjective:   Tammy [name] is a 60 y.o. 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 Patient was seen here in December, her evaluation revealed positive ANA, she was called for further testing. She is here today for more discussions on the result and for testing. She has no complaints. She continues to have on-and-off pain in her joints especially at night. She has no swelling or redness. No history of injury. No family history of autoimmune disorder. Patient has No headache, No chest pain, No abdominal pain - No Nausea, No new weakness tingling or numbness, No Cough - SOB.  Problem  Positive Ana (Antinuclear Antibody)    ALLERGIES: No Known Allergies  PAST MEDICAL HISTORY: Past Medical History  Diagnosis Date  . DVT (deep venous thrombosis) ([place])   . Pulmonary embolism ([place])   . Seasonal allergies   . Chronic headaches   . Leukemia ([place])     Told she had it at age 23, given some shots    MEDICATIONS AT HOME: Prior to Admission medications   Medication Sig Start Date End Date Taking? Authorizing Provider  atorvastatin (LIPITOR) 40 MG tablet Take 1 tablet (40 mg total) by mouth daily. 03/08/15  Yes Tresa Garter, MD  ranitidine (ZANTAC) 150 MG tablet Take 1 tablet (150 mg total) by mouth 2 (two) times daily. 10/09/14  Yes Jacques Fife E Doreene Burke, MD  XARELTO 20 MG TABS tablet Take 1 tablet (20 mg total) by mouth daily with supper. 03/08/15  Yes Tresa Garter, MD  dicyclomine (BENTYL) 10 MG capsule Take 1 capsule (10 mg total) by mouth 4 (four) times daily -  before meals and at bedtime. Patient not taking: Reported on 12/24/2015 10/09/14   Tresa Garter, MD  Multiple Vitamins-Minerals  (MULTIVITAMIN) tablet Take 1 tablet by mouth daily. Patient not taking: Reported on 12/24/2015 03/08/15   Tresa Garter, MD  Vitamin D, Ergocalciferol, (DRISDOL) 50000 UNITS CAPS capsule Take 1 capsule (50,000 Units total) by mouth every 7 (seven) days. Patient not taking: Reported on 12/24/2015 03/12/15   Tresa Garter, MD     Objective:   Filed Vitals:   12/24/15 1045  BP: 112/73  Pulse: 87  Temp: 98.3 F (36.8 C)  TempSrc: Oral  Resp: 18  Height: 5\' 2"  (1.575 m)  Weight: 193 lb 6.4 oz (87.726 kg)  SpO2: 97%    Exam General appearance : Awake, alert, not in any distress. Speech Clear. Not toxic looking HEENT: Atraumatic and Normocephalic, pupils equally reactive to light and accomodation Neck: supple, no JVD. No cervical lymphadenopathy.  Chest:Good air entry bilaterally, no added sounds  CVS: S1 S2 regular, no murmurs.  Abdomen: Bowel sounds present, Non tender and not distended with no gaurding, rigidity or rebound. Extremities: B/L Lower Ext shows no edema, both legs are warm to touch Neurology: Awake alert, and oriented X 3, CN II-XII intact, Non focal Skin:No Rash  Data Review Lab Results  Component Value Date   HGBA1C 5.70 10/22/2015   HGBA1C 5.90 03/08/2015   HGBA1C  07/04/2007    5.7 (NOTE)   The ADA recommends the following therapeutic goals  for glycemic   control related to Hgb A1C measurement:   Goal of Therapy:   < 7.0% Hgb A1C   Action Suggested:  > 8.0% Hgb A1C   Ref:  Diabetes Care, 22, Suppl. 1, 1999     Assessment & Plan   1. Dyslipidemia  - CBC with Differential/Platelet - Lipid panel  2. Hip pain, right  Continue pain medication  3. Positive ANA (antinuclear antibody)  - Anti-DNA antibody, double-stranded - Sjogren's syndrome antibods(ssa + ssb) - Sedimentation Rate  Referral to rheumatologist if any of this is positive.  Patient have been counseled extensively about nutrition and exercise  Return in about 3 months (around  03/22/2016) for Follow up Pain and comorbidities, Generalized Anxiety Disorder.  The patient was given clear instructions to go to ER or return to medical center if symptoms don't improve, worsen or new problems develop. The patient verbalized understanding. The patient was told to call to get lab results if they haven't heard anything in the next week.   This note has been created with Surveyor, quantity. Any transcriptional errors are unintentional.    Angelica Chessman, MD, [place], [place], [place], [place] and [facility] [place], [place]   12/24/2015, 11:02 AM

## 2015-12-25 LAB — ANTI-DNA ANTIBODY, DOUBLE-STRANDED: ds DNA Ab: 1 IU/mL

## 2015-12-25 LAB — SEDIMENTATION RATE: Sed Rate: 55 mm/hr — ABNORMAL HIGH (ref 0–30)

## 2015-12-25 LAB — SJOGREN'S SYNDROME ANTIBODS(SSA + SSB)
SSA (RO) (ENA) ANTIBODY, IGG: NEGATIVE
SSB (La) (ENA) Antibody, IgG: <1

## 2015-12-27 ENCOUNTER — Telehealth: Payer: Self-pay | Admitting: *Deleted

## 2015-12-27 NOTE — Telephone Encounter (Signed)
-----   Message from Tresa Garter, MD sent at 12/26/2015  4:30 PM EST ----- Please inform patient that her laboratory test results to check for autoimmune disorders are negative. If joint pains and swelling persist, we will refer her to rheumatologist. Her cholesterol is very high, encouraged patient to continue taking her atorvastatin as prescribed and also please limit saturated fat to no more than 7% of your calories, limit cholesterol to 200 mg/day, increase fiber and exercise as tolerated. If needed we may add another cholesterol lowering medication to your regimen.

## 2015-12-27 NOTE — Telephone Encounter (Signed)
Patient verified DOB Patient is aware of autoimmune disorder results being negative. Patient advised to contact office if swelling or joint pain persist. Patient will be referred to Rheumatology.  Patient informed of her cholesterol being very high. Patient encouraged to continue taking atorvastatin as prescribed. Patient advised to limit saturated fats and increase exercise and fiber. Patient informed of cholesterol medication being added to her regimen if lifestyle changes do not show significant improvement. Patient expressed her understanding and had no further questions at this time.

## 2015-12-27 NOTE — Telephone Encounter (Signed)
Medical Assistant left message on patient's home and cell voicemail. Voicemail states to give a call back to Kaiden Pech with [facility] at [phone].  

## 2015-12-31 ENCOUNTER — Ambulatory Visit: Payer: Self-pay

## 2016-01-15 MED FILL — XARELTO 20 MG TABLET: 20 | 30 days supply | Qty: 30 | Fill #2

## 2016-02-22 MED FILL — XARELTO 20 MG TABLET: 20 | 30 days supply | Qty: 30 | Fill #3

## 2016-04-01 ENCOUNTER — Other Ambulatory Visit: Payer: Self-pay | Admitting: Internal Medicine

## 2016-04-10 ENCOUNTER — Telehealth: Payer: Self-pay | Admitting: Hematology and Oncology

## 2016-04-10 NOTE — Telephone Encounter (Signed)
pt called and confirmed appt....pt ok and aware

## 2016-04-10 NOTE — Telephone Encounter (Signed)
Returned call and lvm for pt with new appts.Marland KitchenMarland Kitchen

## 2016-04-22 ENCOUNTER — Ambulatory Visit (HOSPITAL_BASED_OUTPATIENT_CLINIC_OR_DEPARTMENT_OTHER): Payer: Self-pay | Admitting: Hematology and Oncology

## 2016-04-22 ENCOUNTER — Encounter: Payer: Self-pay | Admitting: Hematology and Oncology

## 2016-04-22 VITALS — BP 115/68 | HR 84 | Temp 98.3°F | Resp 18 | Ht 62.0 in | Wt 197.0 lb

## 2016-04-22 DIAGNOSIS — Z7901 Long term (current) use of anticoagulants: Secondary | ICD-10-CM

## 2016-04-22 DIAGNOSIS — I825Y9 Chronic embolism and thrombosis of unspecified deep veins of unspecified proximal lower extremity: Secondary | ICD-10-CM

## 2016-04-22 DIAGNOSIS — Z86718 Personal history of other venous thrombosis and embolism: Secondary | ICD-10-CM

## 2016-04-22 DIAGNOSIS — R768 Other specified abnormal immunological findings in serum: Secondary | ICD-10-CM

## 2016-04-22 NOTE — Progress Notes (Signed)
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 According to the patient, she was first diagnosed with blood clot in 2008. According to the patient it was unprovoked. The patient complained of shortness of breath and cough and chest discomfort. She was smoking at that time. She had imaging study of the lung done which show evidence of PE. According to the patient she was on a blood thinner for 2 years. The date of the CT scan was 06/24/2007. In 2014, she started to complain of intermittent sharp discomfort on the right leg associated with some mild swelling. She states that she had pain on the left leg as well but is not as severe compared to the right leg. She went to the emergency department. Results of the ultrasound venous Doppler came back consistent with acute deep vein thrombosis involving the posterior tibial and perioneal vein of the right lower extremity. D-dimer was elevated. She was placed on Lovenox and transition to warfarin 5 mg daily. Her INR has been supratherapeutic requiring vitamin K and multiple dose interruption to keep her INR at the desire range at 2-3. She denies any bleeding complications such as spontaneous epistaxis, hematuria, hematochezia, or the need for blood transfusions. She had mild intermittent chest discomfort throughout this and had imaging study done which show possible new clot in the left lung on the CT scan from September 2014. She has been pregnant 4 times including C-section for her pregnancy and never developed any peripartum blood clots. There were no family history of blood clots. In October 2014, I made a decision to change her to Xarelto. INTERVAL HISTORY: Tammy [name] 60 y.o. female returns for follow-up. She is doing well on treatment. The patient denies any recent signs or symptoms of bleeding such as spontaneous epistaxis,  hematuria or hematochezia. She continues to have intermittent aches and pain in her legs and occasional leg swelling. She is asking for assistance to fill a form to help her pay for her medication today. She also complained of diffuse joint discomfort and has undergone some workup with primary care doctor.  I have reviewed the past medical history, past surgical history, social history and family history with the patient and they are unchanged from previous note.  ALLERGIES:  has No Known Allergies.  MEDICATIONS:  Current Outpatient Prescriptions  Medication Sig Dispense Refill  . atorvastatin (LIPITOR) 40 MG tablet Take 1 tablet (40 mg total) by mouth daily. 90 tablet 3  . hydrOXYzine (ATARAX/VISTARIL) 25 MG tablet Take 1 tablet (25 mg total) by mouth 3 (three) times daily as needed for anxiety. 30 tablet 3  . XARELTO 20 MG TABS tablet TAKE 1 TABLET BY MOUTH ONCE DAILY WITH SUPPER 90 tablet 0   No current facility-administered medications for this visit.     REVIEW OF SYSTEMS:   Constitutional: Denies fevers, chills or night sweats Eyes: Denies blurriness of vision Ears, nose, mouth, throat, and face: Denies mucositis or sore throat Respiratory: Denies cough, dyspnea or wheezes Gastrointestinal:  Denies nausea, heartburn or change in bowel habits Skin: Denies abnormal skin rashes Lymphatics: Denies new lymphadenopathy or easy bruising Neurological:Denies numbness, tingling or new weaknesses Behavioral/Psych: Mood is stable, no new changes  All other systems were reviewed with the patient and are negative.  PHYSICAL EXAMINATION: ECOG PERFORMANCE STATUS: 1 - Symptomatic but completely ambulatory  Filed Vitals:   04/22/16 1219  BP: 115/68  Pulse: 84  Temp: 98.3 F (36.8 C)  Resp: 18   Filed Weights   04/22/16 1219  Weight: 197 lb (89.359 kg)    GENERAL:alert, no distress and comfortable. She is morbidly obese SKIN: skin color, texture, turgor are normal, no rashes or  significant lesions EYES: normal, Conjunctiva are pink and non-injected, sclera clear OROPHARYNX:no exudate, no erythema and lips, buccal mucosa, and tongue normal  NECK: supple, thyroid normal size, non-tender, without nodularity LYMPH:  no palpable lymphadenopathy in the cervical, axillary or inguinal LUNGS: clear to auscultation and percussion with normal breathing effort HEART: regular rate & rhythm and no murmurs and no lower extremity edema. Mild varicose veins is noted ABDOMEN:abdomen soft, non-tender and normal bowel sounds Musculoskeletal:no cyanosis of digits and no clubbing  NEURO: alert & oriented x 3 with fluent speech, no focal motor/sensory deficits  LABORATORY DATA:  I have reviewed the data as listed     Component Value Date/Time   NA 139 10/22/2015 1159   NA 141 08/11/2013 1046   K 5.1 10/22/2015 1159   K 4.3 08/11/2013 1046   CL 104 10/22/2015 1159   CO2 25 10/22/2015 1159   CO2 27 08/11/2013 1046   GLUCOSE 96 10/22/2015 1159   GLUCOSE 102 08/11/2013 1046   BUN 10 10/22/2015 1159   BUN 10.5 08/11/2013 1046   CREATININE 0.99 10/22/2015 1159   CREATININE 1.0 08/11/2013 1046   CREATININE 1.00 09/02/2012 1442   CALCIUM 9.5 10/22/2015 1159   CALCIUM 9.5 08/11/2013 1046   PROT 6.8 10/22/2015 1159   PROT 7.3 08/11/2013 1046   ALBUMIN 3.9 10/22/2015 1159   ALBUMIN 3.4* 08/11/2013 1046   AST 15 10/22/2015 1159   AST 15 08/11/2013 1046   ALT 15 10/22/2015 1159   ALT 13 08/11/2013 1046   ALKPHOS 123 10/22/2015 1159   ALKPHOS 112 08/11/2013 1046   BILITOT 0.6 10/22/2015 1159   BILITOT 0.73 08/11/2013 1046   GFRNONAA 63 10/22/2015 1159   GFRNONAA 63* 09/02/2012 1442   GFRAA 73 10/22/2015 1159   GFRAA 73* 09/02/2012 1442    No results found for: SPEP, UPEP  Lab Results  Component Value Date   WBC 7.2 12/24/2015   NEUTROABS 4.1 12/24/2015   HGB 13.2 12/24/2015   HCT 40.5 12/24/2015   MCV 89.4 12/24/2015   PLT 403* 12/24/2015      Chemistry       Component Value Date/Time   NA 139 10/22/2015 1159   NA 141 08/11/2013 1046   K 5.1 10/22/2015 1159   K 4.3 08/11/2013 1046   CL 104 10/22/2015 1159   CO2 25 10/22/2015 1159   CO2 27 08/11/2013 1046   BUN 10 10/22/2015 1159   BUN 10.5 08/11/2013 1046   CREATININE 0.99 10/22/2015 1159   CREATININE 1.0 08/11/2013 1046   CREATININE 1.00 09/02/2012 1442      Component Value Date/Time   CALCIUM 9.5 10/22/2015 1159   CALCIUM 9.5 08/11/2013 1046   ALKPHOS 123 10/22/2015 1159   ALKPHOS 112 08/11/2013 1046   AST 15 10/22/2015 1159   AST 15 08/11/2013 1046   ALT 15 10/22/2015 1159   ALT 13 08/11/2013 1046   BILITOT 0.6 10/22/2015 1159   BILITOT 0.73 08/11/2013 1046      ASSESSMENT & PLAN:  DVT (deep venous thrombosis) She is doing with very well since her treatment was switched to Xarelto. She has no complications from treatment. Goal of treatment is lifelong. I will discharge her from this  clinic and recommend PCP follow-up. While on Xarelto, she would need minimum twice a year history, physical examination and blood work. I help her to fill out a patient assistant program with the company to get help to pay full Xarelto  Positive ANA (antinuclear antibody) She was noted to have positive ANA and has a lot of symptoms of joint discomfort. I would defer to primary care doctor for further management.   All questions were answered. The patient knows to call the clinic with any problems, questions or concerns. No barriers to learning was detected.  I spent 15 minutes counseling the patient face to face. The total time spent in the appointment was 20 minutes and more than 50% was on counseling.     Colm Lyford, MD 6/20/20171:37 PM

## 2016-04-22 NOTE — Assessment & Plan Note (Signed)
She is doing with very well since her treatment was switched to Xarelto. She has no complications from treatment. Goal of treatment is lifelong. I will discharge her from this clinic and recommend PCP follow-up. While on Xarelto, she would need minimum twice a year history, physical examination and blood work. I help her to fill out a patient assistant program with the company to get help to pay full Xarelto

## 2016-04-22 NOTE — Assessment & Plan Note (Signed)
She was noted to have positive ANA and has a lot of symptoms of joint discomfort. I would defer to primary care doctor for further management.

## 2016-06-20 ENCOUNTER — Ambulatory Visit: Attending: Family Medicine | Primary: Family Medicine

## 2016-06-20 ENCOUNTER — Ambulatory Visit: Admit: 2016-06-20 | Payer: PRIVATE HEALTH INSURANCE | Attending: Family Medicine | Primary: Family Medicine

## 2016-06-20 DIAGNOSIS — R413 Other amnesia: Secondary | ICD-10-CM

## 2016-06-20 NOTE — Progress Notes (Signed)
Chief Complaint   Patient presents with   ??? Memory Loss       Referred by: Dr. Luiz Ochoa      HPI    Vanessa [name] is a 60 year old woman with hypertension.  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 she is referred for memory loss.  She tells me that she feels like her memory has been rather poor quality since December 31, 2011 or 2012/12/30 after her mother passed away.  She had an MRI done at that time which was overall benign. She is here because her work has now placed her on a 30 day leave of absence because she has been performing poorly at work.  She presented information of an evaluation done by her manager showing poor accounting practices, over payments  And failure to complete work requirements in a 40 hour work week.  The patient tells me she has very poor concentration.  She has a write notes down for everything she does.  She finds herself forgetting her notes.  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  She feels that in the past 14 months her memory has worsened.  This coincides with her beginning her current position.  Her sleep is very poor quality.  When she gets home she feels like she is ready to go to sleep.  She has seen mental health in the past for medication management and continues to take Cymbalta.  Her psychiatrist is no longer seeing patients.  She sees a psychologist who has completed her FMLA paperwork.  She denies any speech or vision changes.  She has some myalgias on the right.  ADLs intact.    Review of Systems   Musculoskeletal: Positive for myalgias.   Psychiatric/Behavioral: Positive for depression and memory loss. The patient has insomnia.    All other systems reviewed and are negative.      Past Medical History:   Diagnosis Date   ??? Depression    ??? H/O colonoscopy 09-24-12    diverticulosis   ??? Hepatic steatosis    ??? Hypertension    ??? Normal cardiac stress test 02-10-12   ??? Pneumonia      Family History   Problem Relation Age of Onset    ??? Heart Disease Mother    ??? Heart Disease Father      Social History     Social History   ??? Marital status: MARRIED     Spouse name: N/A   ??? Number of children: N/A   ??? Years of education: N/A     Occupational History   ??? Not on file.     Social History Main Topics   ??? Smoking status: Never Smoker   ??? Smokeless tobacco: Never Used   ??? Alcohol use No   ??? Drug use: No   ??? Sexual activity: Yes     Partners: Male     Birth control/ protection: None     Other Topics Concern   ??? Not on file     Social History Narrative     Current Outpatient Prescriptions   Medication Sig   ??? minoxidil (ROGAINE) 2 % external solution Apply  to affected area two (2) times a day. Apply to scalp and rub it in   ??? DULoxetine (CYMBALTA) 30 mg capsule Take 30 mg by mouth daily.   ??? VAYARIN 75-8.5-21.5 mg cap Take 1 Cap by mouth two (2) times a day.   ??? valsartan-hydrochlorothiazide (  DIOVAN-HCT) 160-12.5 mg per tablet Take 1 Tab by mouth daily.   ??? vit B Cmplx 3-FA-Vit C-Biotin (NEPHRO VITE RX) 1-60-300 mg-mg-mcg tablet Take 1 Tab by mouth daily.   ??? omeprazole (PRILOSEC) 40 mg capsule Take 1 Cap by mouth daily.   ??? nitroglycerin (NITROSTAT) 0.4 mg SL tablet 1 Tab by SubLINGual route every five (5) minutes as needed for Chest Pain.   ??? progesterone (PROMETRIUM) 100 mg capsule Take 100 mg by mouth daily.   ??? busPIRone (BUSPAR) 30 mg tablet Take 1 Tab by mouth two (2) times a day.   ??? CALCIUM-MAGNESIUM-VITAMIN D2 PO Take 1 Packet by mouth daily.     No current facility-administered medications for this visit.      Allergies   Allergen Reactions   ??? Amlodipine Other (comments)     Loss of memory,nervous,jettery   ??? Losartan Other (comments)     Nervous,jettery   ??? Nifedipine Other (comments)     Swelling,palpations         Neurologic Exam     Mental Status   Oriented to person, place, and time.   Attention: normal.   Speech: speech is normal   Level of consciousness: alert    Cranial Nerves   Cranial nerves II through XII intact.     Motor Exam    Muscle bulk: normal  Overall muscle tone: normal    Strength   Strength 5/5 throughout.     Sensory Exam   Light touch normal.     Gait, Coordination, and Reflexes     Gait  Gait: normal    Tremor   Resting tremor: absent    Reflexes   Right brachioradialis: 2+  Left brachioradialis: 2+  Right biceps: 2+  Left biceps: 2+  Right triceps: 2+  Left triceps: 2+  Right patellar: 2+  Left patellar: 2+  Right achilles: 2+  Left achilles: 2+    Physical Exam   Constitutional: She is oriented to person, place, and time. She appears well-developed and well-nourished.   Cardiovascular: Normal rate.    Pulmonary/Chest: Effort normal.   Neurological: She is oriented to person, place, and time. She has normal strength. Gait normal.   Reflex Scores:       Tricep reflexes are 2+ on the right side and 2+ on the left side.       Bicep reflexes are 2+ on the right side and 2+ on the left side.       Brachioradialis reflexes are 2+ on the right side and 2+ on the left side.       Patellar reflexes are 2+ on the right side and 2+ on the left side.       Achilles reflexes are 2+ on the right side and 2+ on the left side.  Skin: Skin is warm and dry.   Psychiatric: Her speech is normal.   Very flat affect and depressed mood.   Vitals reviewed.    Visit Vitals   ??? BP 122/80   ??? Pulse 83   ??? Resp 18   ??? Wt 66.2 kg (146 lb)   ??? SpO2 97%   ??? BMI 22.87 kg/m2       Lab Results  Component Value Date/Time   WBC 8.4 09/06/2014 05:07 PM   HGB 16.2 09/06/2014 05:07 PM   HCT 47.4 09/06/2014 05:07 PM   PLATELET 444 09/06/2014 05:07 PM   MCV 94.0 09/06/2014 05:07 PM     Lab Results  Component Value Date/Time   Hemoglobin A1c 5.5 04/27/2014 04:42 PM   Hemoglobin A1c 5.3 12/22/2013 02:23 PM   Glucose 74 09/06/2014 05:57 PM   LDL, calculated 135 04/27/2014 04:42 PM   Creatinine 0.98 09/06/2014 05:57 PM      Lab Results  Component Value Date/Time   Cholesterol, total 223 04/27/2014 04:42 PM   HDL Cholesterol 62 04/27/2014 04:42 PM    LDL, calculated 135 04/27/2014 04:42 PM   Triglyceride 129 04/27/2014 04:42 PM   CHOL/HDL Ratio 3.1 08/13/2012 06:59 AM   Lab Results  Component Value Date/Time   ALT (SGPT) 28 09/06/2014 05:57 PM   AST (SGOT) 31 09/06/2014 05:57 PM   Alk. phosphatase 65 09/06/2014 05:57 PM   Bilirubin, total 0.2 09/06/2014 05:57 PM   Albumin 3.6 09/07/2014 05:35 AM   Protein, total 6.4 09/06/2014 05:57 PM   INR 1.0 09/07/2014 05:35 AM   Prothrombin time 10.5 09/07/2014 05:35 AM   PLATELET 444 09/06/2014 05:07 PM                    CT Results (maximum last 3):  Results from [place] encounter on 02/16/13   CT CODE NEURO HEAD WO CONTRAST   Narrative **Final Report**      ICD Codes / Adm.Diagnosis: 75  276.8 / Numbness    Examination:  CT CODE NEURO HEAD WO CON  - [id number] - Feb 16 2013  8:08PM  Accession No:  [id number]  Reason:  Numbness      REPORT:  INDICATION:  Numbness    COMPARISON: None.    TECHNIQUE: Unenhanced CT of the head was performed using 5 mm images. Brain   and bone windows were generated.    FINDINGS:  The ventricles and sulci are normal in size, shape and configuration and   midline. There is no significant white matter disease. There is no   intracranial hemorrhage, extra-axial collection, mass, mass effect or   midline shift.  The basilar cisterns are open. No acute infarct is   identified. The bone windows demonstrate no abnormalities. The visualized   portions of the paranasal sinuses and mastoid air cells are clear.       IMPRESSION: Negative           Signing/Reading Doctor: Charlesetta Shanks [phone])    Approved: Charlesetta Shanks [phone])  Feb 16 2013  8:10PM                                Results from Hospital Encounter encounter on 08/12/12   CTA CHEST W WO CONT   Narrative **Final Report**      ICD Codes / Adm.Diagnosis: 956387  276.8 / Palpitations    Examination:  CT CHEST ANGIOGRAPHY  - [id number] - Aug 12 2012  3:36PM  Accession No:  [id number]  Reason:  Chest pain R/O aortic pathology       REPORT:  INDICATION:   Chest pain R/O aortic pathology    COMPARISON:  None    TECHNIQUE:  Following the uneventful intravenous administration of 70 cc   Optiray 063, thin helical axial images were obtained through the chest.   Postprocessing was performed. 3D image postprocessing was performed.    FINDINGS:    There are no enlarged mediastinal or hilar lymph nodes.  The heart size is   normal.  There is no pericardial effusion.    No filling defect is  seen within the pulmonary arterial system to suggest   pulmonary embolus. The aorta is normal in caliber.    There is no focal air space disease.  No pulmonary nodule or mass is seen.   There are no pleural effusions.    Limited evaluation of the upper abdomen demonstrates no abnormality. The   osseous structures are unremarkable.       IMPRESSION:  1.  No evidence of pulmonary embolus.  2.  No acute aortic pathology.  3. Clear lungs.               Signing/Reading Doctor: Vanna Scotland [phone])    ApprovedVanna Scotland [phone])  Aug 12 2012  3:50PM                                      MRI Results (maximum last 3):    Results from [place] encounter on 04/27/13   MRI CERV SPINE W WO CONT   Narrative **Final Report**      ICD Codes / Adm.Diagnosis: 336.9  341.9 / Unspecified disease of spinal    Demyelinating disease of cent  Examination:  MR C SPINE W AND WO CON  - [id number] - Apr 27 2013  9:12PM  Accession No:  [id number]  Reason:  MYELOPATHY, DEMYELINATING DZ      REPORT:  EXAM:  MRI CERVICAL SPINE    INDICATION:  MYELOPATHY, DEMYELINATING DZ    COMPARISON: None    TECHNIQUE: MR imaging of the cervical spine was performed including sagittal   T1, T2, STIR;  axial T2, T1.    CONTRAST: After the intravenous administration of 7 mL of Gadavist   fat-suppressed sagittal and axial T1-weighted images were obtained.    FINDINGS:    There is normal alignment of the cervical spine. Vertebral body heights are   maintained. Marrow signal is normal.     The craniocervical junction is intact.  The course, caliber, and signal   intensity of the spinal cord are normal.  Enhancement pattern is normal    The paraspinal soft tissues are within normal limits.    C2-C3:  There is no spinal canal or neural foraminal stenosis    C3-C4:  There is no spinal canal or neural foraminal stenosis    C4-C5:  There is no spinal canal or neural foraminal stenosis    C5-C6:  There is a tiny central protrusion without stenosis    C6-C7:  There is a borderline disc bulge without stenosis    C7-T1:  There is no spinal canal or neural foraminal stenosis       IMPRESSION:    1. Normal cord signal and enhancement pattern  2. Minimal degeneration of the C5-6 and C6-7 disc levels without significant   canal or foraminal narrowing               Signing/Reading Doctor: Caprice Beaver PADGETT [phone])    Approved: Isidoro Donning [phone])  Apr 28 2013 10:24AM                                 Results from Hospital Encounter encounter on 04/08/13   MRI BRAIN W WO CONT   Narrative **Final Report**      ICD Codes / Adm.Diagnosis: 341.9  75 / Demyelinating disease of centr    Examination:  MR BRAIN W AND WO CON  - [id number] - Apr 08 2013  9:37PM  Accession No:  [id number]  Reason:  Demyelinating disease of central nervous system, unspecified      REPORT:  Sagittal, axial, and coronal MRI of the brain before and after 7 cc IV   gadolinium. Sagittal FLAIR sequence also performed.    Findings:    No evidence for restricted diffusion.    There are a few small scattered foci of high signal in the deep white matter   bilaterally, worse on the left than the right. No evidence for intracranial   hemorrhage or mass effect. No abnormal contrast enhancement.       IMPRESSION: There are a few small foci of high signal in the deep white   matter bilaterally which are nonspecific but may be within normal limits for   the patient's age.          Signing/Reading Doctor: Jearl Klinefelter [phone])     Approved: Jearl Klinefelter [phone])  Apr 09 2013  9:01AM                                 Results from Hospital Encounter encounter on 12/24/11   MRI BRAIN W WO CONT   Narrative **Final Report**      ICD Codes / Adm.Diagnosis: 789381   / Headache    Examination:  MR BRAIN W AND WO CON  - [id number] - Dec 24 2011  1:06PM  Accession No:  [id number]  Reason:  severe ha back of head occipital and cervical area      REPORT:  INDICATION: Severe ha back of head occipital and cervical area especially   over last 2 weeks. Difficulty with memory. 784.0    COMPARISON: None    EXAM: Sagittal T1-weighted spin-echo and T2-weighted FLAIR, axial FLAIR   T2-weighted and T2-weighted fast spin-echo, axial diffusion weighted echo   planar, axial T1-weighted gradient echo, axial susceptibility weighted   gradient echo, and post IV contrast-enhanced axial T1-weighted spin-echo and   coronal T1-weighted gradient echo MR images of the brain are obtained. A   total of 15 cc intravenous Magnevist was administered for the study.   Following the study, the patient experienced chest pain and was found to be   hypertensive with blood pressures of 190/108. The patient was transferred to   the emergency ward for further evaluation.    FINDINGS: There is no evidence for acute infarction. There are several foci   of nonspecific altered signal in the cerebral white matter bilaterally, most   prominent in the left superior frontal region. There is no intra-axial or   extra-axial enhancement abnormality or mass. No intracranial hemorrhagic   blood product deposition is evident. The vascular flow voids at the base of   the brain are normal in conspicuity. Sella, optic chiasm, posterior fossa   orbits and paranasal sinuses are normal.       IMPRESSION: Nonspecific cerebral white matter signal changes. Clinical   correlation recommended. The findings can occasionally be seen in the   setting of demyelinating processes. No evidence for acute infarction, mass    or enhancement abnormality.           Signing/Reading Doctor: DAVID G. DISLER [phone])    Approved: DAVID G. DISLER [phone])  Dec 24 2011  1:37PM  PET Results (maximum last 3):  No results found for this or any previous visit.      Assessment and Plan   Diagnoses and all orders for this visit:    1. Poor short term memory  -     REFERRAL TO PSYCHOLOGY  -     REFERRAL TO PSYCHIATRY  -     VITAMIN B12 & FOLATE  -     METHYLMALONIC ACID  -     METABOLIC PANEL, COMPREHENSIVE  -     TSH AND FREE T4    2. Poor sleep  -     REFERRAL TO PSYCHOLOGY  -     REFERRAL TO PSYCHIATRY  -     VITAMIN B12 & FOLATE  -     METHYLMALONIC ACID  -     METABOLIC PANEL, COMPREHENSIVE  -     TSH AND FREE T4    3. Other depression  -     REFERRAL TO PSYCHOLOGY  -     REFERRAL TO PSYCHIATRY  -     VITAMIN B12 & FOLATE  -     METHYLMALONIC ACID  -     METABOLIC PANEL, COMPREHENSIVE  -     TSH AND FREE T12      60 year old woman presenting with short-term memory loss worsening in the setting of her current job position.  I am highly suspicious for underlying mood disorder that has now impairing her performance and quality of life.  I am not finding any signs to suggest neurodegenerative disease.  I reviewed a lot of her handwritten notes and they are very well done.  I am going to obtain some screening blood work.  I am sending her to neuropsychology for formal memory testing.  I am referring her to psychiatry for medication management.  She finds herself having to double her dose of Cymbalta on "bad days" which suggest her medication is not therapeutic or appropriate for her completely.  I would like to see her after her testing is done.          A notice of this visit/encounter being completed has been sent electronically to the patient's PCP and/or referring provider.     [place], [place]  Diplomate ABPN

## 2016-06-20 NOTE — Patient Instructions (Addendum)
[facility]   Statement to Patients  February 01, 2013      In an effort to ensure the large volume of patient prescription refills is processed in the most efficient and expeditious manner, we are asking our patients to assist [place] by calling your Pharmacy for all prescription refills, this will include also your  Mail Order Pharmacy. The pharmacy will contact our office electronically to continue the refill process.    Please do not wait until the last minute to call your pharmacy. We need at least 48 hours (2days) to fill prescriptions. We also encourage you to call your pharmacy before going to pick up your prescription to make sure it is ready.     With regard to controlled substance prescription refill requests (narcotic refills) that need to be picked up at our office, we ask your cooperation by providing [place] with at least 72 hours (3days) notice that you will need a refill.    We will not refill narcotic prescription refill requests after 4:00pm on any weekday, Monday through Thursday, or after 2:00pm on Fridays, or on the weekends.      We encourage everyone to explore another way of getting your prescription refill request processed using MyChart, our patient web portal through our electronic medical record system. MyChart is an efficient and effective way to communicate your medication request directly to the office and  downloadable as an app on your smart phone . MyChart also features a review functionality that allows you to view your medication list as well as leave messages for your physician. Are you ready to get connected? If so please review the attatched instructions or speak to any of our staff to get you set up right away!    Thank you so much for your cooperation. Should you have any questions please contact our Engineer, building services.    The Physicians and Staff,  [facility]      Thank you for choosing [place] and [facility] for your     care.  You may receive a survey about your visit.  We appreciate you taking time     to complete this survey as we use your feedback to improve our services.  We     realize we are not perfect, but we strive to provide excellent care.     Depression and Chronic Disease: Care Instructions  Your Care Instructions  A chronic disease is one that you have for a long time. Some chronic diseases can be controlled, but they usually cannot be cured. Depression is common in people with chronic diseases, but it often goes unnoticed.  Many people have concerns about seeking treatment for a mental health problem. You may think it's a sign of weakness, or you don't want people to know about it. It's important to overcome these reasons for not seeking treatment. Treating depression or anxiety is good for your health.  Follow-up care is a key part of your treatment and safety. Be sure to make and go to all appointments, and call your doctor if you are having problems. It's also a good idea to know your test results and keep a list of the medicines you take.  How can you care for yourself at home?  Watch for symptoms of depression  The symptoms of depression are often subtle at first. You may think they are caused by your disease rather than depression. Or you  may think it is normal to be depressed when you have a chronic disease.  If you are depressed you may:  ?? Feel sad or hopeless.  ?? Feel guilty or worthless.  ?? Not enjoy the things you used to enjoy.  ?? Feel hopeless, as though life is not worth living.  ?? Have trouble thinking or remembering.  ?? Have low energy, and you may not eat or sleep well.  ?? Pull away from others.  ?? Think often about death or killing yourself. (Keep the numbers for these national suicide hotlines: 1-800-273-TALK [1-430-067-6858] and 1-800-SUICIDE [1-937-430-6439].)  Get treatment   By treating your depression, you can feel more hopeful and have more energy. If you feel better, you may take better care of yourself, so your health may improve.  ?? Talk to your doctor if you have any changes in mood during treatment for your disease.  ?? Ask your doctor for help. Counseling, antidepressant medicine, or a combination of the two can help most people with depression. Often a combination works best. Counseling can also help you cope with having a chronic disease.  When should you call for help?  Call 911 anytime you think you may need emergency care. For example, call if:  ?? You feel like hurting yourself or someone else.  ?? Someone you know has depression and is about to attempt or is attempting suicide.  Call your doctor now or seek immediate medical care if:  ?? You hear voices.  ?? Someone you know has depression and:  ?? Starts to give away his or her possessions.  ?? Uses illegal drugs or drinks alcohol heavily.  ?? Talks or writes about death, including writing suicide notes or talking about guns, knives, or pills.  ?? Starts to spend a lot of time alone.  ?? Acts very aggressively or suddenly appears calm.  Watch closely for changes in your health, and be sure to contact your doctor if:  ?? You do not get better as expected.  Where can you learn more?  Go to InsuranceStats.cahttp://www.healthwise.net/GoodHelpConnections.  Enter [phone] in the search box to learn more about "Depression and Chronic Disease: Care Instructions."  Current as of: May 29, 2015  Content Version: 11.3  ?? 2006-2017 [employer]. Care instructions adapted under license by [employer] (which disclaims liability or warranty for this information). If you have questions about a medical condition or this instruction, always ask your healthcare professional. [employer] disclaims any warranty or liability for your use of this information.       Learning About Mood Disorders  What are mood disorders?     Mood disorders are medical problems that affect how you feel. They can impact your moods, thoughts, and actions. Mood disorders include:  ?? Depression. This causes you to feel sad or hopeless for much of the time.  ?? Bipolar disorder. This causes extreme mood changes from manic episodes of very high energy to extreme lows of depression.  ?? Seasonal affective disorder (SAD). This is a type of depression that affects you during the same season each year. Most often people experience SAD during the fall and winter months when days are shorter and there is less light.  What are the symptoms?  Depression  You may:  ?? Feel sad or hopeless nearly every day.  ?? Lose interest in or not get pleasure from most daily activities. You feel this way nearly every day.  ?? Have low energy, changes in your appetite,  or changes in how well you sleep.  ?? Have trouble concentrating.  ?? Think about death and suicide. Keep the numbers for these national suicide hotlines: 1-800-273-TALK 2365138853(1-315 813 4737) and 1-800-SUICIDE (636)678-0912(1-740-030-0873). If you or someone you know talks about suicide or feeling hopeless, get help right away.  Bipolar disorder  Symptoms depend on your mood swings. You may:  ?? Feel very happy, energetic, or on edge.  ?? Feel like you need very little sleep.  ?? Feel overly self-confident.  ?? Do impulsive things, such as spending a lot of money.  ?? Feel sad or hopeless.  ?? Have racing thoughts or trouble thinking and making decisions.  ?? Lose interest in things you have enjoyed in the past.  ?? Think about death and suicide. Keep the numbers for these national suicide hotlines: 1-800-273-TALK 314-803-8605(1-315 813 4737) and 1-800-SUICIDE (404)011-0124(1-740-030-0873). If you or someone you know talks about suicide or feeling hopeless, get help right away.  Seasonal affective disorder (SAD)  Symptoms come and go at about the same time each year. For most people with SAD, symptoms come during the winter when there is less daylight. You may:   ?? Feel sad, grumpy, moody, or anxious.  ?? Lose interest in your usual activities.  ?? Eat more and crave carbohydrates, such as bread and pasta.  ?? Gain weight.  ?? Sleep more and feel drowsy during the daytime.  How are mood disorders treated?  Mood disorders can be treated with medicines or counseling, or a combination of both.  Medicines for depression and SAD may include antidepressants.  Medicines for bipolar disorder may include:  ?? Mood stabilizers.  ?? Antipsychotics.  ?? Benzodiazepines.  Counseling may involve cognitive-behavioral therapy. It teaches you how to change the ways you think and behave. This can help you stop thinking bad thoughts about yourself and your life.  Light therapy is the main treatment for SAD. This therapy uses a special kind of lamp. You let the lamp shine on you at certain times, usually in the morning. This may help your symptoms during the months when there is less sunlight.  Healthy lifestyle  Healthy lifestyle changes may help you feel better.  ?? Get at least 30 minutes of exercise on most days of the week. Walking is a good choice.  ?? Eat a healthy diet. Include fruits, vegetables, lean proteins, and whole grains in your diet each day.  ?? Keep a regular sleep schedule. Try for 8 hours of sleep a night.  ?? Find ways to manage stress, such as relaxation exercises.  ?? Avoid alcohol and illegal drugs.  Follow-up care is a key part of your treatment and safety. Be sure to make and go to all appointments, and call your doctor if you are having problems. It's also a good idea to know your test results and keep a list of the medicines you take.  Where can you learn more?  Go to InsuranceStats.cahttp://www.healthwise.net/GoodHelpConnections.  Enter [phone] in the search box to learn more about "Learning About Mood Disorders."  Current as of: Mar 05, 2016  Content Version: 11.3  ?? 2006-2017 [employer]. Care instructions adapted under  license by [employer] (which disclaims liability or warranty for this information). If you have questions about a medical condition or this instruction, always ask your healthcare professional. [employer] disclaims any warranty or liability for your use of this information.

## 2016-06-20 NOTE — Progress Notes (Signed)
Patient here for C/O decline in memory.

## 2016-06-20 NOTE — Communication Body (Signed)
Chief Complaint   Patient presents with   ??? Memory Loss       Referred by: Dr. Luiz Ochoa      HPI    Vanessa [name] is a 60 year old woman with hypertension.  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 she is referred for memory loss.  She tells me that she feels like her memory has been rather poor quality since December 31, 2011 or 2012/12/30 after her mother passed away.  She had an MRI done at that time which was overall benign. She is here because her work has now placed her on a 30 day leave of absence because she has been performing poorly at work.  She presented information of an evaluation done by her manager showing poor accounting practices, over payments  And failure to complete work requirements in a 40 hour work week.  The patient tells me she has very poor concentration.  She has a write notes down for everything she does.  She finds herself forgetting her notes.  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  She feels that in the past 14 months her memory has worsened.  This coincides with her beginning her current position.  Her sleep is very poor quality.  When she gets home she feels like she is ready to go to sleep.  She has seen mental health in the past for medication management and continues to take Cymbalta.  Her psychiatrist is no longer seeing patients.  She sees a psychologist who has completed her FMLA paperwork.  She denies any speech or vision changes.  She has some myalgias on the right.  ADLs intact.    Review of Systems   Musculoskeletal: Positive for myalgias.   Psychiatric/Behavioral: Positive for depression and memory loss. The patient has insomnia.    All other systems reviewed and are negative.      Past Medical History:   Diagnosis Date   ??? Depression    ??? H/O colonoscopy 09-24-12    diverticulosis   ??? Hepatic steatosis    ??? Hypertension    ??? Normal cardiac stress test 02-10-12   ??? Pneumonia      Family History   Problem Relation Age of Onset    ??? Heart Disease Mother    ??? Heart Disease Father      Social History     Social History   ??? Marital status: MARRIED     Spouse name: N/A   ??? Number of children: N/A   ??? Years of education: N/A     Occupational History   ??? Not on file.     Social History Main Topics   ??? Smoking status: Never Smoker   ??? Smokeless tobacco: Never Used   ??? Alcohol use No   ??? Drug use: No   ??? Sexual activity: Yes     Partners: Male     Birth control/ protection: None     Other Topics Concern   ??? Not on file     Social History Narrative     Current Outpatient Prescriptions   Medication Sig   ??? minoxidil (ROGAINE) 2 % external solution Apply  to affected area two (2) times a day. Apply to scalp and rub it in   ??? DULoxetine (CYMBALTA) 30 mg capsule Take 30 mg by mouth daily.   ??? VAYARIN 75-8.5-21.5 mg cap Take 1 Cap by mouth two (2) times a day.   ??? valsartan-hydrochlorothiazide (  DIOVAN-HCT) 160-12.5 mg per tablet Take 1 Tab by mouth daily.   ??? vit B Cmplx 3-FA-Vit C-Biotin (NEPHRO VITE RX) 1-60-300 mg-mg-mcg tablet Take 1 Tab by mouth daily.   ??? omeprazole (PRILOSEC) 40 mg capsule Take 1 Cap by mouth daily.   ??? nitroglycerin (NITROSTAT) 0.4 mg SL tablet 1 Tab by SubLINGual route every five (5) minutes as needed for Chest Pain.   ??? progesterone (PROMETRIUM) 100 mg capsule Take 100 mg by mouth daily.   ??? busPIRone (BUSPAR) 30 mg tablet Take 1 Tab by mouth two (2) times a day.   ??? CALCIUM-MAGNESIUM-VITAMIN D2 PO Take 1 Packet by mouth daily.     No current facility-administered medications for this visit.      Allergies   Allergen Reactions   ??? Amlodipine Other (comments)     Loss of memory,nervous,jettery   ??? Losartan Other (comments)     Nervous,jettery   ??? Nifedipine Other (comments)     Swelling,palpations         Neurologic Exam     Mental Status   Oriented to person, place, and time.   Attention: normal.   Speech: speech is normal   Level of consciousness: alert    Cranial Nerves   Cranial nerves II through XII intact.     Motor Exam    Muscle bulk: normal  Overall muscle tone: normal    Strength   Strength 5/5 throughout.     Sensory Exam   Light touch normal.     Gait, Coordination, and Reflexes     Gait  Gait: normal    Tremor   Resting tremor: absent    Reflexes   Right brachioradialis: 2+  Left brachioradialis: 2+  Right biceps: 2+  Left biceps: 2+  Right triceps: 2+  Left triceps: 2+  Right patellar: 2+  Left patellar: 2+  Right achilles: 2+  Left achilles: 2+    Physical Exam   Constitutional: She is oriented to person, place, and time. She appears well-developed and well-nourished.   Cardiovascular: Normal rate.    Pulmonary/Chest: Effort normal.   Neurological: She is oriented to person, place, and time. She has normal strength. Gait normal.   Reflex Scores:       Tricep reflexes are 2+ on the right side and 2+ on the left side.       Bicep reflexes are 2+ on the right side and 2+ on the left side.       Brachioradialis reflexes are 2+ on the right side and 2+ on the left side.       Patellar reflexes are 2+ on the right side and 2+ on the left side.       Achilles reflexes are 2+ on the right side and 2+ on the left side.  Skin: Skin is warm and dry.   Psychiatric: Her speech is normal.   Very flat affect and depressed mood.   Vitals reviewed.    Visit Vitals   ??? BP 122/80   ??? Pulse 83   ??? Resp 18   ??? Wt 66.2 kg (146 lb)   ??? SpO2 97%   ??? BMI 22.87 kg/m2       Lab Results  Component Value Date/Time   WBC 8.4 09/06/2014 05:07 PM   HGB 16.2 09/06/2014 05:07 PM   HCT 47.4 09/06/2014 05:07 PM   PLATELET 444 09/06/2014 05:07 PM   MCV 94.0 09/06/2014 05:07 PM     Lab Results  Component Value Date/Time   Hemoglobin A1c 5.5 04/27/2014 04:42 PM   Hemoglobin A1c 5.3 12/22/2013 02:23 PM   Glucose 74 09/06/2014 05:57 PM   LDL, calculated 135 04/27/2014 04:42 PM   Creatinine 0.98 09/06/2014 05:57 PM      Lab Results  Component Value Date/Time   Cholesterol, total 223 04/27/2014 04:42 PM   HDL Cholesterol 62 04/27/2014 04:42 PM    LDL, calculated 135 04/27/2014 04:42 PM   Triglyceride 129 04/27/2014 04:42 PM   CHOL/HDL Ratio 3.1 08/13/2012 06:59 AM   Lab Results  Component Value Date/Time   ALT (SGPT) 28 09/06/2014 05:57 PM   AST (SGOT) 31 09/06/2014 05:57 PM   Alk. phosphatase 65 09/06/2014 05:57 PM   Bilirubin, total 0.2 09/06/2014 05:57 PM   Albumin 3.6 09/07/2014 05:35 AM   Protein, total 6.4 09/06/2014 05:57 PM   INR 1.0 09/07/2014 05:35 AM   Prothrombin time 10.5 09/07/2014 05:35 AM   PLATELET 444 09/06/2014 05:07 PM                    CT Results (maximum last 3):  Results from [place] encounter on 02/16/13   CT CODE NEURO HEAD WO CONTRAST   Narrative **Final Report**      ICD Codes / Adm.Diagnosis: 75  276.8 / Numbness    Examination:  CT CODE NEURO HEAD WO CON  - [id number] - Feb 16 2013  8:08PM  Accession No:  [id number]  Reason:  Numbness      REPORT:  INDICATION:  Numbness    COMPARISON: None.    TECHNIQUE: Unenhanced CT of the head was performed using 5 mm images. Brain   and bone windows were generated.    FINDINGS:  The ventricles and sulci are normal in size, shape and configuration and   midline. There is no significant white matter disease. There is no   intracranial hemorrhage, extra-axial collection, mass, mass effect or   midline shift.  The basilar cisterns are open. No acute infarct is   identified. The bone windows demonstrate no abnormalities. The visualized   portions of the paranasal sinuses and mastoid air cells are clear.       IMPRESSION: Negative           Signing/Reading Doctor: Charlesetta Shanks [phone])    Approved: Charlesetta Shanks [phone])  Feb 16 2013  8:10PM                                Results from Hospital Encounter encounter on 08/12/12   CTA CHEST W WO CONT   Narrative **Final Report**      ICD Codes / Adm.Diagnosis: 784696  276.8 / Palpitations    Examination:  CT CHEST ANGIOGRAPHY  - [id number] - Aug 12 2012  3:36PM  Accession No:  [id number]  Reason:  Chest pain R/O aortic pathology       REPORT:  INDICATION:   Chest pain R/O aortic pathology    COMPARISON:  None    TECHNIQUE:  Following the uneventful intravenous administration of 70 cc   Optiray 725, thin helical axial images were obtained through the chest.   Postprocessing was performed. 3D image postprocessing was performed.    FINDINGS:    There are no enlarged mediastinal or hilar lymph nodes.  The heart size is   normal.  There is no pericardial effusion.    No filling defect is  seen within the pulmonary arterial system to suggest   pulmonary embolus. The aorta is normal in caliber.    There is no focal air space disease.  No pulmonary nodule or mass is seen.   There are no pleural effusions.    Limited evaluation of the upper abdomen demonstrates no abnormality. The   osseous structures are unremarkable.       IMPRESSION:  1.  No evidence of pulmonary embolus.  2.  No acute aortic pathology.  3. Clear lungs.               Signing/Reading Doctor: Vanna Scotland [phone])    ApprovedVanna Scotland [phone])  Aug 12 2012  3:50PM                                      MRI Results (maximum last 3):    Results from [place] encounter on 04/27/13   MRI CERV SPINE W WO CONT   Narrative **Final Report**      ICD Codes / Adm.Diagnosis: 336.9  341.9 / Unspecified disease of spinal    Demyelinating disease of cent  Examination:  MR C SPINE W AND WO CON  - [id number] - Apr 27 2013  9:12PM  Accession No:  [id number]  Reason:  MYELOPATHY, DEMYELINATING DZ      REPORT:  EXAM:  MRI CERVICAL SPINE    INDICATION:  MYELOPATHY, DEMYELINATING DZ    COMPARISON: None    TECHNIQUE: MR imaging of the cervical spine was performed including sagittal   T1, T2, STIR;  axial T2, T1.    CONTRAST: After the intravenous administration of 7 mL of Gadavist   fat-suppressed sagittal and axial T1-weighted images were obtained.    FINDINGS:    There is normal alignment of the cervical spine. Vertebral body heights are   maintained. Marrow signal is normal.     The craniocervical junction is intact.  The course, caliber, and signal   intensity of the spinal cord are normal.  Enhancement pattern is normal    The paraspinal soft tissues are within normal limits.    C2-C3:  There is no spinal canal or neural foraminal stenosis    C3-C4:  There is no spinal canal or neural foraminal stenosis    C4-C5:  There is no spinal canal or neural foraminal stenosis    C5-C6:  There is a tiny central protrusion without stenosis    C6-C7:  There is a borderline disc bulge without stenosis    C7-T1:  There is no spinal canal or neural foraminal stenosis       IMPRESSION:    1. Normal cord signal and enhancement pattern  2. Minimal degeneration of the C5-6 and C6-7 disc levels without significant   canal or foraminal narrowing               Signing/Reading Doctor: Caprice Beaver PADGETT [phone])    Approved: Isidoro Donning [phone])  Apr 28 2013 10:24AM                                 Results from Hospital Encounter encounter on 04/08/13   MRI BRAIN W WO CONT   Narrative **Final Report**      ICD Codes / Adm.Diagnosis: 341.9  75 / Demyelinating disease of centr    Examination:  MR BRAIN W AND WO CON  - [id number] - Apr 08 2013  9:37PM  Accession No:  [id number]  Reason:  Demyelinating disease of central nervous system, unspecified      REPORT:  Sagittal, axial, and coronal MRI of the brain before and after 7 cc IV   gadolinium. Sagittal FLAIR sequence also performed.    Findings:    No evidence for restricted diffusion.    There are a few small scattered foci of high signal in the deep white matter   bilaterally, worse on the left than the right. No evidence for intracranial   hemorrhage or mass effect. No abnormal contrast enhancement.       IMPRESSION: There are a few small foci of high signal in the deep white   matter bilaterally which are nonspecific but may be within normal limits for   the patient's age.          Signing/Reading Doctor: Jearl Klinefelter [phone])     Approved: Jearl Klinefelter [phone])  Apr 09 2013  9:01AM                                 Results from Hospital Encounter encounter on 12/24/11   MRI BRAIN W WO CONT   Narrative **Final Report**      ICD Codes / Adm.Diagnosis: 789381   / Headache    Examination:  MR BRAIN W AND WO CON  - [id number] - Dec 24 2011  1:06PM  Accession No:  [id number]  Reason:  severe ha back of head occipital and cervical area      REPORT:  INDICATION: Severe ha back of head occipital and cervical area especially   over last 2 weeks. Difficulty with memory. 784.0    COMPARISON: None    EXAM: Sagittal T1-weighted spin-echo and T2-weighted FLAIR, axial FLAIR   T2-weighted and T2-weighted fast spin-echo, axial diffusion weighted echo   planar, axial T1-weighted gradient echo, axial susceptibility weighted   gradient echo, and post IV contrast-enhanced axial T1-weighted spin-echo and   coronal T1-weighted gradient echo MR images of the brain are obtained. A   total of 15 cc intravenous Magnevist was administered for the study.   Following the study, the patient experienced chest pain and was found to be   hypertensive with blood pressures of 190/108. The patient was transferred to   the emergency ward for further evaluation.    FINDINGS: There is no evidence for acute infarction. There are several foci   of nonspecific altered signal in the cerebral white matter bilaterally, most   prominent in the left superior frontal region. There is no intra-axial or   extra-axial enhancement abnormality or mass. No intracranial hemorrhagic   blood product deposition is evident. The vascular flow voids at the base of   the brain are normal in conspicuity. Sella, optic chiasm, posterior fossa   orbits and paranasal sinuses are normal.       IMPRESSION: Nonspecific cerebral white matter signal changes. Clinical   correlation recommended. The findings can occasionally be seen in the   setting of demyelinating processes. No evidence for acute infarction, mass    or enhancement abnormality.           Signing/Reading Doctor: DAVID G. DISLER [phone])    Approved: DAVID G. DISLER [phone])  Dec 24 2011  1:37PM  PET Results (maximum last 3):  No results found for this or any previous visit.      Assessment and Plan   Diagnoses and all orders for this visit:    1. Poor short term memory  -     REFERRAL TO PSYCHOLOGY  -     REFERRAL TO PSYCHIATRY  -     VITAMIN B12 & FOLATE  -     METHYLMALONIC ACID  -     METABOLIC PANEL, COMPREHENSIVE  -     TSH AND FREE T4    2. Poor sleep  -     REFERRAL TO PSYCHOLOGY  -     REFERRAL TO PSYCHIATRY  -     VITAMIN B12 & FOLATE  -     METHYLMALONIC ACID  -     METABOLIC PANEL, COMPREHENSIVE  -     TSH AND FREE T4    3. Other depression  -     REFERRAL TO PSYCHOLOGY  -     REFERRAL TO PSYCHIATRY  -     VITAMIN B12 & FOLATE  -     METHYLMALONIC ACID  -     METABOLIC PANEL, COMPREHENSIVE  -     TSH AND FREE T12      60 year old woman presenting with short-term memory loss worsening in the setting of her current job position.  I am highly suspicious for underlying mood disorder that has now impairing her performance and quality of life.  I am not finding any signs to suggest neurodegenerative disease.  I reviewed a lot of her handwritten notes and they are very well done.  I am going to obtain some screening blood work.  I am sending her to neuropsychology for formal memory testing.  I am referring her to psychiatry for medication management.  She finds herself having to double her dose of Cymbalta on "bad days" which suggest her medication is not therapeutic or appropriate for her completely.  I would like to see her after her testing is done.          A notice of this visit/encounter being completed has been sent electronically to the patient's PCP and/or referring provider.     [place], [place]  Diplomate ABPN

## 2016-06-24 NOTE — Telephone Encounter (Signed)
Pt called Dr. Lois HuxleyKhawaja's office to make an appointment but their next available isn't until January. She was told if Dr. Orvis BrillEdmonson called the office and told them she couldn't wait until January, they might be able to get her in sooner. Please give her a call back

## 2016-06-25 NOTE — Telephone Encounter (Signed)
Spoke with patient informed office contacted patient referral for Dr. Wynonia HazardKhawaja, [facility].

## 2016-06-26 LAB — METABOLIC PANEL, COMPREHENSIVE
A-G Ratio: 1.3 (ref 1.2–2.2)
ALT (SGPT): 19 IU/L (ref 0–32)
AST (SGOT): 24 IU/L (ref 0–40)
Albumin: 4.2 g/dL (ref 3.5–5.5)
Alk. phosphatase: 59 IU/L (ref 39–117)
BUN/Creatinine ratio: 19 (ref 9–23)
BUN: 19 mg/dL (ref 6–24)
Bilirubin, total: 0.3 mg/dL (ref 0.0–1.2)
CO2: 23 mmol/L (ref 18–29)
Calcium: 10.1 mg/dL (ref 8.7–10.2)
Chloride: 104 mmol/L (ref 96–106)
Creatinine: 0.99 mg/dL (ref 0.57–1.00)
GFR est AA: 73 mL/min/{1.73_m2} (ref >59)
GFR est non-AA: 63 mL/min/{1.73_m2} (ref >59)
GLOBULIN, TOTAL: 3.2 g/dL (ref 1.5–4.5)
Glucose: 84 mg/dL (ref 65–99)
Potassium: 4.3 mmol/L (ref 3.5–5.2)
Protein, total: 7.4 g/dL (ref 6.0–8.5)
Sodium: 148 mmol/L — ABNORMAL HIGH (ref 134–144)

## 2016-06-26 LAB — VITAMIN B12 & FOLATE
Folate: 17.1 ng/mL (ref >3.0)
Vitamin B12: 1937 pg/mL — ABNORMAL HIGH (ref 211–946)

## 2016-06-26 LAB — METHYLMALONIC ACID: METHYLMALONIC ACID, SERUM: 175 nmol/L (ref 0–378)

## 2016-06-26 LAB — TSH AND FREE T4
T4, Free: 1.05 ng/dL (ref 0.82–1.77)
TSH: 1.21 u[IU]/mL (ref 0.450–4.500)

## 2016-06-26 NOTE — Telephone Encounter (Signed)
Pt said Dr. Venetia Constable office is requesting a release of records form be faxed so they can send over her test results. PSR informed the pt she would have to come to the office to sign a release or it could be mailed. She said she didn't want to drive all the way to the office and mailing it would take too long. She would like a call back from the nurse

## 2016-06-26 NOTE — Telephone Encounter (Signed)
Spoke with patient informed of request of office note per MD, faxed to Dr. Venetia ConstablePeck's office.

## 2016-06-27 NOTE — Telephone Encounter (Signed)
Spoke with patient informed of Dr. Venetia ConstablePeck's office request for release of information. Patient to come to sign release form in office.

## 2016-06-27 NOTE — Telephone Encounter (Signed)
Dr. Venetia ConstablePeck's office calling re to a referral they received for the pt. She said it still has Dr. Lois HuxleyKhawaja's name on it and Dr. Philis NettlePeck will not touch it if it has Dr. Lois HuxleyKhawaja's name on it. She also said it says treat for depression and short term memory. She said she is assuming it's just short term memory she needs to be treated for and not depression. Please send over new referral and call with any questions.

## 2016-06-30 ENCOUNTER — Encounter: Payer: Self-pay | Admitting: Hematology and Oncology

## 2016-06-30 NOTE — Progress Notes (Signed)
Recd letter from [facility] to advise of xarelto asst.1 year.

## 2016-07-01 NOTE — Telephone Encounter (Signed)
Pt stopped by the office to sign release forms for Dr. Philis Nettle, Dr. Roddie Mc, and Dr. Catha Gosselin. She also said Dr. Wynonia Hazard is booking out until [facility] is having a hard time finding an appt.    She would like her blood work results sent to Dr. Catha Gosselin, she has an appt at 7:30am tomorrow.

## 2016-07-01 NOTE — Telephone Encounter (Signed)
Office note per request Faxed to Dr. Alyce PaganBanergee.

## 2016-07-03 ENCOUNTER — Encounter: Payer: Self-pay | Admitting: Internal Medicine

## 2016-07-03 ENCOUNTER — Ambulatory Visit: Payer: Self-pay | Attending: Internal Medicine | Admitting: Internal Medicine

## 2016-07-03 VITALS — BP 136/83 | HR 75 | Temp 98.6°F | Resp 18 | Ht 62.0 in | Wt 195.0 lb

## 2016-07-03 DIAGNOSIS — I2782 Chronic pulmonary embolism: Secondary | ICD-10-CM

## 2016-07-03 DIAGNOSIS — R202 Paresthesia of skin: Secondary | ICD-10-CM

## 2016-07-03 DIAGNOSIS — I2699 Other pulmonary embolism without acute cor pulmonale: Secondary | ICD-10-CM | POA: Insufficient documentation

## 2016-07-03 DIAGNOSIS — E785 Hyperlipidemia, unspecified: Secondary | ICD-10-CM | POA: Insufficient documentation

## 2016-07-03 DIAGNOSIS — Z7901 Long term (current) use of anticoagulants: Secondary | ICD-10-CM | POA: Insufficient documentation

## 2016-07-03 DIAGNOSIS — M79662 Pain in left lower leg: Secondary | ICD-10-CM | POA: Insufficient documentation

## 2016-07-03 DIAGNOSIS — F411 Generalized anxiety disorder: Secondary | ICD-10-CM | POA: Insufficient documentation

## 2016-07-03 DIAGNOSIS — R200 Anesthesia of skin: Secondary | ICD-10-CM

## 2016-07-03 DIAGNOSIS — Z1231 Encounter for screening mammogram for malignant neoplasm of breast: Secondary | ICD-10-CM

## 2016-07-03 DIAGNOSIS — M25562 Pain in left knee: Secondary | ICD-10-CM

## 2016-07-03 MED ORDER — NORTRIPTYLINE HCL 25 MG PO CAPS: 25.0000 mg | ORAL_CAPSULE | Freq: Every day | ORAL | 1 refills | Status: DC

## 2016-07-03 MED ORDER — HYDROXYZINE HCL 25 MG PO TABS: 25.0000 mg | ORAL_TABLET | Freq: Three times a day (TID) | ORAL | 3 refills | Status: DC | PRN

## 2016-07-03 MED ORDER — ATORVASTATIN CALCIUM 40 MG PO TABS: 40.0000 mg | ORAL_TABLET | Freq: Every day | ORAL | 3 refills | Status: DC

## 2016-07-03 MED ORDER — XARELTO 20 MG PO TABS: ORAL_TABLET | ORAL | 3 refills | Status: DC

## 2016-07-03 MED ORDER — GABAPENTIN 100 MG PO CAPS: 100.0000 mg | ORAL_CAPSULE | Freq: Three times a day (TID) | ORAL | 3 refills | Status: DC

## 2016-07-03 NOTE — Progress Notes (Signed)
Tammy [name], is a 60 y.o. female  [id number]  [id number]  DOB - [birth year]  Chief Complaint  Patient presents with  . Anxiety        Subjective:   Tammy [name] is a 60 y.o. 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 Patient's major complaint today is ongoing bilateral leg pain and bilateral hand pain and swelling. She had been evaluated severally in the past for autoimmune disorders, initial evaluation revealed positive ANA but other follow-up testings were negative. She is also complaining of tingling and numbness of both upper limbs. She denies any neck pain or swelling, no neck stiffness. Patient has No headache, No chest pain, No abdominal pain - No Nausea, No Cough - SOB. Her only current medications as Xarelto and atorvastatin. She does not smoke cigarette, she does not drink alcohol. 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 She has no known family history of autoimmune disorder. Her current situation including chronic pain is causing her a significant amount of anxiety but she denies being depressed and she denies suicidal ideations or thoughts..  Problem  Numbness and Tingling in Left Upper Extremity  Pulmonary Embolism Without Acute Cor Pulmonale (Hcc)    ALLERGIES: No Known Allergies  PAST MEDICAL HISTORY: Past Medical History:  Diagnosis Date  . Chronic headaches   . DVT (deep venous thrombosis) ([place])   . Leukemia ([place])    Told she had it at age 68, given some shots  . Pulmonary embolism ([place])   . Seasonal allergies     MEDICATIONS AT HOME: Prior to Admission medications   Medication Sig Start Date End Date Taking? Authorizing Provider  atorvastatin (LIPITOR) 40 MG tablet Take 1 tablet (40 mg total) by mouth daily. 07/03/16   Tresa Garter, MD  gabapentin (NEURONTIN) 100 MG capsule Take 1 capsule (100 mg total) by mouth 3 (three) times daily. 07/03/16   Tresa Garter, MD  hydrOXYzine (ATARAX/VISTARIL) 25 MG tablet Take 1 tablet (25 mg total) by mouth 3 (three) times daily as needed for anxiety. 07/03/16   Tresa Garter, MD  nortriptyline (PAMELOR) 25 MG capsule Take 1 capsule (25 mg total) by mouth at bedtime. 07/03/16   Valin Massie E Doreene Burke, MD  XARELTO 20 MG TABS tablet TAKE 1 TABLET BY MOUTH ONCE DAILY WITH SUPPER 07/03/16   Tresa Garter, MD    Objective:   Vitals:   07/03/16 1055  BP: 136/83  Pulse: 75  Resp: 18  Temp: 98.6 F (37 C)  TempSrc: Oral  SpO2: 97%  Weight: 195 lb (88.5 kg)  Height: 5\' 2"  (1.575 m)    Exam General appearance : Awake, alert, not in any distress. Speech Clear. Not toxic looking HEENT: Atraumatic and Normocephalic, pupils equally reactive to light and accomodation Neck: Supple, no JVD. No cervical lymphadenopathy.  Chest: Good air entry bilaterally, no added sounds  CVS: S1 S2 regular, no murmurs.  Abdomen: Bowel sounds present, Non tender and not distended with no gaurding, rigidity or rebound. Extremities: B/L Lower Ext shows no edema, both legs are warm to touch Neurology: Awake alert, and oriented X 3, CN II-XII intact, Non focal Skin: No Rash  Data Review Lab Results  Component Value Date   HGBA1C 5.70 10/22/2015   HGBA1C 5.90 03/08/2015   HGBA1C  07/04/2007    5.7 (NOTE)   The ADA recommends the following therapeutic goals for glycemic  control related to Hgb A1C measurement:   Goal of Therapy:   < 7.0% Hgb A1C   Action Suggested:  > 8.0% Hgb A1C   Ref:  Diabetes Care, 22, Suppl. 1, 1999   Assessment & Plan   1. Dyslipidemia  - atorvastatin (LIPITOR) 40 MG tablet; Take 1 tablet (40 mg total) by mouth daily.  Dispense: 90 tablet; Refill: 3  To address this please limit saturated fat to no more than 7% of your calories, limit cholesterol to 200 mg/day, increase fiber and exercise as tolerated. If needed we may add another cholesterol lowering medication to your regimen.   2.  Generalized anxiety disorder  - hydrOXYzine (ATARAX/VISTARIL) 25 MG tablet; Take 1 tablet (25 mg total) by mouth 3 (three) times daily as needed for anxiety.  Dispense: 30 tablet; Refill: 3  3. Arthralgia of left lower leg Prescribed - nortriptyline (PAMELOR) 25 MG capsule; Take 1 capsule (25 mg total) by mouth at bedtime.  Dispense: 30 capsule; Refill: 1  4. Numbness and tingling in left upper extremity  - DG Cervical Spine Complete; Future  Prescribed - gabapentin (NEURONTIN) 100 MG capsule; Take 1 capsule (100 mg total) by mouth 3 (three) times daily.  Dispense: 90 capsule; Refill: 3 - nortriptyline (PAMELOR) 25 MG capsule; Take 1 capsule (25 mg total) by mouth at bedtime.  Dispense: 30 capsule; Refill: 1  5. Other chronic pulmonary embolism without acute cor pulmonale (HCC)  - XARELTO 20 MG TABS tablet; TAKE 1 TABLET BY MOUTH ONCE DAILY WITH SUPPER  Dispense: 90 tablet; Refill: 3  Patient have been counseled extensively about nutrition and exercise  Return in about 3 months (around 10/02/2016) for Generalized Anxiety Disorder, Follow up Pain and comorbidities, Follow up HTN.  The patient was given clear instructions to go to ER or return to medical center if symptoms don't improve, worsen or new problems develop. The patient verbalized understanding. The patient was told to call to get lab results if they haven't heard anything in the next week.   This note has been created with Surveyor, quantity. Any transcriptional errors are unintentional.    Angelica Chessman, MD, [place], [place], [place], [place] and [facility] [place], [place]   07/03/2016, 11:40 AM

## 2016-07-03 NOTE — Patient Instructions (Addendum)
Generalized Anxiety Disorder Generalized anxiety disorder (GAD) is a mental disorder. It interferes with life functions, including relationships, work, and school. GAD is different from normal anxiety, which everyone experiences at some point in their lives in response to specific life events and activities. Normal anxiety actually helps [place] prepare for and get through these life events and activities. Normal anxiety goes away after the event or activity is over.  GAD causes anxiety that is not necessarily related to specific events or activities. It also causes excess anxiety in proportion to specific events or activities. The anxiety associated with GAD is also difficult to control. GAD can vary from mild to severe. People with severe GAD can have intense waves of anxiety with physical symptoms (panic attacks).  SYMPTOMS The anxiety and worry associated with GAD are difficult to control. This anxiety and worry are related to many life events and activities and also occur more days than not for 6 months or longer. People with GAD also have three or more of the following symptoms (one or more in children):  Restlessness.   Fatigue.  Difficulty concentrating.   Irritability.  Muscle tension.  Difficulty sleeping or unsatisfying sleep. DIAGNOSIS GAD is diagnosed through an assessment by your health care provider. Your health care provider will ask you questions aboutyour mood,physical symptoms, and events in your life. Your health care provider may ask you about your medical history and use of alcohol or drugs, including prescription medicines. Your health care provider may also do a physical exam and blood tests. Certain medical conditions and the use of certain substances can cause symptoms similar to those associated with GAD. Your health care provider may refer you to a mental health specialist for further evaluation. TREATMENT The following therapies are usually used to treat GAD:    Medication. Antidepressant medication usually is prescribed for long-term daily control. Antianxiety medicines may be added in severe cases, especially when panic attacks occur.   Talk therapy (psychotherapy). Certain types of talk therapy can be helpful in treating GAD by providing support, education, and guidance. A form of talk therapy called cognitive behavioral therapy can teach you healthy ways to think about and react to daily life events and activities.  Stress managementtechniques. These include yoga, meditation, and exercise and can be very helpful when they are practiced regularly. A mental health specialist can help determine which treatment is best for you. Some people see improvement with one therapy. However, other people require a combination of therapies.   This information is not intended to replace advice given to you by your health care provider. Make sure you discuss any questions you have with your health care provider.   Document Released: 02/14/2013 Document Revised: 11/10/2014 Document Reviewed: 02/14/2013 Elsevier Interactive Patient Education 2016 [employer]. Dyslipidemia Dyslipidemia is an imbalance of the lipids in your blood. Lipids are waxy, fat-like proteins that your body needs in small amounts. Dyslipidemia often involves the lipids cholesterol or triglycerides. Common forms of dyslipidemia are:  High levels of bad cholesterol (LDL cholesterol). LDL cholesterol is the type of cholesterol that causes heart disease.  Low levels of good cholesterol (HDL cholesterol). HDL cholesterol is the type of cholesterol that helps protect against heart disease.  High levels of triglycerides. Triglycerides are a fatty substance in the blood linked to a buildup of plaque on your arteries. RISK FACTORS  Increased age.  Having a family history of high cholesterol.  Certain medicines, including birth control pills, diuretics, beta-blockers, and some medicines for  depression.  Smoking.  Eating a high-fat diet.  Being overweight.  Medical conditions such as diabetes, polycystic ovary syndrome, pregnancy, kidney disease, and hypothyroidism.  Lack of regular exercise. SIGNS AND SYMPTOMS There are no signs or symptoms with dyslipidemia. DIAGNOSIS A simple blood test called a fasting blood test can be done to determine your level of:  Total cholesterol. This is the combined number of LDL cholesterol and HDL cholesterol. A healthy number is lower than 200.  LDL cholesterol. The goal number for LDL cholesterol is different for each person depending on risk factors. Ask your health care provider what your LDL cholesterol number should be.  HDL cholesterol. A healthy level of HDL cholesterol is 60 or higher. A number lower than 40 for men or 50 for women is a danger sign.  Triglycerides. A healthy triglyceride number is less than 150. TREATMENT Dyslipidemia is a treatable condition. Your health care provider will advise you on what type of treatment is best based on your age, your test results, and current guidelines. Treatment may include:  Dietary changes. A dietitian may help you create a diet that is based on your risk factors, conditions, and lifestyle.  Regular exercise. This can help lower your LDL cholesterol, raise your HDL cholesterol, and help with weight management. Check with your health care provider before beginning an exercise program. Most people should participate in 30 minutes of brisk exercise 5 days a week.  Quitting smoking.  Medicines to lower LDL cholesterol and triglycerides.  If you have high levels of triglycerides, your health care provider may:  Have you stop drinking alcohol.  Have you restrict your fat intake.  Have you eliminate refined sugars from your diet.  Treat you for other conditions, such as underactive thyroid gland (hypothyroidism) and high blood sugar (hyperglycemia). Your health care provider will  monitor your lipid levels with regular blood tests. HOME CARE INSTRUCTIONS  Eat a healthy diet. Follow any diet instructions if they were given to you by your health care provider.  Maintain a healthy weight.  Exercise regularly based on the recommendations of your health care provider.  Do not use any tobacco products, including cigarettes, chewing tobacco, or electronic cigarettes.  Take medicines only as directed by your health care provider.  Keep all follow-up visits as directed by your health care provider. SEEK MEDICAL CARE IF: You are having possible side effects from your medicines.   This information is not intended to replace advice given to you by your health care provider. Make sure you discuss any questions you have with your health care provider.   Document Released: 10/25/2013 Document Revised: 11/10/2014 Document Reviewed: 10/25/2013 Elsevier Interactive Patient Education 2016 Elsevier Inc. Osteoarthritis Osteoarthritis is a disease that causes soreness and inflammation of a joint. It occurs when the cartilage at the affected joint wears down. Cartilage acts as a cushion, covering the ends of bones where they meet to form a joint. Osteoarthritis is the most common form of arthritis. It often occurs in older people. The joints affected most often by this condition include those in the:  Ends of the fingers.  Thumbs.  Neck.  Lower back.  Knees.  Hips. CAUSES  Over time, the cartilage that covers the ends of bones begins to wear away. This causes bone to rub on bone, producing pain and stiffness in the affected joints.  RISK FACTORS Certain factors can increase your chances of having osteoarthritis, including:  Older age.  Excessive body weight.  Overuse of joints.  Previous joint injury. SIGNS AND SYMPTOMS   Pain, swelling, and stiffness in the joint.  Over time, the joint may lose its normal shape.  Small deposits of bone (osteophytes) may grow on  the edges of the joint.  Bits of bone or cartilage can break off and float inside the joint space. This may cause more pain and damage. DIAGNOSIS  Your health care provider will do a physical exam and ask about your symptoms. Various tests may be ordered, such as:  X-rays of the affected joint.  Blood tests to rule out other types of arthritis. Additional tests may be used to diagnose your condition. TREATMENT  Goals of treatment are to control pain and improve joint function. Treatment plans may include:  A prescribed exercise program that allows for rest and joint relief.  A weight control plan.  Pain relief techniques, such as:  Properly applied heat and cold.  Electric pulses delivered to nerve endings under the skin (transcutaneous electrical nerve stimulation [TENS]).  Massage.  Certain nutritional supplements.  Medicines to control pain, such as:  Acetaminophen.  Nonsteroidal anti-inflammatory drugs (NSAIDs), such as naproxen.  Narcotic or central-acting agents, such as tramadol.  Corticosteroids. These can be given orally or as an injection.  Surgery to reposition the bones and relieve pain (osteotomy) or to remove loose pieces of bone and cartilage. Joint replacement may be needed in advanced states of osteoarthritis. HOME CARE INSTRUCTIONS   Take medicines only as directed by your health care provider.  Maintain a healthy weight. Follow your health care provider's instructions for weight control. This may include dietary instructions.  Exercise as directed. Your health care provider can recommend specific types of exercise. These may include:  Strengthening exercises. These are done to strengthen the muscles that support joints affected by arthritis. They can be performed with weights or with exercise bands to add resistance.  Aerobic activities. These are exercises, such as brisk walking or low-impact aerobics, that get your heart pumping.  Range-of-motion  activities. These keep your joints limber.  Balance and agility exercises. These help you maintain daily living skills.  Rest your affected joints as directed by your health care provider.  Keep all follow-up visits as directed by your health care provider. SEEK MEDICAL CARE IF:   Your skin turns red.  You develop a rash in addition to your joint pain.  You have worsening joint pain.  You have a fever along with joint or muscle aches. SEEK IMMEDIATE MEDICAL CARE IF:  You have a significant loss of weight or appetite.  You have night sweats. [place] of Arthritis and Musculoskeletal and Skin Diseases: www.niams.SouthExposed.es  [name] on Aging: http://kim-miller.com/  American College of Rheumatology: www.rheumatology.org   This information is not intended to replace advice given to you by your health care provider. Make sure you discuss any questions you have with your health care provider.   Document Released: 10/20/2005 Document Revised: 11/10/2014 Document Reviewed: 06/27/2013 Elsevier Interactive Patient Education [employer].

## 2016-07-03 NOTE — Progress Notes (Signed)
Patient is here for FU PAIN and Anxiety  Patient complains of bilateral leg pain.  Patient has not taken xarelto in a month  Patient complains of hands swelling and aching.  Patient declined the flu shot.

## 2016-07-04 NOTE — Telephone Encounter (Signed)
Pt calling back to see if Dr. Orvis BrillEdmonson would be able to fill out her disability papers based upon the notes that Dr. Philis NettlePeck sends.

## 2016-07-04 NOTE — Telephone Encounter (Signed)
Spoke with patient she will send diability papers for MD to review upon return to office.

## 2016-07-04 NOTE — Telephone Encounter (Signed)
Pt wondering if Dr. Orvis Brill has received notes from Dr. Philis Nettle. Please give her a call back

## 2016-07-08 NOTE — Telephone Encounter (Signed)
Pt calling to see if Dr. Philis NettlePeck faxed over notes, she said they were supposed to be sent on Friday. Please call back

## 2016-07-08 NOTE — Telephone Encounter (Signed)
Spoke with patient informed report not received from Dr. Venetia Constable office. Patient to call for report or bring to office.

## 2016-07-09 MED FILL — GABAPENTIN 100 MG CAPSULE: 100 | 30 days supply | Qty: 90 | Fill #0

## 2016-07-09 MED FILL — XARELTO 20 MG TABLET: 20 | 30 days supply | Qty: 30 | Fill #0

## 2016-07-09 MED FILL — ATORVASTATIN 40 MG TABLET: 40 | 30 days supply | Qty: 30 | Fill #0

## 2016-07-09 MED FILL — hydrOXYzine HCL 25 MG TABS: 25 | 10 days supply | Qty: 30 | Fill #1

## 2016-07-09 NOTE — Telephone Encounter (Signed)
Pt calling to see if we have received the fax/test results from Dr. Venetia Constable office.

## 2016-07-10 NOTE — Telephone Encounter (Signed)
Pt is returning phone call, please call back

## 2016-07-10 NOTE — Telephone Encounter (Signed)
Attempted to return MD recommendations, no answer. Left message to return call to office.

## 2016-07-10 NOTE — Telephone Encounter (Signed)
Spoke with patient reports had received Alprazolam .5 mg from Dr. Philis Nettle office because nerves had gotten so bad, taken Ysidro Evert test hand cost her $400-500 , same situation happen to her 2015 April thru aug. MD said she had anxiety and depression. Patient reports whole Right side pins and needles and back and shoulders. Request MD to set sooner appointment to evaluate for Short term disability.

## 2016-07-11 NOTE — Telephone Encounter (Signed)
Pt wondering next steps for appeal. Please call back

## 2016-07-11 NOTE — Telephone Encounter (Signed)
Pt returning phone call from the nurse.

## 2016-07-11 NOTE — Telephone Encounter (Signed)
Noted.

## 2016-07-11 NOTE — Telephone Encounter (Signed)
Spoke with patient informed of MD referral and recommendations. Patient reports will not make a follow up with doctor that can not help her. Patient says she will return to old neurologist.

## 2016-08-13 MED FILL — hydrOXYzine HCL 25 MG TABS: 25 | 10 days supply | Qty: 30 | Fill #0

## 2016-08-13 MED FILL — XARELTO 20 MG TABLET: 20 | 30 days supply | Qty: 30 | Fill #1

## 2016-08-19 ENCOUNTER — Encounter (HOSPITAL_COMMUNITY): Payer: Self-pay | Admitting: *Deleted

## 2016-08-19 ENCOUNTER — Emergency Department (HOSPITAL_COMMUNITY)
Admission: EM | Admit: 2016-08-19 | Discharge: 2016-08-20 | Disposition: A | Discharge status: Home / Self Care | Payer: Self-pay | Attending: Emergency Medicine | Admitting: Emergency Medicine

## 2016-08-19 DIAGNOSIS — M79601 Pain in right arm: Secondary | ICD-10-CM

## 2016-08-19 DIAGNOSIS — Z87891 Personal history of nicotine dependence: Secondary | ICD-10-CM | POA: Insufficient documentation

## 2016-08-19 DIAGNOSIS — Z7901 Long term (current) use of anticoagulants: Secondary | ICD-10-CM | POA: Insufficient documentation

## 2016-08-19 DIAGNOSIS — M79621 Pain in right upper arm: Secondary | ICD-10-CM | POA: Insufficient documentation

## 2016-08-19 LAB — CBC WITH DIFFERENTIAL/PLATELET
Basophils Absolute: 0.1 10*3/uL (ref 0.0–0.1)
Basophils Relative: 1 %
EOS ABS: 0.2 10*3/uL (ref 0.0–0.7)
Eosinophils Relative: 2 %
HEMATOCRIT: 38.5 % (ref 36.0–46.0)
HEMOGLOBIN: 12.6 g/dL (ref 12.0–15.0)
LYMPHS ABS: 2.9 10*3/uL (ref 0.7–4.0)
Lymphocytes Relative: 34 %
MCH: 29.6 pg (ref 26.0–34.0)
MCHC: 32.7 g/dL (ref 30.0–36.0)
MCV: 90.4 fL (ref 78.0–100.0)
Monocytes Absolute: 0.6 10*3/uL (ref 0.1–1.0)
Monocytes Relative: 7 %
NEUTROS ABS: 4.9 10*3/uL (ref 1.7–7.7)
NEUTROS PCT: 56 %
Platelets: 379 10*3/uL (ref 150–400)
RBC: 4.26 MIL/uL (ref 3.87–5.11)
RDW: 13.4 % (ref 11.5–15.5)
WBC: 8.6 10*3/uL (ref 4.0–10.5)

## 2016-08-19 LAB — COMPREHENSIVE METABOLIC PANEL
ALBUMIN: 3.8 g/dL (ref 3.5–5.0)
ALK PHOS: 98 U/L (ref 38–126)
ALT: 20 U/L (ref 14–54)
AST: 23 U/L (ref 15–41)
Anion gap: 9 (ref 5–15)
BILIRUBIN TOTAL: 0.9 mg/dL (ref 0.3–1.2)
BUN: 10 mg/dL (ref 6–20)
CALCIUM: 9.8 mg/dL (ref 8.9–10.3)
CO2: 25 mmol/L (ref 22–32)
CREATININE: 1.15 mg/dL — AB (ref 0.44–1.00)
Chloride: 104 mmol/L (ref 101–111)
GFR calc Af Amer: 60 mL/min — ABNORMAL LOW (ref >60)
GFR calc non Af Amer: 51 mL/min — ABNORMAL LOW (ref >60)
GLUCOSE: 106 mg/dL — AB (ref 65–99)
Potassium: 4 mmol/L (ref 3.5–5.1)
SODIUM: 138 mmol/L (ref 135–145)
Total Protein: 7.4 g/dL (ref 6.5–8.1)

## 2016-08-19 LAB — URINALYSIS, ROUTINE W REFLEX MICROSCOPIC
Bilirubin Urine: NEGATIVE
Glucose, UA: NEGATIVE mg/dL
Ketones, ur: NEGATIVE mg/dL
NITRITE: NEGATIVE
Protein, ur: NEGATIVE mg/dL
SPECIFIC GRAVITY, URINE: 1.021 (ref 1.005–1.030)
pH: 6 (ref 5.0–8.0)

## 2016-08-19 LAB — URINE MICROSCOPIC-ADD ON

## 2016-08-19 NOTE — ED Provider Notes (Signed)
[facility] DEPT Provider Note   CSN: [id number] Arrival date & time: 08/19/16  2133     History   Chief Complaint Chief Complaint  Patient presents with  . Arm Pain    HPI Tammy [name] is a 60 y.o. female.  HPI   Patient to the ER with complaints of chronic headaches, DVT, leukemia, PE, seasonal allergies, GAD comes to the ER with complaints of right upper arm pain for the past 3 weeks. She has had blood clots in her lungs and in her legs in the past and is concerned that she may have blood clots again. She has not been experiencing and CP or SOB but does have muscle spasms. The patient is on Xarelto, she has missed 6 weeks of medication due to being unable to afford it but has been on it now for the past 5 weeks without missing any doses recently. No fevers, N/V/D, back pain, abdominal pain, weakness, confusion, headache, numbness or tingling to the right arm.  Past Medical History:  Diagnosis Date  . Chronic headaches   . DVT (deep venous thrombosis) ([place])   . Leukemia ([place])    Told she had it at age 76, given some shots  . Pulmonary embolism ([place])   . Seasonal allergies     Patient Active Problem List   Diagnosis Date Noted  . Numbness and tingling in left upper extremity 07/03/2016  . Pulmonary embolism without acute cor pulmonale ([place]) 07/03/2016  . Positive ANA (antinuclear antibody) 12/24/2015  . Generalized anxiety disorder 12/24/2015  . Arthralgia of left lower leg 10/22/2015  . Swelling of limb 10/22/2015  . Swelling of left knee joint 10/22/2015  . Abdominal pain, left lower quadrant 03/08/2015  . Colon cancer screening 03/08/2015  . Dyslipidemia 10/09/2014  . Cervical spondylolysis 10/09/2014  . 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 URI (upper respiratory infection) 02/21/2014  . Hemoptysis 02/21/2014  . Preventative health care 02/21/2014  . DVT (deep venous thrombosis) ([place]) 08/22/2013  . LGSIL (low grade squamous intraepithelial lesion) on Pap  smear 08/04/2013  . Acute DVT (deep venous thrombosis) ([place]) 07/08/2013  . Hip pain 07/08/2013  . Sleep apnea 05/21/2011  . Dyspnea 03/11/2011    Past Surgical History:  Procedure Laterality Date  . CESAREAN SECTION     one previous  . right arm fracture    . TUBAL LIGATION      OB History    Gravida Para Term Preterm AB Living   7 4 4  0 3 4   SAB TAB Ectopic Multiple Live Births   2 0 1 0         Home Medications    Prior to Admission medications   Medication Sig Start Date End Date Taking? Authorizing Provider  atorvastatin (LIPITOR) 40 MG tablet Take 1 tablet (40 mg total) by mouth daily. 07/03/16   Tresa Garter, MD  gabapentin (NEURONTIN) 100 MG capsule Take 1 capsule (100 mg total) by mouth 3 (three) times daily. 07/03/16   Tresa Garter, MD  HYDROcodone-acetaminophen (NORCO/VICODIN) 5-325 MG tablet Take 1 tablet by mouth every 6 (six) hours as needed. 08/20/16   Riddik Senna Carlota Raspberry, PA-C  hydrOXYzine (ATARAX/VISTARIL) 25 MG tablet Take 1 tablet (25 mg total) by mouth 3 (three) times daily as needed for anxiety. 07/03/16   Tresa Garter, MD  nortriptyline (PAMELOR) 25 MG capsule Take 1 capsule (25 mg total) by mouth at bedtime. 07/03/16   Tresa Garter, MD  XARELTO 20 MG TABS  tablet TAKE 1 TABLET BY MOUTH ONCE DAILY WITH SUPPER 07/03/16   Tresa Garter, MD    Family History Family History  Problem Relation Age of Onset  . Emphysema Father   . Breast cancer Mother   . Breast cancer Maternal Grandmother   . Heart disease Sister   . Cancer Sister     Social History Social History  Substance Use Topics  . Smoking status: Former Smoker    Packs/day: 0.00    Years: 40.00    Types: Cigarettes  . Smokeless tobacco: Never Used     Comment: Patient reports smoking 4 cig/week  . Alcohol use No     Allergies   Review of patient's allergies indicates no known allergies.   Review of Systems Review of Systems Review of Systems All other  systems negative except as documented in the HPI. All pertinent positives and negatives as reviewed in the HPI.   Physical Exam Updated Vital Signs BP 124/76   Pulse 88   Temp 98.2 F (36.8 C) (Oral)   Resp 19   SpO2 99%   Physical Exam  Constitutional: She appears well-developed and well-nourished.  HENT:  Head: Normocephalic and atraumatic.  Eyes: Conjunctivae are normal. Pupils are equal, round, and reactive to light.  Neck: Trachea normal, normal range of motion and full passive range of motion without pain. Neck supple.  Cardiovascular: Normal rate, regular rhythm and normal pulses.   Pulmonary/Chest: Effort normal and breath sounds normal. Chest wall is not dull to percussion. She exhibits no tenderness, no crepitus, no edema, no deformity and no retraction.  Abdominal: Soft. Normal appearance and bowel sounds are normal.  Musculoskeletal: Normal range of motion.  Right arm is tender to the touch about the antecubital fossa medially. No rash or wound, no erythema or ecchymosis is present.  Her radial pulse is strong and symmetrical. She has CR < 2 seconds in all 5 fingers and intact sensation.  Neurological: She is alert. She has normal strength.  Skin: Skin is warm, dry and intact.  Psychiatric: She has a normal mood and affect. Her speech is normal and behavior is normal. Judgment and thought content normal. Cognition and memory are normal.    ED Treatments / Results  Labs (all labs ordered are listed, but only abnormal results are displayed) Labs Reviewed  COMPREHENSIVE METABOLIC PANEL - Abnormal; Notable for the following:       Result Value   Glucose, Bld 106 (*)    Creatinine, Ser 1.15 (*)    GFR calc non Af Amer 51 (*)    GFR calc Af Amer 60 (*)    All other components within normal limits  URINALYSIS, ROUTINE W REFLEX MICROSCOPIC (NOT AT [facility]) - Abnormal; Notable for the following:    Hgb urine dipstick MODERATE (*)    Leukocytes, UA SMALL (*)    All other  components within normal limits  URINE MICROSCOPIC-ADD ON - Abnormal; Notable for the following:    Squamous Epithelial / LPF 0-5 (*)    Bacteria, UA FEW (*)    All other components within normal limits  CBC WITH DIFFERENTIAL/PLATELET    EKG  EKG Interpretation None       Radiology No results found.  Procedures Procedures (including critical care time)  Medications Ordered in ED Medications - No data to display   Initial Impression / Assessment and Plan / ED Course  I have reviewed the triage vital signs and the nursing notes.  Pertinent  labs & imaging results that were available during my care of the patient were reviewed by me and considered in my medical decision making (see chart for details).  Clinical Course    Patient needs UE doppler of right arm, it is 0000 and the vascular US team is not currently here. Will arrange her to have it done at 8am tomorrow morning, she understands she will have to check in if positive otherwise f/u with PCP or ortho.  She is not having any claudication symptoms, CP, SOB or systemic symptoms of illness.  Final Clinical Impressions(s) / ED Diagnoses   Final diagnoses:  Right arm pain    New Prescriptions New Prescriptions   HYDROCODONE-ACETAMINOPHEN (NORCO/VICODIN) 5-325 MG TABLET    Take 1 tablet by mouth every 6 (six) hours as needed.     Delos Haring, PA-C 08/20/16 0004    Everlene Balls, MD 08/20/16 [phone]

## 2016-08-19 NOTE — ED Notes (Signed)
Dr Tomi Bamberger no d dimer ordered

## 2016-08-19 NOTE — ED Triage Notes (Signed)
Patient presents with c/o right upper arm pain for the past 3 weeks.  States has a history of blood clots 1 in her lung and 3 in her leg  States she does not feel SOB but does have "muscle spasms" in her chest (history of spasms)

## 2016-08-20 ENCOUNTER — Other Ambulatory Visit (HOSPITAL_COMMUNITY): Payer: Self-pay | Admitting: Emergency Medicine

## 2016-08-20 ENCOUNTER — Ambulatory Visit (HOSPITAL_COMMUNITY)
Admission: RE | Admit: 2016-08-20 | Discharge: 2016-08-20 | Disposition: A | Discharge status: Home / Self Care | Payer: Self-pay | Source: Ambulatory Visit | Attending: Obstetrics and Gynecology | Admitting: Obstetrics and Gynecology

## 2016-08-20 DIAGNOSIS — R52 Pain, unspecified: Secondary | ICD-10-CM

## 2016-08-20 DIAGNOSIS — M79601 Pain in right arm: Secondary | ICD-10-CM | POA: Insufficient documentation

## 2016-08-20 DIAGNOSIS — R937 Abnormal findings on diagnostic imaging of other parts of musculoskeletal system: Secondary | ICD-10-CM | POA: Insufficient documentation

## 2016-08-20 MED ORDER — HYDROCODONE-ACETAMINOPHEN 5-325 MG PO TABS: 1.0000 | ORAL_TABLET | Freq: Four times a day (QID) | ORAL | 0 refills | Status: DC | PRN

## 2016-08-20 NOTE — ED Notes (Signed)
Pt notified to return here at [place] for venous doppler

## 2016-08-20 NOTE — Progress Notes (Signed)
**  Preliminary report by tech**  Right upper extremity venous duplex completed. No evidence of deep vein or superficial thrombosis involving the right upper extremity, left subclavian vein, and left internal jugular vein.  Incidental finding of a  hypoechoic lesion of unknown etiology in the proximal forearm laterally near the elbow.  08/20/16 9:13 AM Tammy [name] RVT

## 2016-09-12 MED FILL — XARELTO 20 MG TABLET: 20 | 30 days supply | Qty: 30 | Fill #2

## 2016-09-16 ENCOUNTER — Ambulatory Visit
Admit: 2016-09-16 | Discharge: 2016-09-16 | Payer: PRIVATE HEALTH INSURANCE | Attending: Internal Medicine | Primary: Family Medicine

## 2016-09-16 DIAGNOSIS — R200 Anesthesia of skin: Secondary | ICD-10-CM

## 2016-09-16 NOTE — Progress Notes (Signed)
SPORTS MEDICINE AND PRIMARY CARE  Carol Adaoderick Zair Borawski, MD, [place]ACP, CMD  [address]  [place] [id number]  Phone:  [phone]  Fax: [phone]       Chief Complaint   Patient presents with   ??? Memory Loss   .      SUBJECTIVE:    Vanessa [name] is a 60 y.o. female Patient returns today after a long absence.  She has a long history of depression, hepatic steatosis, primary hypertension, and more recently had been under the care of neurology, Supakunya Edmonson, DO.  She is a Production designer, theatre/television/filmmanager of trust accounts at [employer] and she was evaluated for memory loss by the neurologist.  She had been referred to her by Dr. Catha GosselinBanerjee, who is her current PCP.  She's had poor memory since her mother passed in 2013/2014. MRI was performed and was unremarkable and in August she was placed on 30 day leave because of performing poorly at work.  She has been seen by mental health in the past and continues to be on Cymbalta.  She sees a Warden/rangerpsychologist, who has been completing her FMLA paperwork.  Evaluations included a chest CT, which was unremarkable, MRI of cervical spine that revealed minimal degeneration of the C5-6 and C6-7 disc levels without narrowing, and MRI that was within normal limits for age.  Neurology referred her to psychology and psychiatry for poor short term memory and checked appropriate blood studies, as well as .  Patient comes in for evaluation.    Patient has been followed by Dr. Westley HummerJoanne M. Thomas-Wilson and was seen apparently in September.  She was unable to get disability and is seeking some assistance in trying to get the psychological testing done to determine if disability is appropriate.  Patient is seen for evaluation.     She also complains of a burning feeling type sensation in the parascapular area on the left.           Current Outpatient Prescriptions   Medication Sig Dispense Refill   ??? DULoxetine (CYMBALTA) 30 mg capsule Take 30 mg by mouth daily.      ??? VAYARIN 75-8.5-21.5 mg cap Take 1 Cap by mouth two (2) times a day.     ??? progesterone (PROMETRIUM) 100 mg capsule Take 100 mg by mouth daily.     ??? valsartan-hydrochlorothiazide (DIOVAN-HCT) 160-12.5 mg per tablet Take 1 Tab by mouth daily. 30 Tab 11   ??? omeprazole (PRILOSEC) 40 mg capsule Take 1 Cap by mouth daily. 30 Cap 3   ??? nitroglycerin (NITROSTAT) 0.4 mg SL tablet 1 Tab by SubLINGual route every five (5) minutes as needed for Chest Pain. 30 Tab 2   ??? minoxidil (ROGAINE) 2 % external solution Apply  to affected area two (2) times a day. Apply to scalp and rub it in     ??? busPIRone (BUSPAR) 30 mg tablet Take 1 Tab by mouth two (2) times a day. 60 Tab 1   ??? vit B Cmplx 3-FA-Vit C-Biotin (NEPHRO VITE RX) 1-60-300 mg-mg-mcg tablet Take 1 Tab by mouth daily.     ??? CALCIUM-MAGNESIUM-VITAMIN D2 PO Take 1 Packet by mouth daily.       Past Medical History:   Diagnosis Date   ??? Depression    ??? H/O colonoscopy 09-24-12    diverticulosis   ??? Hepatic steatosis    ??? Hypertension    ??? Memory disturbance    ??? Normal cardiac stress test 02-10-12   ??? Pneumonia  Past Surgical History:   Procedure Laterality Date   ??? HX GYN      tubal ligation     Allergies   Allergen Reactions   ??? Amlodipine Other (comments)     Loss of memory,nervous,jettery   ??? Losartan Other (comments)     Nervous,jettery   ??? Nifedipine Other (comments)     Swelling,palpations         REVIEW OF SYSTEMS:  General: negative for - chills or fever  ENT: negative for - headaches, nasal congestion or tinnitus  Respiratory: negative for - cough, hemoptysis, shortness of breath or wheezing  Cardiovascular : negative for - chest pain, edema, palpitations or shortness of breath  Gastrointestinal: negative for - abdominal pain, blood in stools, heartburn or nausea/vomiting  Genito-Urinary: no dysuria, trouble voiding, or hematuria  Musculoskeletal: negative for - gait disturbance, joint pain, joint stiffness or joint swelling   Neurological: no TIA or stroke symptoms  Hematologic: no bruises, no bleeding, no swollen glands  Integument: no lumps, mole changes, nail changes or rash  Endocrine: no malaise/lethargy or unexpected weight changes      Social History     Social History   ??? Marital status: MARRIED     Spouse name: N/A   ??? Number of children: N/A   ??? Years of education: N/A     Social History Main Topics   ??? Smoking status: Never Smoker   ??? Smokeless tobacco: Never Used   ??? Alcohol use No   ??? Drug use: No   ??? Sexual activity: Yes     Partners: Male     Birth control/ protection: None     Other Topics Concern   ??? None     Social History Narrative     Family History   Problem Relation Age of Onset   ??? Heart Disease Mother    ??? Heart Disease Father        OBJECTIVE:    Visit Vitals   ??? BP 167/84   ??? Pulse 65   ??? Temp 97.5 ??F (36.4 ??C) (Oral)   ??? Resp 16   ??? Ht 5\' 7"  (1.702 m)   ??? Wt 153 lb 14.4 oz (69.8 kg)   ??? SpO2 98%   ??? BMI 24.1 kg/m2     CONSTITUTIONAL: well , well nourished, appears age appropriate  EYES: perrla, eom intact  ENMT:moist mucous membranes, pharynx clear  NECK: supple. Thyroid normal  RESPIRATORY: Chest: clear bilaterally   CARDIOVASCULAR: Heart: regular rate and rhythm  GASTROINTESTINAL: Abdomen: soft, bowel sounds active  HEMATOLOGIC: no pathological lymph nodes palpated  MUSCULOSKELETAL: Extremities: no edema, pulse 1+   INTEGUMENT: No unusual rashes or suspicious skin lesions noted. Nails appear normal.  NEUROLOGIC: non-focal exam   MENTAL STATUS: alert and oriented, appropriate affect           ASSESSMENT:  1. Numbness and tingling    2. Essential hypertension    3. Hepatic steatosis    4. Anxiety and depression    5. Memory disturbance      Patient is in need of neuropsychological testing to sort out the reason for the short term memory loss and to save her position at her place of employment.  I think she should be out of work right now until this is  resolved, but unfortunately her insurance does not agree with that and didn't agree with the psychologist.    I am not sure of the etiology of the skin  sensation.      Note blood pressure elevation today.  I'm not going to make any adjustments in medication at this point, but will repeat the blood pressure and ask her to repeat as an outpatient.  If it remains greater than 130/80 then she needs to be on antihypertensives.  We will review the medications and health history that she brings us today.  She'll return to see us in three or four weeks or after the psychological testing.        PLAN:  .  Orders Placed This Encounter   ??? REFERRAL TO PSYCHOLOGY       Follow-up Disposition:  Return in about 6 weeks (around 10/28/2016).      ATTENTION:   This medical record was transcribed using an electronic medical records system.  Although proofread, it may and can contain electronic and spelling errors.  Other human spelling and other errors may be present.  Corrections may be executed at a later time.  Please feel free to contact us for any clarifications as needed.

## 2016-09-16 NOTE — Progress Notes (Signed)
1. Have you been to the ER, urgent care clinic since your last visit?  Hospitalized since your last visit?No    2. Have you seen or consulted any other health care providers outside of the [facility] since your last visit?  Include any pap smears or colon screening. No

## 2016-09-18 ENCOUNTER — Telehealth: Payer: Self-pay | Admitting: Internal Medicine

## 2016-09-18 DIAGNOSIS — I2782 Chronic pulmonary embolism: Secondary | ICD-10-CM

## 2016-09-18 MED ORDER — XARELTO 20 MG PO TABS: ORAL_TABLET | ORAL | 3 refills | Status: DC

## 2016-09-18 NOTE — Telephone Encounter (Signed)
Xarelto refilled.

## 2016-09-18 NOTE — Telephone Encounter (Signed)
Patient called the office to request refill for XARELTO 20 MG TABS tablet. Please call prescription in to [facility] long outpatient pharmacy.  Thank you.

## 2016-09-18 NOTE — Telephone Encounter (Signed)
-----   Message from Rolin BarryLaura L [facility]-White sent at 09/18/2016 12:29 PM EST -----  Regarding: Dr.Ransom/telephone  Pt is requesting a call back before her apt tomorrow at 8:20 am . Best contact [phone].

## 2016-09-18 NOTE — Telephone Encounter (Signed)
Returned call, no answer. Left message to call back.

## 2016-09-18 NOTE — Telephone Encounter (Signed)
Pt returning phone call from the nurse.

## 2016-09-18 NOTE — Telephone Encounter (Signed)
Spoke with patient, she states she has a burning sensation to the top of her back across shoulder blades, would like to still come in and see Dr.Edmonson.

## 2016-09-19 ENCOUNTER — Ambulatory Visit
Admit: 2016-09-19 | Discharge: 2016-09-19 | Payer: PRIVATE HEALTH INSURANCE | Attending: Family Medicine | Primary: Family Medicine

## 2016-09-19 DIAGNOSIS — M5412 Radiculopathy, cervical region: Secondary | ICD-10-CM

## 2016-09-19 NOTE — Progress Notes (Signed)
Chief Complaint   Patient presents with   ??? Neurologic Problem     Shoulder pain       HPI    Vanessa [name] is a 60 year old woman whom I first evaluated a few months ago for concerns of memory loss.  I have asked her to complete neuropsych testing which she has scheduled for next month.  She is here for a new issue which is severe right shoulder/shoulder blade pain.  On further questioning it seems she has chronic neck pain but it has been worsening such that it radiates to the right shoulder and down the right arm now.  She feels hypersensitivity as well to the right shoulder blade.  Milder symptoms on the left side of the neck and left shoulder.  Symptoms keep her awake.  She feels weakness of the arm.  Sometimes the leg is involved.  She tells me the symptoms have been going on for at least 12 months if not longer.      Review of Systems   Musculoskeletal: Positive for myalgias and neck pain.   All other systems reviewed and are negative.      Past Medical History:   Diagnosis Date   ??? Depression    ??? H/O colonoscopy 09-24-12    diverticulosis   ??? Hepatic steatosis    ??? Hypertension    ??? Memory disturbance    ??? Normal cardiac stress test 02-10-12   ??? Pneumonia      Family History   Problem Relation Age of Onset   ??? Heart Disease Mother    ??? Heart Disease Father      Social History     Social History   ??? Marital status: MARRIED     Spouse name: N/A   ??? Number of children: N/A   ??? Years of education: N/A     Occupational History   ??? Not on file.     Social History Main Topics   ??? Smoking status: Never Smoker   ??? Smokeless tobacco: Never Used   ??? Alcohol use No   ??? Drug use: No   ??? Sexual activity: Yes     Partners: Male     Birth control/ protection: None     Other Topics Concern   ??? Not on file     Social History Narrative     Allergies   Allergen Reactions   ??? Amlodipine Other (comments)     Loss of memory,nervous,jettery   ??? Losartan Other (comments)     Nervous,jettery   ??? Nifedipine Other (comments)      Swelling,palpations         Current Outpatient Prescriptions   Medication Sig   ??? escitalopram oxalate (LEXAPRO) 10 mg tablet Take 10 mg by mouth daily.   ??? ALPRAZolam (XANAX) 0.5 mg tablet Take  by mouth.   ??? omeprazole (PRILOSEC) 40 mg capsule Take 1 Cap by mouth daily.   ??? nitroglycerin (NITROSTAT) 0.4 mg SL tablet 1 Tab by SubLINGual route every five (5) minutes as needed for Chest Pain.   ??? minoxidil (ROGAINE) 2 % external solution Apply  to affected area two (2) times a day. Apply to scalp and rub it in   ??? VAYARIN 75-8.5-21.5 mg cap Take 1 Cap by mouth two (2) times a day.   ??? progesterone (PROMETRIUM) 100 mg capsule Take 100 mg by mouth daily.   ??? valsartan-hydrochlorothiazide (DIOVAN-HCT) 160-12.5 mg per tablet Take 1 Tab by mouth daily.   ???  busPIRone (BUSPAR) 30 mg tablet Take 1 Tab by mouth two (2) times a day.   ??? vit B Cmplx 3-FA-Vit C-Biotin (NEPHRO VITE RX) 1-60-300 mg-mg-mcg tablet Take 1 Tab by mouth daily.   ??? CALCIUM-MAGNESIUM-VITAMIN D2 PO Take 1 Packet by mouth daily.   ??? DULoxetine (CYMBALTA) 30 mg capsule Take 30 mg by mouth daily.     No current facility-administered medications for this visit.            Neurologic Exam     Mental Status        WD/WN adult in NAD, normal grooming  VSS  A&O x 3, very flat affect.    PERRL, nonicteric  Face is symmetric, tongue midline  Speech is fluent and clear  No limb ataxia. No abnl movements.  Reduced range of motion of the right arm proximally.  Decreased strength in the biceps on the right.  Right scapula appears intact without winging.  No dermatomal rash seen.  Normal gait    CVS RRR  Lungs nonlabored  Skin is warm and dry         Visit Vitals   ??? BP 132/64   ??? Pulse 73   ??? Resp 14   ??? Ht 5\' 7"  (1.702 m)   ??? Wt 69.7 kg (153 lb 10.6 oz)   ??? SpO2 92%   ??? BMI 24.07 kg/m2       Assessment and Plan   Diagnoses and all orders for this visit:    1. Cervical radiculopathy  -     REFERRAL TO PHYSICAL THERAPY  -     MRI CERV SPINE WO CONT; Future     60 year old woman having worsening cervical spine pain with radiation down the right arm suspicious for radiculopathy.  She does have weakness on exam but this may be pain related and it is difficult to clarify.  I am going to send her to physical therapy as well as obtain an MRI of the C-spine.  Additionally I have requested she go through with the neuropsych testing.  She is also been referred to a psychiatrist which I encouraged her to follow through on.  I will see her at her next appointment.        XBMWUXLKGSupakunya Barnett ApplebaumK Jakylah Bassinger, DO  NEUROLOGIST  Diplomate ABPN

## 2016-09-19 NOTE — Patient Instructions (Signed)
A Healthy Lifestyle: Care Instructions  Your Care Instructions    A healthy lifestyle can help you feel good, stay at a healthy weight, and have plenty of energy for both work and play. A healthy lifestyle is something you can share with your whole family.  A healthy lifestyle also can lower your risk for serious health problems, such as high blood pressure, heart disease, and diabetes.  You can follow a few steps listed below to improve your health and the health of your family.  Follow-up care is a key part of your treatment and safety. Be sure to make and go to all appointments, and call your doctor if you are having problems. It's also a good idea to know your test results and keep a list of the medicines you take.  How can you care for yourself at home?  ?? Do not eat too much sugar, fat, or fast foods. You can still have dessert and treats now and then. The goal is moderation.  ?? Start small to improve your eating habits. Pay attention to portion sizes, drink less juice and soda pop, and eat more fruits and vegetables.  ?? Eat a healthy amount of food. A 3-ounce serving of meat, for example, is about the size of a deck of cards. Fill the rest of your plate with vegetables and whole grains.  ?? Limit the amount of soda and sports drinks you have every day. Drink more water when you are thirsty.  ?? Eat at least 5 servings of fruits and vegetables every day. It may seem like a lot, but it is not hard to reach this goal. A serving or helping is 1 piece of fruit, 1 cup of vegetables, or 2 cups of leafy, raw vegetables. Have an apple or some carrot sticks as an afternoon snack instead of a candy bar. Try to have fruits and/or vegetables at every meal.  ?? Make exercise part of your daily routine. You may want to start with simple activities, such as walking, bicycling, or slow swimming. Try to be active 30 to 60 minutes every day. You do not need to do all 30 to 60  minutes all at once. For example, you can exercise 3 times a day for 10 or 20 minutes. Moderate exercise is safe for most people, but it is always a good idea to talk to your doctor before starting an exercise program.  ?? Keep moving. Mow the lawn, work in the garden, or clean your house. Take the stairs instead of the elevator at work.  ?? If you smoke, quit. People who smoke have an increased risk for heart attack, stroke, cancer, and other lung illnesses. Quitting is hard, but there are ways to boost your chance of quitting tobacco for good.  ?? Use nicotine gum, patches, or lozenges.  ?? Ask your doctor about stop-smoking programs and medicines.  ?? Keep trying.  In addition to reducing your risk of diseases in the future, you will notice some benefits soon after you stop using tobacco. If you have shortness of breath or asthma symptoms, they will likely get better within a few weeks after you quit.  ?? Limit how much alcohol you drink. Moderate amounts of alcohol (up to 2 drinks a day for men, 1 drink a day for women) are okay. But drinking too much can lead to liver problems, high blood pressure, and other health problems.  Family health  If you have a family, there are many things you   can do together to improve your health.  ?? Eat meals together as a family as often as possible.  ?? Eat healthy foods. This includes fruits, vegetables, lean meats and dairy, and whole grains.  ?? Include your family in your fitness plan. Most people think of activities such as jogging or tennis as the way to fitness, but there are many ways you and your family can be more active. Anything that makes you breathe hard and gets your heart pumping is exercise. Here are some tips:  ?? Walk to do errands or to take your child to school or the bus.  ?? Go for a family bike ride after dinner instead of watching TV.  Where can you learn more?  Go to http://www.healthwise.net/GoodHelpConnections.   Enter U807 in the search box to learn more about "A Healthy Lifestyle: Care Instructions."  Current as of: Mar 14, 2016  Content Version: 11.4  ?? 2006-2017 Healthwise, Incorporated. Care instructions adapted under license by [employer] (which disclaims liability or warranty for this information). If you have questions about a medical condition or this instruction, always ask your healthcare professional. Healthwise, Incorporated disclaims any warranty or liability for your use of this information.

## 2016-09-19 NOTE — Progress Notes (Signed)
Patient is here for follow up for nerve pain in shoulders  Patient having a lot of pain today  states clothes are bothering her

## 2016-09-26 ENCOUNTER — Inpatient Hospital Stay: Admit: 2016-09-26 | Payer: BLUE CROSS/BLUE SHIELD | Attending: Family Medicine | Primary: Family Medicine

## 2016-09-26 DIAGNOSIS — M4722 Other spondylosis with radiculopathy, cervical region: Secondary | ICD-10-CM

## 2016-09-29 NOTE — Telephone Encounter (Signed)
Spoke with patient, informed her per Dr.Edmonson-The MRI of her neck shows some degenerative changes but nothing severe.  No herniated disc.  If she is having a lot of pain she may need to discuss with her primary care about pain management since I do not do that.    She verbalized understanding, states she'll send a form filled out stating [facility].

## 2016-09-29 NOTE — Telephone Encounter (Signed)
The MRI of her neck shows some degenerative changes but nothing severe.  No herniated disc.  If she is having a lot of pain she may need to discuss with her primary care about pain management since I do not do that.

## 2016-09-29 NOTE — Telephone Encounter (Signed)
Pt calling in regards to MRI that was done on Friday. Pt stated that she is in a lot of pain and would like a returned phone call.

## 2016-09-30 NOTE — Telephone Encounter (Signed)
Spoke with patient, she would like to know if the MRI would show why she is having pain to right shoulder blade. She would like to speak with Dr.Edmonson to further explain what "degenerative changes" means in her MRI report.

## 2016-10-07 ENCOUNTER — Encounter: Attending: Clinical Neuropsychologist | Primary: Family Medicine

## 2016-10-20 MED FILL — hydrOXYzine HCL 25 MG TABS: 25 | 10 days supply | Qty: 30 | Fill #1

## 2016-10-20 MED FILL — XARELTO 20 MG TABLET: 20 | 30 days supply | Qty: 30 | Fill #0

## 2016-10-30 ENCOUNTER — Encounter: Admit: 2016-10-30 | Primary: Family Medicine

## 2016-10-30 ENCOUNTER — Ambulatory Visit
Admit: 2016-10-30 | Discharge: 2016-10-30 | Payer: PRIVATE HEALTH INSURANCE | Attending: Internal Medicine | Primary: Family Medicine

## 2016-10-30 ENCOUNTER — Other Ambulatory Visit: Payer: Self-pay

## 2016-10-30 ENCOUNTER — Ambulatory Visit: Payer: Self-pay | Attending: Internal Medicine | Admitting: Internal Medicine

## 2016-10-30 ENCOUNTER — Encounter: Payer: Self-pay | Admitting: Internal Medicine

## 2016-10-30 VITALS — BP 148/84 | HR 114 | Temp 98.4°F | Resp 18 | Ht 62.0 in | Wt 191.6 lb

## 2016-10-30 DIAGNOSIS — J209 Acute bronchitis, unspecified: Secondary | ICD-10-CM

## 2016-10-30 DIAGNOSIS — Z86711 Personal history of pulmonary embolism: Secondary | ICD-10-CM | POA: Insufficient documentation

## 2016-10-30 DIAGNOSIS — I10 Essential (primary) hypertension: Secondary | ICD-10-CM | POA: Insufficient documentation

## 2016-10-30 DIAGNOSIS — Z7901 Long term (current) use of anticoagulants: Secondary | ICD-10-CM | POA: Insufficient documentation

## 2016-10-30 DIAGNOSIS — F411 Generalized anxiety disorder: Secondary | ICD-10-CM | POA: Insufficient documentation

## 2016-10-30 DIAGNOSIS — Z79899 Other long term (current) drug therapy: Secondary | ICD-10-CM | POA: Insufficient documentation

## 2016-10-30 DIAGNOSIS — M25521 Pain in right elbow: Secondary | ICD-10-CM | POA: Insufficient documentation

## 2016-10-30 DIAGNOSIS — Z86718 Personal history of other venous thrombosis and embolism: Secondary | ICD-10-CM | POA: Insufficient documentation

## 2016-10-30 DIAGNOSIS — E785 Hyperlipidemia, unspecified: Secondary | ICD-10-CM | POA: Insufficient documentation

## 2016-10-30 MED ORDER — CEFUROXIME AXETIL 500 MG TAB
500 mg | ORAL_TABLET | Freq: Two times a day (BID) | ORAL | 0 refills | Status: AC
Start: 2016-10-30 — End: 2016-11-09

## 2016-10-30 MED ORDER — ACETAMINOPHEN-CODEINE #3 300-30 MG PO TABS: 1.0000 | ORAL_TABLET | ORAL | 0 refills | Status: DC | PRN

## 2016-10-30 NOTE — Progress Notes (Signed)
Patient is here for F/Up Anxiety  Patient also stated that she been having pain in her right shoulder/elbow she has a pain scale of an 8.  Patient declined the flu shot today.

## 2016-10-30 NOTE — Patient Instructions (Signed)
Elbow Bursitis Introduction A bursa is a fluid-filled sac that covers and protects a joint. Bursitis is when the fluid-filled sac gets puffy and sore (inflamed). Elbow bursitis, also called olecranon bursitis, happens over your elbow. This may be caused by:  Injury (acute trauma) to your elbow.  Leaning on hard surfaces for long periods of time.  Infection from an injury that breaks the skin near your elbow.  A bone growth (spur) that forms at the tip of your elbow.  A medical condition that causes inflammation in your body, such as:  Gout.  Rheumatoid arthritis. Sometimes the cause is not known. Follow these instructions at home:  Take medicines only as told by your doctor.  If you were prescribed an antibiotic medicine, finish all of it even if you start to feel better.  If your bursitis is caused by an injury, rest your elbow and wear your bandage as told by your doctor. You may also apply ice to the injured area as told by your doctor:  Put ice in a plastic bag.  Place a towel between your skin and the bag.  Leave the ice on for 20 minutes, 2-3 times per day.  Do not do any activities that cause pain to your elbow.  Use elbow pads or wraps to cushion your elbow. Contact a doctor if:  You have a fever.  Your symptoms do not get better with treatment.  Your pain or swelling gets worse.  Your pain or swelling goes away and then comes back.  You have drainage of pus from the swollen area over your elbow. This information is not intended to replace advice given to you by your health care provider. Make sure you discuss any questions you have with your health care provider. Document Released: 04/09/2010 Document Revised: 03/27/2016 Document Reviewed: 06/28/2014  2017 Elsevier Generalized Anxiety Disorder Generalized anxiety disorder (GAD) is a mental disorder. It interferes with life functions, including relationships, work, and school. GAD is different from normal  anxiety, which everyone experiences at some point in their lives in response to specific life events and activities. Normal anxiety actually helps [place] prepare for and get through these life events and activities. Normal anxiety goes away after the event or activity is over.  GAD causes anxiety that is not necessarily related to specific events or activities. It also causes excess anxiety in proportion to specific events or activities. The anxiety associated with GAD is also difficult to control. GAD can vary from mild to severe. People with severe GAD can have intense waves of anxiety with physical symptoms (panic attacks).  SYMPTOMS The anxiety and worry associated with GAD are difficult to control. This anxiety and worry are related to many life events and activities and also occur more days than not for 6 months or longer. People with GAD also have three or more of the following symptoms (one or more in children):  Restlessness.   Fatigue.  Difficulty concentrating.   Irritability.  Muscle tension.  Difficulty sleeping or unsatisfying sleep. DIAGNOSIS GAD is diagnosed through an assessment by your health care provider. Your health care provider will ask you questions aboutyour mood,physical symptoms, and events in your life. Your health care provider may ask you about your medical history and use of alcohol or drugs, including prescription medicines. Your health care provider may also do a physical exam and blood tests. Certain medical conditions and the use of certain substances can cause symptoms similar to those associated with GAD. Your health care  provider may refer you to a mental health specialist for further evaluation. TREATMENT The following therapies are usually used to treat GAD:   Medication. Antidepressant medication usually is prescribed for long-term daily control. Antianxiety medicines may be added in severe cases, especially when panic attacks occur.   Talk therapy  (psychotherapy). Certain types of talk therapy can be helpful in treating GAD by providing support, education, and guidance. A form of talk therapy called cognitive behavioral therapy can teach you healthy ways to think about and react to daily life events and activities.  Stress managementtechniques. These include yoga, meditation, and exercise and can be very helpful when they are practiced regularly. A mental health specialist can help determine which treatment is best for you. Some people see improvement with one therapy. However, other people require a combination of therapies. This information is not intended to replace advice given to you by your health care provider. Make sure you discuss any questions you have with your health care provider. Document Released: 02/14/2013 Document Revised: 11/10/2014 Document Reviewed: 02/14/2013 Elsevier Interactive Patient Education  2017 [employer].

## 2016-10-30 NOTE — Progress Notes (Signed)
Tammy [name], is a 60 y.o. female  [id number]  [id number]  DOB - 1956-04-08  Chief Complaint  Patient presents with  . Anxiety       Subjective:   Tammy [name] is a 60 y.o. female with history of HTN, DVT and pulmonary embolism on Xarelto here today for a follow up visit. Patient was seen in the ED recently for pain in her right elbow joint concerning for clot. She reported no injury or fall. Vascular Ultrasound done in the ED showed "No evidence of deep vein or superficial thrombosis involving the right upper extremity, left subclavian vein, and left internal jugular vein. Incidental finding of a hypoechoic lesion of unknown etiology in the proximal forearm laterally near the elbow". Pain has persisted, no X Ray was done. Patient is doing better with her anxiety, she has developed several coping mechanisms including sitting at the back of vehicle to avoid seeing moving ones that gives her phobia. She also claims hydroxyzine helps a lot. She claims compliant with her medications, reports no side effect. She needs handicap packing permit filled out today. She has applied for disability, awaiting ruling. Patient has No headache, No chest pain, No abdominal pain - No Nausea, No new weakness tingling or numbness, No Cough - SOB.  Problem  Right Elbow Pain    ALLERGIES: No Known Allergies  PAST MEDICAL HISTORY: Past Medical History:  Diagnosis Date  . Chronic headaches   . DVT (deep venous thrombosis) ([place])   . Leukemia ([place])    Told she had it at age 77, given some shots  . Pulmonary embolism ([employer])   . Seasonal allergies     MEDICATIONS AT HOME: Prior to Admission medications   Medication Sig Start Date End Date Taking? Authorizing Provider  gabapentin (NEURONTIN) 100 MG capsule Take 1 capsule (100 mg total) by mouth 3 (three) times daily. 07/03/16  Yes Camil Hausmann E Polk Minor, MD  XARELTO 20 MG TABS tablet TAKE 1 TABLET BY MOUTH ONCE DAILY WITH SUPPER 09/18/16  Yes Shakim Faith  E Doreene Burke, MD  acetaminophen-codeine (TYLENOL #3) 300-30 MG tablet Take 1 tablet by mouth every 4 (four) hours as needed. 10/30/16   Tresa Garter, MD  atorvastatin (LIPITOR) 40 MG tablet Take 1 tablet (40 mg total) by mouth daily. 07/03/16   Tresa Garter, MD  HYDROcodone-acetaminophen (NORCO/VICODIN) 5-325 MG tablet Take 1 tablet by mouth every 6 (six) hours as needed. 08/20/16   Tiffany Carlota Raspberry, PA-C  hydrOXYzine (ATARAX/VISTARIL) 25 MG tablet Take 1 tablet (25 mg total) by mouth 3 (three) times daily as needed for anxiety. 07/03/16   Tresa Garter, MD  nortriptyline (PAMELOR) 25 MG capsule Take 1 capsule (25 mg total) by mouth at bedtime. 07/03/16   Tresa Garter, MD    Objective:   Vitals:   10/30/16 0944  BP: (!) 148/84  Pulse: (!) 114  Resp: 18  Temp: 98.4 F (36.9 C)  TempSrc: Oral  SpO2: 98%  Weight: 191 lb 9.6 oz (86.9 kg)  Height: 5\' 2"  (1.575 m)   Exam General appearance : Awake, alert, not in any distress. Speech Clear. Not toxic looking, obese HEENT: Atraumatic and Normocephalic, pupils equally reactive to light and accomodation Neck: Supple, no JVD. No cervical lymphadenopathy.  Chest: Good air entry bilaterally, no added sounds  CVS: S1 S2 regular, no murmurs.  Abdomen: Bowel sounds present, Non tender and not distended with no gaurding, rigidity or rebound. Extremities: B/L Lower Ext shows no edema, both legs are  warm to touch Neurology: Awake alert, and oriented X 3, CN II-XII intact, Non focal Skin: No Rash  Data Review Lab Results  Component Value Date   HGBA1C 5.70 10/22/2015   HGBA1C 5.90 03/08/2015   HGBA1C  07/04/2007    5.7 (NOTE)   The ADA recommends the following therapeutic goals for glycemic   control related to Hgb A1C measurement:   Goal of Therapy:   < 7.0% Hgb A1C   Action Suggested:  > 8.0% Hgb A1C   Ref:  Diabetes Care, 22, Suppl. 1, 1999    Assessment & Plan   1. Right elbow pain  - X Ray Right Elbow - Ambulatory  referral to Orthopedic Surgery - acetaminophen-codeine (TYLENOL #3) 300-30 MG tablet; Take 1 tablet by mouth every 4 (four) hours as needed.  Dispense: 60 tablet; Refill: 0  2. Dyslipidemia  To address this please limit saturated fat to no more than 7% of your calories, limit cholesterol to 200 mg/day, increase fiber and exercise as tolerated. If needed we may add another cholesterol lowering medication to your regimen.   3. Generalized anxiety disorder   - Patient doing much better, says hydroxyzine helps a lot - She has also developed coping mechanisms, - Still sees our LCSW for counseling and support  Patient have been counseled extensively about nutrition and exercise. Other issues discussed during this visit include: low cholesterol diet, weight control and daily exercise,  importance of adherence with medications and regular follow-up. We also discussed long term complications of uncontrolled hypertension.   Return in about 3 months (around 01/28/2017) for Follow up HTN, Follow up Pain and comorbidities, Generalized Anxiety Disorder.  The patient was given clear instructions to go to ER or return to medical center if symptoms don't improve, worsen or new problems develop. The patient verbalized understanding. The patient was told to call to get lab results if they haven't heard anything in the next week.   This note has been created with Surveyor, quantity. Any transcriptional errors are unintentional.    Angelica Chessman, MD, [place], Karilyn Cota, [place] [facility] [place], [place]   10/30/2016, 11:39 AM

## 2016-10-30 NOTE — Progress Notes (Signed)
SPORTS MEDICINE AND PRIMARY [facility]ARE  Carol Adaoderick Aryona Sill, MD, ToluFACP, CMD  [address]  [place] [id number]  Phone:  [phone]  Fax: [phone]       Chief Complaint   Patient presents with   ??? Hypertension     f/u   .      SUBJECTIVE:    Vanessa [name] is a 60 y.o. female Patient returns today with known history of anxiety and depression, primary hypertension, hepatic steatosis, memory disturbance, and is seen for evaluation. Since we last saw her she has been seen by Lossie FaesSupakunya Edmonson, DO for concerns related to memory loss.  She requests a neuro psych testing, which should have been done sometime this month, although I don't have the result, and she was felt to have cervical radiculopathy, for which we gave her referral for physical therapy and requested an MRI of the cervical spine, which was completed on 09/26/16 and revealed no significant herniation or spinal stenosis. There was mild C5-6 and C6-7 degenerative changes without progression since 2014.  Other medical problems include hypertension, hepatic steatosis and depression.    Patient currently complains of chest cold with a cough productive of mucopurulent sputum as well as pleuritic chest discomfort and is seen for evaluation.  She's had some fever and chills.           Current Outpatient Prescriptions   Medication Sig Dispense Refill   ??? cefUROXime (CEFTIN) 500 mg tablet Take 1 Tab by mouth two (2) times a day for 10 days. 20 Tab 0   ??? escitalopram oxalate (LEXAPRO) 10 mg tablet Take 10 mg by mouth daily.     ??? minoxidil (ROGAINE) 2 % external solution Apply  to affected area two (2) times a day. Apply to scalp and rub it in     ??? VAYARIN 75-8.5-21.5 mg cap Take 1 Cap by mouth two (2) times a day.     ??? valsartan-hydrochlorothiazide (DIOVAN-HCT) 160-12.5 mg per tablet Take 1 Tab by mouth daily. 30 Tab 11   ??? CALCIUM-MAGNESIUM-VITAMIN D2 PO Take 1 Packet by mouth daily.       Past Medical History:   Diagnosis Date   ??? Depression     ??? H/O colonoscopy 09-24-12    diverticulosis   ??? Hepatic steatosis    ??? Hypertension    ??? Memory disturbance    ??? Normal cardiac stress test 02-10-12   ??? Pneumonia      Past Surgical History:   Procedure Laterality Date   ??? HX GYN      tubal ligation     Allergies   Allergen Reactions   ??? Amlodipine Other (comments)     Loss of memory,nervous,jettery   ??? Losartan Other (comments)     Nervous,jettery   ??? Nifedipine Other (comments)     Swelling,palpations         REVIEW OF SYSTEMS:  General: negative for - chills or fever  ENT: negative for - headaches, nasal congestion or tinnitus  Respiratory: negative for - cough, hemoptysis, shortness of breath or wheezing  Cardiovascular : negative for - chest pain, edema, palpitations or shortness of breath  Gastrointestinal: negative for - abdominal pain, blood in stools, heartburn or nausea/vomiting  Genito-Urinary: no dysuria, trouble voiding, or hematuria  Musculoskeletal: negative for - gait disturbance, joint pain, joint stiffness or joint swelling  Neurological: no TIA or stroke symptoms  Hematologic: no bruises, no bleeding, no swollen glands  Integument: no lumps, mole changes,  nail changes or rash  Endocrine: no malaise/lethargy or unexpected weight changes      Social History     Social History   ??? Marital status: MARRIED     Spouse name: N/A   ??? Number of children: N/A   ??? Years of education: N/A     Social History Main Topics   ??? Smoking status: Never Smoker   ??? Smokeless tobacco: Never Used   ??? Alcohol use No   ??? Drug use: No   ??? Sexual activity: Yes     Partners: Male     Birth control/ protection: None     Other Topics Concern   ??? None     Social History Narrative     Family History   Problem Relation Age of Onset   ??? Heart Disease Mother    ??? Heart Disease Father        OBJECTIVE:    Visit Vitals   ??? BP 135/83   ??? Pulse 69   ??? Temp 97.6 ??F (36.4 ??C) (Oral)   ??? Resp 18   ??? Ht 5\' 7"  (1.702 m)   ??? Wt 156 lb 4.8 oz (70.9 kg)   ??? SpO2 96%   ??? BMI 24.48 kg/m2      CONSTITUTIONAL: well , well nourished, appears age appropriate  EYES: perrla, eom intact  ENMT:moist mucous membranes, pharynx clear  NECK: supple. Thyroid normal  RESPIRATORY: Chest: clear bilaterally   CARDIOVASCULAR: Heart: regular rate and rhythm  GASTROINTESTINAL: Abdomen: soft, bowel sounds active  HEMATOLOGIC: no pathological lymph nodes palpated  MUSCULOSKELETAL: Extremities: no edema, pulse 1+   INTEGUMENT: No unusual rashes or suspicious skin lesions noted. Nails appear normal.  NEUROLOGIC: non-focal exam   MENTAL STATUS: alert and oriented, appropriate affect           ASSESSMENT:  1. Acute bronchitis, unspecified organism    2. Anxiety and depression    3. Essential hypertension    4. Hepatic steatosis    5. Chronic bilateral low back pain without sciatica      She has an acute bronchitis, for which we'll ask for a chest xray with her history to rule out pneumonia. She'll be treated with cough expectorant, as well as antibiotics.    Blood pressure control is acceptable.    BMI reflects ideal body weight.  Mammograms are performed at [facility] and were performed this summer.  She saw Dr. Ishmael HolterNeale about a month ago, who also orders and follows the mammograms. She'll return to our office in four to six months, sooner if she has any problems.        PLAN:  .  Orders Placed This Encounter   ??? XR CHEST PA LAT   ??? URINALYSIS W/ RFLX MICROSCOPIC   ??? CBC WITH AUTOMATED DIFF   ??? METABOLIC PANEL, COMPREHENSIVE   ??? LIPID PANEL   ??? TSH 3RD GENERATION   ??? HEMOGLOBIN A1C WITH EAG   ??? FSH AND LH   ??? cefUROXime (CEFTIN) 500 mg tablet       Follow-up Disposition:  Return in about 4 months (around 02/28/2017).      ATTENTION:   This medical record was transcribed using an electronic medical records system.  Although proofread, it may and can contain electronic and spelling errors.  Other human spelling and other errors may be present.  Corrections may be executed at a later time.  Please feel free to contact  us for any clarifications as needed.

## 2016-10-30 NOTE — Progress Notes (Signed)
1. Have you been to the ER, urgent care clinic since your last visit?  Hospitalized since your last visit?No    2. Have you seen or consulted any other [facility] providers [facility] since your last visit?  Include any pap smears or colon screening. No     Complaints of pain in right side of body and cold symptoms.  Wants to discuss annual blood work

## 2016-10-31 ENCOUNTER — Emergency Department (HOSPITAL_COMMUNITY)
Admission: EM | Admit: 2016-10-31 | Discharge: 2016-10-31 | Disposition: A | Discharge status: Home / Self Care | Payer: Self-pay | Attending: Emergency Medicine | Admitting: Emergency Medicine

## 2016-10-31 ENCOUNTER — Ambulatory Visit (HOSPITAL_COMMUNITY)
Admission: RE | Admit: 2016-10-31 | Discharge: 2016-10-31 | Disposition: A | Discharge status: Home / Self Care | Payer: Self-pay | Source: Ambulatory Visit | Attending: Internal Medicine | Admitting: Internal Medicine

## 2016-10-31 ENCOUNTER — Encounter (HOSPITAL_COMMUNITY): Payer: Self-pay | Admitting: Emergency Medicine

## 2016-10-31 DIAGNOSIS — Z87891 Personal history of nicotine dependence: Secondary | ICD-10-CM | POA: Insufficient documentation

## 2016-10-31 DIAGNOSIS — M25521 Pain in right elbow: Secondary | ICD-10-CM

## 2016-10-31 DIAGNOSIS — Z5321 Procedure and treatment not carried out due to patient leaving prior to being seen by health care provider: Secondary | ICD-10-CM | POA: Insufficient documentation

## 2016-10-31 DIAGNOSIS — Z7902 Long term (current) use of antithrombotics/antiplatelets: Secondary | ICD-10-CM | POA: Insufficient documentation

## 2016-10-31 DIAGNOSIS — Z139 Encounter for screening, unspecified: Secondary | ICD-10-CM

## 2016-10-31 DIAGNOSIS — M19021 Primary osteoarthritis, right elbow: Secondary | ICD-10-CM | POA: Insufficient documentation

## 2016-10-31 DIAGNOSIS — Z79899 Other long term (current) drug therapy: Secondary | ICD-10-CM | POA: Insufficient documentation

## 2016-10-31 DIAGNOSIS — M25511 Pain in right shoulder: Secondary | ICD-10-CM | POA: Insufficient documentation

## 2016-10-31 LAB — FSH AND LH
FSH: 80.4 m[IU]/mL
Luteinizing hormone: 40.7 m[IU]/mL

## 2016-10-31 LAB — LIPID PANEL
Cholesterol, total: 249 mg/dL — ABNORMAL HIGH (ref 100–199)
HDL Cholesterol: 88 mg/dL (ref >39)
LDL, calculated: 144 mg/dL — ABNORMAL HIGH (ref 0–99)
Triglyceride: 86 mg/dL (ref 0–149)
VLDL, calculated: 17 mg/dL (ref 5–40)

## 2016-10-31 LAB — TSH 3RD GENERATION: TSH: 1.32 u[IU]/mL (ref 0.450–4.500)

## 2016-10-31 LAB — METABOLIC PANEL, COMPREHENSIVE
A-G Ratio: 1.4 (ref 1.2–2.2)
ALT (SGPT): 55 IU/L — ABNORMAL HIGH (ref 0–32)
AST (SGOT): 38 IU/L (ref 0–40)
Albumin: 4.5 g/dL (ref 3.5–5.5)
Alk. phosphatase: 78 IU/L (ref 39–117)
BUN/Creatinine ratio: 15 (ref 9–23)
BUN: 15 mg/dL (ref 6–24)
Bilirubin, total: 0.4 mg/dL (ref 0.0–1.2)
CO2: 23 mmol/L (ref 18–29)
Calcium: 10.1 mg/dL (ref 8.7–10.2)
Chloride: 100 mmol/L (ref 96–106)
Creatinine: 1.01 mg/dL — ABNORMAL HIGH (ref 0.57–1.00)
GFR est AA: 70 mL/min/{1.73_m2} (ref >59)
GFR est non-AA: 61 mL/min/{1.73_m2} (ref >59)
GLOBULIN, TOTAL: 3.2 g/dL (ref 1.5–4.5)
Glucose: 80 mg/dL (ref 65–99)
Potassium: 4.1 mmol/L (ref 3.5–5.2)
Protein, total: 7.7 g/dL (ref 6.0–8.5)
Sodium: 141 mmol/L (ref 134–144)

## 2016-10-31 LAB — MICROSCOPIC EXAMINATION
Casts: NONE SEEN /lpf
Epithelial cells: >10 /hpf — AB (ref 0–10)

## 2016-10-31 LAB — CBC WITH AUTOMATED DIFF
ABS. BASOPHILS: 0 10*3/uL (ref 0.0–0.2)
ABS. EOSINOPHILS: 0.2 10*3/uL (ref 0.0–0.4)
ABS. IMM. GRANS.: 0 10*3/uL (ref 0.0–0.1)
ABS. MONOCYTES: 0.5 10*3/uL (ref 0.1–0.9)
ABS. NEUTROPHILS: 3.2 10*3/uL (ref 1.4–7.0)
Abs Lymphocytes: 1.7 10*3/uL (ref 0.7–3.1)
BASOPHILS: 1 %
EOSINOPHILS: 3 %
HCT: 46.8 % — ABNORMAL HIGH (ref 34.0–46.6)
HGB: 15.9 g/dL (ref 11.1–15.9)
IMMATURE GRANULOCYTES: 0 %
Lymphocytes: 30 %
MCH: 32.4 pg (ref 26.6–33.0)
MCHC: 34 g/dL (ref 31.5–35.7)
MCV: 95 fL (ref 79–97)
MONOCYTES: 10 %
NEUTROPHILS: 56 %
PLATELET: 309 10*3/uL (ref 150–379)
RBC: 4.91 x10E6/uL (ref 3.77–5.28)
RDW: 13.5 % (ref 12.3–15.4)
WBC: 5.6 10*3/uL (ref 3.4–10.8)

## 2016-10-31 LAB — URINALYSIS W/ RFLX MICROSCOPIC
Bilirubin: NEGATIVE
Blood: NEGATIVE
Glucose: NEGATIVE
Ketone: NEGATIVE
Nitrites: NEGATIVE
Protein: NEGATIVE
Specific Gravity: 1.019 (ref 1.005–1.030)
Urobilinogen: 0.2 mg/dL (ref 0.2–1.0)
pH (UA): 6.5 (ref 5.0–7.5)

## 2016-10-31 LAB — HEMOGLOBIN A1C WITH EAG
Estimated average glucose: 105 mg/dL
Hemoglobin A1c: 5.3 % (ref 4.8–5.6)

## 2016-10-31 MED FILL — ACETAMINOPHEN/COD #3 TABLET: 300-30 | 10 days supply | Qty: 60 | Fill #0

## 2016-10-31 NOTE — ED Triage Notes (Signed)
Pt verbalizes seen at PCP for check up and referred here for "xray of my heart."  Pt denies pain or symptoms. Pt on blood thinners related to hx of HTN, DVT, and PE.

## 2016-10-31 NOTE — ED Provider Notes (Signed)
MSE was initiated and I personally evaluated the patient and placed orders (if any) at  12:30 PM on October 31, 2016.  Patient presents to the emergency department for imaging. She believed it was to be of her heart but on review in the records it is for her right elbow. She has no acute chest complaints and states she has had right elbow and shoulder pain since October and was seen by her primary care provider yesterday and was referred for x-rays. It appears the patient presented to the wrong department as she has no acute complaints. Plan to discharge to outpatient imaging and follow-up with her primary care provider.      Quintella Reichert, MD 10/31/16 1231

## 2016-10-31 NOTE — ED Notes (Signed)
Per Pt, she was sent for "an xray of her heart."    However, chart review of notes from visit yesterday and outpatient radiology orders reflect an elbow xray.

## 2016-10-31 NOTE — ED Notes (Addendum)
Per EDP, Pt should not have been checked in.  Pt only here for Outpatient X ray.   Pt to be dismissed.

## 2016-11-06 ENCOUNTER — Telehealth: Payer: Self-pay | Admitting: *Deleted

## 2016-11-06 NOTE — Telephone Encounter (Signed)
MA unable to leave a VM due to phone ringing continuously and disconnecting.

## 2016-11-06 NOTE — Telephone Encounter (Signed)
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 Follow up with Orthopedic Surgery as discussed

## 2016-11-24 ENCOUNTER — Encounter: Attending: Family Medicine | Primary: Family Medicine

## 2016-12-09 MED FILL — XARELTO 20 MG TABLET: 20 | 30 days supply | Qty: 30 | Fill #1

## 2017-01-08 MED FILL — hydrOXYzine HCL 25 MG TABS: 25 | 10 days supply | Qty: 30 | Fill #2

## 2017-01-08 MED FILL — XARELTO 20 MG TABLET: 20 | 30 days supply | Qty: 30 | Fill #2

## 2017-01-08 MED FILL — ATORVASTATIN 40 MG TABLET: 40 | 30 days supply | Qty: 30 | Fill #1

## 2017-02-09 MED FILL — XARELTO 20 MG TABLET: 20 | 30 days supply | Qty: 30 | Fill #3

## 2017-03-19 MED FILL — hydrOXYzine HCL 25 MG TABS: 25 | 10 days supply | Qty: 30 | Fill #3

## 2017-03-19 MED FILL — XARELTO 20 MG TABLET: 20 | 30 days supply | Qty: 30 | Fill #4

## 2017-04-20 ENCOUNTER — Encounter: Attending: Internal Medicine | Primary: Family Medicine

## 2017-04-22 ENCOUNTER — Ambulatory Visit: Payer: Self-pay | Admitting: Internal Medicine

## 2017-04-24 ENCOUNTER — Telehealth: Payer: Self-pay | Admitting: Internal Medicine

## 2017-04-24 MED FILL — XARELTO 20 MG TABLET: 20 | 30 days supply | Qty: 30 | Fill #5

## 2017-04-24 NOTE — Telephone Encounter (Signed)
Pt calling to request a refill of hydroxyzine to be sent to [facility]. Please f/u with pt. Thank you.

## 2017-05-13 ENCOUNTER — Encounter: Payer: Self-pay | Admitting: Internal Medicine

## 2017-05-13 ENCOUNTER — Ambulatory Visit: Payer: Self-pay | Attending: Internal Medicine | Admitting: Internal Medicine

## 2017-05-13 VITALS — BP 121/75 | HR 103 | Temp 97.6°F | Resp 18 | Ht 62.0 in | Wt 191.0 lb

## 2017-05-13 DIAGNOSIS — M79662 Pain in left lower leg: Secondary | ICD-10-CM | POA: Insufficient documentation

## 2017-05-13 DIAGNOSIS — Z1211 Encounter for screening for malignant neoplasm of colon: Secondary | ICD-10-CM

## 2017-05-13 DIAGNOSIS — F411 Generalized anxiety disorder: Secondary | ICD-10-CM | POA: Insufficient documentation

## 2017-05-13 DIAGNOSIS — Z124 Encounter for screening for malignant neoplasm of cervix: Secondary | ICD-10-CM

## 2017-05-13 DIAGNOSIS — Z7901 Long term (current) use of anticoagulants: Secondary | ICD-10-CM | POA: Insufficient documentation

## 2017-05-13 DIAGNOSIS — R200 Anesthesia of skin: Secondary | ICD-10-CM | POA: Insufficient documentation

## 2017-05-13 DIAGNOSIS — R202 Paresthesia of skin: Secondary | ICD-10-CM | POA: Insufficient documentation

## 2017-05-13 DIAGNOSIS — I10 Essential (primary) hypertension: Secondary | ICD-10-CM | POA: Insufficient documentation

## 2017-05-13 DIAGNOSIS — E785 Hyperlipidemia, unspecified: Secondary | ICD-10-CM | POA: Insufficient documentation

## 2017-05-13 DIAGNOSIS — I2782 Chronic pulmonary embolism: Secondary | ICD-10-CM

## 2017-05-13 DIAGNOSIS — Z23 Encounter for immunization: Secondary | ICD-10-CM

## 2017-05-13 DIAGNOSIS — C9591 Leukemia, unspecified, in remission: Secondary | ICD-10-CM | POA: Insufficient documentation

## 2017-05-13 DIAGNOSIS — Z86711 Personal history of pulmonary embolism: Secondary | ICD-10-CM | POA: Insufficient documentation

## 2017-05-13 DIAGNOSIS — Z86718 Personal history of other venous thrombosis and embolism: Secondary | ICD-10-CM | POA: Insufficient documentation

## 2017-05-13 DIAGNOSIS — M25562 Pain in left knee: Secondary | ICD-10-CM

## 2017-05-13 DIAGNOSIS — Z1231 Encounter for screening mammogram for malignant neoplasm of breast: Secondary | ICD-10-CM

## 2017-05-13 DIAGNOSIS — Z79899 Other long term (current) drug therapy: Secondary | ICD-10-CM | POA: Insufficient documentation

## 2017-05-13 LAB — POCT GLYCOSYLATED HEMOGLOBIN (HGB A1C): HEMOGLOBIN A1C: 5.7

## 2017-05-13 MED ORDER — ACETAMINOPHEN-CODEINE #3 300-30 MG PO TABS: 1.0000 | ORAL_TABLET | ORAL | 0 refills | Status: DC | PRN

## 2017-05-13 MED ORDER — HYDROXYZINE HCL 25 MG PO TABS: 25.0000 mg | ORAL_TABLET | Freq: Three times a day (TID) | ORAL | 3 refills | Status: DC | PRN

## 2017-05-13 MED ORDER — GABAPENTIN 300 MG PO CAPS: 300.0000 mg | ORAL_CAPSULE | Freq: Three times a day (TID) | ORAL | 3 refills | Status: DC

## 2017-05-13 MED ORDER — ATORVASTATIN CALCIUM 40 MG PO TABS: 40.0000 mg | ORAL_TABLET | Freq: Every day | ORAL | 3 refills | Status: DC

## 2017-05-13 MED ORDER — XARELTO 20 MG PO TABS: 20.0000 mg | ORAL_TABLET | Freq: Every day | ORAL | 3 refills | Status: DC

## 2017-05-13 MED ORDER — NORTRIPTYLINE HCL 25 MG PO CAPS: 25.0000 mg | ORAL_CAPSULE | Freq: Every day | ORAL | 3 refills | Status: DC

## 2017-05-13 NOTE — Progress Notes (Signed)
Tammy [name], is a 61 y.o. female  [id number]  GPQ:[id number]  DOB - [birth year]  Chief Complaint  Patient presents with  . Hypertension      Subjective:   Tammy [name] is a 61 y.o. female with history of HTN, DVT and pulmonary embolism on Xarelto here today for a routine follow up visit and medication refills. Patient claims she's doing much better. She complained of an ongoing pain in her neck with associated numbness and tingling in her arm and fingers, worse on the right. 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 She feels better with physical therapy. She does not desire to have any surgery done. She has history of Leukemia in her 35s, has been in remission since after treatment. Patient is doing better with her anxiety, she has developed several coping mechanisms including sitting at the back of vehicle to avoid seeing moving ones that gives her phobia. She also claims hydroxyzine helps a lot. She claims compliant with her medications, reports no side effect. She denies any suicidal ideation or thoughts. She is due for pap smear today. Also due for mammogram and colonoscopy. No hx of fall. Patient has No headache, No chest pain, No abdominal pain - No Nausea, No Cough - SOB.  Problem  Colon Cancer Screening  Pap Smear for Cervical Cancer Screening    ALLERGIES: No Known Allergies  PAST MEDICAL HISTORY: Past Medical History:  Diagnosis Date  . Chronic headaches   . DVT (deep venous thrombosis) ([place])   . Leukemia ([place])    Told she had it at age 81, given some shots  . Pulmonary embolism ([place])   . Seasonal allergies     MEDICATIONS AT HOME: Prior to Admission medications   Medication Sig Start Date End Date Taking? Authorizing Provider  acetaminophen-codeine (TYLENOL #3) 300-30 MG tablet Take 1 tablet by mouth every 4 (four) hours as needed for moderate pain. 05/13/17  Yes Tresa Garter, MD   atorvastatin (LIPITOR) 40 MG tablet Take 1 tablet (40 mg total) by mouth daily. 05/13/17  Yes Tresa Garter, MD  gabapentin (NEURONTIN) 300 MG capsule Take 1 capsule (300 mg total) by mouth 3 (three) times daily. 05/13/17  Yes Tresa Garter, MD  HYDROcodone-acetaminophen (NORCO/VICODIN) 5-325 MG tablet Take 1 tablet by mouth every 6 (six) hours as needed. 08/20/16  Yes Carlota Raspberry, Tiffany, PA-C  hydrOXYzine (ATARAX/VISTARIL) 25 MG tablet Take 1 tablet (25 mg total) by mouth 3 (three) times daily as needed for anxiety. 05/13/17  Yes Tresa Garter, MD  nortriptyline (PAMELOR) 25 MG capsule Take 1 capsule (25 mg total) by mouth at bedtime. 05/13/17  Yes Deshea Pooley E, MD  XARELTO 20 MG TABS tablet Take 1 tablet (20 mg total) by mouth daily. 05/13/17  Yes Tresa Garter, MD    Objective:   Vitals:   05/13/17 0849  BP: 121/75  Pulse: (!) 103  Resp: 18  Temp: 97.6 F (36.4 C)  TempSrc: Oral  SpO2: 98%  Weight: 191 lb (86.6 kg)  Height: '5\' 2"'$  (1.575 m)   Exam General appearance : Awake, alert, not in any distress. Speech Clear. Not toxic looking, obese HEENT: Atraumatic and Normocephalic, pupils equally reactive to light and accomodation Neck: Supple, no JVD. No cervical lymphadenopathy.  Chest: Good air entry bilaterally, no added sounds  CVS: S1 S2 regular, no murmurs.  Abdomen: Bowel sounds present, Non tender and  not distended with no gaurding, rigidity or rebound. Extremities: B/L Lower Ext shows no edema, both legs are warm to touch Neurology: Awake alert, and oriented X 3, CN II-XII intact, Non focal Skin: No Rash  Pelvic Exam: Cervix normal in appearance, external genitalia normal, no adnexal masses or tenderness, no cervical motion tenderness, rectovaginal septum normal, uterus normal size, shape, and consistency and vagina normal without discharge   Data Review Lab Results  Component Value Date   HGBA1C 5.70 10/22/2015   HGBA1C 5.90 03/08/2015    HGBA1C  07/04/2007    5.7 (NOTE)   The ADA recommends the following therapeutic goals for glycemic   control related to Hgb A1C measurement:   Goal of Therapy:   < 7.0% Hgb A1C   Action Suggested:  > 8.0% Hgb A1C   Ref:  Diabetes Care, 22, Suppl. 1, 1999    Assessment & Plan   1. Dyslipidemia  - atorvastatin (LIPITOR) 40 MG tablet; Take 1 tablet (40 mg total) by mouth daily.  Dispense: 90 tablet; Refill: 3 - Lipid panel  2. Numbness and tingling in left upper extremity  Increase - gabapentin (NEURONTIN) 300 MG capsule; Take 1 capsule (300 mg total) by mouth 3 (three) times daily.  Dispense: 90 capsule; Refill: 3 - nortriptyline (PAMELOR) 25 MG capsule; Take 1 capsule (25 mg total) by mouth at bedtime.  Dispense: 30 capsule; Refill: 3  - CBC with Differential/Platelet - CMP14+EGFR  3. Generalized anxiety disorder  - hydrOXYzine (ATARAX/VISTARIL) 25 MG tablet; Take 1 tablet (25 mg total) by mouth 3 (three) times daily as needed for anxiety.  Dispense: 30 tablet; Refill: 3 - TSH  4. Encounter for screening mammogram for breast cancer  - MM Digital Screening; Future  5. Pap smear for cervical cancer screening  - Cytology - PAP - Cervicovaginal ancillary only - Urinalysis, Complete - Hepatitis C Ab Reflex HCV RNA, QUANT  6. Colon cancer screening  - Ambulatory referral to Gastroenterology  7. Arthralgia of left lower leg  - acetaminophen-codeine (TYLENOL #3) 300-30 MG tablet; Take 1 tablet by mouth every 4 (four) hours as needed for moderate pain.  Dispense: 60 tablet; Refill: 0  8. Other chronic pulmonary embolism without acute cor pulmonale (HCC)  - XARELTO 20 MG TABS tablet; Take 1 tablet (20 mg total) by mouth daily.  Dispense: 30 tablet; Refill: 3  Patient have been counseled extensively about nutrition and exercise. Other issues discussed during this visit include: low cholesterol diet, weight control and daily exercise, importance of adherence with medications and  regular follow-up.   Return in about 3 months (around 08/13/2017) for Follow up Pain and comorbidities, Generalized Anxiety Disorder.  The patient was given clear instructions to go to ER or return to medical center if symptoms don't improve, worsen or new problems develop. The patient verbalized understanding. The patient was told to call to get lab results if they haven't heard anything in the next week.   This note has been created with Surveyor, quantity. Any transcriptional errors are unintentional.    Angelica Chessman, MD, [place], Karilyn Cota, [place] [facility] [place], [place]   05/13/2017, 9:37 AM

## 2017-05-13 NOTE — Patient Instructions (Signed)
Generalized Anxiety Disorder, Adult Generalized anxiety disorder (GAD) is a mental health disorder. People with this condition constantly worry about everyday events. Unlike normal anxiety, worry related to GAD is not triggered by a specific event. These worries also do not fade or get better with time. GAD interferes with life functions, including relationships, work, and school. GAD can vary from mild to severe. People with severe GAD can have intense waves of anxiety with physical symptoms (panic attacks). What are the causes? The exact cause of GAD is not known. What increases the risk? This condition is more likely to develop in:  Women.  People who have a family history of anxiety disorders.  People who are very shy.  People who experience very stressful life events, such as the death of a loved one.  People who have a very stressful family environment.  What are the signs or symptoms? People with GAD often worry excessively about many things in their lives, such as their health and family. They may also be overly concerned about:  Doing well at work.  Being on time.  Natural disasters.  Friendships.  Physical symptoms of GAD include:  Fatigue.  Muscle tension or having muscle twitches.  Trembling or feeling shaky.  Being easily startled.  Feeling like your heart is pounding or racing.  Feeling out of breath or like you cannot take a deep breath.  Having trouble falling asleep or staying asleep.  Sweating.  Nausea, diarrhea, or irritable bowel syndrome (IBS).  Headaches.  Trouble concentrating or remembering facts.  Restlessness.  Irritability.  How is this diagnosed? Your health care provider can diagnose GAD based on your symptoms and medical history. You will also have a physical exam. The health care provider will ask specific questions about your symptoms, including how severe they are, when they started, and if they come and go. Your health care  provider may ask you about your use of alcohol or drugs, including prescription medicines. Your health care provider may refer you to a mental health specialist for further evaluation. Your health care provider will do a thorough examination and may perform additional tests to rule out other possible causes of your symptoms. To be diagnosed with GAD, a person must have anxiety that:  Is out of his or her control.  Affects several different aspects of his or her life, such as work and relationships.  Causes distress that makes him or her unable to take part in normal activities.  Includes at least three physical symptoms of GAD, such as restlessness, fatigue, trouble concentrating, irritability, muscle tension, or sleep problems.  Before your health care provider can confirm a diagnosis of GAD, these symptoms must be present more days than they are not, and they must last for six months or longer. How is this treated? The following therapies are usually used to treat GAD:  Medicine. Antidepressant medicine is usually prescribed for long-term daily control. Antianxiety medicines may be added in severe cases, especially when panic attacks occur.  Talk therapy (psychotherapy). Certain types of talk therapy can be helpful in treating GAD by providing support, education, and guidance. Options include: ? Cognitive behavioral therapy (CBT). People learn coping skills and techniques to ease their anxiety. They learn to identify unrealistic or negative thoughts and behaviors and to replace them with positive ones. ? Acceptance and commitment therapy (ACT). This treatment teaches people how to be mindful as a way to cope with unwanted thoughts and feelings. ? Biofeedback. This process trains you to   manage your body's response (physiological response) through breathing techniques and relaxation methods. You will work with a therapist while machines are used to monitor your physical symptoms.  Stress  management techniques. These include yoga, meditation, and exercise.  A mental health specialist can help determine which treatment is best for you. Some people see improvement with one type of therapy. However, other people require a combination of therapies. Follow these instructions at home:  Take over-the-counter and prescription medicines only as told by your health care provider.  Try to maintain a normal routine.  Try to anticipate stressful situations and allow extra time to manage them.  Practice any stress management or self-calming techniques as taught by your health care provider.  Do not punish yourself for setbacks or for not making progress.  Try to recognize your accomplishments, even if they are small.  Keep all follow-up visits as told by your health care provider. This is important. Contact a health care provider if:  Your symptoms do not get better.  Your symptoms get worse.  You have signs of depression, such as: ? A persistently sad, cranky, or irritable mood. ? Loss of enjoyment in activities that used to bring you joy. ? Change in weight or eating. ? Changes in sleeping habits. ? Avoiding friends or family members. ? Loss of energy for normal tasks. ? Feelings of guilt or worthlessness. Get help right away if:  You have serious thoughts about hurting yourself or others. If you ever feel like you may hurt yourself or others, or have thoughts about taking your own life, get help right away. You can go to your nearest emergency department or call:  Your local emergency services (911 in the [place]  A suicide crisis helpline, such as the [facility] at 773-559-6150. This is open 24 hours a day.  Summary  Generalized anxiety disorder (GAD) is a mental health disorder that involves worry that is not triggered by a specific event.  People with GAD often worry excessively about many things in their lives, such as their health and  family.  GAD may cause physical symptoms such as restlessness, trouble concentrating, sleep problems, frequent sweating, nausea, diarrhea, headaches, and trembling or muscle twitching.  A mental health specialist can help determine which treatment is best for you. Some people see improvement with one type of therapy. However, other people require a combination of therapies. This information is not intended to replace advice given to you by your health care provider. Make sure you discuss any questions you have with your health care provider. Document Released: 02/14/2013 Document Revised: 09/09/2016 Document Reviewed: 09/09/2016 Elsevier Interactive Patient Education  2018 [employer]. Paresthesia Paresthesia is a burning or prickling feeling. This feeling can happen in any part of the body. It often happens in the hands, arms, legs, or feet. Usually, it is not painful. In most cases, the feeling goes away in a short time and is not a sign of a serious problem. Follow these instructions at home:  Avoid drinking alcohol.  Try massage or needle therapy (acupuncture) to help with your problems.  Keep all follow-up visits as told by your doctor. This is important. Contact a doctor if:  You keep on having episodes of paresthesia.  Your burning or prickling feeling gets worse when you walk.  You have pain or cramps.  You feel dizzy.  You have a rash. Get help right away if:  You feel weak.  You have trouble walking or moving.  You have problems speaking, understanding, or seeing.  You feel confused.  You cannot control when you pee (urinate) or poop (bowel movement).  You lose feeling (numbness) after an injury.  You pass out (faint). This information is not intended to replace advice given to you by your health care provider. Make sure you discuss any questions you have with your health care provider. Document Released: 10/02/2008 Document Revised: 03/27/2016 Document  Reviewed: 10/16/2014 Elsevier Interactive Patient Education  2018 [employer]. Dyslipidemia Dyslipidemia is an imbalance of waxy, fat-like substances (lipids) in the blood. The body needs lipids in small amounts. Dyslipidemia often involves a high level of cholesterol or triglycerides, which are types of lipids. Common forms of dyslipidemia include:  High levels of bad cholesterol (LDL cholesterol). LDL is the type of cholesterol that causes fatty deposits (plaques) to build up in the blood vessels that carry blood away from your heart (arteries).  Low levels of good cholesterol (HDL cholesterol). HDL cholesterol is the type of cholesterol that protects against heart disease. High levels of HDL remove the LDL buildup from arteries.  High levels of triglycerides. Triglycerides are a fatty substance in the blood that is linked to a buildup of plaques in the arteries.  You can develop dyslipidemia because of the genes you are born with (primary dyslipidemia) or changes that occur during your life (secondary dyslipidemia), or as a side effect of certain medical treatments. What are the causes? Primary dyslipidemia is caused by changes (mutations) in genes that are passed down through families (inherited). These mutations cause several types of dyslipidemia. Mutations can result in disorders that make the body produce too much LDL cholesterol or triglycerides, or not enough HDL cholesterol. These disorders may lead to heart disease, arterial disease, or stroke at an early age. Causes of secondary dyslipidemia include certain lifestyle choices and diseases that lead to dyslipidemia, such as:  Eating a diet that is high in animal fat.  Not getting enough activity or exercise (having a sedentary lifestyle).  Having diabetes, kidney disease, liver disease, or thyroid disease.  Drinking large amounts of alcohol.  Using certain types of drugs.  What increases the risk? You may be at greater risk  for dyslipidemia if you are an older man or if you are a woman who has gone through menopause. Other risk factors include:  Having a family history of dyslipidemia.  Taking certain medicines, including birth control pills, steroids, some diuretics, beta-blockers, and some medicines forHIV.  Smoking cigarettes.  Eating a high-fat diet.  Drinking large amounts of alcohol.  Having certain medical conditions such as diabetes, polycystic ovary syndrome (PCOS), pregnancy, kidney disease, liver disease, or hypothyroidism.  Not exercising regularly.  Being overweight or obese with too much belly fat.  What are the signs or symptoms? Dyslipidemia does not usually cause any symptoms. Very high lipid levels can cause fatty bumps under the skin (xanthomas) or a white or gray ring around the black center (pupil) of the eye. Very high triglyceride levels can cause inflammation of the pancreas (pancreatitis). How is this diagnosed? Your health care provider may diagnose dyslipidemia based on a routine blood test (fasting blood test). Because most people do not have symptoms of the condition, this blood testing (lipid profile) is done on adults age 40 and older and is repeated every 5 years. This test checks:  Total cholesterol. This is a measure of the total amount of cholesterol in your blood, including LDL cholesterol, HDL cholesterol, and triglycerides. A healthy number  is below 200.  LDL cholesterol. The target number for LDL cholesterol is different for each person, depending on individual risk factors. For most people, a number below 100 is healthy. Ask your health care provider what your LDL cholesterol number should be.  HDL cholesterol. An HDL level of 60 or higher is best because it helps to protect against heart disease. A number below 73 for men or below 60 for women increases the risk for heart disease.  Triglycerides. A healthy triglyceride number is below 150.  If your lipid profile  is abnormal, your health care provider may do other blood tests to get more information about your condition. How is this treated? Treatment depends on the type of dyslipidemia that you have and your other risk factors for heart disease and stroke. Your health care provider will have a target range for your lipid levels based on this information. For many people, treatment starts with lifestyle changes, such as diet and exercise. Your health care provider may recommend that you:  Get regular exercise.  Make changes to your diet.  Quit smoking if you smoke.  If diet changes and exercise do not help you reach your goals, your health care provider may also prescribe medicine to lower lipids. The most commonly prescribed type of medicine lowers your LDL cholesterol (statin drug). If you have a high triglyceride level, your provider may prescribe another type of drug (fibrate) or an omega-3 fish oil supplement, or both. Follow these instructions at home:  Take over-the-counter and prescription medicines only as told by your health care provider. This includes supplements.  Get regular exercise. Start an aerobic exercise and strength training program as told by your health care provider. Ask your health care provider what activities are safe for you. Your health care provider may recommend: ? 30 minutes of aerobic activity 4-6 days a week. Brisk walking is an example of aerobic activity. ? Strength training 2 days a week.  Eat a healthy diet as told by your health care provider. This can help you reach and maintain a healthy weight, lower your LDL cholesterol, and raise your HDL cholesterol. It may help to work with a diet and nutrition specialist (dietitian) to make a plan that is right for you. Your dietitian or health care provider may recommend: ? Limiting your calories, if you are overweight. ? Eating more fruits, vegetables, whole grains, fish, and lean meats. ? Limiting saturated fat, trans  fat, and cholesterol.  Follow instructions from your health care provider or dietitian about eating or drinking restrictions.  Limit alcohol intake to no more than one drink per day for nonpregnant women and two drinks per day for men. One drink equals 12 oz of beer, 5 oz of wine, or 1 oz of hard liquor.  Do not use any products that contain nicotine or tobacco, such as cigarettes and e-cigarettes. If you need help quitting, ask your health care provider.  Keep all follow-up visits as told by your health care provider. This is important. Contact a health care provider if:  You are having trouble sticking to your exercise or diet plan.  You are struggling to quit smoking or control your use of alcohol. Summary  Dyslipidemia is an imbalance of waxy, fat-like substances (lipids) in the blood. The body needs lipids in small amounts. Dyslipidemia often involves a high level of cholesterol or triglycerides, which are types of lipids.  Treatment depends on the type of dyslipidemia that you have and your other risk  factors for heart disease and stroke.  For many people, treatment starts with lifestyle changes, such as diet and exercise. Your health care provider may also prescribe medicine to lower lipids. This information is not intended to replace advice given to you by your health care provider. Make sure you discuss any questions you have with your health care provider. Document Released: 10/25/2013 Document Revised: 06/16/2016 Document Reviewed: 06/16/2016 Elsevier Interactive Patient Education  [employer].

## 2017-05-13 NOTE — Progress Notes (Signed)
Patient says that her shoulder was hurting and that it went to the elbow.  Patient also says she needs a refill on Tylenol 3 and hydroxyzien   Patient tolerated vaccine well today.

## 2017-05-14 ENCOUNTER — Other Ambulatory Visit: Payer: Self-pay | Admitting: *Deleted

## 2017-05-14 DIAGNOSIS — R200 Anesthesia of skin: Secondary | ICD-10-CM

## 2017-05-14 DIAGNOSIS — E785 Hyperlipidemia, unspecified: Secondary | ICD-10-CM

## 2017-05-14 DIAGNOSIS — F411 Generalized anxiety disorder: Secondary | ICD-10-CM

## 2017-05-14 DIAGNOSIS — R202 Paresthesia of skin: Secondary | ICD-10-CM

## 2017-05-14 DIAGNOSIS — I2782 Chronic pulmonary embolism: Secondary | ICD-10-CM

## 2017-05-14 LAB — LIPID PANEL
CHOLESTEROL TOTAL: 221 mg/dL — AB (ref 100–199)
Chol/HDL Ratio: 5.5 ratio — ABNORMAL HIGH (ref 0.0–4.4)
HDL: 40 mg/dL (ref >39)
LDL CALC: 116 mg/dL — AB (ref 0–99)
TRIGLYCERIDES: 326 mg/dL — AB (ref 0–149)
VLDL Cholesterol Cal: 65 mg/dL — ABNORMAL HIGH (ref 5–40)

## 2017-05-14 LAB — CMP14+EGFR
ALK PHOS: 112 IU/L (ref 39–117)
ALT: 20 IU/L (ref 0–32)
AST: 18 IU/L (ref 0–40)
Albumin/Globulin Ratio: 1.4 (ref 1.2–2.2)
Albumin: 4.2 g/dL (ref 3.5–5.5)
BUN/Creatinine Ratio: 11 (ref 9–23)
BUN: 11 mg/dL (ref 6–24)
Bilirubin Total: 0.6 mg/dL (ref 0.0–1.2)
CALCIUM: 9.9 mg/dL (ref 8.7–10.2)
CO2: 24 mmol/L (ref 20–29)
CREATININE: 1.04 mg/dL — AB (ref 0.57–1.00)
Chloride: 101 mmol/L (ref 96–106)
GFR calc Af Amer: 68 mL/min/{1.73_m2} (ref >59)
GFR, EST NON AFRICAN AMERICAN: 59 mL/min/{1.73_m2} — AB (ref >59)
GLUCOSE: 113 mg/dL — AB (ref 65–99)
Globulin, Total: 3.1 g/dL (ref 1.5–4.5)
Potassium: 4.7 mmol/L (ref 3.5–5.2)
SODIUM: 140 mmol/L (ref 134–144)
Total Protein: 7.3 g/dL (ref 6.0–8.5)

## 2017-05-14 LAB — CBC WITH DIFFERENTIAL/PLATELET
BASOS ABS: 0 10*3/uL (ref 0.0–0.2)
Basos: 0 %
EOS (ABSOLUTE): 0.2 10*3/uL (ref 0.0–0.4)
Eos: 2 %
Hematocrit: 39.9 % (ref 34.0–46.6)
Hemoglobin: 13.3 g/dL (ref 11.1–15.9)
IMMATURE GRANULOCYTES: 0 %
Immature Grans (Abs): 0 10*3/uL (ref 0.0–0.1)
LYMPHS ABS: 2.4 10*3/uL (ref 0.7–3.1)
Lymphs: 29 %
MCH: 30.1 pg (ref 26.6–33.0)
MCHC: 33.3 g/dL (ref 31.5–35.7)
MCV: 90 fL (ref 79–97)
MONOS ABS: 0.7 10*3/uL (ref 0.1–0.9)
Monocytes: 8 %
NEUTROS PCT: 61 %
Neutrophils Absolute: 5.1 10*3/uL (ref 1.4–7.0)
PLATELETS: 346 10*3/uL (ref 150–379)
RBC: 4.42 x10E6/uL (ref 3.77–5.28)
RDW: 13.8 % (ref 12.3–15.4)
WBC: 8.3 10*3/uL (ref 3.4–10.8)

## 2017-05-14 LAB — TSH: TSH: 0.545 u[IU]/mL (ref 0.450–4.500)

## 2017-05-14 MED ORDER — NORTRIPTYLINE HCL 25 MG PO CAPS
25.0000 mg | ORAL_CAPSULE | Freq: Every day | ORAL | 3 refills | Status: DC
Start: 2017-05-14 — End: 2017-11-18

## 2017-05-14 MED ORDER — HYDROXYZINE HCL 25 MG PO TABS: 25.0000 mg | ORAL_TABLET | Freq: Three times a day (TID) | ORAL | 3 refills | Status: DC | PRN

## 2017-05-14 MED ORDER — XARELTO 20 MG PO TABS: 20.0000 mg | ORAL_TABLET | Freq: Every day | ORAL | 3 refills | Status: DC

## 2017-05-14 MED ORDER — ATORVASTATIN CALCIUM 40 MG PO TABS: 40.0000 mg | ORAL_TABLET | Freq: Every day | ORAL | 3 refills | Status: DC

## 2017-05-14 MED ORDER — GABAPENTIN 300 MG PO CAPS: 300.0000 mg | ORAL_CAPSULE | Freq: Three times a day (TID) | ORAL | 3 refills | Status: DC

## 2017-05-14 NOTE — Telephone Encounter (Signed)
Patients medication reordered to [facility] outpatient pharmacy.

## 2017-05-15 LAB — SPECIMEN STATUS REPORT

## 2017-05-15 LAB — HEPATITIS C ANTIBODY: Hep C Virus Ab: <0.1 s/co ratio (ref 0.0–0.9)

## 2017-05-18 ENCOUNTER — Telehealth: Payer: Self-pay | Admitting: *Deleted

## 2017-05-18 NOTE — Telephone Encounter (Signed)
-----   Message from Tresa Garter, MD sent at 05/15/2017  4:40 PM EDT ----- Please inform patient that her lab results are mostly normal except for slightly high cholesterol. Please continue to take your cholesterol medication and please limit saturated fat to no more than 7% of your calories, limit cholesterol to 200 mg/day, increase fiber and exercise as tolerated. If needed we may add another cholesterol lowering medication to your regimen. Hepatitis C was negative on 05/13/2017

## 2017-05-18 NOTE — Telephone Encounter (Signed)
Patient verified DOB Patient is aware of labs being normal except for cholesterol being slightly high. Patient advised to adhere to current atorvastatin regimen and decrease intake of saturated fat and cholesterol. Patient advised to increase fiber and exercise.  Patient is also aware of hep c being negative. No further questions at this time.

## 2017-05-19 LAB — CYTOLOGY - PAP
Diagnosis: NEGATIVE
HPV (WINDOPATH): NOT DETECTED

## 2017-05-20 ENCOUNTER — Ambulatory Visit: Payer: Self-pay

## 2017-05-27 ENCOUNTER — Ambulatory Visit: Payer: Self-pay | Attending: Internal Medicine

## 2017-05-28 ENCOUNTER — Telehealth: Payer: Self-pay | Admitting: *Deleted

## 2017-05-28 NOTE — Telephone Encounter (Signed)
Medical Assistant left message on patient's home and cell voicemail. Voicemail states to give a call back to [place] with [facility] at [phone]. !!!Please inform patient of pap smear being negative for malignancy!!!

## 2017-05-28 NOTE — Telephone Encounter (Signed)
-----   Message from Tresa Garter, MD sent at 05/19/2017  9:58 AM EDT ----- Pap Smear is negative for cancer.

## 2017-06-05 MED FILL — XARELTO 20 MG TABLET: 20 | 30 days supply | Qty: 30 | Fill #6

## 2017-06-05 MED FILL — ACETAMINOPHEN/COD #3 TABLET: 300-30 | 10 days supply | Qty: 60 | Fill #0

## 2017-06-05 MED FILL — hydrOXYzine HCL 25 MG TABS: 25 | 10 days supply | Qty: 30 | Fill #0

## 2017-06-05 MED FILL — GABAPENTIN 300 MG CAPSULE: 300 | 30 days supply | Qty: 90 | Fill #0

## 2017-06-29 ENCOUNTER — Encounter: Payer: Self-pay | Admitting: Hematology and Oncology

## 2017-06-29 ENCOUNTER — Ambulatory Visit (HOSPITAL_BASED_OUTPATIENT_CLINIC_OR_DEPARTMENT_OTHER): Payer: Self-pay | Admitting: Hematology and Oncology

## 2017-06-29 ENCOUNTER — Telehealth: Payer: Self-pay | Admitting: Hematology and Oncology

## 2017-06-29 DIAGNOSIS — I825Y9 Chronic embolism and thrombosis of unspecified deep veins of unspecified proximal lower extremity: Secondary | ICD-10-CM

## 2017-06-29 DIAGNOSIS — Z7901 Long term (current) use of anticoagulants: Secondary | ICD-10-CM

## 2017-06-29 DIAGNOSIS — G629 Polyneuropathy, unspecified: Secondary | ICD-10-CM | POA: Insufficient documentation

## 2017-06-29 DIAGNOSIS — Z86718 Personal history of other venous thrombosis and embolism: Secondary | ICD-10-CM

## 2017-06-29 NOTE — Progress Notes (Signed)
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 According to the patient, she was first diagnosed with blood clot in 2008. According to the patient it was unprovoked. The patient complained of shortness of breath and cough and chest discomfort. She was smoking at that time. She had imaging study of the lung done which show evidence of PE. According to the patient she was on a blood thinner for 2 years. The date of the CT scan was 06/24/2007. In 2014, she started to complain of intermittent sharp discomfort on the right leg associated with some mild swelling. She states that she had pain on the left leg as well but is not as severe compared to the right leg. She went to the emergency department. Results of the ultrasound venous Doppler came back consistent with acute deep vein thrombosis involving the posterior tibial and perioneal vein of the right lower extremity. D-dimer was elevated. She was placed on Lovenox and transition to warfarin 5 mg daily. Her INR has been supratherapeutic requiring vitamin K and multiple dose interruption to keep her INR at the desire range at 2-3. She denies any bleeding complications such as spontaneous epistaxis, hematuria, hematochezia, or the need for blood transfusions. She had mild intermittent chest discomfort throughout this and had imaging study done which show possible new clot in the left lung on the CT scan from September 2014. She has been pregnant 4 times including C-section for her pregnancy and never developed any peripartum blood clots. There were no family history of blood clots. In October 2014, I made a decision to change her to Xarelto. INTERVAL HISTORY: Tammy [name] 61 y.o. female returns for further follow-up. She has diffuse chronic pain She also has generalized anxiety and depression She is on multiple different medications She also take  gabapentin for neuropathic pain of unknown etiology She is doing well on Xarelto. The patient denies any recent signs or symptoms of bleeding such as spontaneous epistaxis, hematuria or hematochezia. She is having difficulties paying for Xarelto and requested me to send a letter to help with payment assistance.  I have reviewed the past medical history, past surgical history, social history and family history with the patient and they are unchanged from previous note.  ALLERGIES:  has No Known Allergies.  MEDICATIONS:  Current Outpatient Prescriptions  Medication Sig Dispense Refill  . acetaminophen-codeine (TYLENOL #3) 300-30 MG tablet Take 1 tablet by mouth every 4 (four) hours as needed for moderate pain. 60 tablet 0  . atorvastatin (LIPITOR) 40 MG tablet Take 1 tablet (40 mg total) by mouth daily. 90 tablet 3  . gabapentin (NEURONTIN) 300 MG capsule Take 1 capsule (300 mg total) by mouth 3 (three) times daily. 90 capsule 3  . HYDROcodone-acetaminophen (NORCO/VICODIN) 5-325 MG tablet Take 1 tablet by mouth every 6 (six) hours as needed. 12 tablet 0  . hydrOXYzine (ATARAX/VISTARIL) 25 MG tablet Take 1 tablet (25 mg total) by mouth 3 (three) times daily as needed for anxiety. 30 tablet 3  . nortriptyline (PAMELOR) 25 MG capsule Take 1 capsule (25 mg total) by mouth at bedtime. 30 capsule 3  . XARELTO 20 MG TABS tablet Take 1 tablet (20 mg total) by mouth daily. 30 tablet 3   No current facility-administered medications for this visit.      REVIEW OF SYSTEMS:   Constitutional: Denies fevers, chills or night sweats Eyes: Denies blurriness of vision  Ears, nose, mouth, throat, and face: Denies mucositis or sore throat Respiratory: Denies cough, dyspnea or wheezes Cardiovascular: Denies palpitation, chest discomfort or lower extremity swelling Gastrointestinal:  Denies nausea, heartburn or change in bowel habits Skin: Denies abnormal skin rashes Lymphatics: Denies new lymphadenopathy or  easy bruising Neurological:Denies numbness, tingling or new weaknesses Behavioral/Psych: Mood is stable, no new changes  All other systems were reviewed with the patient and are negative.  PHYSICAL EXAMINATION: ECOG PERFORMANCE STATUS: 1 - Symptomatic but completely ambulatory  Vitals:   06/29/17 1503  BP: 126/82  Pulse: (!) 106  Resp: 18  Temp: 98.2 F (36.8 C)  SpO2: 98%   Filed Weights   06/29/17 1503  Weight: 190 lb 1.6 oz (86.2 kg)    GENERAL:alert, no distress and comfortable SKIN: skin color, texture, turgor are normal, no rashes or significant lesions EYES: normal, Conjunctiva are pink and non-injected, sclera clear OROPHARYNX:no exudate, no erythema and lips, buccal mucosa, and tongue normal  NECK: supple, thyroid normal size, non-tender, without nodularity LYMPH:  no palpable lymphadenopathy in the cervical, axillary or inguinal LUNGS: clear to auscultation and percussion with normal breathing effort HEART: regular rate & rhythm and no murmurs and no lower extremity edema ABDOMEN:abdomen soft, non-tender and normal bowel sounds Musculoskeletal:no cyanosis of digits and no clubbing  NEURO: alert & oriented x 3 with fluent speech, no focal motor/sensory deficits  LABORATORY DATA:  I have reviewed the data as listed     Component Value Date/Time   NA 140 05/13/2017 0956   NA 141 08/11/2013 1046   K 4.7 05/13/2017 0956   K 4.3 08/11/2013 1046   CL 101 05/13/2017 0956   CO2 24 05/13/2017 0956   CO2 27 08/11/2013 1046   GLUCOSE 113 (H) 05/13/2017 0956   GLUCOSE 106 (H) 08/19/2016 2207   GLUCOSE 102 08/11/2013 1046   BUN 11 05/13/2017 0956   BUN 10.5 08/11/2013 1046   CREATININE 1.04 (H) 05/13/2017 0956   CREATININE 0.99 10/22/2015 1159   CREATININE 1.0 08/11/2013 1046   CALCIUM 9.9 05/13/2017 0956   CALCIUM 9.5 08/11/2013 1046   PROT 7.3 05/13/2017 0956   PROT 7.3 08/11/2013 1046   ALBUMIN 4.2 05/13/2017 0956   ALBUMIN 3.4 (L) 08/11/2013 1046   AST 18  05/13/2017 0956   AST 15 08/11/2013 1046   ALT 20 05/13/2017 0956   ALT 13 08/11/2013 1046   ALKPHOS 112 05/13/2017 0956   ALKPHOS 112 08/11/2013 1046   BILITOT 0.6 05/13/2017 0956   BILITOT 0.73 08/11/2013 1046   GFRNONAA 59 (L) 05/13/2017 0956   GFRNONAA 63 10/22/2015 1159   GFRAA 68 05/13/2017 0956   GFRAA 73 10/22/2015 1159    No results found for: SPEP, UPEP  Lab Results  Component Value Date   WBC 8.3 05/13/2017   NEUTROABS 5.1 05/13/2017   HGB 13.3 05/13/2017   HCT 39.9 05/13/2017   MCV 90 05/13/2017   PLT 346 05/13/2017      Chemistry      Component Value Date/Time   NA 140 05/13/2017 0956   NA 141 08/11/2013 1046   K 4.7 05/13/2017 0956   K 4.3 08/11/2013 1046   CL 101 05/13/2017 0956   CO2 24 05/13/2017 0956   CO2 27 08/11/2013 1046   BUN 11 05/13/2017 0956   BUN 10.5 08/11/2013 1046   CREATININE 1.04 (H) 05/13/2017 0956   CREATININE 0.99 10/22/2015 1159   CREATININE 1.0 08/11/2013 1046      Component Value  Date/Time   CALCIUM 9.9 05/13/2017 0956   CALCIUM 9.5 08/11/2013 1046   ALKPHOS 112 05/13/2017 0956   ALKPHOS 112 08/11/2013 1046   AST 18 05/13/2017 0956   AST 15 08/11/2013 1046   ALT 20 05/13/2017 0956   ALT 13 08/11/2013 1046   BILITOT 0.6 05/13/2017 0956   BILITOT 0.73 08/11/2013 1046      ASSESSMENT & PLAN:  DVT (deep venous thrombosis) She is doing with very well since her treatment was switched to Xarelto. She has no complications from treatment. Goal of treatment is lifelong. I help her to fill out a patient assistant program with the company to get help to pay for Xarelto In the future, I plan to reduce a dose of Xarelto to 10 mg for extended duration treatment I will see her once a year  Neuropathy She complained of severe neuropathic pain of unknown etiology I will refer her to see neurologist for further evaluation Her primary doctor has prescribed gabapentin   Orders Placed This Encounter  Procedures  . Ambulatory  referral to Neurology    Referral Priority:   Routine    Referral Type:   Consultation    Referral Reason:   Specialty Services Required    Referred to Provider:   Alda Berthold, DO    Requested Specialty:   Neurology    Number of Visits Requested:   1    All questions were answered. The patient knows to call the clinic with any problems, questions or concerns. No barriers to learning was detected.  I spent 15 minutes counseling the patient face to face. The total time spent in the appointment was 20 minutes and more than 50% was on counseling.     [name], MD 8/27/20184:03 PM

## 2017-06-29 NOTE — Telephone Encounter (Signed)
Gave patient avs and calendar with upcoming appts.  °

## 2017-06-29 NOTE — Assessment & Plan Note (Signed)
She is doing with very well since her treatment was switched to Xarelto. She has no complications from treatment. Goal of treatment is lifelong. I help her to fill out a patient assistant program with the company to get help to pay for Xarelto In the future, I plan to reduce a dose of Xarelto to 10 mg for extended duration treatment I will see her once a year

## 2017-06-29 NOTE — Assessment & Plan Note (Signed)
She complained of severe neuropathic pain of unknown etiology I will refer her to see neurologist for further evaluation Her primary doctor has prescribed gabapentin

## 2017-06-30 ENCOUNTER — Encounter: Payer: Self-pay | Admitting: Neurology

## 2017-08-19 ENCOUNTER — Ambulatory Visit: Payer: Self-pay | Admitting: Internal Medicine

## 2017-08-24 ENCOUNTER — Encounter: Payer: Self-pay | Admitting: Internal Medicine

## 2017-08-24 ENCOUNTER — Ambulatory Visit: Payer: Self-pay | Attending: Internal Medicine | Admitting: Internal Medicine

## 2017-08-24 VITALS — BP 109/72 | HR 112 | Temp 98.5°F | Resp 18 | Ht 62.0 in | Wt 193.0 lb

## 2017-08-24 DIAGNOSIS — L409 Psoriasis, unspecified: Secondary | ICD-10-CM | POA: Insufficient documentation

## 2017-08-24 DIAGNOSIS — F411 Generalized anxiety disorder: Secondary | ICD-10-CM | POA: Insufficient documentation

## 2017-08-24 DIAGNOSIS — I10 Essential (primary) hypertension: Secondary | ICD-10-CM | POA: Insufficient documentation

## 2017-08-24 DIAGNOSIS — G4733 Obstructive sleep apnea (adult) (pediatric): Secondary | ICD-10-CM | POA: Insufficient documentation

## 2017-08-24 DIAGNOSIS — Z7901 Long term (current) use of anticoagulants: Secondary | ICD-10-CM | POA: Insufficient documentation

## 2017-08-24 DIAGNOSIS — Z86711 Personal history of pulmonary embolism: Secondary | ICD-10-CM | POA: Insufficient documentation

## 2017-08-24 DIAGNOSIS — Z79899 Other long term (current) drug therapy: Secondary | ICD-10-CM | POA: Insufficient documentation

## 2017-08-24 DIAGNOSIS — E785 Hyperlipidemia, unspecified: Secondary | ICD-10-CM | POA: Insufficient documentation

## 2017-08-24 MED ORDER — CLOBETASOL PROPIONATE 0.05 % EX CREA: 1.0000 "application " | TOPICAL_CREAM | Freq: Two times a day (BID) | CUTANEOUS | 3 refills | Status: DC

## 2017-08-24 NOTE — Patient Instructions (Signed)
Deep Vein Thrombosis A deep vein thrombosis (DVT) is a blood clot (thrombus) that usually occurs in a deep, larger vein of the lower leg or the pelvis, or in an upper extremity such as the arm. These are dangerous and can lead to serious and even life-threatening complications if the clot travels to the lungs. A DVT can damage the valves in your leg veins so that instead of flowing upward, the blood pools in the lower leg. This is called post-thrombotic syndrome, and it can result in pain, swelling, discoloration, and sores on the leg. What are the causes? A DVT is caused by the formation of a blood clot in your leg, pelvis, or arm. Usually, several things contribute to the formation of blood clots. A clot may develop when:  Your blood flow slows down.  Your vein becomes damaged in some way.  You have a condition that makes your blood clot more easily.  What increases the risk? A DVT is more likely to develop in:  People who are older, especially over 60 years of age.  People who are overweight (obese).  People who sit or lie still for a long time, such as during long-distance travel (over 4 hours), bed rest, hospitalization, or during recovery from certain medical conditions like a stroke.  People who do not engage in much physical activity (sedentary lifestyle).  People who have chronic breathing disorders.  People who have a personal or family history of blood clots or blood clotting disease.  People who have peripheral vascular disease (PVD), diabetes, or some types of cancer.  People who have heart disease, especially if the person had a recent heart attack or has congestive heart failure.  People who have neurological diseases that affect the legs (leg paresis).  People who have had a traumatic injury, such as breaking a hip or leg.  People who have recently had major or lengthy surgery, especially on the hip, knee, or abdomen.  People who have had a central line placed  inside a large vein.  People who take medicines that contain the hormone estrogen. These include birth control pills and hormone replacement therapy.  Pregnancy or during childbirth or the postpartum period.  Long plane flights (over 8 hours).  What are the signs or symptoms?  Symptoms of a DVT can include:  Swelling of your leg or arm, especially if one side is much worse.  Warmth and redness of your leg or arm, especially if one side is much worse.  Pain in your arm or leg. If the clot is in your leg, symptoms may be more noticeable or worse when you stand or walk.  A feeling of pins and needles, if the clot is in the arm.  The symptoms of a DVT that has traveled to the lungs (pulmonary embolism, PE) usually start suddenly and include:  Shortness of breath while active or at rest.  Coughing or coughing up blood or blood-tinged mucus.  Chest pain that is often worse with deep breaths.  Rapid or irregular heartbeat.  Feeling light-headed or dizzy.  Fainting.  Feeling anxious.  Sweating.  There may also be pain and swelling in a leg if that is where the blood clot started. These symptoms may represent a serious problem that is an emergency. Do not wait to see if the symptoms will go away. Get medical help right away. Call your local emergency services (911 in the [place] Do not drive yourself to the hospital. How is this diagnosed? Your health   care provider will take a medical history and perform a physical exam. You may also have other tests, including:  Blood tests to assess the clotting properties of your blood.  Imaging tests, such as CT, ultrasound, MRI, X-ray, and other tests to see if you have clots anywhere in your body.  How is this treated? After a DVT is identified, it can be treated. The type of treatment that you receive depends on many factors, such as the cause of your DVT, your risk for bleeding or developing more clots, and other medical conditions that  you have. Sometimes, a combination of treatments is necessary. Treatment options may be combined and include:  Monitoring the blood clot with ultrasound.  Taking medicines by mouth, such as newer blood thinners (anticoagulants), thrombolytics, or warfarin.  Taking anticoagulant medicine by injection or through an IV tube.  Wearing compression stockings or using different types ofdevices.  Surgery (rare) to remove the blood clot or to place a filter in your abdomen to stop the blood clot from traveling to your lungs.  Treatments for a DVT are often divided into immediate treatment and long-term treatment (up to 3 months after DVT). You can work with your health care provider to choose the treatment program that is best for you. Follow these instructions at home: If you are taking a newer oral anticoagulant:  Take the medicine every single day at the same time each day.  Understand what foods and drugs interact with this medicine.  Understand that there are no regular blood tests required when using this medicine.  Understand the side effects of this medicine, including excessive bruising or bleeding. Ask your health care provider or pharmacist about other possible side effects. If you are taking warfarin:  Understand how to take warfarin and know which foods can affect how warfarin works in your body.  Understand that it is dangerous to take too much or too little warfarin. Too much warfarin increases the risk of bleeding. Too little warfarin continues to allow the risk for blood clots.  Follow your PT and INR blood testing schedule. The PT and INR results allow your health care provider to adjust your dose of warfarin. It is very important that you have your PT and INR tested as often as told by your health care provider.  Avoid major changes in your diet, or tell your health care provider before you change your diet. Arrange a visit with a registered dietitian to answer your  questions. Many foods, especially foods that are high in vitamin K, can interfere with warfarin and affect the PT and INR results. Eat a consistent amount of foods that are high in vitamin K, such as: ? Spinach, kale, broccoli, cabbage, collard greens, turnip greens, Brussels sprouts, peas, cauliflower, seaweed, and parsley. ? Beef liver and pork liver. ? Green tea. ? Soybean oil.  Tell your health care provider about any and all medicines, vitamins, and supplements that you take, including aspirin and other over-the-counter anti-inflammatory medicines. Be especially cautious with aspirin and anti-inflammatory medicines. Do not take those before you ask your health care provider if it is safe to do so. This is important because many medicines can interfere with warfarin and affect the PT and INR results.  Do not start or stop taking any over-the-counter or prescription medicine unless your health care provider or pharmacist tells you to do so. If you take warfarin, you will also need to do these things:  Hold pressure over cuts for longer than   usual.  Tell your dentist and other health care providers that you are taking warfarin before you have any procedures in which bleeding may occur.  Avoid alcohol or drink very small amounts. Tell your health care provider if you change your alcohol intake.  Do not use tobacco products, including cigarettes, chewing tobacco, and e-cigarettes. If you need help quitting, ask your health care provider.  Avoid contact sports.  General instructions  Take over-the-counter and prescription medicines only as told by your health care provider. Anticoagulant medicines can have side effects, including easy bruising and difficulty stopping bleeding. If you are prescribed an anticoagulant, you will also need to do these things: ? Hold pressure over cuts for longer than usual. ? Tell your dentist and other health care providers that you are taking anticoagulants  before you have any procedures in which bleeding may occur. ? Avoid contact sports.  Wear a medical alert bracelet or carry a medical alert card that says you have had a PE.  Ask your health care provider how soon you can go back to your normal activities. Stay active to prevent new blood clots from forming.  Make sure to exercise while traveling or when you have been sitting or standing for a long period of time. It is very important to exercise. Exercise your legs by walking or by tightening and relaxing your leg muscles often. Take frequent walks.  Wear compression stockings as told by your health care provider to help prevent more blood clots from forming.  Do not use tobacco products, including cigarettes, chewing tobacco, and e-cigarettes. If you need help quitting, ask your health care provider.  Keep all follow-up appointments with your health care provider. This is important. How is this prevented? Take these actions to decrease your risk of developing another DVT:  Exercise regularly. For at least 30 minutes every day, engage in: ? Activity that involves moving your arms and legs. ? Activity that encourages good blood flow through your body by increasing your heart rate.  Exercise your arms and legs every hour during long-distance travel (over 4 hours). Drink plenty of water and avoid drinking alcohol while traveling.  Avoid sitting or lying in bed for long periods of time without moving your legs.  Maintain a weight that is appropriate for your height. Ask your health care provider what weight is healthy for you.  If you are a woman who is over 35 years of age, avoid unnecessary use of medicines that contain estrogen. These include birth control pills.  Do not smoke, especially if you take estrogen medicines. If you need help quitting, ask your health care provider.  If you are hospitalized, prevention measures may include:  Early walking after surgery, as soon as your  health care provider says that it is safe.  Receiving anticoagulants to prevent blood clots.If you cannot take anticoagulants, other options may be available, such as wearing compression stockings or using different types of devices.  Get help right away if:  You have new or increased pain, swelling, or redness in an arm or leg.  You have numbness or tingling in an arm or leg.  You have shortness of breath while active or at rest.  You have chest pain.  You have a rapid or irregular heartbeat.  You feel light-headed or dizzy.  You cough up blood.  You notice blood in your vomit, bowel movement, or urine. These symptoms may represent a serious problem that is an emergency. Do not wait to see   if the symptoms will go away. Get medical help right away. Call your local emergency services (911 in the [place] Do not drive yourself to the hospital. This information is not intended to replace advice given to you by your health care provider. Make sure you discuss any questions you have with your health care provider. Document Released: 10/20/2005 Document Revised: 03/27/2016 Document Reviewed: 02/14/2015 [employer] Interactive Patient Education  2017 [employer]  

## 2017-09-01 NOTE — Progress Notes (Signed)
Tammy [name], is a 61 y.o. female  LGX:[id number]  CXK:[id number]  DOB - 10-16-1956  Chief Complaint  Patient presents with  . Anxiety      Subjective:   Tammy [name] is a 61 y.o. female with history of HTN, DVT and pulmonary embolism on Xarelto here today for a follow up visit. Patient has developed some rashes on her elbow joints and patchy around her body. She still has pain in her neck but much better. She is complaining of daytime somnolence and excessive fatigue. She is not sure if she snores or has sleep apnea. She reiterates much improvement with her anxiety, she has developed several coping mechanisms including sitting at the back of vehicle to avoid seeing moving ones that gives her phobia. She denies ay suicidal thoughts or ideation. Patient has No headache, No chest pain, No abdominal pain - No Nausea, No new weakness tingling or numbness, No Cough - SOB.  No problems updated.  ALLERGIES: No Known Allergies  PAST MEDICAL HISTORY: Past Medical History:  Diagnosis Date  . Chronic headaches   . DVT (deep venous thrombosis) ([place])   . Leukemia ([place])    Told she had it at age 65, given some shots  . Pulmonary embolism ([place])   . Seasonal allergies     MEDICATIONS AT HOME: Prior to Admission medications   Medication Sig Start Date End Date Taking? Authorizing Provider  acetaminophen-codeine (TYLENOL #3) 300-30 MG tablet Take 1 tablet by mouth every 4 (four) hours as needed for moderate pain. 05/13/17  Yes Tresa Garter, MD  atorvastatin (LIPITOR) 40 MG tablet Take 1 tablet (40 mg total) by mouth daily. 05/14/17  Yes Tresa Garter, MD  gabapentin (NEURONTIN) 300 MG capsule Take 1 capsule (300 mg total) by mouth 3 (three) times daily. 05/14/17  Yes Tresa Garter, MD  hydrOXYzine (ATARAX/VISTARIL) 25 MG tablet Take 1 tablet (25 mg total) by mouth 3 (three) times daily as needed for anxiety. 05/14/17  Yes Tresa Garter, MD  nortriptyline (PAMELOR) 25 MG  capsule Take 1 capsule (25 mg total) by mouth at bedtime. 05/14/17  Yes Tehillah Cipriani E, MD  XARELTO 20 MG TABS tablet Take 1 tablet (20 mg total) by mouth daily. 05/14/17  Yes Tresa Garter, MD  clobetasol cream (TEMOVATE) 9.70 % Apply 1 application topically 2 (two) times daily. 08/24/17   Tresa Garter, MD    Objective:   Vitals:   08/24/17 1428  BP: 109/72  Pulse: (!) 112  Resp: 18  Temp: 98.5 F (36.9 C)  TempSrc: Oral  SpO2: 95%  Weight: 193 lb (87.5 kg)  Height: 5\' 2"  (1.575 m)   Exam General appearance : Awake, alert, not in any distress. Speech Clear. Not toxic looking HEENT: Atraumatic and Normocephalic, pupils equally reactive to light and accomodation Neck: Supple, no JVD. No cervical lymphadenopathy.  Chest: Good air entry bilaterally, no added sounds  CVS: S1 S2 regular, no murmurs.  Abdomen: Bowel sounds present, Non tender and not distended with no gaurding, rigidity or rebound. Extremities: B/L Lower Ext shows no edema, both legs are warm to touch Neurology: Awake alert, and oriented X 3, CN II-XII intact, Non focal Skin: Psoriatic rashes  Data Review Lab Results  Component Value Date   HGBA1C 5.7 05/13/2017   HGBA1C 5.70 10/22/2015   HGBA1C 5.90 03/08/2015    Assessment & Plan   1. Dyslipidemia  To address this please limit saturated fat to no more than 7%  of your calories, limit cholesterol to 200 mg/day, increase fiber and exercise as tolerated. If needed we may add another cholesterol lowering medication to your regimen.   2. Psoriasis Refill - clobetasol cream (TEMOVATE) 0.05 %; Apply 1 application topically 2 (two) times daily.  Dispense: 60 g; Refill: 3  3. OSA (obstructive sleep apnea)  - Split night study; Future  Patient have been counseled extensively about nutrition and exercise. Other issues discussed during this visit include: low cholesterol diet, weight control and daily exercise, importance of adherence with  medications and regular follow-up.   Return in about 6 months (around 02/22/2018) for Follow up HTN, Generalized Anxiety Disorder.  The patient was given clear instructions to go to ER or return to medical center if symptoms don't improve, worsen or new problems develop. The patient verbalized understanding. The patient was told to call to get lab results if they haven't heard anything in the next week.   This note has been created with Surveyor, quantity. Any transcriptional errors are unintentional.    Angelica Chessman, MD, [place], [place], [place], [place] and [place], [place]   09/01/2017, 5:47 PM

## 2017-09-04 ENCOUNTER — Other Ambulatory Visit: Payer: Self-pay | Admitting: Obstetrics and Gynecology

## 2017-09-04 DIAGNOSIS — Z1231 Encounter for screening mammogram for malignant neoplasm of breast: Secondary | ICD-10-CM

## 2017-09-30 ENCOUNTER — Encounter (HOSPITAL_COMMUNITY): Payer: Self-pay

## 2017-10-01 ENCOUNTER — Ambulatory Visit
Admission: RE | Admit: 2017-10-01 | Discharge: 2017-10-01 | Disposition: A | Discharge status: Home / Self Care | Payer: No Typology Code available for payment source | Source: Ambulatory Visit | Attending: Obstetrics and Gynecology | Admitting: Obstetrics and Gynecology

## 2017-10-01 ENCOUNTER — Ambulatory Visit (HOSPITAL_COMMUNITY)
Admission: RE | Admit: 2017-10-01 | Discharge: 2017-10-01 | Disposition: A | Discharge status: Home / Self Care | Payer: Self-pay | Source: Ambulatory Visit | Attending: Obstetrics and Gynecology | Admitting: Obstetrics and Gynecology

## 2017-10-01 ENCOUNTER — Encounter (HOSPITAL_COMMUNITY): Payer: Self-pay

## 2017-10-01 VITALS — BP 118/86 | Temp 98.6°F | Ht 62.0 in | Wt 185.6 lb

## 2017-10-01 DIAGNOSIS — Z1239 Encounter for other screening for malignant neoplasm of breast: Secondary | ICD-10-CM

## 2017-10-01 DIAGNOSIS — Z1231 Encounter for screening mammogram for malignant neoplasm of breast: Secondary | ICD-10-CM

## 2017-10-01 NOTE — Patient Instructions (Signed)
Explained breast self awareness with Balinda Quails. Patient did not need a Pap smear today due to last Pap smear and HPV typing was 05/13/2017. Let her know B[facility]CP will cover Pap smears and HPV typing every 5 years unless has a history of abnormal Pap smears. Referred patient to the [facility] for a screening mammogram. Appointment scheduled for Thursday, October 01, 2017 at 1610.  Let patient know the [facility] will follow up with her within the next couple weeks with results of mammogram by letter or phone. Discussed smoking cessation with patient. Referred to the [facility] Quitline and gave resources to the free smoking cessation classes at [facility]. Balinda Quails verbalized understanding.  Brysyn Brandenberger, Arvil Chaco, RN 3:53 PM

## 2017-10-01 NOTE — Progress Notes (Signed)
No complaints today.   Pap Smear: Pap smear not completed today. Last Pap smear was 05/13/2017 at [facility] and normal with negative HPV. Per patient has no history of an abnormal Pap smear. Last Pap smear result is in Epic.  Physical exam: Breasts Breasts symmetrical. No skin abnormalities right breast. Observed a scar on the left breast at 10 o'clock next to areola due to a burn from from grease when cooking. No nipple retraction bilateral breasts. No nipple discharge bilateral breasts. No lymphadenopathy. No lumps palpated bilateral breasts. No complaints of pain or tenderness on exam. Referred patient to the [facility] for a screening mammogram. Appointment scheduled for Thursday, October 01, 2017 at 1610.        Pelvic/Bimanual No Pap smear completed today since last Pap smear and HPV typing was 05/13/2017. Pap smear not indicated per BCCCP guidelines.   Smoking History: Patient is a current smoker. Discussed smoking cessation with patient. Referred to the [facility] Quitline and gave resources to the free smoking cessation classes at [facility].  Patient Navigation: Patient education provided. Access to services provided for patient through [place] program.   Colorectal Cancer Screening: Per patient has never had a colonoscopy completed. No complaints today. FIT Test given to patient to complete and return to B[facility]CP.

## 2017-10-02 ENCOUNTER — Other Ambulatory Visit: Payer: Self-pay | Admitting: Hematology and Oncology

## 2017-10-02 ENCOUNTER — Encounter (HOSPITAL_COMMUNITY): Payer: Self-pay | Admitting: *Deleted

## 2017-10-02 ENCOUNTER — Telehealth: Payer: Self-pay

## 2017-10-02 DIAGNOSIS — I2782 Chronic pulmonary embolism: Secondary | ICD-10-CM

## 2017-10-02 MED ORDER — RIVAROXABAN 10 MG PO TABS: 10.0000 mg | ORAL_TABLET | Freq: Every day | ORAL | 11 refills | Status: DC

## 2017-10-02 NOTE — Telephone Encounter (Signed)
Patient left message to call her. She needs a copy of Xarelto Rx faxed to [facility]. Called back and told patient to take 10 mg Xarelto daily. Verbalized understanding. Xarelto Rx faxed to [facility] at [phone].

## 2017-10-07 ENCOUNTER — Encounter: Payer: Self-pay | Admitting: Neurology

## 2017-10-07 ENCOUNTER — Ambulatory Visit (INDEPENDENT_AMBULATORY_CARE_PROVIDER_SITE_OTHER): Payer: Self-pay | Admitting: Neurology

## 2017-10-07 VITALS — BP 120/70 | HR 108 | Ht 62.0 in | Wt 186.2 lb

## 2017-10-07 DIAGNOSIS — R202 Paresthesia of skin: Secondary | ICD-10-CM

## 2017-10-07 DIAGNOSIS — R200 Anesthesia of skin: Secondary | ICD-10-CM

## 2017-10-07 NOTE — Patient Instructions (Signed)
1.  Increase gabapentin 300mg  to 1 tablet in the morning, 1 tablet in the afternoon, and 2 tablets at bedtime  2.  Nerve testing of the hands.

## 2017-10-07 NOTE — Progress Notes (Signed)
[facility] Clinic Note - Initial Visit   Date: 10/07/17  LILYANNA LUNT MRN: [id number] DOB: [birth year]   Dear Dr. Ernst Spell:  Thank you for your kind referral of SHAKIAH WESTER for consultation of bilateral hand numbness/tingling. Although her history is well known to you, please allow [place] to reiterate it for the purpose of our medical record. The patient was accompanied to the clinic by self.    History of Present Illness: LEMPI EDWIN is a 61 y.o. 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    She complains of numbness numbness and tingling of the hands which started about a year ago.  It involves her entire palmer side of the hands and both are affected equally. She also has cold sensation of the upper arms.  She has some weakness of her grip, such as holding a phone or opening jars.  Symptoms are worse at night time and often wake her up from sleeping.  She denies any chronic neck pain.  Currently, she takes gabapentin 300mg  three times daily and nortriptyline 25mg  , which does not provide any relief.  The only thing that provides some relief is applying steelwool on her hands to relieve some of the numbness.  She works as a Aeronautical engineer at [employer].  No prior history of diabetes or thyroid problems.  Out-side paper records, electronic medical record, and images have been reviewed where available and summarized as:  Lab Results  Component Value Date   TSH 0.545 05/13/2017   Lab Results  Component Value Date   HGBA1C 5.7 05/13/2017   MRI cervical spine wo contrast 02/14/2009: Foraminal stenosis on the right at C6-7 that could compress the right C7 nerve root.  Past Medical History:  Diagnosis Date  . Chronic headaches   . DVT (deep venous thrombosis) ([place])   . Leukemia ([place])    Told she had it at age 29, given some shots  . Pulmonary  embolism ([place])   . Seasonal allergies     Past Surgical History:  Procedure Laterality Date  . CESAREAN SECTION     one previous  . right arm fracture    . TUBAL LIGATION       Medications:  Outpatient Encounter Medications as of 10/07/2017  Medication Sig  . acetaminophen-codeine (TYLENOL #3) 300-30 MG tablet Take 1 tablet by mouth every 4 (four) hours as needed for moderate pain.  Marland Kitchen atorvastatin (LIPITOR) 40 MG tablet Take 1 tablet (40 mg total) by mouth daily.  . clobetasol cream (TEMOVATE) 2.12 % Apply 1 application topically 2 (two) times daily.  Marland Kitchen gabapentin (NEURONTIN) 300 MG capsule Take 1 capsule (300 mg total) by mouth 3 (three) times daily.  . hydrOXYzine (ATARAX/VISTARIL) 25 MG tablet Take 1 tablet (25 mg total) by mouth 3 (three) times daily as needed for anxiety.  . nortriptyline (PAMELOR) 25 MG capsule Take 1 capsule (25 mg total) by mouth at bedtime.  . rivaroxaban (XARELTO) 10 MG TABS tablet Take 1 tablet (10 mg total) by mouth daily.  Alveda Reasons 20 MG TABS tablet Take 1 tablet (20 mg total) by mouth daily.   No facility-administered encounter medications on file as of 10/07/2017.    Allergies: No Known Allergies  Family History: Family History  Problem Relation Age of Onset  . Emphysema Father   . Breast cancer Mother   . Breast cancer Maternal Grandmother   .  Heart disease Sister   . Cancer Sister   . Diabetes Brother     Social History: Social History   Tobacco Use  . Smoking status: Light Tobacco Smoker    Packs/day: 0.00    Years: 40.00    Pack years: 0.00    Types: Cigarettes  . Smokeless tobacco: Never Used  . Tobacco comment: Patient reports smoking 2-3 cigarettes a day   Substance Use Topics  . Alcohol use: No  . Drug use: No   Social History   Social History Narrative   Lives alone   Recent travel to [place] 01/2011    Review of Systems:  CONSTITUTIONAL: No fevers, chills, night sweats, or weight loss.   EYES: No visual changes  or eye pain ENT: No hearing changes.  No history of nose bleeds.   RESPIRATORY: No cough, wheezing and shortness of breath.   CARDIOVASCULAR: Negative for chest pain, and palpitations.   GI: Negative for abdominal discomfort, blood in stools or black stools.  No recent change in bowel habits.   GU:  No history of incontinence.   MUSCLOSKELETAL: No history of joint pain or swelling.  No myalgias.   SKIN: Negative for lesions, rash, and itching.   HEMATOLOGY/ONCOLOGY: Negative for prolonged bleeding, bruising easily, and swollen nodes.  No history of cancer.   ENDOCRINE: Negative for cold or heat intolerance, polydipsia or goiter.   PSYCH:  +depression or anxiety symptoms.   NEURO: As Above.   Vital Signs:  BP 120/70   Pulse (!) 108   Ht 5\' 2"  (1.575 m)   Wt 186 lb 4 oz (84.5 kg)   SpO2 96%   BMI 34.07 kg/m    General Medical Exam:   General:  Well appearing, comfortable.   Eyes/ENT: see cranial nerve examination.   Neck: No masses appreciated.  Full range of motion without tenderness.  No carotid bruits. Respiratory:  Clear to auscultation, good air entry bilaterally.   Cardiac:  Regular rate and rhythm, no murmur.   Extremities:  No deformities, edema, or skin discoloration.  Skin:  No rashes or lesions.  Neurological Exam: MENTAL STATUS including orientation to time, place, person, recent and remote memory, attention span and concentration, language, and fund of knowledge is fair. Speech is not dysarthric.  CRANIAL NERVES: II:  No visual field defects.  Unremarkable fundi.   III-IV-VI: Pupils equal round and reactive to light.  Normal conjugate, extra-ocular eye movements in all directions of gaze.  No nystagmus.  No ptosis.   V:  Normal facial sensation.     VII:  Normal facial symmetry and movements.    VIII:  Normal hearing and vestibular function.   IX-X:  Normal palatal movement.   XI:  Normal shoulder shrug and head rotation.   XII:  Normal tongue strength and range  of motion, no deviation or fasciculation.  MOTOR:  No atrophy, fasciculations or abnormal movements.  No pronator drift.  Tone is normal.    Right Upper Extremity:    Left Upper Extremity:    Deltoid  5/5   Deltoid  5/5   Biceps  5/5   Biceps  5/5   Triceps  5/5   Triceps  5/5   Wrist extensors  5/5   Wrist extensors  5/5   Wrist flexors  5/5   Wrist flexors  5/5   Finger extensors  5/5   Finger extensors  5/5   Finger flexors  5/5   Finger flexors  5/5  Dorsal interossei  5/5   Dorsal interossei  5/5   Abductor pollicis  5/5   Abductor pollicis  5/5   Tone (Ashworth scale)  0  Tone (Ashworth scale)  0   Right Lower Extremity:    Left Lower Extremity:    Hip flexors  5/5   Hip flexors  5/5   Hip extensors  5/5   Hip extensors  5/5   Knee flexors  5/5   Knee flexors  5/5   Knee extensors  5/5   Knee extensors  5/5   Dorsiflexors  5/5   Dorsiflexors  5/5   Plantarflexors  5/5   Plantarflexors  5/5   Toe extensors  5/5   Toe extensors  5/5   Toe flexors  5/5   Toe flexors  5/5   Tone (Ashworth scale)  0  Tone (Ashworth scale)  0   MSRs:  Right                                                                 Left brachioradialis 2+  brachioradialis 2+  biceps 2+  biceps 2+  triceps 2+  triceps 2+  patellar 2+  patellar 2+  ankle jerk 2+  ankle jerk 2+  Hoffman no  Hoffman no  plantar response down  plantar response down   SENSORY:  Reduced sensation to pin prick and temperature over the palmer surface of the hands.  Rhomberg sign is negative.  COORDINATION/GAIT: Normal finger-to- nose-finger.  Intact rapid alternating movements bilaterally. Gait is slightly antalgic due to knee pain.   IMPRESSION: Ms. Barros is a 61 year-old female referred for evaluation of bilateral hand paresthesias and pain.  Exam shows diffuse reduced sensation over the entire palmer surface of the hand bilaterally, reflexes and motor strength is intact.  Previous MRI cervical spine shows possible right C7  radiculopathy, but this would manifest with forearm symptoms.  Recommend NCS/EMG of the hand to better characterize the nature of her symptom.  Entrapment neuropathy such as CTS vs. Ulnar neuropathy is high on the differential.  Less likely neuropathy given intact sensation in the legs. For pain, recommend increasing gabapentin to 600mg  at bedtime, continue 300mg  in the morning and afternoon.  Further recommendations will be based on the results of her electrodiagnostic testing.   Thank you for allowing me to participate in patient's care.  If I can answer any additional questions, I would be pleased to do so.    Sincerely,    Mallorie Norrod K. Posey Pronto, DO

## 2017-10-15 ENCOUNTER — Ambulatory Visit (INDEPENDENT_AMBULATORY_CARE_PROVIDER_SITE_OTHER): Payer: Self-pay | Admitting: Neurology

## 2017-10-15 DIAGNOSIS — R202 Paresthesia of skin: Secondary | ICD-10-CM

## 2017-10-15 DIAGNOSIS — G6289 Other specified polyneuropathies: Secondary | ICD-10-CM

## 2017-10-15 DIAGNOSIS — R200 Anesthesia of skin: Secondary | ICD-10-CM

## 2017-10-15 NOTE — Procedures (Signed)
[facility]  [address], [address]  [place], [place] [address] Tel: [phone] Fax:  [phone] Test Date:  10/15/2017  Patient: Tammy [name] DOB: Dec 30, 1955 Physician: Narda Amber, DO  Sex: Female Height: 5\' 2"  Ref Phys:   ID#: [id number] Temp: 36.0C Technician:    Patient Complaints: This is a 61 year old female referred for evaluation of bilateral hand pain and paresthesias.  NCV & EMG Findings: Extensive electrodiagnostic testing of the right upper extremity and additional studies of the left shows:  1. Right median and bilateral ulnar sensory responses show prolonged latencies and normal amplitude. The left median sensory response is absent.  Bilateral radial motor responses are within normal limits. 2. Right median motor response shows prolonged latency (6.0 ms) and normal amplitude. Left median motor response shows severely prolonged latency (9.7 ms) and reduced amplitude (3.0 mV).  The left ulnar motor, the right ulnar motor, and the left Ulnar (FDI) motor nerves showed reduced amplitude (L6.8, R4.6, L3.7 mV) and decreased conduction velocity (A Elbow-B Elbow, L31, R40, L42 m/s).  The right Ulnar (FDI) motor nerve showed prolonged distal onset latency (4.7 ms), reduced amplitude (1.7 mV), and decreased conduction velocity (A Elbow-B Elbow, 32 m/s). Bilateral ulnar nerves also show temporal dispersion. 3. Chronic motor axon loss changes are seen affecting the ulnar innervated muscles and abductor pollicis brevis muscles bilaterally, with active denervation involving the right first dorsal interosseous and abductor digit minimi muscles.   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   ___________________________ Narda Amber, DO    Nerve Conduction Studies Anti Sensory Summary  Table   Site NR Peak (ms) Norm Peak (ms) P-T Amp (V) Norm P-T Amp  Left Median Anti Sensory (2nd Digit)  36C  Wrist NR  <3.8  >10  Right Median Anti Sensory (2nd Digit)  36C  Wrist    9.0 <3.8 11.3 >10  Left Radial Anti Sensory (Base 1st Digit)  36C  Wrist    1.9 <2.8 35.3 >10  Right Radial Anti Sensory (Base 1st Digit)  36C  Wrist    2.0 <2.8 38.9 >10  Left Ulnar Anti Sensory (5th Digit)  36C  Wrist    4.5 <3.2 12.2 >5  Right Ulnar Anti Sensory (5th Digit)  36C  Wrist    5.0 <3.2 10.6 >5   Motor Summary Table   Site NR Onset (ms) Norm Onset (ms) O-P Amp (mV) Norm O-P Amp Site1 Site2 Delta-0 (ms) Dist (cm) Vel (m/s) Norm Vel (m/s)  Left Median Motor (Abd Poll Brev)  36C  Wrist    9.7 <4.0 3.0 >5 Elbow Wrist 4.8 26.0 54 >50  Elbow    14.5  2.7         Right Median Motor (Abd Poll Brev)  36C  Wrist    6.0 <4.0 7.5 >5 Elbow Wrist 4.3 27.0 63 >50  Elbow    10.3  7.5         Left Ulnar Motor (Abd Dig Minimi)  36C  Wrist    2.6 <3.1 6.8 >7 B Elbow Wrist 3.8 23.0 61 >50  B Elbow    6.4  5.8  A Elbow B Elbow 3.2 10.0 31 >50  A Elbow    9.6  5.1         Right Ulnar Motor (Abd Dig Minimi)  36C  Wrist  3.1 <3.1 4.6 >7 B Elbow Wrist 4.2 23.0 55 >50  B Elbow    7.3  2.4  A Elbow B Elbow 2.5 10.0 40 >50  A Elbow    9.8  2.3         Left Ulnar (FDI) Motor (1st DI)  36C  Wrist    4.0 <4.5 3.7 >7 B Elbow Wrist 3.7 23.0 62 >50  B Elbow    7.7  3.6  A Elbow B Elbow 2.4 10.0 42 >50  A Elbow    10.1  3.5         Right Ulnar (FDI) Motor (1st DI)  36C  Wrist    4.7 <4.5 1.7 >7 B Elbow Wrist 3.7 23.0 62 >50  B Elbow    8.4  0.5  A Elbow B Elbow 3.1 10.0 32 >50  A Elbow    11.5  0.5          EMG   Side Muscle Ins Act Fibs Psw Fasc Number Recrt Dur Dur. Amp Amp. Poly Poly. Comment  Right 1stDorInt Nml 1+ Nml Nml 3- Rapid Most 1+ Most 1+ Most 1+ N/A  Right Abd Poll Brev Nml Nml Nml Nml 1- Rapid Some 1+ Some 1+ Nml Nml N/A  Right ABD Dig Min Nml 1+ Nml Nml SMU Rapid All 1+ All 1+  All 1+ N/A  Right Ext Indicis Nml Nml Nml Nml Nml Nml Nml Nml Nml Nml Nml Nml N/A  Right PronatorTeres Nml Nml Nml Nml Nml Nml Nml Nml Nml Nml Nml Nml N/A  Right Biceps Nml Nml Nml Nml Nml Nml Nml Nml Nml Nml Nml Nml N/A  Right Triceps Nml Nml Nml Nml Nml Nml Nml Nml Nml Nml Nml Nml N/A  Right Deltoid Nml Nml Nml Nml Nml Nml Nml Nml Nml Nml Nml Nml N/A  Right FlexCarpiUln Nml Nml Nml Nml 1- Rapid Some 1+ Some 1+ Nml Nml N/A  Right ExtDigMin Nml Nml Nml Nml Nml Nml Nml Nml Nml Nml Nml Nml N/A  Left 1stDorInt Nml Nml Nml Nml 2- Rapid Some 1+ Some 1+ Some 1+ N/A  Left ABD Dig Min Nml Nml Nml Nml 2- Rapid Some 1+ Some 1+ Some 1+ N/A  Left FlexCarpiUln Nml Nml Nml Nml 1- Rapid Few 1+ Few 1+ Nml Nml N/A  Left Abd Poll Brev Nml Nml Nml Nml 1- Rapid Some 1+ Some 1+ Nml Nml N/A  Left Ext Indicis Nml Nml Nml Nml Nml Nml Nml Nml Nml Nml Nml Nml N/A  Left PronatorTeres Nml Nml Nml Nml Nml Nml Nml Nml Nml Nml Nml Nml N/A  Left Biceps Nml Nml Nml Nml Nml Nml Nml Nml Nml Nml Nml Nml N/A  Left Triceps Nml Nml Nml Nml Nml Nml Nml Nml Nml Nml Nml Nml N/A  Left Deltoid Nml Nml Nml Nml Nml Nml Nml Nml Nml Nml Nml Nml N/A      Waveforms:

## 2017-10-20 ENCOUNTER — Telehealth: Payer: Self-pay | Admitting: Neurology

## 2017-10-20 ENCOUNTER — Encounter (HOSPITAL_COMMUNITY): Payer: Self-pay

## 2017-10-20 ENCOUNTER — Other Ambulatory Visit: Payer: Self-pay

## 2017-10-20 NOTE — Telephone Encounter (Signed)
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  Recommend additional testing to include (1) NCS/EMG of the legs and (2) lab testing as follows:  Check ESR, CRP, CMP, CBC, vitamin B12, vitamin B1, copper, MMA, folate, SPEP with IFE,  ANA, SSA/B, ACE, heavy metal screen   Mariza Bourget K. Posey Pronto, DO

## 2017-10-21 ENCOUNTER — Other Ambulatory Visit: Payer: Self-pay | Admitting: *Deleted

## 2017-10-21 DIAGNOSIS — R202 Paresthesia of skin: Secondary | ICD-10-CM

## 2017-10-21 DIAGNOSIS — G629 Polyneuropathy, unspecified: Secondary | ICD-10-CM

## 2017-10-21 DIAGNOSIS — G6289 Other specified polyneuropathies: Secondary | ICD-10-CM

## 2017-10-21 DIAGNOSIS — R200 Anesthesia of skin: Secondary | ICD-10-CM

## 2017-10-21 NOTE — Telephone Encounter (Signed)
EMG and lab worked ordered.

## 2017-10-22 ENCOUNTER — Ambulatory Visit (HOSPITAL_BASED_OUTPATIENT_CLINIC_OR_DEPARTMENT_OTHER): Payer: Self-pay | Attending: Internal Medicine | Admitting: Internal Medicine

## 2017-10-22 VITALS — Ht 62.0 in | Wt 193.0 lb

## 2017-10-22 DIAGNOSIS — G4733 Obstructive sleep apnea (adult) (pediatric): Secondary | ICD-10-CM | POA: Insufficient documentation

## 2017-10-26 LAB — FECAL OCCULT BLOOD, IMMUNOCHEMICAL: Fecal Occult Bld: NEGATIVE

## 2017-10-26 LAB — SPECIMEN STATUS REPORT

## 2017-10-29 ENCOUNTER — Other Ambulatory Visit (INDEPENDENT_AMBULATORY_CARE_PROVIDER_SITE_OTHER): Payer: Self-pay

## 2017-10-29 ENCOUNTER — Other Ambulatory Visit: Payer: Self-pay | Admitting: *Deleted

## 2017-10-29 ENCOUNTER — Ambulatory Visit (INDEPENDENT_AMBULATORY_CARE_PROVIDER_SITE_OTHER): Payer: Self-pay | Admitting: Neurology

## 2017-10-29 DIAGNOSIS — R202 Paresthesia of skin: Secondary | ICD-10-CM

## 2017-10-29 DIAGNOSIS — R200 Anesthesia of skin: Secondary | ICD-10-CM

## 2017-10-29 DIAGNOSIS — G629 Polyneuropathy, unspecified: Secondary | ICD-10-CM

## 2017-10-29 DIAGNOSIS — G6289 Other specified polyneuropathies: Secondary | ICD-10-CM

## 2017-10-29 LAB — CBC
HEMATOCRIT: 40.5 % (ref 36.0–46.0)
HEMOGLOBIN: 13.6 g/dL (ref 12.0–15.0)
MCHC: 33.5 g/dL (ref 30.0–36.0)
MCV: 91.5 fl (ref 78.0–100.0)
Platelets: 332 10*3/uL (ref 150.0–400.0)
RBC: 4.43 Mil/uL (ref 3.87–5.11)
RDW: 13.4 % (ref 11.5–15.5)
WBC: 7.9 10*3/uL (ref 4.0–10.5)

## 2017-10-29 LAB — COMPREHENSIVE METABOLIC PANEL
ALBUMIN: 4.2 g/dL (ref 3.5–5.2)
ALK PHOS: 89 U/L (ref 39–117)
ALT: 17 U/L (ref 0–35)
AST: 18 U/L (ref 0–37)
BILIRUBIN TOTAL: 0.8 mg/dL (ref 0.2–1.2)
BUN: 12 mg/dL (ref 6–23)
CO2: 27 mEq/L (ref 19–32)
Calcium: 9.5 mg/dL (ref 8.4–10.5)
Chloride: 103 mEq/L (ref 96–112)
Creatinine, Ser: 1.09 mg/dL (ref 0.40–1.20)
GFR: 65.84 mL/min (ref >60.00)
Glucose, Bld: 114 mg/dL — ABNORMAL HIGH (ref 70–99)
POTASSIUM: 4.4 meq/L (ref 3.5–5.1)
SODIUM: 137 meq/L (ref 135–145)
TOTAL PROTEIN: 7.6 g/dL (ref 6.0–8.3)

## 2017-10-29 LAB — SEDIMENTATION RATE: Sed Rate: 72 mm/hr — ABNORMAL HIGH (ref 0–30)

## 2017-10-29 LAB — C-REACTIVE PROTEIN: CRP: 0.3 mg/dL — AB (ref 0.5–20.0)

## 2017-10-29 LAB — FOLATE: Folate: 15.6 ng/mL (ref >5.9)

## 2017-10-29 LAB — VITAMIN B12: Vitamin B-12: 434 pg/mL (ref 211–911)

## 2017-10-29 NOTE — Procedures (Signed)
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  NCV & EMG Findings: Extensive electrodiagnostic testing of the right lower extremity and additional studies of the left shows:  1. Bilateral sural and superficial peroneal sensory responses are within normal limits. 2. Bilateral peroneal and tibial motor responses are within normal limits. 3. Right tibial H reflex study is mildly prolonged. In isolation, these findings are unclear clinical significance. 4. There is no evidence of active or chronic motor axon loss changes affecting any of the tested muscles. Motor unit configuration and recruitment pattern is within normal limits. Proximal and deep muscles were not tested as the patient is on anticoagulation therapy.  Impression: This is a normal study of the lower extremities. In particular, there is no evidence of a sensorimotor polyneuropathy affecting the legs.   ___________________________ Narda Amber, DO    Nerve Conduction Studies Anti Sensory Summary Table   Site NR Peak (ms) Norm Peak (ms) P-T Amp (V) Norm P-T Amp  Left Sup Peroneal Anti Sensory (Ant Lat Mall)  33.8C  12 cm    2.6 <4.6 23.0 >3  Right Sup Peroneal Anti Sensory (Ant Lat Mall)  33.8C  12 cm    2.5 <4.6 23.1 >3  Left Sural Anti Sensory (Lat Mall)  33.8C  Calf    3.6 <4.6 26.1 >3  Right Sural Anti Sensory (Lat Mall)  33.8C  Calf    2.4 <4.6 30.4 >3   Motor Summary Table   Site NR Onset (ms) Norm Onset (ms) O-P Amp (mV) Norm O-P Amp Site1 Site2 Delta-0 (ms) Dist (cm) Vel (m/s) Norm  Vel (m/s)  Left Peroneal Motor (Ext Dig Brev)  33.8C  Ankle    3.9 <6.0 6.9 >2.5 B Fib Ankle 7.4 36.0 49 >40  B Fib    11.3  6.3  Poplt B Fib 1.4 8.0 57 >40  Poplt    12.7  6.3         Right Peroneal Motor (Ext Dig Brev)  33.8C  Ankle    3.8 <6.0 7.1 >2.5 B Fib Ankle 7.5 36.0 48 >40  B Fib    11.3  6.6  Poplt B Fib 1.4 8.0 57 >40  Poplt    12.7  6.3         Left Peroneal TA Motor (Tib Ant)  33.8C  Fib Head    2.1 <4.5 5.4 >3 Poplit Fib Head 1.3 8.0 62 >40  Poplit    3.4  5.4         Right Peroneal TA Motor (Tib Ant)  33.8C  Fib Head    2.4 <4.5 5.5 >3 Poplit Fib Head 1.0 8.0 80 >40  Poplit    3.4  5.5         Left Tibial Motor (Abd Hall Brev)  33.8C  Ankle    3.4 <6.0 8.8 >4 Knee Ankle 9.5 38.0 40 >40  Knee    12.9  6.9         Right Tibial Motor (Abd Hall Brev)  33.8C  Ankle    4.9 <6.0 8.8 >4  Knee Ankle 8.1 37.0 46 >40  Knee    13.0  5.2          H Reflex Studies   NR H-Lat (ms) Lat Norm (ms) L-R H-Lat (ms)  Right Tibial (Gastroc)  33.8C     37.28 <35    EMG   Side Muscle Ins Act Fibs Psw Fasc Number Recrt Dur Dur. Amp Amp. Poly Poly. Comment  Right AntTibialis Nml Nml Nml Nml Nml Nml Nml Nml Nml Nml Nml Nml N/A  Right Gastroc Nml Nml Nml Nml Nml Nml Nml Nml Nml Nml Nml Nml N/A  Right RectFemoris Nml Nml Nml Nml Nml Nml Nml Nml Nml Nml Nml Nml N/A  Right BicepsFemS Nml Nml Nml Nml Nml Nml Nml Nml Nml Nml Nml Nml N/A  Left AntTibialis Nml Nml Nml Nml Nml Nml Nml Nml Nml Nml Nml Nml N/A  Left Gastroc Nml Nml Nml Nml Nml Nml Nml Nml Nml Nml Nml Nml N/A      Waveforms:

## 2017-10-30 ENCOUNTER — Encounter (HOSPITAL_COMMUNITY): Payer: Self-pay

## 2017-11-02 ENCOUNTER — Encounter

## 2017-11-02 DIAGNOSIS — G4733 Obstructive sleep apnea (adult) (pediatric): Secondary | ICD-10-CM

## 2017-11-04 ENCOUNTER — Telehealth: Payer: Self-pay | Admitting: Neurology

## 2017-11-04 LAB — METHYLMALONIC ACID, SERUM: METHYLMALONIC ACID, QUANT: 122 nmol/L (ref 87–318)

## 2017-11-04 LAB — PROTEIN ELECTROPHORESIS, SERUM
ALBUMIN ELP: 4.1 g/dL (ref 3.8–4.8)
ALPHA 1: 0.3 g/dL (ref 0.2–0.3)
Alpha 2: 0.9 g/dL (ref 0.5–0.9)
BETA 2: 0.4 g/dL (ref 0.2–0.5)
BETA GLOBULIN: 0.5 g/dL (ref 0.4–0.6)
GAMMA GLOBULIN: 1.1 g/dL (ref 0.8–1.7)
TOTAL PROTEIN: 7.3 g/dL (ref 6.1–8.1)

## 2017-11-04 LAB — ANTI-NUCLEAR AB-TITER (ANA TITER)

## 2017-11-04 LAB — ANA: Anti Nuclear Antibody(ANA): POSITIVE — AB

## 2017-11-04 LAB — HEAVY METALS PANEL, BLOOD
Arsenic: <10 mcg/L (ref <23)
LEAD: 2 ug/dL (ref <5)
Mercury, B: <5 mcg/L (ref 0–10)

## 2017-11-04 LAB — SJOGREN'S SYNDROME ANTIBODS(SSA + SSB)
SSA (Ro) (ENA) Antibody, IgG: <1 AI
SSB (La) (ENA) Antibody, IgG: <1 AI

## 2017-11-04 LAB — IMMUNOFIXATION ELECTROPHORESIS
IGG (IMMUNOGLOBIN G), SERUM: 1127 mg/dL (ref 694–1618)
IGM, SERUM: 148 mg/dL (ref 48–271)
IMMUNOFIX ELECTR INT: NOT DETECTED
IMMUNOGLOBULIN A: 263 mg/dL (ref 81–463)

## 2017-11-04 LAB — ANGIOTENSIN CONVERTING ENZYME: Angiotensin-Converting Enzyme: 77 U/L — ABNORMAL HIGH (ref 9–67)

## 2017-11-04 LAB — COPPER, SERUM: Copper: 153 ug/dL (ref 70–175)

## 2017-11-04 LAB — VITAMIN B1: VITAMIN B1 (THIAMINE): 7 nmol/L — AB (ref 8–30)

## 2017-11-04 NOTE — Telephone Encounter (Signed)
Did you call this patient?

## 2017-11-04 NOTE — Telephone Encounter (Signed)
Patient left VM stating someone called her when I returned her call she could not tell me who called or why they called

## 2017-11-04 NOTE — Telephone Encounter (Signed)
Returned call to patient and informed her that vitamin B1 is very low and recommend starting vitamin B1 147m daily.  She also has mildly elevated ACE, high ESR, and low positive ANA (1:80) suggestive of underlying inflammation.    I would like to proceed to evaluation for polyradiculoneuropathy with CSF testing.  Check CSF cell count and diff, protein, glucose, IgG index, oligoclonal bands, myelin basic protein, flow cytology, ACE.  All questions answered.  Donika K. PPosey Pronto DO

## 2017-11-05 ENCOUNTER — Other Ambulatory Visit: Payer: Self-pay

## 2017-11-05 ENCOUNTER — Other Ambulatory Visit: Payer: Self-pay | Admitting: *Deleted

## 2017-11-05 DIAGNOSIS — M5410 Radiculopathy, site unspecified: Secondary | ICD-10-CM

## 2017-11-05 MED ORDER — RIVAROXABAN 10 MG PO TABS: 10.0000 mg | ORAL_TABLET | Freq: Every day | ORAL | 11 refills | Status: DC

## 2017-11-05 NOTE — Telephone Encounter (Signed)
Order placed and left message for Angelita Ingles to please schedule the appointment.

## 2017-11-06 ENCOUNTER — Telehealth: Payer: Self-pay | Admitting: *Deleted

## 2017-11-06 NOTE — Telephone Encounter (Signed)
Refill for Xarelto faxed to [facility]

## 2017-11-06 NOTE — Telephone Encounter (Signed)
Form faxed to [facility]. OK to stop Xarelto x 1 day for LP

## 2017-11-06 NOTE — Procedures (Signed)
   Patient Name: Tammy [name], Tammy [name] Date: 10/22/2017 Gender: Female D.O.B: 04/29/1956 Age (years): 63 Referring Provider: Angelica Chessman Height (inches): 34 Interpreting Physician: Baird Lyons MD, ABSM Weight (lbs): 193 RPSGT: Baxter Flattery BMI: 35 MRN: [id number] Neck Size: 16.00 <br> <br> CLINICAL INFORMATION Sleep Study Type: NPSG  Indication for sleep study: Fatigue, Obesity, OSA, Snoring, Witnessed Apneas  Epworth Sleepiness Score: 9  SLEEP STUDY TECHNIQUE As per the AASM Manual for the Scoring of Sleep and Associated Events v2.3 (April 2016) with a hypopnea requiring 4% desaturations.  The channels recorded and monitored were frontal, central and occipital EEG, electrooculogram (EOG), submentalis EMG (chin), nasal and oral airflow, thoracic and abdominal wall motion, anterior tibialis EMG, snore microphone, electrocardiogram, and pulse oximetry.  MEDICATIONS Medications self-administered by patient taken the night of the study : none reported  SLEEP ARCHITECTURE The study was initiated at 11:00:18 PM and ended at 5:18:32 AM.  Sleep onset time was 35.3 minutes and the sleep efficiency was 85.3%. The total sleep time was 322.5 minutes.  Stage REM latency was 71.5 minutes.  The patient spent 2.48% of the night in stage N1 sleep, 68.99% in stage N2 sleep, 0.00% in stage N3 and 28.53% in REM.  Alpha intrusion was absent.  Supine sleep was 10.61%.  RESPIRATORY PARAMETERS The overall apnea/hypopnea index (AHI) was 9.7 per hour. There were 20 total apneas, including 14 obstructive, 3 central and 3 mixed apneas. There were 32 hypopneas and 0 RERAs.  The AHI during Stage REM sleep was 30.0 per hour.  AHI while supine was 22.8 per hour.  The mean oxygen saturation was 92.91%. The minimum SpO2 during sleep was 81.00%.  soft snoring was noted during this study.  CARDIAC DATA The 2 lead EKG demonstrated sinus rhythm. The mean heart rate was 79.90 beats per minute.  Other EKG findings include: None.  LEG MOVEMENT DATA The total PLMS were 0 with a resulting PLMS index of 0.00. Associated arousal with leg movement index was 0.0 .  IMPRESSIONS - Mild obstructive sleep apnea occurred during this study (AHI = 9.7/h). - Insufficent early events to meet protocol requirements for split CPAP titration on this study. - No significant central sleep apnea occurred during this study (CAI = 0.6/h). - Mild oxygen desaturation was noted during this study (Min O2 = 81.00%). - The patient snored with soft snoring volume. - No cardiac abnormalities were noted during this study. - Clinically significant periodic limb movements did not occur during sleep. No significant associated arousals  DIAGNOSIS - Obstructive Sleep Apnea (327.23 [G47.33 ICD-10])  RECOMMENDATIONS - Tratment for mild OSA is directed at symptoms. Weight loss and observation might be sufficient. A chin strap, oral appliance or CPAP might be options, depending on clinical judgment. - Positional therapy avoiding supine position during sleep. - Sleep hygiene should be reviewed to assess factors that may improve sleep quality. - Weight management and regular exercise should be initiated or continued if appropriate.  [Electronically signed] 11/02/2017 03:51 PM  Baird Lyons MD, [place], American Board of Sleep Medicine   NPI: [id number]                          [place], [facility]  ELECTRONICALLY SIGNED ON:  11/06/2017, 2:28 PM [place] PH: [phone]   FX: [phone] [place]

## 2017-11-10 ENCOUNTER — Other Ambulatory Visit (HOSPITAL_COMMUNITY)
Admission: RE | Admit: 2017-11-10 | Discharge: 2017-11-10 | Disposition: A | Discharge status: Home / Self Care | Payer: No Typology Code available for payment source | Source: Ambulatory Visit | Attending: Neurology | Admitting: Neurology

## 2017-11-10 ENCOUNTER — Ambulatory Visit
Admission: RE | Admit: 2017-11-10 | Discharge: 2017-11-10 | Disposition: A | Discharge status: Home / Self Care | Payer: No Typology Code available for payment source | Source: Ambulatory Visit | Attending: Neurology | Admitting: Neurology

## 2017-11-10 VITALS — BP 95/55 | HR 86

## 2017-11-10 DIAGNOSIS — G629 Polyneuropathy, unspecified: Secondary | ICD-10-CM

## 2017-11-10 DIAGNOSIS — M5410 Radiculopathy, site unspecified: Secondary | ICD-10-CM

## 2017-11-10 NOTE — Progress Notes (Signed)
One SST tube of blood drawn from right AC space for LP labs; site unremarkable. 

## 2017-11-10 NOTE — Discharge Instructions (Signed)
Lumbar Puncture Discharge Instructions  1. Go home and rest quietly for the next 24 hours.  It is important to lie flat for the next 24 hours.  Get up only to go to the restroom.  You may lie in the bed or on a couch on your back, your stomach, your left side or your right side.  You may have one pillow under your head.  You may have pillows between your knees while you are on your side or under your knees while you are on your back.  2. DO NOT drive today.  Recline the seat as far back as it will go, while still wearing your seat belt, on the way home.  3. You may get up to go to the bathroom as needed.  You may sit up for 10 minutes to eat.  You may resume your normal diet and medications unless otherwise indicated.  Drink plenty of extra fluids today and tomorrow.  4. The incidence of a spinal headache with nausea and/or vomiting is about 5% (one in 20 patients).  If you develop a headache, lie flat and drink plenty of fluids until the headache goes away.  Caffeinated beverages may be helpful.  If you develop severe nausea and vomiting or a headache that does not go away with flat bed rest, please call the physician who sent you here.  5. You may resume normal activities after your 24 hours of bed rest is over; however, do not exert yourself strongly or do any heavy lifting tomorrow.  Please call us at [phone] with any questions or concerns.  6. Call your physician for a follow-up appointment.    You may resume Xarelto today.  

## 2017-11-14 ENCOUNTER — Ambulatory Visit: Payer: BLUE CROSS/BLUE SHIELD | Primary: Family Medicine

## 2017-11-17 ENCOUNTER — Encounter: Payer: No Typology Code available for payment source | Admitting: Neurology

## 2017-11-17 ENCOUNTER — Telehealth: Payer: Self-pay | Admitting: Neurology

## 2017-11-17 LAB — CSF CELL COUNT WITH DIFFERENTIAL
RBC COUNT CSF: 1 {cells}/uL (ref 0–10)
WBC, CSF: 1 cells/uL (ref 0–5)

## 2017-11-17 LAB — GLUCOSE, CSF: GLUCOSE CSF: 75 mg/dL (ref 40–80)

## 2017-11-17 LAB — PROTEIN, CSF: Total Protein, CSF: 85 mg/dL — ABNORMAL HIGH (ref 15–45)

## 2017-11-17 LAB — CNS IGG SYNTHESIS RATE, CSF+BLOOD
Albumin Serum: 4.1 g/dL (ref 3.2–4.6)
Albumin, CSF: 46.7 mg/dL — ABNORMAL HIGH (ref 8.0–42.0)
CNS-IgG Synthesis Rate: 9 mg/24 h — ABNORMAL HIGH (ref <=3.3)
IgG (Immunoglobin G), Serum: 1150 mg/dL (ref 694–1618)
IgG Total CSF: 8.4 mg/dL — ABNORMAL HIGH (ref 0.8–7.7)
IgG-Index: 0.64 (ref <0.66)

## 2017-11-17 LAB — OLIGOCLONAL BANDS, CSF + SERM

## 2017-11-17 LAB — MYELIN BASIC PROTEIN, CSF: Myelin Basic Protein: <2 mcg/L (ref 2.0–4.0)

## 2017-11-17 LAB — ANGIOTENSIN CONVERTING ENZYME, CSF: ACE, CSF: 4 U/L (ref <=15)

## 2017-11-17 MED FILL — XARELTO 20 MG TABLET: 20 | 30 days supply | Qty: 30 | Fill #0

## 2017-11-17 NOTE — Telephone Encounter (Signed)
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  We discussed management options of IVIG vs. Methylprednisolone and after reviewing pros and cons of each, decided to start with methylprednisolone 1g x 5 days.  Side effects discussed.    Tammy Leaman K. Posey Pronto, DO

## 2017-11-18 ENCOUNTER — Other Ambulatory Visit: Payer: Self-pay | Admitting: *Deleted

## 2017-11-18 DIAGNOSIS — R200 Anesthesia of skin: Secondary | ICD-10-CM

## 2017-11-18 DIAGNOSIS — R202 Paresthesia of skin: Principal | ICD-10-CM

## 2017-11-18 DIAGNOSIS — G6181 Chronic inflammatory demyelinating polyneuritis: Secondary | ICD-10-CM

## 2017-11-18 MED ORDER — NORTRIPTYLINE HCL 25 MG PO CAPS: 25.0000 mg | ORAL_CAPSULE | Freq: Every day | ORAL | 5 refills | Status: DC

## 2017-11-18 NOTE — Telephone Encounter (Signed)
Patient informed that the infusions will start on Monday, 11-23-17 at 9:00.

## 2017-11-19 ENCOUNTER — Telehealth: Payer: Self-pay | Admitting: *Deleted

## 2017-11-19 ENCOUNTER — Telehealth: Payer: Self-pay | Admitting: Internal Medicine

## 2017-11-19 NOTE — Telephone Encounter (Signed)
Patient verified DOB Patient is aware of OSA being mild and needing to implement some changes in weight management and not sleeping in a supine positions. Patient also states she will receive 5 steroid injections beginning Monday. No further questions.

## 2017-11-19 NOTE — Telephone Encounter (Signed)
-----   Message from Tresa Garter, MD sent at 11/13/2017 12:48 PM EST ----- Sleep study showed mild obstructive sleep apnea. There is Insufficent early events to meet protocol requirements for split CPAP titration. Tratment for mild OSA is directed at symptoms. Weight loss and might be sufficient. Positional therapy avoiding supine position during sleep/avoid sleeping on your back. Weight management and regular exercise should be initiated

## 2017-11-19 NOTE — Telephone Encounter (Signed)
Pt called to request her sleep study results. Please follow up

## 2017-11-20 ENCOUNTER — Ambulatory Visit: Payer: BLUE CROSS/BLUE SHIELD | Primary: Family Medicine

## 2017-11-20 ENCOUNTER — Encounter

## 2017-11-22 ENCOUNTER — Inpatient Hospital Stay: Admit: 2017-11-22 | Payer: BLUE CROSS/BLUE SHIELD | Attending: Family Medicine | Primary: Family Medicine

## 2017-11-22 ENCOUNTER — Encounter

## 2017-11-22 DIAGNOSIS — G3184 Mild cognitive impairment, so stated: Secondary | ICD-10-CM

## 2017-11-22 MED ORDER — GADOTERATE MEGLUMINE 0.5 MMOL/ML IV SOLUTION
0.5 mmol/mL (376.9 mg/mL) | Freq: Once | INTRAVENOUS | Status: AC
Start: 2017-11-22 — End: 2017-11-22
  Administered 2017-11-22: 23:00:00 via INTRAVENOUS

## 2017-11-22 MED FILL — DOTAREM 0.5 MMOL/ML (376.9 MG/ML) INTRAVENOUS SOLUTION: 0.5 mmol/mL (376.9 mg/mL) | INTRAVENOUS | Qty: 15

## 2017-11-23 ENCOUNTER — Ambulatory Visit (HOSPITAL_COMMUNITY)
Admission: RE | Admit: 2017-11-23 | Discharge: 2017-11-23 | Disposition: A | Discharge status: Home / Self Care | Payer: Medicaid Other | Source: Ambulatory Visit | Attending: Neurology | Admitting: Neurology

## 2017-11-23 DIAGNOSIS — G6181 Chronic inflammatory demyelinating polyneuritis: Secondary | ICD-10-CM | POA: Diagnosis present

## 2017-11-23 MED ORDER — SODIUM CHLORIDE 0.9 % IV SOLN
1000.0000 mg | Freq: Once | INTRAVENOUS | Status: AC
  Administered 2017-11-23: 1000 mg via INTRAVENOUS
  Filled 2017-11-23: qty 8

## 2017-11-23 MED ORDER — METHYLPREDNISOLONE SODIUM SUCC 1000 MG IJ SOLR
1000.0000 mg | Freq: Every day | INTRAMUSCULAR | Status: DC
  Filled 2017-11-23: qty 8

## 2017-11-23 NOTE — Discharge Instructions (Signed)
Methylprednisolone Solution for Injection What is this medicine? METHYLPREDNISOLONE (meth ill pred NISS oh lone) is a corticosteroid. It is commonly used to treat inflammation of the skin, joints, lungs, and other organs. Common conditions treated include asthma, allergies, and arthritis. It is also used for other conditions, such as blood disorders and diseases of the adrenal glands. This medicine may be used for other purposes; ask your health care provider or pharmacist if you have questions. COMMON BRAND NAME(S): A-Methapred, Solu-Medrol What should I tell my health care provider before I take this medicine? They need to know if you have any of these conditions: -Cushing's syndrome -eye disease, vision problems -diabetes -glaucoma -heart disease -high blood pressure -infection (especially a virus infection such as chickenpox, cold sores, or herpes) -liver disease -mental illness -myasthenia gravis -osteoporosis -recently received or scheduled to receive a vaccine -seizures -stomach or intestine problems -thyroid disease -an unusual or allergic reaction to lactose, methylprednisolone, other medicines, foods, dyes, or preservatives -pregnant or trying to get pregnant -breast-feeding How should I use this medicine? This medicine is for injection or infusion into a vein. It is also for injection into a muscle. It is given by a health care professional in a hospital or clinic setting. Talk to your pediatrician regarding the use of this medicine in children. While this drug may be prescribed for selected conditions, precautions do apply. Overdosage: If you think you have taken too much of this medicine contact a poison control center or emergency room at once. NOTE: This medicine is only for you. Do not share this medicine with others. What if I miss a dose? This does not apply. What may interact with this medicine? Do not take this medicine with any of the following  medications: -alefacept -echinacea -iopamidol -live virus vaccines -metyrapone -mifepristone This medicine may also interact with the following medications: -amphotericin B -aspirin and aspirin-like medicines -certain antibiotics like erythromycin, clarithromycin, troleandomycin -certain medicines for diabetes -certain medicines for fungal infection like ketoconazole -certain medicines for seizures like carbamazepine, phenobarbital, phenytoin -certain medicines that treat or prevent blood clots like warfarin -cyclosporine -digoxin -diuretics -female hormones, like estrogens and birth control pills -isoniazid -NSAIDS, medicines for pain and inflammation, like ibuprofen or naproxen -other medicines for myasthenia gravis -rifampin -vaccines This list may not describe all possible interactions. Give your health care provider a list of all the medicines, herbs, non-prescription drugs, or dietary supplements you use. Also tell them if you smoke, drink alcohol, or use illegal drugs. Some items may interact with your medicine. What should I watch for while using this medicine? Tell your doctor or healthcare professional if your symptoms do not start to get better or if they get worse. Do not stop taking except on your doctor's advice. You may develop a severe reaction. Your doctor will tell you how much medicine to take. Your condition will be monitored carefully while you are receiving this medicine. This medicine may increase your risk of getting an infection. Tell your doctor or health care professional if you are around anyone with measles or chickenpox, or if you develop sores or blisters that do not heal properly. This medicine may affect blood sugar levels. If you have diabetes, check with your doctor or health care professional before you change your diet or the dose of your diabetic medicine. Tell your doctor or health care professional right away if you have any change in your  eyesight. Using this medicine for a long time may increase your risk of low bone  mass. Talk to your doctor about bone health. °What side effects may I notice from receiving this medicine? °Side effects that you should report to your doctor or health care professional as soon as possible: °-allergic reactions like skin rash, itching or hives, swelling of the face, lips, or tongue °-bloody or tarry stools °-changes in vision °-hallucination, loss of contact with reality °-muscle cramps °-muscle pain °-palpitations °-signs and symptoms of high blood sugar such as dizziness; dry mouth; dry skin; fruity breath; nausea; stomach pain; increased hunger or thirst; increased urination °-signs and symptoms of infection like fever or chills; cough; sore throat; pain or trouble passing urine °-trouble passing urine or change in the amount of urine °Side effects that usually do not require medical attention (report to your doctor or health care professional if they continue or are bothersome): °-changes in emotions or mood °-constipation °-diarrhea °-excessive hair growth on the face or body °-headache °-nausea, vomiting °-pain, redness, or irritation at site where injected °-trouble sleeping °-weight gain °This list may not describe all possible side effects. Call your doctor for medical advice about side effects. You may report side effects to FDA at 1-800-FDA-1088. °Where should I keep my medicine? °This drug is given in a hospital or clinic and will not be stored at home. °NOTE: This sheet is a summary. It may not cover all possible information. If you have questions about this medicine, talk to your doctor, pharmacist, or health care provider. °© 2018 Elsevier/Gold Standard (2015-12-27 16:21:28) ° °

## 2017-11-24 ENCOUNTER — Ambulatory Visit (HOSPITAL_COMMUNITY)
Admission: RE | Admit: 2017-11-24 | Discharge: 2017-11-24 | Disposition: A | Discharge status: Home / Self Care | Payer: Self-pay | Source: Ambulatory Visit | Attending: Neurology | Admitting: Neurology

## 2017-11-24 ENCOUNTER — Other Ambulatory Visit (HOSPITAL_COMMUNITY): Payer: Self-pay | Admitting: *Deleted

## 2017-11-24 DIAGNOSIS — G6181 Chronic inflammatory demyelinating polyneuritis: Secondary | ICD-10-CM | POA: Insufficient documentation

## 2017-11-24 MED ORDER — SODIUM CHLORIDE 0.9 % IV SOLN
1000.0000 mg | INTRAVENOUS | Status: DC
  Administered 2017-11-24: 1000 mg via INTRAVENOUS
  Filled 2017-11-24: qty 8

## 2017-11-25 ENCOUNTER — Ambulatory Visit (HOSPITAL_COMMUNITY)
Admission: RE | Admit: 2017-11-25 | Discharge: 2017-11-25 | Disposition: A | Discharge status: Home / Self Care | Payer: Self-pay | Source: Ambulatory Visit | Attending: Neurology | Admitting: Neurology

## 2017-11-25 DIAGNOSIS — G6181 Chronic inflammatory demyelinating polyneuritis: Secondary | ICD-10-CM | POA: Insufficient documentation

## 2017-11-25 MED ORDER — SODIUM CHLORIDE 0.9 % IV SOLN
1000.0000 mg | INTRAVENOUS | Status: DC
  Administered 2017-11-25: 1000 mg via INTRAVENOUS
  Filled 2017-11-25: qty 8

## 2017-11-25 MED FILL — ATORVASTATIN 40 MG TABLET: 40 | 30 days supply | Qty: 30 | Fill #0

## 2017-11-26 ENCOUNTER — Emergency Department (HOSPITAL_COMMUNITY)
Admission: EM | Admit: 2017-11-26 | Discharge: 2017-11-26 | Disposition: A | Discharge status: Home / Self Care | Payer: Medicaid Other | Attending: Emergency Medicine | Admitting: Emergency Medicine

## 2017-11-26 ENCOUNTER — Ambulatory Visit (HOSPITAL_COMMUNITY)
Admission: RE | Admit: 2017-11-26 | Discharge: 2017-11-26 | Disposition: A | Discharge status: Home / Self Care | Payer: Medicaid Other | Source: Ambulatory Visit | Attending: Neurology | Admitting: Neurology

## 2017-11-26 ENCOUNTER — Other Ambulatory Visit: Payer: Self-pay

## 2017-11-26 DIAGNOSIS — Z7901 Long term (current) use of anticoagulants: Secondary | ICD-10-CM | POA: Insufficient documentation

## 2017-11-26 DIAGNOSIS — F411 Generalized anxiety disorder: Secondary | ICD-10-CM | POA: Insufficient documentation

## 2017-11-26 DIAGNOSIS — T8092XA Unspecified transfusion reaction, initial encounter: Secondary | ICD-10-CM | POA: Diagnosis not present

## 2017-11-26 DIAGNOSIS — T801XXA Vascular complications following infusion, transfusion and therapeutic injection, initial encounter: Secondary | ICD-10-CM

## 2017-11-26 DIAGNOSIS — F17210 Nicotine dependence, cigarettes, uncomplicated: Secondary | ICD-10-CM | POA: Diagnosis not present

## 2017-11-26 DIAGNOSIS — Z856 Personal history of leukemia: Secondary | ICD-10-CM | POA: Insufficient documentation

## 2017-11-26 DIAGNOSIS — G6181 Chronic inflammatory demyelinating polyneuritis: Secondary | ICD-10-CM | POA: Diagnosis present

## 2017-11-26 DIAGNOSIS — Z79899 Other long term (current) drug therapy: Secondary | ICD-10-CM | POA: Insufficient documentation

## 2017-11-26 DIAGNOSIS — Y828 Other medical devices associated with adverse incidents: Secondary | ICD-10-CM | POA: Insufficient documentation

## 2017-11-26 MED ORDER — SODIUM CHLORIDE 0.9 % IV SOLN
1000.0000 mg | INTRAVENOUS | Status: DC
  Administered 2017-11-26: 09:00:00 1000 mg via INTRAVENOUS
  Filled 2017-11-26: qty 8

## 2017-11-26 NOTE — Discharge Instructions (Signed)
Apply Ice to the affected area

## 2017-11-26 NOTE — ED Triage Notes (Signed)
Pt came to the hospital to have her IV removed that was placed earlier today. States it was placed on the 2nd floor for a series of steroid shots.

## 2017-11-26 NOTE — ED Provider Notes (Signed)
[facility] EMERGENCY DEPARTMENT Provider Note   CSN: [id number] Arrival date & time: 11/26/17  1925     History   Chief Complaint Chief Complaint  Patient presents with  . IV Removal   HPI   Blood pressure (!) 156/91, pulse (!) 101, temperature 98.3 F (36.8 C), temperature source Oral, resp. rate 18, height 5\' 2"  (1.575 m), weight 85.3 kg (188 lb), SpO2 96 %.  Tammy [name] is a 62 y.o. female testing IV removal.  Patient has been IV Solu-Medrol at the outpatient infusion center.  She was given the option of leaving the IV in today because she will have another infusion in the morning, she opted to leave the IV in but there was bruising around the IV so she came to the ED to have it removed.  Blood thinners and she states that the IVs infiltrate and below easily with her.  She states that her pain is mild, no medication taken prior to arrival.  Past Medical History:  Diagnosis Date  . Chronic headaches   . DVT (deep venous thrombosis) ([place])   . Leukemia ([place])    Told she had it at age 58, given some shots  . Pulmonary embolism ([place])   . Seasonal allergies     Patient Active Problem List   Diagnosis Date Noted  . Neuropathy 06/29/2017  . Right elbow pain 10/30/2016  . Numbness and tingling in left upper extremity 07/03/2016  . Pulmonary embolism without acute cor pulmonale ([place]) 07/03/2016  . Positive ANA (antinuclear antibody) 12/24/2015  . Generalized anxiety disorder 12/24/2015  . Arthralgia of left lower leg 10/22/2015  . Swelling of limb 10/22/2015  . Swelling of left knee joint 10/22/2015  . Abdominal pain, left lower quadrant 03/08/2015  . Colon cancer screening 03/08/2015  . Dyslipidemia 10/09/2014  . Cervical spondylolysis 10/09/2014  . 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 URI (upper respiratory infection) 02/21/2014  . Hemoptysis 02/21/2014  . Pap smear for cervical cancer screening 02/21/2014  . DVT (deep venous thrombosis) ([place])  08/22/2013  . LGSIL (low grade squamous intraepithelial lesion) on Pap smear 08/04/2013  . Acute DVT (deep venous thrombosis) ([place]) 07/08/2013  . Hip pain 07/08/2013  . Sleep apnea 05/21/2011  . Dyspnea 03/11/2011    Past Surgical History:  Procedure Laterality Date  . CESAREAN SECTION     one previous  . right arm fracture    . TUBAL LIGATION      OB History    Gravida Para Term Preterm AB Living   7 4 4  0 3 4   SAB TAB Ectopic Multiple Live Births   2 0 1 0         Home Medications    Prior to Admission medications   Medication Sig Start Date End Date Taking? Authorizing Provider  acetaminophen-codeine (TYLENOL #3) 300-30 MG tablet Take 1 tablet by mouth every 4 (four) hours as needed for moderate pain. 05/13/17   Tresa Garter, MD  atorvastatin (LIPITOR) 40 MG tablet Take 1 tablet (40 mg total) by mouth daily. 05/14/17   Tresa Garter, MD  clobetasol cream (TEMOVATE) 2.44 % Apply 1 application topically 2 (two) times daily. 08/24/17   Tresa Garter, MD  gabapentin (NEURONTIN) 300 MG capsule Take 1 capsule (300 mg total) by mouth 3 (three) times daily. 05/14/17   Tresa Garter, MD  hydrOXYzine (ATARAX/VISTARIL) 25 MG tablet Take 1 tablet (25 mg total) by mouth 3 (three) times daily as  needed for anxiety. 05/14/17   Tresa Garter, MD  nortriptyline (PAMELOR) 25 MG capsule Take 1 capsule (25 mg total) by mouth at bedtime. 11/18/17   Narda Amber K, DO  rivaroxaban (XARELTO) 10 MG TABS tablet Take 1 tablet (10 mg total) by mouth daily. 11/05/17   [name], MD  XARELTO 20 MG TABS tablet Take 1 tablet (20 mg total) by mouth daily. 05/14/17   Tresa Garter, MD    Family History Family History  Problem Relation Age of Onset  . Emphysema Father   . Breast cancer Mother   . Breast cancer Maternal Grandmother   . Heart disease Sister   . Cancer Sister   . Diabetes Brother     Social History Social History   Tobacco Use  . Smoking  status: Light Tobacco Smoker    Packs/day: 0.00    Years: 40.00    Pack years: 0.00    Types: Cigarettes  . Smokeless tobacco: Never Used  . Tobacco comment: Patient reports smoking 2-3 cigarettes a day   Substance Use Topics  . Alcohol use: No  . Drug use: No     Allergies   Patient has no known allergies.   Review of Systems Review of Systems  A complete review of systems was obtained and all systems are negative except as noted in the HPI and PMH.   Physical Exam Updated Vital Signs BP (!) 156/91 (BP Location: Left Arm)   Pulse (!) 101   Temp 98.3 F (36.8 C) (Oral)   Resp 18   Ht 5\' 2"  (1.575 m)   Wt 85.3 kg (188 lb)   SpO2 96%   BMI 34.39 kg/m   Physical Exam  Constitutional: She is oriented to person, place, and time. She appears well-developed and well-nourished. No distress.  HENT:  Head: Normocephalic and atraumatic.  Mouth/Throat: Oropharynx is clear and moist.  Eyes: Conjunctivae and EOM are normal. Pupils are equal, round, and reactive to light.  Neck: Normal range of motion.  Cardiovascular: Normal rate, regular rhythm and intact distal pulses.  Pulmonary/Chest: Effort normal and breath sounds normal.  Abdominal: Soft. There is no tenderness.  Musculoskeletal: Normal range of motion.  Neurological: She is alert and oriented to person, place, and time.  Skin: She is not diaphoretic.  Left forearm with IV be removed by RN, mild ecchymoses approximately 1 x 2 cm, no significant tenderness or warmth.  Distally neurovascularly intact.  Psychiatric: She has a normal mood and affect.  Nursing note and vitals reviewed.    ED Treatments / Results  Labs (all labs ordered are listed, but only abnormal results are displayed) Labs Reviewed - No data to display  EKG  EKG Interpretation None       Radiology No results found.  Procedures Procedures (including critical care time)  Medications Ordered in ED Medications - No data to  display   Initial Impression / Assessment and Plan / ED Course  I have reviewed the triage vital signs and the nursing notes.  Pertinent labs & imaging results that were available during my care of the patient were reviewed by me and considered in my medical decision making (see chart for details).     Vitals:   11/26/17 1940 11/26/17 1948  BP: (!) 156/91   Pulse: (!) 101   Resp: 18   Temp: 98.3 F (36.8 C)   TempSrc: Oral   SpO2: 96%   Weight:  85.3 kg (188 lb)  Height:  5\' 2"  (1.575 m)    Tammy [name] is 62 y.o. female requesting IV removal after infiltrated, she is receiving outpatient infusions, IV removed without complication, advised patient to elevate and apply ice to the affected area.  Evaluation does not show pathology that would require ongoing emergent intervention or inpatient treatment. Pt is hemodynamically stable and mentating appropriately. Discussed findings and plan with patient/guardian, who agrees with care plan. All questions answered. Return precautions discussed and outpatient follow up given.      Final Clinical Impressions(s) / ED Diagnoses   Final diagnoses:  IV infiltration, initial encounter    ED Discharge Orders    None       Karen Kays Charna Elizabeth 11/26/17 Samuella Bruin, MD 12/01/17 1736

## 2017-11-27 ENCOUNTER — Ambulatory Visit (HOSPITAL_COMMUNITY)
Admission: RE | Admit: 2017-11-27 | Discharge: 2017-11-27 | Disposition: A | Discharge status: Home / Self Care | Payer: Medicaid Other | Source: Ambulatory Visit | Attending: Neurology | Admitting: Neurology

## 2017-11-27 DIAGNOSIS — G6181 Chronic inflammatory demyelinating polyneuritis: Secondary | ICD-10-CM | POA: Insufficient documentation

## 2017-11-27 MED ORDER — SODIUM CHLORIDE 0.9 % IV SOLN
1000.0000 mg | INTRAVENOUS | Status: DC
  Administered 2017-11-27: 1000 mg via INTRAVENOUS
  Filled 2017-11-27: qty 8

## 2017-12-16 MED FILL — hydrOXYzine HCL 25 MG TABS: 25 | 10 days supply | Qty: 30 | Fill #1

## 2017-12-16 MED FILL — XARELTO 20 MG TABLET: 20 | 30 days supply | Qty: 30 | Fill #1

## 2017-12-30 ENCOUNTER — Telehealth: Payer: Self-pay | Admitting: Neurology

## 2017-12-30 NOTE — Telephone Encounter (Signed)
Patient called to let Dr. Gwyndolyn Kaufman that the steroid medication is not helping. Please Call. Thanks

## 2017-12-31 NOTE — Telephone Encounter (Signed)
Please advise 

## 2017-12-31 NOTE — Telephone Encounter (Signed)
Patient is coming in on 01-12-18 at 9:30.

## 2017-12-31 NOTE — Telephone Encounter (Signed)
I would like to reassess her in the clinic to see how she is doing and discuss IVIG.  Please if she can come on Monday 3/4 at 11am.

## 2018-01-12 ENCOUNTER — Encounter: Payer: Self-pay | Admitting: Neurology

## 2018-01-12 ENCOUNTER — Ambulatory Visit (INDEPENDENT_AMBULATORY_CARE_PROVIDER_SITE_OTHER): Payer: Self-pay | Admitting: Neurology

## 2018-01-12 VITALS — BP 124/80 | HR 108 | Ht 62.0 in | Wt 190.5 lb

## 2018-01-12 DIAGNOSIS — R292 Abnormal reflex: Secondary | ICD-10-CM

## 2018-01-12 DIAGNOSIS — R252 Cramp and spasm: Secondary | ICD-10-CM

## 2018-01-12 DIAGNOSIS — R29898 Other symptoms and signs involving the musculoskeletal system: Secondary | ICD-10-CM

## 2018-01-12 DIAGNOSIS — G6181 Chronic inflammatory demyelinating polyneuritis: Secondary | ICD-10-CM

## 2018-01-12 MED ORDER — SODIUM CHLORIDE 0.9 % IV SOLN: 1000.0000 mg | INTRAVENOUS | Status: DC

## 2018-01-12 NOTE — Progress Notes (Signed)
Follow-up Visit   Date: 01/12/18    Tammy [name] MRN: [id number] DOB: 10-02-1956   Interim History: Tammy [name] is a 62 y.o. 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  The patient was accompanied to the clinic by self.   History of present illness: She complains of numbness numbness and tingling of the hands which started about a year ago.  It involves her entire palmer side of the hands and both are affected equally. She also has cold sensation of the upper arms.  She has some weakness of her grip, such as holding a phone or opening jars.  Symptoms are worse at night time and often wake her up from sleeping.  She denies any chronic neck pain.  Currently, she takes gabapentin '300mg'$  three times daily and nortriptyline '25mg'$  , which does not provide any relief.  The only thing that provides some relief is applying steelwool on her hands to relieve some of the numbness.  She works as a Aeronautical engineer at [employer].  No prior history of diabetes or thyroid problems.  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 Because of clinical suspicion for CIDP, she had induction dose of IVMP and has noticed some improvement in her burning sensation.  Since then, however, she has new numbness over the right 5th digit. Over the past month, she has noticed painful cramps of the hands, which wakes her up from sleeping.   She is taking nortriptyline '25mg'$  at bedtime which makes her very sleepy.  She takes gabapentin '300mg'$  three times daily, but forgets to take it about twice per week. She denies any pain in the legs, imbalance, or weakness.  Medications:  Current Outpatient Medications on File Prior to Visit  Medication Sig Dispense Refill  . acetaminophen-codeine  (TYLENOL #3) 300-30 MG tablet Take 1 tablet by mouth every 4 (four) hours as needed for moderate pain. 60 tablet 0  . atorvastatin (LIPITOR) 40 MG tablet Take 1 tablet (40 mg total) by mouth daily. 90 tablet 3  . clobetasol cream (TEMOVATE) 7.82 % Apply 1 application topically 2 (two) times daily. 60 g 3  . gabapentin (NEURONTIN) 300 MG capsule Take 1 capsule (300 mg total) by mouth 3 (three) times daily. 90 capsule 3  . hydrOXYzine (ATARAX/VISTARIL) 25 MG tablet Take 1 tablet (25 mg total) by mouth 3 (three) times daily as needed for anxiety. 30 tablet 3  . nortriptyline (PAMELOR) 25 MG capsule Take 1 capsule (25 mg total) by mouth at bedtime. 30 capsule 5  . rivaroxaban (XARELTO) 10 MG TABS tablet Take 1 tablet (10 mg total) by mouth daily. 90 tablet 11   No current facility-administered medications on file prior to visit.     Allergies: No Known Allergies  Review of Systems:  CONSTITUTIONAL: No fevers, chills, night sweats, or weight loss.  EYES: No visual changes or eye pain ENT: No hearing changes.  No history of nose bleeds.   RESPIRATORY: No cough, wheezing and shortness of breath.   CARDIOVASCULAR: Negative for chest pain, and palpitations.   GI: Negative for abdominal discomfort, blood in stools or black stools.  No recent change in bowel habits.   GU:  No history of incontinence.   MUSCLOSKELETAL: No history of joint pain or swelling.  No myalgias.  SKIN: Negative for lesions, rash, and itching.   ENDOCRINE: Negative for cold or heat intolerance, polydipsia or goiter.   PSYCH:  No depression or anxiety symptoms.   NEURO: As Above.   Vital Signs:  BP 124/80   Pulse (!) 108   Ht '5\' 2"'$  (1.575 m)   Wt 190 lb 8 oz (86.4 kg)   SpO2 97%   BMI 34.84 kg/m    General: Well-appearing, comfortable  Neurological Exam: MENTAL STATUS including orientation to time, place, person, recent and remote memory, attention span and concentration, language, and fund of knowledge is normal.   Speech is not dysarthric.  CRANIAL NERVES:  Pupils equal round and reactive to light.  Normal conjugate, extra-ocular eye movements in all directions of gaze.  No ptosis.   Face is symmetric. Palate elevates symmetrically.  Tongue is midline.  MOTOR:  Motor strength is 5/5 in all extremities, except intrinsic hand muscles 4/5.  No atrophy, fasciculations or abnormal movements.  No pronator drift.  Tone is normal.    MSRs:  Reflexes are 2+/4 throughout  SENSORY:  Reduced sensation to pin prick and temperature over the palmer surface of the hands.  COORDINATION/GAIT:  Normal finger-to- nose-finger. Gait narrow based and stable.  Tandem gait intact.   Data: NCS/EMG of the arms 10/15/2017:  The electrophysiologic findings are most consistent with a subacute sensorimotor polyneuropathy, axon loss and demyelinating in type, affecting the upper extremities; these findings are moderate in degree electrically.  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  CSF shows albuminocytologic dissociation on CSF.  In January 2019, she was given induction course of IV methylprednisolone '1mg'$  x 5 days and has not appreciated any marked change.  I will continue IVMP 1g monthly, reassess in 3 months.  If no improvement, will transition to IVIG and will hopefully have medicaid by then as she does not have any insurance at this time.  Muscle cramps are new.  With her relatively brisk upper extremity reflexes, I will check MRI cervical spine wwo contrast for spinal stenosis..  Thiamine deficiency.  She does not drink alcohol. Continue  thiamine '100mg'$ /d  Mild elevation in ACE, likely nonspecific.  CSF ACE is normal.   Return to clinic in 3 months   The duration of this appointment visit was 30 minutes of face-to-face time with the patient.  Greater than 50% of this time was spent in counseling, explanation of diagnosis, planning of further management, and coordination of care.   Thank you for allowing me to participate in patient's care.  If I can answer any additional questions, I would be pleased to do so.    Sincerely,    Shawna Wearing K. Posey Pronto, DO

## 2018-01-12 NOTE — Patient Instructions (Addendum)
MRI cervical spine wwo contrast  Continue monthly steroid infusions  Continue thiamine supplementation  Return to clinic on June 18th at 3:30pm

## 2018-01-13 MED FILL — hydrOXYzine HCL 25 MG TABS: 25 | 10 days supply | Qty: 30 | Fill #2

## 2018-01-13 MED FILL — XARELTO 20 MG TABLET: 20 | 30 days supply | Qty: 30 | Fill #2

## 2018-01-20 ENCOUNTER — Ambulatory Visit (HOSPITAL_COMMUNITY)
Admission: RE | Admit: 2018-01-20 | Discharge: 2018-01-20 | Disposition: A | Discharge status: Home / Self Care | Payer: Medicaid Other | Source: Ambulatory Visit | Attending: Neurology | Admitting: Neurology

## 2018-01-20 ENCOUNTER — Telehealth: Payer: Self-pay | Admitting: *Deleted

## 2018-01-20 DIAGNOSIS — R292 Abnormal reflex: Secondary | ICD-10-CM | POA: Insufficient documentation

## 2018-01-20 DIAGNOSIS — G6181 Chronic inflammatory demyelinating polyneuritis: Secondary | ICD-10-CM

## 2018-01-20 DIAGNOSIS — M4802 Spinal stenosis, cervical region: Secondary | ICD-10-CM | POA: Insufficient documentation

## 2018-01-20 DIAGNOSIS — R29898 Other symptoms and signs involving the musculoskeletal system: Secondary | ICD-10-CM | POA: Insufficient documentation

## 2018-01-20 DIAGNOSIS — R252 Cramp and spasm: Secondary | ICD-10-CM | POA: Insufficient documentation

## 2018-01-20 LAB — POCT I-STAT CREATININE: Creatinine, Ser: 1.1 mg/dL — ABNORMAL HIGH (ref 0.44–1.00)

## 2018-01-20 MED ORDER — GADOBENATE DIMEGLUMINE 529 MG/ML IV SOLN
20.0000 mL | Freq: Once | INTRAVENOUS | Status: AC | PRN
  Administered 2018-01-20: 18 mL via INTRAVENOUS

## 2018-01-20 NOTE — Telephone Encounter (Signed)
Patient given results and instructions.   

## 2018-01-20 NOTE — Telephone Encounter (Signed)
-----   Message from Alda Berthold, DO sent at 01/20/2018 11:56 AM EDT ----- Please let patient know that her MRI cervical spine shows mild age-related changes, nothing severe to cause her hand weakness.  Continue steroid infusions for CIDP.  Thanks.

## 2018-01-26 ENCOUNTER — Other Ambulatory Visit: Payer: Self-pay | Admitting: *Deleted

## 2018-01-26 DIAGNOSIS — G6181 Chronic inflammatory demyelinating polyneuritis: Secondary | ICD-10-CM

## 2018-01-27 ENCOUNTER — Ambulatory Visit (HOSPITAL_COMMUNITY)
Admission: RE | Admit: 2018-01-27 | Discharge: 2018-01-27 | Disposition: A | Discharge status: Home / Self Care | Payer: Medicaid Other | Source: Ambulatory Visit | Attending: Neurology | Admitting: Neurology

## 2018-01-27 DIAGNOSIS — R29898 Other symptoms and signs involving the musculoskeletal system: Secondary | ICD-10-CM | POA: Insufficient documentation

## 2018-01-27 DIAGNOSIS — R252 Cramp and spasm: Secondary | ICD-10-CM | POA: Insufficient documentation

## 2018-01-27 DIAGNOSIS — G6181 Chronic inflammatory demyelinating polyneuritis: Secondary | ICD-10-CM

## 2018-01-27 MED ORDER — METHYLPREDNISOLONE SODIUM SUCC 1000 MG IJ SOLR
1000.0000 mg | Freq: Once | INTRAMUSCULAR | Status: AC
Start: 2018-01-27 — End: 2018-01-27
  Administered 2018-01-27: 09:00:00 1000 mg via INTRAVENOUS
  Filled 2018-01-27: qty 8

## 2018-01-27 MED ORDER — SODIUM CHLORIDE 0.9 % IV SOLN
1000.0000 mg | INTRAVENOUS | Status: DC
  Administered 2018-01-27 (×3): 1000 mg via INTRAVENOUS
  Filled 2018-01-27: qty 8

## 2018-02-01 ENCOUNTER — Encounter

## 2018-02-01 ENCOUNTER — Ambulatory Visit: Admit: 2018-02-01 | Discharge: 2018-02-01 | Payer: PRIVATE HEALTH INSURANCE | Primary: Family Medicine

## 2018-02-01 ENCOUNTER — Ambulatory Visit
Admit: 2018-02-01 | Discharge: 2018-02-01 | Payer: PRIVATE HEALTH INSURANCE | Attending: Neurology | Primary: Family Medicine

## 2018-02-01 ENCOUNTER — Encounter: Attending: Neurology | Primary: Family Medicine

## 2018-02-01 DIAGNOSIS — R413 Other amnesia: Secondary | ICD-10-CM

## 2018-02-01 DIAGNOSIS — R200 Anesthesia of skin: Secondary | ICD-10-CM

## 2018-02-01 NOTE — Procedures (Signed)
Procedures  by Melford Aase, MD at 02/01/18 1000                Author: Melford Aase, MD  Service: --  Author Type: Physician       Filed: 02/01/18 1427  Encounter Date: 02/01/2018  Status: Signed          Editor: Melford Aase, MD (Physician)                     EMG/ NCS Report   [facility]   [address] [place], [address]   [place], [place]  [address]    Ph: [phone]    FAX: [phone]/ [phone]   Test Date:  02/01/2018             Patient:  Vanessa [name]  DOB:  Feb 02, 1956  Physician:  Roxy Manns, MD            Sex:  Female  Height:  ' "  Ref Phys:  Kirstie Mirza, MD            ID#:  [id number]  Weight:   lbs.  Technician:  Danley Danker        Patient History / Exam:   3 yrs pain and numbness in the right shoulder area affecting the right upper extremity and right lower extremity (+) neck pain. (-) weakness      Patient is coming for neuropathy and radiculopathy evaluation.      EMG & NCV Findings:   Evaluation of the right Fibular motor nerve showed normal distal onset latency (3.5 ms), normal amplitude (5.9 mV), normal conduction velocity (B Fib-Ankle, 49 m/s), and normal conduction velocity (Poplt-B Fib, 59 m/s).  The right median motor nerve showed  normal distal onset latency (3.8 ms), normal amplitude (11.8 mV), and normal conduction velocity (Elbow-Wrist, 51 m/s).  The right tibial motor nerve showed normal distal onset latency (3.7 ms), normal amplitude (17.6 mV), and normal conduction velocity  (Knee-Ankle, 46 m/s).  The right ulnar motor nerve showed no response (Med Wrist), normal distal onset latency (2.9 ms), normal amplitude (11.5 mV), normal conduction velocity (B Elbow-Wrist, 56 m/s), and normal conduction velocity (A Elbow-B Elbow, 50  m/s).  The right median sensory, the right radial sensory, the right sural sensory, and the right ulnar sensory nerves showed normal distal peak latency (R3.6, R2.0, R3.4, R3.3 ms) and normal  amplitude (R74.1, R83.7, R19.9, R51.6 ??V).  The right Sup  Fibular sensory nerve showed normal distal peak latency (2.8 ms), normal amplitude (14.6 ??V), and normal conduction velocity (Lower leg-Lat ankle, 53 m/s).  The right median/ulnar (palm) comparison nerve showed normal distal onset latency (Median Palm,  1.7 ms), normal distal peak latency (Median Palm, 2.2 ms), normal amplitude (Median Palm, 76.8 ??V), normal distal onset latency (Ulnar Palm, 1.6 ms), normal distal peak latency (Ulnar Palm, 1.9 ms), and normal amplitude (Ulnar Palm, 11.2 ??V).        All F Wave latencies were within normal limits.        All examined muscles (as indicated in the following table) showed no evidence of electrical instability.           Impression:      Extensive electrodiagnostic examination of the right upper and right lower  extremities, including palmar study, is normal.      Specifically, there is no evidence of a peripheral neuropathy, cervical motor  radiculopathy or lumbosacral  motor radiculopathy on the right.      An incidental finding of normal variant [name]-Gruber anastomosis is noted on the right upper extremity.            Roxy Manns, MD   Diplomate, American Board of Psychiatry and Neurology   Diplomate, Neuromuscular Medicine   Diplomate, American Board of Electrodiagnostic Medicine   Director, [facility] Accredited Laboratory with Exemplary Status            Nerve Conduction Studies   Anti Sensory Summary Table                 Stim Site  NR  Peak (ms)  Norm Peak (ms)  P-T Amp (??V)  Norm P-T Amp  Site1  Site2  Dist (cm)       Right Median Anti Sensory (2nd Digit)  29.9??C               Wrist      3.6  <4  74.1  >13  Wrist  2nd Digit  14.0       Right Radial Anti Sensory (Base 1st Digit)  29.5??C               Wrist      2.0  <2.8  83.7  >11  Wrist  Base 1st Digit  10.0       Right Sup Fibular Anti Sensory (Lat ankle)  29.3??C               Lower leg      2.8  <4.6  14.6  >4  Lower leg  Lat ankle  10.0       Right  Sural Anti Sensory (Lat Mall)  28.8??C               Calf      3.4  <4.5  19.9  >4.0  Calf  Lat Mall  14.0       Right Ulnar Anti Sensory (5th Digit)  29.6??C               Wrist      3.3  <4.0  51.6  >9  Wrist  5th Digit  14.0        Motor Summary Table                    Stim Site  NR  Onset (ms)  Norm Onset (ms)  O-P Amp (mV)  Norm O-P Amp  Amp (Prev) (%)  Site1  Site2  Dist (cm)  Vel (m/s)  Norm Vel (m/s)       Right Fibular Motor (Ext Dig Brev)  28.4??C                  Ankle      3.5  <6.5  5.9  >1.1  100.0  Ankle  Ext Dig Brev  8.0         B Fib      10.3    5.5    93.2  B Fib  Ankle  33.0  49  >38                  Poplt      12.0    5.4    98.2  Poplt  B Fib  10.0  59  >42       Right Median Motor (Abd Poll Brev)  29.3??C  Wrist      3.8  <4.5  11.8  >4.1  100.0  Wrist  Abd Poll Brev  8.0                      Elbow      7.7    9.4    79.7  Elbow  Wrist  20.0  51  >49       Right Tibial Motor (Abd Hall Brev)  27.9??C                  Ankle      3.7  <6.1  17.6  >1.1  100.0  Ankle  Abd Hall Brev  8.0                      Knee      12.1    8.3    47.2  Knee  Ankle  39.0  46  >39       Right Ulnar Motor (Abd Dig Minimi)  22.5??C                  Wrist      2.9  <3.1  11.5  >7.0  100.0  Wrist  Abd Dig Minimi  8.0    >50     B Elbow      6.5    8.1    70.4  B Elbow  Wrist  20.0  56  >50     A Elbow      8.5    7.6    93.8  A Elbow  B Elbow  10.0  50  >50                  Med Elbow      8.8    1.1    14.5  Med Elbow  A Elbow  0.0                      Med Wrist  NR                            Comparison Summary Table                Stim Site  NR  Peak (ms)  P-T Amp (??V)  Site1  Site2  Dist (cm)  Delta-0 (ms)       Right Median/Ulnar Palm Comparison (Wrist)  30.1??C              Median Palm      2.2  74.2  Median Palm  Ulnar Palm  8.0  0.1              Ulnar Palm      1.9  21.2                F Wave Studies             NR  F-Lat (ms)  Lat Norm (ms)  L-R F-Lat (ms)  L-R Lat Norm       Right Tibial (Mrkrs) (Abd  Hallucis)  27.9??C               40.00  <56    <5.7       Right Ulnar (Mrkrs) (Abd Dig Min)  28.8??C  26.51  <32    <2.5        H Reflex Studies            NR  H-Lat (ms)  L-R H-Lat (ms)  L-R Lat Norm       Right Tibial (Gastroc)  27.8??C              40.00    <2.0        EMG                    Side  Muscle  Nerve  Root  Ins Act  Fibs  Psw  Recrt  Duration  Amp  Poly  Comment                  Right  1stDorInt  Ulnar  C8-T1  Nml  Nml  Nml  Nml  Nml  Nml  Nml       Right  ExtIndicis  Radial (Post Int)  C7-8  Nml  Nml  Nml  Nml  Nml  Nml  Nml       Right  Abd Poll Brev  Median  C8-T1  Nml  Nml  Nml  Nml  Nml  Nml  Nml       Right  Biceps  Musculocut  C5-6  Nml  Nml  Nml  Nml  Nml  Nml  Nml       Right  Deltoid  Axillary  C5-6  Nml  Nml  Nml  Nml  Nml  Nml  Nml       Right  Triceps  Radial  C6-7-8  Nml  Nml  Nml  Nml  Nml  Nml  Nml       Right  Ext Dig Brev  Dp Br Peron  L5, S1  Nml  Nml  Nml  Nml  Nml  Nml  Nml       Right  AbdHallucis  MedPlantar  S1-2  Nml  Nml  Nml  Nml  Nml  Nml  Nml       Right  AntTibialis  Dp Br Peron  L4-5  Nml  Nml  Nml  Nml  Nml  Nml  Nml       Right  MedGastroc  Tibial  S1-2  Nml  Nml  Nml  Nml  Nml  Nml  Nml       Right  VastusLat  Femoral  L2-4  Nml  Nml  Nml  Nml  Nml  Nml  Nml       Right  Lower Lumb Parasp  Rami  L5,S1  Nml  Nml  Nml  Nml  Nml  Nml  Nml       Right  Lower Cerv Parasp  Rami  C7,T1  Nml  Nml  Nml  Nml  Nml  Nml  Nml                    Right  GluteusMed  SupGluteal  L4-S1  Nml  Nml  Nml  Nml  Nml  Nml  Nml                        Nerve Conduction Studies   Anti Sensory Left/Right Comparison                    Stim Site  L Lat (ms)  R Lat (ms)  L-R  Lat (ms)  L Amp (??V)  R Amp (??V)  L-R Amp (%)  Site1  Site2  L Vel (m/s)  R Vel (m/s)  L-R Vel (m/s)       Median Anti Sensory (2nd Digit)  29.9??C                  Wrist    2.8      74.1    Wrist  2nd Digit    50         Radial Anti Sensory (Base 1st Digit)  29.5??C                  Wrist    1.5      83.7    Wrist  Base  1st Digit    67         Sup Fibular Anti Sensory (Lat ankle)  29.3??C                  Lower leg    1.9      14.6    Lower leg  Lat ankle    53         Sural Anti Sensory (Lat Mall)  28.8??C                  Calf    2.7      19.9    Calf  Lat Mall    52         Ulnar Anti Sensory (5th Digit)  29.6??C                  Wrist    2.5      51.6    Wrist  5th Digit    56          Motor Left/Right Comparison                    Stim Site  L Lat (ms)  R Lat (ms)  L-R Lat (ms)  L Amp (mV)  R Amp (mV)  L-R Amp (%)  Site1  Site2  L Vel (m/s)  R Vel (m/s)  L-R Vel (m/s)       Fibular Motor (Ext Dig Brev)  28.4??C                  Ankle    3.5      5.9    Ankle  Ext Dig Brev           B Fib    10.3      5.5    B Fib  Ankle    49                    Poplt    12.0      5.4    Poplt  B Fib    59         Median Motor (Abd Poll Brev)  29.3??C                  Wrist    3.8      11.8    Wrist  Abd Poll Brev                        Elbow    7.7      9.4    Elbow  Wrist    51  Tibial Motor (Abd Hall Brev)  27.9??C                  Ankle    3.7      17.6    Ankle  Abd Hall Brev                        Knee    12.1      8.3    Knee  Ankle    46         Ulnar Motor (Abd Dig Minimi)  22.5??C                  Wrist    2.9      11.5    Wrist  Abd Dig Minimi           B Elbow    6.5      8.1    B Elbow  Wrist    56       A Elbow    8.5      7.6    A Elbow  B Elbow    50       Med Elbow    8.8      1.1    Med Elbow  A Elbow                        Med Wrist                              Comparison Left/Right Comparison               Stim Site  L Lat (ms)  R Lat (ms)  L-R Lat (ms)  L Amp (??V)  R Amp (??V)  L-R Amp (%)       Median/Ulnar Palm Comparison (Wrist)  30.1??C             Median Palm    1.7      76.8               Ulnar Palm    1.6      11.2               Waveforms:

## 2018-02-01 NOTE — Procedures (Signed)
EEG:      Date:  02/01/2018    Requesting Physician:  Misty StanleyStacey L. Zacharie Portner, MD    An EEG is requested in this 62 year-old woman to evaluate for epileptiform abnormalities.      Medications:  Medications are listed as Lexapro, Diovan, calcium.      This tracing is obtained during the awake state.  During wakefulness, there are brief intermittent runs of posteriorly-dominant and symmetrical low-to-medium amplitude 9 cycle per second activities which attenuate with eye opening.  Lower-voltage faster-frequency activities are seen symmetrically over the anterior head regions.    Hyperventilation is not performed due to age and medical history.      Intermittent photic stimulation induces symmetric posterior driving responses.      Sleep is not attained.      Interpretation:  This EEG recorded during the awake state is normal.  No epileptiform abnormalities are seen.

## 2018-02-01 NOTE — Procedures (Signed)
EEG:      Date:  02/01/2018    Requesting Physician:  Kojo Liby L. Desiree Fleming, MD    An EEG is requested in this 61 year-old woman to evaluate for epileptiform abnormalities.      Medications:  Medications are listed as Lexapro, Diovan, calcium.      This tracing is obtained during the awake state.  During wakefulness, there are brief intermittent runs of posteriorly-dominant and symmetrical low-to-medium amplitude 9 cycle per second activities which attenuate with eye opening.  Lower-voltage faster-frequency activities are seen symmetrically over the anterior head regions.    Hyperventilation is not performed due to age and medical history.      Intermittent photic stimulation induces symmetric posterior driving responses.      Sleep is not attained.      Interpretation:  This EEG recorded during the awake state is normal.  No epileptiform abnormalities are seen.

## 2018-02-01 NOTE — Progress Notes (Signed)
[facility]    [facility]  [address] [place], [place] [id number]  [address] [place], [place] [id number]   [phone]              No chief complaint on file.    Current Outpatient Medications   Medication Sig Dispense Refill   ??? escitalopram oxalate (LEXAPRO) 10 mg tablet Take 10 mg by mouth daily.     ??? minoxidil (ROGAINE) 2 % external solution Apply  to affected area two (2) times a day. Apply to scalp and rub it in     ??? VAYARIN 75-8.5-21.5 mg cap Take 1 Cap by mouth two (2) times a day.     ??? valsartan-hydrochlorothiazide (DIOVAN-HCT) 160-12.5 mg per tablet Take 1 Tab by mouth daily. 30 Tab 11   ??? CALCIUM-MAGNESIUM-VITAMIN D2 PO Take 1 Packet by mouth daily.        Allergies   Allergen Reactions   ??? Amlodipine Other (comments)     Loss of memory,nervous,jettery   ??? Losartan Other (comments)     Nervous,jettery   ??? Nifedipine Other (comments)     Swelling,palpations     Social History     Tobacco Use   ??? Smoking status: Never Smoker   ??? Smokeless tobacco: Never Used   Substance Use Topics   ??? Alcohol use: No   ??? Drug use: No     Patient comes today for evaluation of what she calls abnormal sensations in the right side.  She previously saw my partner, Dr. Orvis BrillEdmonson, for complaints of memory difficulty.  This is a new complaint and the first time I have seen her.  Patient indicates he has had what she calls stabbing pain throbbing pain in her right upper extremity going on now for about 3 years.  She notes that it starts around the deltoid region and then radiates down into the hand encompassing all the fingers.  She says is constant.  She describes this as a sharp shooting type pain.  She notes that it has become worse over the last several years.  She says that it will also encompass her lower extremity.  She says it is affecting her  work.  She is in so much pain she cannot concentrate.  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  She has not had any injury.  No focal weakness although she does not think she is able to move that side as well secondary to the discomfort.  No fever or chills.  She feels as though she has some anxiety and depression related to all of the discomfort she is in.  No loss of bowel or bladder.  Patient tells me that the use of ice or heat will sometimes help.    Review of notes from Dr. Catha GosselinBanerjee finds she was seen January 25, 2018.  At that time was complaining of right sided pain along the whole right side of her body and noted that she could not use her arm.  Described pins and needle sensation.  Indicated it was elbow the shoulder at that time.  Indicated it was worse at night.    Electronic medical record is reviewed.  She had MRI scan of the brain performed November 22, 2017 is personally reviewed.  She has some nonspecific white matter changes throughout.  MRI of the cervical spine done in November 2017 for complaint of cervical  pain/radiculopathy showed mild C5-C6 and C6-C7 degenerative changes.  That scan was unchanged from a previous one in 2014.  She had an admission to the hospital back in 2013 where she was seen by Dr. Alecia Lemming for muscle contraction headaches depression low back pain and abnormal MRI which was basically just the nonspecific white matter changes.    ROS  Pertinent positives and negatives as noted with remainder of comprehensive review negative    Examination  Visit Vitals  BP 126/86 (BP 1 Location: Left arm, BP Patient Position: Sitting)   Pulse 67   Resp 16   Ht 5\' 7"  (1.702 m)   Wt 70.8 kg (156 lb)   SpO2 98%   BMI 24.43 kg/m??     Awake, alert and oriented.  She has an odd affect.  Her heart is regular.  Her pulses are symmetrical.  Her neck is supple.  I do not hear bruit.  She has no scleral icterus.  Her oropharynx is clear moist.  No icterus.   CN intact 2-12 without nystagmus.  No pronation or drift. Resists fully in all 4 extrems.  DTR symmetric in all 4 extremities.  No ataxia. Steady gait.     Impression/Plan  62 year old woman new to me with new complaint to me with right sided hemibody pain is noted with imaging this been unrevealing.  We will proceed with an EMG of the right upper and lower extremity.  Secondary to the white matter abnormalities on the more I will get a carotid Doppler to evaluate for any potential stenosis to stratify her stroke risk.  EEG as well.  She will follow-up after her testing.    Stanford Scotland, MD      This note was created using voice recognition software. Despite editing, there may be syntax errors. This note will not be viewable in MyChart.

## 2018-02-01 NOTE — Progress Notes (Signed)
EEG Completed

## 2018-02-01 NOTE — Progress Notes (Signed)
EMG/NCS done. See Procedure Note for results.    Jessenia Filippone, MD

## 2018-02-01 NOTE — Progress Notes (Signed)
Chief Complaint   Patient presents with   ??? New Patient   ??? Arm Pain     severe pain going down right side of body from neck all the way down to toes for the past 3 yrs.  Discussed this with pcp, psychiatrist/psychologist

## 2018-02-01 NOTE — Procedures (Signed)
EMG/ NCS Report  [facility] ??? [facility]  [address] [place], [address]  [place], [place]  [address]   Ph: [phone]   FAX: [phone]/ [phone]  Test Date:  02/01/2018    Patient: Vanessa [name] DOB: 1956/05/22 Physician: Roxy Manns, MD   Sex: Female Height: ' " Ref Phys: Kirstie Mirza, MD   ID#: [id number] Weight:  lbs. Technician: Danley Danker     Patient History / Exam:  3 yrs pain and numbness in the right shoulder area affecting the right upper extremity and right lower extremity (+) neck pain. (-) weakness    Patient is coming for neuropathy and radiculopathy evaluation.    EMG & NCV Findings:  Evaluation of the right Fibular motor nerve showed normal distal onset latency (3.5 ms), normal amplitude (5.9 mV), normal conduction velocity (B Fib-Ankle, 49 m/s), and normal conduction velocity (Poplt-B Fib, 59 m/s).  The right median motor nerve showed normal distal onset latency (3.8 ms), normal amplitude (11.8 mV), and normal conduction velocity (Elbow-Wrist, 51 m/s).  The right tibial motor nerve showed normal distal onset latency (3.7 ms), normal amplitude (17.6 mV), and normal conduction velocity (Knee-Ankle, 46 m/s).  The right ulnar motor nerve showed no response (Med Wrist), normal distal onset latency (2.9 ms), normal amplitude (11.5 mV), normal conduction velocity (B Elbow-Wrist, 56 m/s), and normal conduction velocity (A Elbow-B Elbow, 50 m/s).  The right median sensory, the right radial sensory, the right sural sensory, and the right ulnar sensory nerves showed normal distal peak latency (R3.6, R2.0, R3.4, R3.3 ms) and normal amplitude (R74.1, R83.7, R19.9, R51.6 ??V).  The right Sup Fibular sensory nerve showed normal distal peak latency (2.8 ms), normal amplitude (14.6 ??V), and normal conduction velocity (Lower leg-Lat ankle, 53 m/s).  The right median/ulnar (palm) comparison nerve showed normal distal onset  latency (Median Palm, 1.7 ms), normal distal peak latency (Median Palm, 2.2 ms), normal amplitude (Median Palm, 76.8 ??V), normal distal onset latency (Ulnar Palm, 1.6 ms), normal distal peak latency (Ulnar Palm, 1.9 ms), and normal amplitude (Ulnar Palm, 11.2 ??V).      All F Wave latencies were within normal limits.      All examined muscles (as indicated in the following table) showed no evidence of electrical instability.        Impression:    Extensive electrodiagnostic examination of the right upper and right lower extremities, including palmar study, is normal.    Specifically, there is no evidence of a peripheral neuropathy, cervical motor radiculopathy or lumbosacral motor radiculopathy on the right.    An incidental finding of normal variant [name]-Gruber anastomosis is noted on the right upper extremity.        Roxy Manns, MD  Diplomate, American Board of Psychiatry and Neurology  Diplomate, Neuromuscular Medicine  Diplomate, American Board of Electrodiagnostic Medicine  Director, [facility] Accredited Laboratory with Exemplary Status        Nerve Conduction Studies  Anti Sensory Summary Table     Stim Site NR Peak (ms) Norm Peak (ms) P-T Amp (??V) Norm P-T Amp Site1 Site2 Dist (cm)   Right Median Anti Sensory (2nd Digit)  29.9??C   Wrist    3.6 <4 74.1 >13 Wrist 2nd Digit 14.0   Right Radial Anti Sensory (Base 1st Digit)  29.5??C   Wrist    2.0 <2.8 83.7 >11 Wrist Base 1st Digit 10.0   Right Sup Fibular Anti Sensory (Lat ankle)  29.3??C   Lower leg  2.8 <4.6 14.6 >4 Lower leg Lat ankle 10.0   Right Sural Anti Sensory (Lat Mall)  28.8??C   Calf    3.4 <4.5 19.9 >4.0 Calf Lat Mall 14.0   Right Ulnar Anti Sensory (5th Digit)  29.6??C   Wrist    3.3 <4.0 51.6 >9 Wrist 5th Digit 14.0     Motor Summary Table     Stim Site NR Onset (ms) Norm Onset (ms) O-P Amp (mV) Norm O-P Amp Amp (Prev) (%) Site1 Site2 Dist (cm) Vel (m/s) Norm Vel (m/s)   Right Fibular Motor (Ext Dig Brev)  28.4??C    Ankle    3.5 <6.5 5.9 >1.1 100.0 Ankle Ext Dig Brev 8.0     B Fib    10.3  5.5  93.2 B Fib Ankle 33.0 49 >38   Poplt    12.0  5.4  98.2 Poplt B Fib 10.0 59 >42   Right Median Motor (Abd Poll Brev)  29.3??C   Wrist    3.8 <4.5 11.8 >4.1 100.0 Wrist Abd Poll Brev 8.0     Elbow    7.7  9.4  79.7 Elbow Wrist 20.0 51 >49   Right Tibial Motor (Abd Hall Brev)  27.9??C   Ankle    3.7 <6.1 17.6 >1.1 100.0 Ankle Abd Hall Brev 8.0     Knee    12.1  8.3  47.2 Knee Ankle 39.0 46 >39   Right Ulnar Motor (Abd Dig Minimi)  22.5??C   Wrist    2.9 <3.1 11.5 >7.0 100.0 Wrist Abd Dig Minimi 8.0  >50   B Elbow    6.5  8.1  70.4 B Elbow Wrist 20.0 56 >50   A Elbow    8.5  7.6  93.8 A Elbow B Elbow 10.0 50 >50   Med Elbow    8.8  1.1  14.5 Med Elbow A Elbow 0.0     Med Wrist NR               Comparison Summary Table     Stim Site NR Peak (ms) P-T Amp (??V) Site1 Site2 Dist (cm) Delta-0 (ms)   Right Median/Ulnar Palm Comparison (Wrist)  30.1??C   Median Palm    2.2 74.2 Median Palm Ulnar Palm 8.0 0.1   Ulnar Palm    1.9 21.2         F Wave Studies     NR F-Lat (ms) Lat Norm (ms) L-R F-Lat (ms) L-R Lat Norm   Right Tibial (Mrkrs) (Abd Hallucis)  27.9??C      40.00 <56  <5.7   Right Ulnar (Mrkrs) (Abd Dig Min)  28.8??C      26.51 <32  <2.5     H Reflex Studies     NR H-Lat (ms) L-R H-Lat (ms) L-R Lat Norm   Right Tibial (Gastroc)  27.8??C      40.00  <2.0     EMG     Side Muscle Nerve Root Ins Act Fibs Psw Recrt Duration Amp Poly Comment   Right 1stDorInt Ulnar C8-T1 Nml Nml Nml Nml Nml Nml Nml    Right ExtIndicis Radial (Post Int) C7-8 Nml Nml Nml Nml Nml Nml Nml    Right Abd Poll Brev Median C8-T1 Nml Nml Nml Nml Nml Nml Nml    Right Biceps Musculocut C5-6 Nml Nml Nml Nml Nml Nml Nml    Right Deltoid Axillary C5-6 Nml Nml Nml Nml Nml Nml Nml  Right Triceps Radial C6-7-8 Nml Nml Nml Nml Nml Nml Nml    Right Ext Dig Brev Dp Br Peron L5, S1 Nml Nml Nml Nml Nml Nml Nml    Right AbdHallucis MedPlantar S1-2 Nml Nml Nml Nml Nml Nml Nml     Right AntTibialis Dp Br Peron L4-5 Nml Nml Nml Nml Nml Nml Nml    Right MedGastroc Tibial S1-2 Nml Nml Nml Nml Nml Nml Nml    Right VastusLat Femoral L2-4 Nml Nml Nml Nml Nml Nml Nml    Right Lower Lumb Parasp Rami L5,S1 Nml Nml Nml Nml Nml Nml Nml    Right Lower Cerv Parasp Rami C7,T1 Nml Nml Nml Nml Nml Nml Nml    Right GluteusMed SupGluteal L4-S1 Nml Nml Nml Nml Nml Nml Nml                Nerve Conduction Studies  Anti Sensory Left/Right Comparison     Stim Site L Lat (ms) R Lat (ms) L-R Lat (ms) L Amp (??V) R Amp (??V) L-R Amp (%) Site1 Site2 L Vel (m/s) R Vel (m/s) L-R Vel (m/s)   Median Anti Sensory (2nd Digit)  29.9??C   Wrist  2.8   74.1  Wrist 2nd Digit  50    Radial Anti Sensory (Base 1st Digit)  29.5??C   Wrist  1.5   83.7  Wrist Base 1st Digit  67    Sup Fibular Anti Sensory (Lat ankle)  29.3??C   Lower leg  1.9   14.6  Lower leg Lat ankle  53    Sural Anti Sensory (Lat Mall)  28.8??C   Calf  2.7   19.9  Calf Lat Mall  52    Ulnar Anti Sensory (5th Digit)  29.6??C   Wrist  2.5   51.6  Wrist 5th Digit  56      Motor Left/Right Comparison     Stim Site L Lat (ms) R Lat (ms) L-R Lat (ms) L Amp (mV) R Amp (mV) L-R Amp (%) Site1 Site2 L Vel (m/s) R Vel (m/s) L-R Vel (m/s)   Fibular Motor (Ext Dig Brev)  28.4??C   Ankle  3.5   5.9  Ankle Ext Dig Brev      B Fib  10.3   5.5  B Fib Ankle  49    Poplt  12.0   5.4  Poplt B Fib  59    Median Motor (Abd Poll Brev)  29.3??C   Wrist  3.8   11.8  Wrist Abd Poll Brev      Elbow  7.7   9.4  Elbow Wrist  51    Tibial Motor (Abd Hall Brev)  27.9??C   Ankle  3.7   17.6  Ankle Abd Hall Brev      Knee  12.1   8.3  Knee Ankle  46    Ulnar Motor (Abd Dig Minimi)  22.5??C   Wrist  2.9   11.5  Wrist Abd Dig Minimi      B Elbow  6.5   8.1  B Elbow Wrist  56    A Elbow  8.5   7.6  A Elbow B Elbow  50    Med Elbow  8.8   1.1  Med Elbow A Elbow      Med Wrist                Comparison Left/Right Comparison     Stim Site L Lat (ms)  R Lat (ms) L-R Lat (ms) L Amp (??V) R Amp (??V) L-R Amp (%)    Median/Ulnar Palm Comparison (Wrist)  30.1??C   Median Palm  1.7   76.8    Ulnar Palm  1.6   11.2          Waveforms:

## 2018-02-02 NOTE — Telephone Encounter (Signed)
Patient called stated that she is having a lot of pressure on her chest. And that she feels really drained. Patient states that she has a mark on her right arm. Patient had a emg on right side

## 2018-02-02 NOTE — Telephone Encounter (Signed)
LVM to call office, if pt having chest pain, may need to go to ED

## 2018-02-03 NOTE — Telephone Encounter (Signed)
Pt states she was having pain on right side and pounding HA since EMG/EEG.  Explained to pt that these tests should not have any bearing on sx and if they worsen or new sx appear to call office.  Pt d/t f/u with Sean on 02/09/18 to discuss results.

## 2018-02-03 NOTE — Telephone Encounter (Signed)
-----   Message from Elease EtienneKaren I Harris sent at 02/02/2018  3:47 PM EDT -----  Regarding: NP Olaughlin/telephone  Pt (p) [phone], Patient was returning the nurses call.

## 2018-02-09 ENCOUNTER — Ambulatory Visit
Admit: 2018-02-09 | Discharge: 2018-02-09 | Payer: PRIVATE HEALTH INSURANCE | Attending: Family | Primary: Family Medicine

## 2018-02-09 ENCOUNTER — Encounter: Attending: Family | Primary: Family Medicine

## 2018-02-09 DIAGNOSIS — M79609 Pain in unspecified limb: Secondary | ICD-10-CM

## 2018-02-09 MED ORDER — GABAPENTIN 100 MG CAP
100 mg | ORAL_CAPSULE | Freq: Three times a day (TID) | ORAL | 5 refills | Status: DC
Start: 2018-02-09 — End: 2018-06-21

## 2018-02-09 NOTE — Progress Notes (Signed)
Vanessa [name] is a 62 y.o. female who presents with the following  Chief Complaint   Patient presents with   ??? Numbness     right side.        HPI     Patient comes in for a follow up for EMG, EEG and carotid doppler to evaluate right sided numbness.   She states the symptoms have been ongoing for about 3 years now and are getting worse as of recent.   No real changes in health, medication history, etc.   She feels like she will get sharp nerve pains all throughout her body at random times.   Her sister has fibromyalgia.       Allergies   Allergen Reactions   ??? Amlodipine Other (comments)     Loss of memory,nervous,jettery   ??? Losartan Other (comments)     Nervous,jettery   ??? Nifedipine Other (comments)     Swelling,palpations       Current Outpatient Medications   Medication Sig   ??? gabapentin (NEURONTIN) 100 mg capsule Take 1 Cap by mouth three (3) times daily.   ??? CHOLECALCIFEROL, VITAMIN D3, PO Take 5,000 Int'l Units/day by mouth.   ??? meloxicam (MOBIC) 15 mg tablet Take 15 mg by mouth.   ??? buPROPion (WELLBUTRIN) 75 mg tablet TAKE 1 TABLET BY MOUTH TWICE DAILY PLEASE TAKE THE SECOND DOSE AT NOON.   ??? losartan-hydroCHLOROthiazide (HYZAAR) 100-25 mg per tablet Take 1 Tab by mouth daily.   ??? escitalopram oxalate (LEXAPRO) 10 mg tablet Take 10 mg by mouth daily.   ??? minoxidil (ROGAINE) 2 % external solution Apply  to affected area two (2) times a day. Apply to scalp and rub it in   ??? VAYARIN 75-8.5-21.5 mg cap Take 1 Cap by mouth two (2) times a day.     No current facility-administered medications for this visit.        Social History     Tobacco Use   Smoking Status Never Smoker   Smokeless Tobacco Never Used       Past Medical History:   Diagnosis Date   ??? Depression    ??? H/O colonoscopy 09-24-12    diverticulosis   ??? Hepatic steatosis    ??? Hypertension    ??? 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 Normal cardiac stress test 02-10-12   ??? Pneumonia        Past Surgical History:   Procedure Laterality Date   ??? HX GYN      tubal ligation       Family History   Problem Relation Age of Onset   ??? Heart Disease Mother    ??? Heart Disease Father        Social History     Socioeconomic History   ??? Marital status: MARRIED     Spouse name: Not on file   ??? Number of children: Not on file   ??? Years of education: Not on file   ??? Highest education level: Not on file   Tobacco Use   ??? Smoking status: Never Smoker   ??? Smokeless tobacco: Never Used   Substance and Sexual Activity   ??? Alcohol use: No   ??? Drug use: No   ??? Sexual activity: Yes     Partners: Male     Birth control/protection: None       Review of Systems  Eyes: Negative for blurred vision, double vision and photophobia.   Neurological: Positive for tingling and sensory change. Negative for seizures and loss of consciousness.   Psychiatric/Behavioral: Positive for memory loss.       Remainder of comprehensive review is negative.     Physical Exam :    Visit Vitals  BP 124/82   Ht 5' 7"  (1.702 m)   Wt 70.8 kg (156 lb)   BMI 24.43 kg/m??               Results for orders placed or performed in visit on 10/30/16   URINALYSIS W/ RFLX MICROSCOPIC   Result Value Ref Range    Specific Gravity 1.019 1.005 - 1.030    pH (UA) 6.5 5.0 - 7.5    Color Yellow Yellow    Appearance Cloudy (A) Clear    Leukocyte Esterase 2+ (A) Negative    Protein Negative Negative/Trace    Glucose Negative Negative    Ketone Negative Negative    Blood Negative Negative    Bilirubin Negative Negative    Urobilinogen 0.2 0.2 - 1.0 mg/dL    Nitrites Negative Negative    Microscopic Examination See additional order    CBC WITH AUTOMATED DIFF   Result Value Ref Range    WBC 5.6 3.4 - 10.8 x10E3/uL    RBC 4.91 3.77 - 5.28 x10E6/uL    HGB 15.9 11.1 - 15.9 g/dL    HCT 46.8 (H) 34.0 - 46.6 %    MCV 95 79 - 97 fL    MCH 32.4 26.6 - 33.0 pg    MCHC 34.0 31.5 - 35.7 g/dL    RDW 13.5 12.3 - 15.4 %     PLATELET 309 150 - 379 x10E3/uL    NEUTROPHILS 56 Not Estab. %    Lymphocytes 30 Not Estab. %    MONOCYTES 10 Not Estab. %    EOSINOPHILS 3 Not Estab. %    BASOPHILS 1 Not Estab. %    ABS. NEUTROPHILS 3.2 1.4 - 7.0 x10E3/uL    Abs Lymphocytes 1.7 0.7 - 3.1 x10E3/uL    ABS. MONOCYTES 0.5 0.1 - 0.9 x10E3/uL    ABS. EOSINOPHILS 0.2 0.0 - 0.4 x10E3/uL    ABS. BASOPHILS 0.0 0.0 - 0.2 x10E3/uL    IMMATURE GRANULOCYTES 0 Not Estab. %    ABS. IMM. GRANS. 0.0 0.0 - 0.1 D66Y4/IH   METABOLIC PANEL, COMPREHENSIVE   Result Value Ref Range    Glucose 80 65 - 99 mg/dL    BUN 15 6 - 24 mg/dL    Creatinine 1.01 (H) 0.57 - 1.00 mg/dL    GFR est non-AA 61 >59 mL/min/1.73    GFR est AA 70 >59 mL/min/1.73    BUN/Creatinine ratio 15 9 - 23    Sodium 141 134 - 144 mmol/L    Potassium 4.1 3.5 - 5.2 mmol/L    Chloride 100 96 - 106 mmol/L    CO2 23 18 - 29 mmol/L    Calcium 10.1 8.7 - 10.2 mg/dL    Protein, total 7.7 6.0 - 8.5 g/dL    Albumin 4.5 3.5 - 5.5 g/dL    GLOBULIN, TOTAL 3.2 1.5 - 4.5 g/dL    A-G Ratio 1.4 1.2 - 2.2    Bilirubin, total 0.4 0.0 - 1.2 mg/dL    Alk. phosphatase 78 39 - 117 IU/L    AST (SGOT) 38 0 - 40 IU/L    ALT (SGPT) 55 (H) 0 -  32 IU/L   LIPID PANEL   Result Value Ref Range    Cholesterol, total 249 (H) 100 - 199 mg/dL    Triglyceride 86 0 - 149 mg/dL    HDL Cholesterol 88 >39 mg/dL    VLDL, calculated 17 5 - 40 mg/dL    LDL, calculated 144 (H) 0 - 99 mg/dL   TSH 3RD GENERATION   Result Value Ref Range    TSH 1.320 0.450 - 4.500 uIU/mL   HEMOGLOBIN A1C WITH EAG   Result Value Ref Range    Hemoglobin A1c 5.3 4.8 - 5.6 %    Estimated average glucose 105 mg/dL   [facility] AND [facility]   Result Value Ref Range    Luteinizing hormone 40.7 mIU/mL    FSH 80.4 mIU/mL   MICROSCOPIC EXAMINATION   Result Value Ref Range    WBC 6-10 (A) 0 - 5 /hpf    RBC 0-2 0 - 2 /hpf    Epithelial cells >10 (A) 0 - 10 /hpf    Casts None seen None seen /lpf    Mucus Present Not Estab.    Bacteria Few None seen/Few       Orders Placed This Encounter    ??? gabapentin (NEURONTIN) 100 mg capsule     Sig: Take 1 Cap by mouth three (3) times daily.     Dispense:  90 Cap     Refill:  5       No diagnosis found.    Follow-up and Dispositions    ?? Return in about 2 months (around 04/11/2018).         EMG, EEG and doppler discussed in full.   We discussed her symptoms further. Can try low dose Gabapentin 100 mg up to TID to help with symptomatic management. She feels maybe like fibromyalgia pains and this could be causing her memory fog.   Will re-evaluate after trying this. Defers PT right now for her neck and arm pains.   No other questions.         This note will not be viewable in MyChart

## 2018-02-09 NOTE — Progress Notes (Signed)
Patient here for results.

## 2018-02-23 ENCOUNTER — Other Ambulatory Visit: Payer: Self-pay | Admitting: *Deleted

## 2018-02-23 ENCOUNTER — Other Ambulatory Visit (HOSPITAL_COMMUNITY): Payer: Self-pay | Admitting: *Deleted

## 2018-02-23 DIAGNOSIS — G6181 Chronic inflammatory demyelinating polyneuritis: Secondary | ICD-10-CM

## 2018-02-24 ENCOUNTER — Telehealth: Payer: Self-pay | Admitting: Neurology

## 2018-02-24 ENCOUNTER — Ambulatory Visit (HOSPITAL_COMMUNITY)
Admission: RE | Admit: 2018-02-24 | Discharge: 2018-02-24 | Disposition: A | Discharge status: Home / Self Care | Payer: Self-pay | Source: Ambulatory Visit | Attending: Neurology | Admitting: Neurology

## 2018-02-24 DIAGNOSIS — G6181 Chronic inflammatory demyelinating polyneuritis: Secondary | ICD-10-CM | POA: Insufficient documentation

## 2018-02-24 MED ORDER — METHYLPREDNISOLONE SODIUM SUCC 1000 MG IJ SOLR
1000.0000 mg | Freq: Once | INTRAMUSCULAR | Status: AC
  Administered 2018-02-24: 10:00:00 1000 mg via INTRAVENOUS
  Filled 2018-02-24: qty 8

## 2018-02-24 NOTE — Telephone Encounter (Signed)
Last note says that if no improvement with solumedrol, we may start IVIG.  Please advise since patient states that she has not improved.

## 2018-02-24 NOTE — Telephone Encounter (Signed)
Patient lmom regarding 3 more steroid shots? She wants to confirm? Please Call. Thanks

## 2018-02-25 NOTE — Telephone Encounter (Signed)
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

## 2018-02-26 NOTE — Telephone Encounter (Signed)
Patient notified.  She still has no insurance.  Medicaid will start next month.  Sent Asencion Partridge a message for advice.

## 2018-03-03 MED FILL — XARELTO 20 MG TABLET: 20 | 30 days supply | Qty: 30 | Fill #3

## 2018-03-03 MED FILL — hydrOXYzine HCL 25 MG TABS: 25 | 10 days supply | Qty: 30 | Fill #3

## 2018-03-05 ENCOUNTER — Telehealth

## 2018-03-05 NOTE — Telephone Encounter (Signed)
-----   Message from Lorre Nick sent at 03/04/2018  2:28 PM EDT -----  Regarding: Dr.Epps/Telephone  Pt called requesting a call back in regards to scheduling an appt to come in to discuss disability options . Pt best contact number is [phone] .

## 2018-03-08 NOTE — Telephone Encounter (Signed)
Referral placed for Dr. Wynonia Hazard, pt notified that Ashawntae will call her to schedule.

## 2018-03-08 NOTE — Telephone Encounter (Signed)
Pt states she was advised by HR with her employer to apply for disability as she is not able to perform job duties to their standards d/t poor ST memory.  She has been with her employer for 35 yrs but is not of age to rec'd pension yet.  She wanted to know if Dr. Jettie Pagan would be willing to discuss options with her or recommend disability attorney

## 2018-04-09 NOTE — Telephone Encounter (Signed)
-----   Message from Ellamae SiaAndre Burnett sent at 04/08/2018  3:17 PM EDT -----  Regarding: Dr. Michail SermonEpps/Telephone  Pt request for a call back in regards to documentation being sent to the pt's specialist. She is advising that it has not been sent yet. Best contact number is [phone].

## 2018-04-13 NOTE — Telephone Encounter (Addendum)
-----

## 2018-04-13 NOTE — Telephone Encounter (Signed)
-----   Message from Elease EtienneKaren I Harris sent at 04/13/2018 10:16 AM EDT -----  Regarding: Dr Michail SermonEpps/telephone  Pt [phone], f/u on request from last week for copies of her progress notes, the copes of test were received, they just need the progress notes , the fax number was  left in previous messages , this is due by  June 23rd for her Disability company

## 2018-04-14 ENCOUNTER — Encounter: Payer: Self-pay | Admitting: Neurology

## 2018-04-14 ENCOUNTER — Ambulatory Visit (INDEPENDENT_AMBULATORY_CARE_PROVIDER_SITE_OTHER): Payer: Self-pay | Admitting: Neurology

## 2018-04-14 VITALS — BP 110/80 | HR 88 | Ht 62.0 in | Wt 187.2 lb

## 2018-04-14 DIAGNOSIS — G6181 Chronic inflammatory demyelinating polyneuritis: Secondary | ICD-10-CM

## 2018-04-14 DIAGNOSIS — R252 Cramp and spasm: Secondary | ICD-10-CM

## 2018-04-14 DIAGNOSIS — R202 Paresthesia of skin: Secondary | ICD-10-CM

## 2018-04-14 DIAGNOSIS — R200 Anesthesia of skin: Secondary | ICD-10-CM

## 2018-04-14 DIAGNOSIS — M5410 Radiculopathy, site unspecified: Secondary | ICD-10-CM

## 2018-04-14 MED ORDER — GABAPENTIN 300 MG PO CAPS: ORAL_CAPSULE | ORAL | 11 refills | Status: DC

## 2018-04-14 MED ORDER — NORTRIPTYLINE HCL 25 MG PO CAPS: 25.0000 mg | ORAL_CAPSULE | Freq: Every day | ORAL | 11 refills | Status: DC

## 2018-04-14 NOTE — Patient Instructions (Addendum)
Take gabapentin 300mg  in the morning, 300mg  afternoon, and 600mg  at bedtime  Continue nortriptyline 25mg  at bedtime  Start monthly steroid infusions  Please call once your medicaid is approved  Start taking daily calcium supplement 800-1200mg  daily  Return to clinic in 4 months

## 2018-04-14 NOTE — Progress Notes (Signed)
Follow-up Visit   Date: 04/14/18    Tammy [name] MRN: [id number] DOB: 1956/09/08   Interim History: Tammy [name] is a 62 y.o. 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  The patient was accompanied to the clinic by self.   History of present illness: She complains of numbness numbness and tingling of the hands which started about a year ago.  It involves her entire palmer side of the hands and both are affected equally. She also has cold sensation of the upper arms.  She has some weakness of her grip, such as holding a phone or opening jars.  Symptoms are worse at night time and often wake her up from sleeping.  She denies any chronic neck pain.  Currently, she takes gabapentin 37m three times daily and nortriptyline 289m, which does not provide any relief.  The only thing that provides some relief is applying steelwool on her hands to relieve some of the numbness.  She works as a frAeronautical engineert a [employer] No prior history of diabetes or thyroid problems.  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 Because of clinical suspicion for CIDP, she had induction dose of IVMP and has noticed some improvement in her burning sensation.  Since then, however, she has new numbness over the right 5th digit. Over the past month, she has noticed painful cramps of the hands, which wakes her up from sleeping.   She is taking nortriptyline 2530mt bedtime which makes her very sleepy.  She takes gabapentin 300m53mree times daily, but forgets to take it about twice per week. She denies any pain in the legs, imbalance, or weakness.  UPDATE 04/14/2018:  She is here for follow-up visit.  She has not been able to get medicaid, so did not get IVIG.  We have been  in contact with home infusion services who is waiting for medicaid number to process paperwork for IVIG.  Bilateral arms pain has been getting worse and her arm spasms/cramps are worse.  She denies any weakness or numbness/tingling of the legs.  She takes gabapentin 300mg53m and nortriptyline 25mg 82medtime with little relief.    Medications:  Current Outpatient Medications on File Prior to Visit  Medication Sig Dispense Refill  . acetaminophen-codeine (TYLENOL #3) 300-30 MG tablet Take 1 tablet by mouth every 4 (four) hours as needed for moderate pain. 60 tablet 0  . atorvastatin (LIPITOR) 40 MG tablet Take 1 tablet (40 mg total) by mouth daily. 90 tablet 3  . clobetasol cream (TEMOVATE) 0.05 %5.69ly 1 application topically 2 (two) times daily. 60 g 3  . hydrOXYzine (ATARAX/VISTARIL) 25 MG tablet Take 1 tablet (25 mg total) by mouth 3 (three) times daily as needed for anxiety. 30 tablet 3  . rivaroxaban (XARELTO) 10 MG TABS tablet Take 1 tablet (10 mg total) by mouth daily. 90 tablet 11   Current Facility-Administered Medications on File Prior to Visit  Medication Dose Route Frequency Provider Last Rate Last Dose  . methylPREDNISolone sodium succinate (SOLU-MEDROL) 1,000 mg in sodium chloride 0.9 % 50 mL IVPB  1,000 mg Intravenous Q28 days Patel,Alda Berthold8 mL/hr at 01/27/18 0928 1,000 mg at 01/27/18 0928  7948lergies: No Known Allergies  Review of Systems:  CONSTITUTIONAL: No fevers, chills, night sweats, or weight loss.  EYES: No visual changes or eye pain ENT: No hearing changes.  No history of nose bleeds.   RESPIRATORY: No cough, wheezing and shortness of breath.   CARDIOVASCULAR: Negative for chest pain, and palpitations.   GI: Negative for abdominal discomfort, blood in stools or black stools.  No recent change in bowel habits.   GU:  No history of incontinence.   MUSCLOSKELETAL: No history of joint pain or swelling.  No myalgias.   SKIN: Negative for lesions, rash, and  itching.   ENDOCRINE: Negative for cold or heat intolerance, polydipsia or goiter.   PSYCH:  No depression or anxiety symptoms.   NEURO: As Above.   Vital Signs:  BP 110/80   Pulse 88   Ht 5' 2" (1.575 m)   Wt 187 lb 4 oz (84.9 kg)   SpO2 97%   BMI 34.25 kg/m   General Medical Exam:   General:  Well appearing, comfortable  Eyes/ENT: see cranial nerve examination.   Neck: No masses appreciated.  Full range of motion without tenderness.  No carotid bruits. Respiratory:  Clear to auscultation, good air entry bilaterally.   Cardiac:  Regular rate and rhythm, no murmur.   Ext:  No edema  Neurological Exam: MENTAL STATUS including orientation to time, place, person, recent and remote memory, attention span and concentration, language, and fund of knowledge is normal.  Speech is not dysarthric.  CRANIAL NERVES:  Pupils equal round and reactive to light.  Normal conjugate, extra-ocular eye movements in all directions of gaze.  No ptosis.   Face is symmetric. Palate elevates symmetrically.  Tongue is midline.  MOTOR:  Motor strength is 5/5 in all extremities, except intrinsic hand muscles 4/5.  No atrophy, fasciculations or abnormal movements.  No pronator drift.  Tone is normal.    MSRs:  Reflexes are 2+/4 throughout  SENSORY:  Reduced sensation to pin prick and temperature over the palmer surface of the hands.  COORDINATION/GAIT:  Normal finger-to- nose-finger. Gait narrow based and stable.  Tandem gait intact.   Data: NCS/EMG of the arms 10/15/2017:  The electrophysiologic findings are most consistent with a subacute sensorimotor polyneuropathy, axon loss and demyelinating in type, affecting the upper extremities; these findings are moderate in degree electrically.  NCS/EMG of the legs 10/29/2017:  Normal  CSF 11/10/2017:  R1 W1 G75 P85*    IgG 0.64, MBP < 2, OCB + CSF and serum, cytology negative, ACE 4  Labs 10/29/2017:  ESR 72*, CRP <0.3, vitamin B12 434, vitamin B1 7*, copper  153, folate 15.6, MMA 122, ANA 1:80 (speckled), SPEP with IFE no M protein, ACE 77*, heavy metal screen  MRI cervical spine 01/20/2018: 1. Stable mild right foraminal narrowing at C6-7. 2. Slight progression of uncovertebral disease and mild right foraminal narrowing at C5-6. 3. No focal cord signal abnormality or thickening of nerve roots.   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  CSF shows albuminocytologic dissociation on CSF.  In January 2019, she was given induction course of IV methylprednisolone 27m x 5 days and has not appreciated any marked change. She has not been getting IVMP since her last visit, hoping that she would get approved for IVIG; however, she has applied for medicaid which is pending.  As we wait for her medicaid to come through, I will restart monthly IVMP 1g. Adjust gabapentin to 3012min the morning, 30082mfternoon, and 600m22m bedtime Continue nortriptyline 25mg87m  at bedtime  Thiamine deficiency.  She does not drink alcohol. Continue thiamine 141m/d  Return to clinic in 4 months  Greater than 50% of this 25 minute visit was spent in counseling, explanation of diagnosis, planning of further management, and coordination of care.    Thank you for allowing me to participate in patient's care.  If I can answer any additional questions, I would be pleased to do so.    Sincerely,    Donika K. PPosey Pronto DO

## 2018-04-14 NOTE — Telephone Encounter (Signed)
Attempted to contact patient. Left message on voicemail for return call.

## 2018-04-15 NOTE — Telephone Encounter (Signed)
I had spoke with the patient this week about the medical records that she had requested a few times now. Patient stated that the records company [employer] that we use didn't send all of the records. Patient was pretty upset and requested the office to take care of it right away.  I have faxed the records to dr. Jason Nestbushra shahs office

## 2018-04-16 ENCOUNTER — Ambulatory Visit: Admit: 2018-04-16 | Payer: PRIVATE HEALTH INSURANCE | Attending: Clinical Neuropsychologist | Primary: Family Medicine

## 2018-04-16 DIAGNOSIS — G3184 Mild cognitive impairment, so stated: Secondary | ICD-10-CM

## 2018-04-16 NOTE — Progress Notes (Signed)
[facility]  [facility]   [address]   [place], [place] [id number]   [phone] Office   [phone] Fax      Neuropsychology    Initial Diagnostic Interview Note      Referral:  Vanessa [name], [name], MD, Dr. Mora BellmanEpps    Vanessa [name] is a 62 y.o. right handed married African American female who was unaccompanied to the initial clinical interview on 04/16/18 .  Please refer to her medical records for details pertaining to her history.  Briefly, the patient reported that she completed two years of college without history of previously diagnosed LD and/or receipt of special education services.  Stopped working June 3 and she has applied for long term disability.  She has been dealing with a progressive decline in short term memory.  Forgets the content of conversations. Misplaces things. Has to write everything down.  Starts tasks and does not complete. Loses words.  Loses train of thought.  Loses two pairs of glasses and three diamond rings in the past two months.  Slower to learn and comprehend new things.  No history of stroke, meningitis/encephalitis, RMS Fever, Lupus, Lyme, TBI, sz.  She gets headaches when she is really tired.  Her mother had dementia and was showing memory problems in her 5260s and died at age 61.  Patient has not noticed no major changes in her sense of taste or smell.  She has missed exits when driving and avoids driving at night or with heavy traffic.  No accidents or near misses.  She is fairly independent for medications but keeps notes, and same with finances.  She is independent for her ADLs.  Sleep is poor, and has been poor for quite some time.  She does get frequent headaches and is tired a lot which makes the headaches worse, too. Appetite is variable, but has increased.  She has been on different medications for depression and anxiety for a number of years now and is currently on Wellbutrin and  Lexapro.  She is seeing Dr. Roddie Mchomas-Wilson for psychotherapy and continues to engage in same.  She has noticed no major improvements there and cognition is declining.  However, the medications do help with panic attacks, which are less frequent now.  She has having a lot more panic attacks when working (2-3x a week).  No falls but loses her balance on occasion.  She is engaged in church activities.  Marriage is going well and family notices the cognitive decline and are noted.      I also reviewed job performance reviews and improvement plans and cognitive issues are noted here and are noted as ongoing and worsening despite attempts and understanding and moves towards improved job performance.      Date: ??02/01/2018    Requesting Physician: ??Vanessa L. Epps, MD    An EEG is requested in this 62 year-old woman to evaluate for   epileptiform abnormalities. ??    Medications: ??Medications are listed as Lexapro, Diovan, calcium.   ??    This tracing is obtained during the awake state. ??During   wakefulness, there are brief intermittent runs of   posteriorly-dominant and symmetrical low-to-medium amplitude 9   cycle per second activities which attenuate with eye opening. ??  Lower-voltage faster-frequency activities are seen symmetrically   over the anterior head regions.    Hyperventilation is not performed due to age and medical history.   ??    Intermittent  photic stimulation induces symmetric posterior   driving responses. ??    Sleep is not attained. ??    Interpretation: ??This EEG recorded during the awake state is   normal. ??No epileptiform abnormalities are seen.       Result Information     Status: Final result (Exam End: 11/22/2017 17:12) Provider Status: Open   Study Result     EXAM:  MRI BRAIN W WO CONT  INDICATION:  mild cognitive impairment, G 31.84, memory loss, blackouts, sharp  pain in shoulders and needles in arms and legs worse on right side.  TECHNIQUE: Sagittal and axial T1 weighted images were obtained  followed by  intravenous infusion 13 mL Dotarem and axial FLAIR and T-2 weighted images.  Delayed axial and coronal postgadolinium T1-weighted images were obtained as  well as sagittal FLAIR images and axial diffusion weighted images.  COMPARISON: MRI of the head 04/08/13, CT 02/16/13  FINDINGS:  The ventricular size and configuration are normal.   Scattered small foci of T2 hyperintensity again demonstrated in the cerebral  white matter most prominent in the left frontal lobe without significant change  since the MRI 04/08/13. No associated abnormal enhancement or restricted  diffusion.  There is no evidence of intracranial hemorrhage, infarct, mass or abnormal  extra-axial fluid collections.   Flow voids are present in the vertebral, basilar and carotid artery systems.    The craniocervical junction is unremarkable.   The other structures of the cranial base including paranasal sinuses are   unremarkable.  ??  IMPRESSION  IMPRESSION:   1. Chronic nonspecific foci of T2 hyperintensity cerebral white matter without  significant change since 04/08/13.  2. No acute intracranial abnormality demonstrated..  ??         Neuropsych reviewed from 2018.   This is a lengthy report and history reported is consistent with history reported here.  Concerns for MCI with mood issues raised and now concern is for a dementia versus pseudodementia.      Neuropsychological Mental Status Exam (NMSE):  Historian: Good  Praxis: No UE apraxia  R/L Orientation: Intact to self and to other  Dress: within normal limits   Weight: within normal limits   Appearance/Hygiene: within normal limits   Gait: within normal limits   Assistive Devices: Glasses  Mood: Mildly depressed  Affect: Mildly flat  Comprehension: within normal limits   Thought Process: within normal limits   Expressive Language: within normal limits   Receptive Language: within normal limits   Motor:  No cognitive or motor perseveration  ETOH: Denied  Tobacco: Denied  Illicit:  Denied  SI/HI: Denied current, but when things were really bad a year or so ago she had passive thoughts.  No plan, intent, or attempts.  She has been previously psychiat[facility].    Psychosis: Denied  Insight: Within normal limits  Judgment: Within normal limits  Other Psych:      Past Medical History:   Diagnosis Date   ??? Depression    ??? H/O colonoscopy 09-24-12    diverticulosis   ??? Hepatic steatosis    ??? Hypertension    ??? 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 Normal cardiac stress test 02-10-12   ??? Pneumonia        Past Surgical History:   Procedure Laterality Date   ??? HX GYN      tubal ligation  Allergies   Allergen Reactions   ??? Amlodipine Other (comments)     Loss of memory,nervous,jettery   ??? Losartan Other (comments)     Nervous,jettery   ??? Nifedipine Other (comments)     Swelling,palpations       Family History   Problem Relation Age of Onset   ??? Heart Disease Mother    ??? Heart Disease Father        Social History     Tobacco Use   ??? Smoking status: Never Smoker   ??? Smokeless tobacco: Never Used   Substance Use Topics   ??? Alcohol use: No   ??? Drug use: No       Current Outpatient Medications   Medication Sig Dispense Refill   ??? gabapentin (NEURONTIN) 100 mg capsule Take 1 Cap by mouth three (3) times daily. 90 Cap 5   ??? CHOLECALCIFEROL, VITAMIN D3, PO Take 5,000 Int'l Units/day by mouth.     ??? meloxicam (MOBIC) 15 mg tablet Take 15 mg by mouth.     ??? buPROPion (WELLBUTRIN) 75 mg tablet TAKE 1 TABLET BY MOUTH TWICE DAILY PLEASE TAKE THE SECOND DOSE AT NOON.  2   ??? losartan-hydroCHLOROthiazide (HYZAAR) 100-25 mg per tablet Take 1 Tab by mouth daily.     ??? escitalopram oxalate (LEXAPRO) 10 mg tablet Take 10 mg by mouth daily.     ??? minoxidil (ROGAINE) 2 % external solution Apply  to affected area two (2) times a day. Apply to scalp and rub it in     ??? VAYARIN 75-8.5-21.5 mg cap Take 1 Cap by  mouth two (2) times a day.           Plan:  Obtain authorization for testing from insurance company.  Report to follow once testing, scoring, and interpretation completed.  ? Organic based neurocognitive issues versus mood disorder or combination of same.  ? Problems organic, functional, or both? This note will not be viewable in MyChart.      [id number]*9 1 hour 50 mintes spent reviewing extensive records across systems here and time with patient.

## 2018-04-16 NOTE — Progress Notes (Signed)
[facility]  [facility]  NEUROLOGY CLINIC   [address]   [place], [place] [address]   [phone] Office   [phone] Fax      Neuropsychology    Initial Diagnostic Interview Note      Referral:  Katherina Right, MD, Dr. Mora Bellman Vanessa [name] is a 62 y.o. right handed married African American female who was unaccompanied to the initial clinical interview on 04/16/18 .  Please refer to her medical records for details pertaining to her history.  Briefly, the patient reported that she completed two years of college without history of previously diagnosed LD and/or receipt of special education services.  Stopped working June 3 and she has applied for long term disability.  She has been dealing with a progressive decline in short term memory.  Forgets the content of conversations. Misplaces things. Has to write everything down.  Starts tasks and does not complete. Loses words.  Loses train of thought.  Loses two pairs of glasses and three diamond rings in the past two months.  Slower to learn and comprehend new things.  No history of stroke, meningitis/encephalitis, RMS Fever, Lupus, Lyme, TBI, sz.  She gets headaches when she is really tired.  Her mother had dementia and was showing memory problems in her 69s and died at age 69.  Patient has not noticed no major changes in her sense of taste or smell.  She has missed exits when driving and avoids driving at night or with heavy traffic.  No accidents or near misses.  She is fairly independent for medications but keeps notes, and same with finances.  She is independent for her ADLs.  Sleep is poor, and has been poor for quite some time.  She does get frequent headaches and is tired a lot which makes the headaches worse, too. Appetite is variable, but has increased.  She has been on different medications for depression and anxiety for a number  of years now and is currently on Wellbutrin and Lexapro.  She is seeing Dr. Roddie Mc for psychotherapy and continues to engage in same.  She has noticed no major improvements there and cognition is declining.  However, the medications do help with panic attacks, which are less frequent now.  She has having a lot more panic attacks when working (2-3x a week).  No falls but loses her balance on occasion.  She is engaged in church activities.  Marriage is going well and family notices the cognitive decline and are noted.      I also reviewed job performance reviews and improvement plans and cognitive issues are noted here and are noted as ongoing and worsening despite attempts and understanding and moves towards improved job performance.      Date: ??02/01/2018    Requesting Physician: ??Stacey L. Epps, MD    An EEG is requested in this 62 year-old woman to evaluate for   epileptiform abnormalities. ??    Medications: ??Medications are listed as Lexapro, Diovan, calcium.   ??    This tracing is obtained during the awake state. ??During   wakefulness, there are brief intermittent runs of   posteriorly-dominant and symmetrical low-to-medium amplitude 9   cycle per second activities which attenuate with eye opening. ??  Lower-voltage faster-frequency activities are seen symmetrically   over the anterior head regions.    Hyperventilation is not performed due to age and medical history.   ??    Intermittent  photic stimulation induces symmetric posterior   driving responses. ??    Sleep is not attained. ??    Interpretation: ??This EEG recorded during the awake state is   normal. ??No epileptiform abnormalities are seen.       Result Information     Status: Final result (Exam End: 11/22/2017 17:12) Provider Status: Open   Study Result     EXAM:  MRI BRAIN W WO CONT  INDICATION:  mild cognitive impairment, G 31.84, memory loss, blackouts, sharp  pain in shoulders and needles in arms and legs worse on right side.   TECHNIQUE: Sagittal and axial T1 weighted images were obtained followed by  intravenous infusion 13 mL Dotarem and axial FLAIR and T-2 weighted images.  Delayed axial and coronal postgadolinium T1-weighted images were obtained as  well as sagittal FLAIR images and axial diffusion weighted images.  COMPARISON: MRI of the head 04/08/13, CT 02/16/13  FINDINGS:  The ventricular size and configuration are normal.   Scattered small foci of T2 hyperintensity again demonstrated in the cerebral  white matter most prominent in the left frontal lobe without significant change  since the MRI 04/08/13. No associated abnormal enhancement or restricted  diffusion.  There is no evidence of intracranial hemorrhage, infarct, mass or abnormal  extra-axial fluid collections.   Flow voids are present in the vertebral, basilar and carotid artery systems.    The craniocervical junction is unremarkable.   The other structures of the cranial base including paranasal sinuses are   unremarkable.  ??  IMPRESSION  IMPRESSION:   1. Chronic nonspecific foci of T2 hyperintensity cerebral white matter without  significant change since 04/08/13.  2. No acute intracranial abnormality demonstrated..  ??         Neuropsych reviewed from 2018.   This is a lengthy report and history reported is consistent with history reported here.  Concerns for MCI with mood issues raised and now concern is for a dementia versus pseudodementia.      Neuropsychological Mental Status Exam (NMSE):  Historian: Good  Praxis: No UE apraxia  R/L Orientation: Intact to self and to other  Dress: within normal limits   Weight: within normal limits   Appearance/Hygiene: within normal limits   Gait: within normal limits   Assistive Devices: Glasses  Mood: Mildly depressed  Affect: Mildly flat  Comprehension: within normal limits   Thought Process: within normal limits   Expressive Language: within normal limits   Receptive Language: within normal limits    Motor:  No cognitive or motor perseveration  ETOH: Denied  Tobacco: Denied  Illicit: Denied  SI/HI: Denied current, but when things were really bad a year or so ago she had passive thoughts.  No plan, intent, or attempts.  She has been previously psychiatrically [facility].    Psychosis: Denied  Insight: Within normal limits  Judgment: Within normal limits  Other Psych:      Past Medical History:   Diagnosis Date   ??? Depression    ??? H/O colonoscopy 09-24-12    diverticulosis   ??? Hepatic steatosis    ??? Hypertension    ??? 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 Normal cardiac stress test 02-10-12   ??? Pneumonia        Past Surgical History:   Procedure Laterality Date   ??? HX GYN      tubal ligation  Allergies   Allergen Reactions   ??? Amlodipine Other (comments)     Loss of memory,nervous,jettery   ??? Losartan Other (comments)     Nervous,jettery   ??? Nifedipine Other (comments)     Swelling,palpations       Family History   Problem Relation Age of Onset   ??? Heart Disease Mother    ??? Heart Disease Father        Social History     Tobacco Use   ??? Smoking status: Never Smoker   ??? Smokeless tobacco: Never Used   Substance Use Topics   ??? Alcohol use: No   ??? Drug use: No       Current Outpatient Medications   Medication Sig Dispense Refill   ??? gabapentin (NEURONTIN) 100 mg capsule Take 1 Cap by mouth three (3) times daily. 90 Cap 5   ??? CHOLECALCIFEROL, VITAMIN D3, PO Take 5,000 Int'l Units/day by mouth.     ??? meloxicam (MOBIC) 15 mg tablet Take 15 mg by mouth.     ??? buPROPion (WELLBUTRIN) 75 mg tablet TAKE 1 TABLET BY MOUTH TWICE DAILY PLEASE TAKE THE SECOND DOSE AT NOON.  2   ??? losartan-hydroCHLOROthiazide (HYZAAR) 100-25 mg per tablet Take 1 Tab by mouth daily.     ??? escitalopram oxalate (LEXAPRO) 10 mg tablet Take 10 mg by mouth daily.     ??? minoxidil (ROGAINE) 2 % external solution Apply  to affected area two  (2) times a day. Apply to scalp and rub it in     ??? VAYARIN 75-8.5-21.5 mg cap Take 1 Cap by mouth two (2) times a day.           Plan:  Obtain authorization for testing from insurance company.  Report to follow once testing, scoring, and interpretation completed.  ? Organic based neurocognitive issues versus mood disorder or combination of same.  ? Problems organic, functional, or both? This note will not be viewable in MyChart.      [id number] 1 hour 50 mintes spent reviewing extensive records across systems here and time with patient.

## 2018-04-19 ENCOUNTER — Encounter: Attending: Neurology | Primary: Family Medicine

## 2018-04-20 ENCOUNTER — Ambulatory Visit: Payer: Medicaid Other | Admitting: Neurology

## 2018-04-22 ENCOUNTER — Ambulatory Visit: Attending: Clinical Neuropsychologist | Primary: Family Medicine

## 2018-04-22 ENCOUNTER — Ambulatory Visit
Admit: 2018-04-22 | Discharge: 2018-04-22 | Payer: PRIVATE HEALTH INSURANCE | Attending: Clinical Neuropsychologist | Primary: Family Medicine

## 2018-04-22 ENCOUNTER — Ambulatory Visit (HOSPITAL_COMMUNITY)
Admission: RE | Admit: 2018-04-22 | Discharge: 2018-04-22 | Disposition: A | Discharge status: Home / Self Care | Payer: Medicaid Other | Source: Ambulatory Visit | Attending: Neurology | Admitting: Neurology

## 2018-04-22 DIAGNOSIS — G3184 Mild cognitive impairment, so stated: Secondary | ICD-10-CM

## 2018-04-22 DIAGNOSIS — G6181 Chronic inflammatory demyelinating polyneuritis: Secondary | ICD-10-CM | POA: Diagnosis present

## 2018-04-22 MED ORDER — METHYLPREDNISOLONE SODIUM SUCC 1000 MG IJ SOLR
1000.0000 mg | Freq: Once | INTRAMUSCULAR | Status: AC
  Administered 2018-04-22: 1000 mg via INTRAVENOUS
  Filled 2018-04-22: qty 8

## 2018-04-22 MED ORDER — METHYLPREDNISOLONE SODIUM SUCC 1000 MG IJ SOLR
1000.0000 mg | INTRAMUSCULAR | Status: DC
  Filled 2018-04-22: qty 8

## 2018-04-22 NOTE — Progress Notes (Signed)
[facility]  [facility]  NEUROLOGY CLINIC   [address] [place] [address]   [place], [place] [address]   [phone] Office   [phone] Fax      Neuropsychological Evaluation Report    Referral:  Katherina Right, MD, Dr. Mora Bellman Vanessa [name] is a 62 y.o. right handed married African American female who was unaccompanied to the initial clinical interview on 04/16/18 .  Please refer to her medical records for details pertaining to her history.  Briefly, the patient reported that she completed two years of college without history of previously diagnosed LD and/or receipt of special education services.  Stopped working June 3 and she has applied for long term disability.  She has been dealing with a progressive decline in short term memory.  Forgets the content of conversations. Misplaces things. Has to write everything down.  Starts tasks and does not complete. Loses words.  Loses train of thought.  Loses two pairs of glasses and three diamond rings in the past two months.  Slower to learn and comprehend new things.  No history of stroke, meningitis/encephalitis, RMS Fever, Lupus, Lyme, TBI, sz.  She gets headaches when she is really tired.  Her mother had dementia and was showing memory problems in her 61s and died at age 73.  Patient has not noticed no major changes in her sense of taste or smell.  She has missed exits when driving and avoids driving at night or with heavy traffic.  No accidents or near misses.  She is fairly independent for medications but keeps notes, and same with finances.  She is independent for her ADLs.  Sleep is poor, and has been poor for quite some time.  She does get frequent headaches and is tired a lot which makes the headaches worse, too. Appetite is variable, but has increased.  She has been on different medications for depression and anxiety for a number of years now and is currently on Wellbutrin and Lexapro.  She is  seeing Dr. Roddie Mc for psychotherapy and continues to engage in same.  She has noticed no major improvements there and cognition is declining.  However, the medications do help with panic attacks, which are less frequent now.  She has having a lot more panic attacks when working (2-3x a week).  No falls but loses her balance on occasion.  She is engaged in church activities.  Marriage is going well and family notices the cognitive decline and are noted.      I also reviewed job performance reviews and improvement plans and cognitive issues are noted here and are noted as ongoing and worsening despite attempts and understanding and moves towards improved job performance.      Date: ??02/01/2018    Requesting Physician: ??Stacey L. Epps, MD    An EEG is requested in this 62 year-old woman to evaluate for   epileptiform abnormalities. ??    Medications: ??Medications are listed as Lexapro, Diovan, calcium.   ??    This tracing is obtained during the awake state. ??During   wakefulness, there are brief intermittent runs of   posteriorly-dominant and symmetrical low-to-medium amplitude 9   cycle per second activities which attenuate with eye opening. ??  Lower-voltage faster-frequency activities are seen symmetrically   over the anterior head regions.    Hyperventilation is not performed due to age and medical history.   ??    Intermittent photic stimulation induces symmetric posterior  driving responses. ??    Sleep is not attained. ??    Interpretation: ??This EEG recorded during the awake state is   normal. ??No epileptiform abnormalities are seen.       Result Information     Status: Final result (Exam End: 11/22/2017 17:12) Provider Status: Open   Study Result     EXAM:  MRI BRAIN W WO CONT  INDICATION:  mild cognitive impairment, G 31.84, memory loss, blackouts, sharp  pain in shoulders and needles in arms and legs worse on right side.  TECHNIQUE: Sagittal and axial T1 weighted images were obtained followed  by  intravenous infusion 13 mL Dotarem and axial FLAIR and T-2 weighted images.  Delayed axial and coronal postgadolinium T1-weighted images were obtained as  well as sagittal FLAIR images and axial diffusion weighted images.  COMPARISON: MRI of the head 04/08/13, CT 02/16/13  FINDINGS:  The ventricular size and configuration are normal.   Scattered small foci of T2 hyperintensity again demonstrated in the cerebral  white matter most prominent in the left frontal lobe without significant change  since the MRI 04/08/13. No associated abnormal enhancement or restricted  diffusion.  There is no evidence of intracranial hemorrhage, infarct, mass or abnormal  extra-axial fluid collections.   Flow voids are present in the vertebral, basilar and carotid artery systems.    The craniocervical junction is unremarkable.   The other structures of the cranial base including paranasal sinuses are   unremarkable.  ??  IMPRESSION  IMPRESSION:   1. Chronic nonspecific foci of T2 hyperintensity cerebral white matter without  significant change since 04/08/13.  2. No acute intracranial abnormality demonstrated..  ??         Neuropsych reviewed from 2018.   This is a lengthy report and history reported is consistent with history reported here.  Concerns for MCI with mood issues raised and now concern is for a dementia versus pseudodementia.      Neuropsychological Mental Status Exam (NMSE):  Historian: Good  Praxis: No UE apraxia  R/L Orientation: Intact to self and to other  Dress: within normal limits   Weight: within normal limits   Appearance/Hygiene: within normal limits   Gait: within normal limits   Assistive Devices: Glasses  Mood: Mildly depressed  Affect: Mildly flat  Comprehension: within normal limits   Thought Process: within normal limits   Expressive Language: within normal limits   Receptive Language: within normal limits   Motor:  No cognitive or motor perseveration  ETOH: Denied  Tobacco: Denied  Illicit: Denied  SI/HI: Denied  current, but when things were really bad a year or so ago she had passive thoughts.  No plan, intent, or attempts.  She has been previously psychiatrically hospitalized at [facility].    Psychosis: Denied  Insight: Within normal limits  Judgment: Within normal limits  Other Psych:      Past Medical History:   Diagnosis Date   ??? Depression    ??? H/O colonoscopy 09-24-12    diverticulosis   ??? Hepatic steatosis    ??? Hypertension    ??? 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 Normal cardiac stress test 02-10-12   ??? Pneumonia        Past Surgical History:   Procedure Laterality Date   ??? HX GYN      tubal ligation       Allergies   Allergen  Reactions   ??? Amlodipine Other (comments)     Loss of memory,nervous,jettery   ??? Losartan Other (comments)     Nervous,jettery   ??? Nifedipine Other (comments)     Swelling,palpations       Family History   Problem Relation Age of Onset   ??? Heart Disease Mother    ??? Heart Disease Father        Social History     Tobacco Use   ??? Smoking status: Never Smoker   ??? Smokeless tobacco: Never Used   Substance Use Topics   ??? Alcohol use: No   ??? Drug use: No       Current Outpatient Medications   Medication Sig Dispense Refill   ??? gabapentin (NEURONTIN) 100 mg capsule Take 1 Cap by mouth three (3) times daily. 90 Cap 5   ??? CHOLECALCIFEROL, VITAMIN D3, PO Take 5,000 Int'l Units/day by mouth.     ??? meloxicam (MOBIC) 15 mg tablet Take 15 mg by mouth.     ??? buPROPion (WELLBUTRIN) 75 mg tablet TAKE 1 TABLET BY MOUTH TWICE DAILY PLEASE TAKE THE SECOND DOSE AT NOON.  2   ??? losartan-hydroCHLOROthiazide (HYZAAR) 100-25 mg per tablet Take 1 Tab by mouth daily.     ??? escitalopram oxalate (LEXAPRO) 10 mg tablet Take 10 mg by mouth daily.     ??? minoxidil (ROGAINE) 2 % external solution Apply  to affected area two (2) times a day. Apply to scalp and rub it in     ??? VAYARIN 75-8.5-21.5 mg cap Take 1 Cap by mouth two (2) times a  day.           Plan:  Obtain authorization for testing from insurance company.  Report to follow once testing, scoring, and interpretation completed.  ? Organic based neurocognitive issues versus mood disorder or combination of same.  ? Problems organic, functional, or both? This note will not be viewable in MyChart.      [id number]*6 [id number]*9 1 hour 50 mintes spent reviewing extensive records across systems here and time with patient.     Neuropsychological Evaluation  Patient Testing 04/22/18 Report Completed 04/23/18  A Psychometrist Assisted w/ portions of this evaluation while under my direct supervision    Please refer to the patient's initial interview progress note (copied above) and her medical records for details pertaining to her history.  Today's neuropsychological test scores follow:    The following assessment procedures were performed:      Neuropsychologist Performed, Interpreted, & Reported: Neuropsychological Mental Status Exam, Revised Memory & Behavior Checklist, Mini Mental State Exam, Clock Drawing Test, Test Of Premorbid Functioning, McGill-Melzack Pain Questionnaire,  History Taking  & Clinical Interview With The Patient, PAI, CPT, Review Of Available Records.    Psychometrist Administered & Neuropsychologist Interpreted & Neuropsychologist Reported: Chief Financial Officer, Grooved Pegboard Test, [employer], Wechsler Adult Intelligence Scale ??? IV, Verbal Fluency Tests, [employer] ??? Revised, Trailmaking Test Parts A & B, [place] Verbal Learning Test ??? 3, Rey Complex Figure Test, Beck Depression Inventory ??? II, Beck Anxiety Inventory.      Test Findings:  Note:  The patient???s raw data have been compared with currently available norms which include demographic corrections for age, gender, and/or education.  Sometimes, the patient???s scores are compared to demographically similar individuals as close to the patient???s age, education level, etc., as possible.  "Average" is viewed as being  +/- 1 standard deviation (SD) from the stated mean for  a particular test score.  "Low average" is viewed as being between 1 and 2 SD below the mean, and above average is viewed as being 1 and 2 SD above the mean.  Scores falling in the ???borderline??? range (between 1-1/2 and 2 SD below the mean) are viewed with particular attention as to whether they are normal or abnormal neurocognitive test scores.  Other methods of inference in analyzing the test data are also utilized, including the pattern and range of scores in the profile, bilateral motor functions, and the presence, if any, of pathognomonic signs.      Behaviorally, the patient was friendly and cooperative and appeared motivated to perform well during this examination.  Within this context, the results of this evaluation are viewed as a valid reflection of the patient???s actual neurocognitive and emotional status.       Her structured word list fluency, as assessed by the FAS Test, was within the below average range with a T score of 42.  Category fluency was within the below average range with a T score of 41  Confrontation naming ability, as assessed by the [facility] Test ??? Revised, was within the average range at 52/60 correct (T = 52).   This pattern of performance is not indicative of a patient who is at increased risk for day-to-day problems with verbal fluency and confrontation naming ability.       The patient was administered the [place]ners??? Continuous Performance Test ??? III, a computer-administered test of sustained attention, and review of the subscales within this instrument revealed moderate to severe concern for inattentiveness with additional concern for impulsivity.  .  Nonverbal auditory attention and discrimination, as assessed by the [facility] Rhythm Test (T = 33) was mildly to moderately impaired.  This pattern of performance is  indicative of a patient who is at increased risk for day-to-day problems with sustained visual  attention/concentration and nonverbal auditory attention.       The patient was administered the Wechsler Adult Intelligence Scale ??? IV.  See Appendix I for full breakdown of IQ test scores (scanned into media section of this EMR).  As can be seen, there was no clinically significant difference between her borderline range Working Memory Index score of 71 (3rd %ile) and her borderline range Processing Speed Index score of 76 (5th %ile).  Her   Verbal Comprehension Index score of 83 (13th %ile) was within the low average range.  Her Perceptual Reasoning Index score of 88 (21st %ile) was within the low average range.  This pattern of performance is  indicative of a patient who is at increased risk for day-to-day problems with working memory and processing speed, and those scores are lower than expected based on her performance on a task estimating premorbid functioning levels.       The patient was administered the [place] Verbal Learning Test  - 3 and generated a normal range and positive learning curve over five repeated auditory word list learning trials.  An interference trial was within normal limits.  Free and cued, short and long delayed recall were within the normal range.  Recognition and forced choice recall were within normal limits.  This pattern of performance is not indicative of a patient who is at increased risk for day-to-day problems with auditory learning and/or memory.       The patient???s performance on the copy portion of the Rey Complex Figure Test was within normal limits  (11-16th %ile).  Recall for the complex  design was within the normal range after a short delay (10th %ile) and low after a long delay (4th %ile).   Recognition recall was impaired (<1st %ile).   This pattern of performance is indicative of a patient who is at increased risk for day-to-day problems with visual delayed memory.       Simple timed visual motor sequencing (Trailmaking Test Part A) was within the average range  with a T score of 45.  Her performance on a similar, but more complex task of timed visual motor sequencing (Trailmaking Test Part B) was within the below average range with a T score of 42.  She made zero sequencing errors on this latter test.  Taken together, this pattern of performance is not indicative of a patient who is at increased risk for day-to-day problems with executive functioning.       Motor speed for finger tapping was within the mildly impaired range for her dominant hand (T = 36) and mildly to moderately impaired for her nondominant hand (T = 34).  Fine motor dexterity was mildly impaired bilaterally. Neurologic correlation is indicated.       The patient rated her current level of pain as "1/5- Mild" on the McGill-Melzack Pain Questionnaire.  She reported pain in her upper back, arms, lower back, knees, and right foot.       Marland Kitchen. Her Beck Depression Inventory ???II score of 31 was within the severely depressed range.  Her Beck Anxiety Inventory score of 29 reflected severe anxiety.       The patient was also administered the Personality Assessment Inventory and generated ain invalid profile for further interpretation due to a high level of patient response inconsistencies.       I also reviewed a Neuropsych evaluation by another provider completed in 2017.  Results from that evaluation also indicated mild cognitive issues involving mostly attention and memory. Comparison with previous testing shows a mild trend of decline in visual memory over time and a more significant decline in attention over time.  She was reporting significant depression, anxiety, and PTSD on valid range personality testing with the PAI.  The current PAI is deemed invalid due to problems with item comprehension and inconsistent responses over time.       Impressions & Recommendations:  This patient has now undergone two Neuropsychological Evaluations (previously by another provider in 2017 and the incident evaluation with me).  She was previously diagnosed with Mild Cognitive Impairment and there was significant psychiatric overlay.  The current test results indicate a mild DECLINE in cognitive functioning over time, with significant problems noted in terms of severe deficits in attention (previously moderate) and significant (moderate to severe) decline in visual memory.  Interestingly, her auditory memory is fully normal, which leads away from a dementia type diagnosis.  She is also showing borderline range problems with working memory and processing speed, as well as problems with bilateral motor skills.  Executive functioning, perceptual reasoning, verbal comprehension, verbal fluency, and confrontation naming remain normal.  From an emotional standpoint, she reports severe depression and severe anxiety on two brief self-report questionnaires.  Lengthier assessment of personality functioning could not be further interpreted due to item confusion/comprehension and consistency problems.  She previously generated a valid PAI which showed depression, anxiety, and PTSD.  Ongoing PTSD issues are likely.     While the current profile not consistent with dementia, there is sufficient evidence to suggest a worsening mild cognitive disorder with significant psychiatric overlay. Consideration for  psychiatric medication management for marked attention problems is suggested, along with psychiatric treatment and intensive psychotherapy for severe mood problems.  Consider EMDR.  At this point, her problems are worsening, which is showing an increased impact on vocational functioning, especially on any task requiring attention and/or memory, as well as mild issues with motor skills.  Additionally, stress at work will exacerbate the underlying cognitive deficits. I see no evidence which would suggest malingering  I suggest short term disability be considered so as for her to fully engage with psychiatric and psychologic treatment, with a hopeful  reduction in her cognitive difficulties pending successful psychiatric intervention.  Her prognosis is currently guarded, especially as a decline is seen over time.  I find her competent currently, but she needs supervision for medications and finances, and I have concerns for driving safety at this time.  We now have baseline and updated data on her.  Follow up in six months or prn, especially if there are any declines.  Clinical correlation is, of course, indicated.       I will discuss these findings with the patient when she follows up with me in the near future.  A follow up Neuropsychological Evaluation is indicated on a prn basis, especially if there are any cognitive and/or emotional changes.      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  If there is any additional information of which I am currently unaware, I would be more than happy to review it upon having it made available to me.  Thank you for the opportunity to see this interesting individual.     Sincerely,       Kenedee Molesky A. Wynonia Hazard, PsyD, EdS    CC: Katherina Right, MD , Dr. Jettie Pagan    Time Documentation:  Time Documentation:      [phone] x1 &  [phone] x 1 Neuropsych testing/data gathering by Neuropsychologist (60 minutes)     96138 x 1  09811 x 7 Test Administration/Data Gathering By Technician: (4 hours). 91478 x 1 (first 30 minutes), 96138 x 7 (each additional 30 minutes)    96132 x 1  96133 x 1 Testing Evaluation Services by Neuropsychologist (1 hour, 50 minutes) 96132 x 1 (first hour), 96133 x 1 (50 minutes)    Definitions:      96116/96121:  Neurobehavioral Status Exam, Clinical interview.  Clinical assessment of thinking, reasoning and judgment, by neuropsychologist, both face to face time with patient and time interpreting those test results and reporting,  first and subsequent hours)    96138/96139: Neuropsychological Test Administration by Technician/Psychometrist, first 30 minutes and each additional 30 minutes.     The above includes: Record review.  Review of history provided by patient.  Review of collaborative information.  Testing by Clinician.  Review of raw data. Scoring. Report writing of individual tests administered by Clinician.  Integration of individual tests administered by psychometrist with NSE/testing by clinician, review of records/history/collaborative information, case Conceptualization, treatment planning, clinical decision making, report writing, coordination Of Care. Psychometry test codes as time spent by psychometrist administering and scoring neurocognitive/psychological tests under supervision of neuropsychologist.  Integral services including scoring of raw data, data interpretation, case conceptualization, report writing etcetera were  initiated after the patient finished testing/raw data collected and was completed on the date the report was signed.

## 2018-04-22 NOTE — Progress Notes (Signed)
[facility]  [facility]  NEUROLOGY CLINIC   [address]   [place], [place] [address]   [phone] Office   [phone] Fax      Neuropsychological Evaluation Report    Referral:  Katherina Right, MD, Dr. Mora Bellman Vanessa [name] is a 62 y.o. right handed married African American female who was unaccompanied to the initial clinical interview on 04/16/18 .  Please refer to her medical records for details pertaining to her history.  Briefly, the patient reported that she completed two years of college without history of previously diagnosed LD and/or receipt of special education services.  Stopped working June 3 and she has applied for long term disability.  She has been dealing with a progressive decline in short term memory.  Forgets the content of conversations. Misplaces things. Has to write everything down.  Starts tasks and does not complete. Loses words.  Loses train of thought.  Loses two pairs of glasses and three diamond rings in the past two months.  Slower to learn and comprehend new things.  No history of stroke, meningitis/encephalitis, RMS Fever, Lupus, Lyme, TBI, sz.  She gets headaches when she is really tired.  Her mother had dementia and was showing memory problems in her 14s and died at age 55.  Patient has not noticed no major changes in her sense of taste or smell.  She has missed exits when driving and avoids driving at night or with heavy traffic.  No accidents or near misses.  She is fairly independent for medications but keeps notes, and same with finances.  She is independent for her ADLs.  Sleep is poor, and has been poor for quite some time.  She does get frequent headaches and is tired a lot which makes the headaches worse, too. Appetite is variable, but has increased.  She has been on different medications for depression and anxiety for a number  of years now and is currently on Wellbutrin and Lexapro.  She is seeing Dr. Roddie Mc for psychotherapy and continues to engage in same.  She has noticed no major improvements there and cognition is declining.  However, the medications do help with panic attacks, which are less frequent now.  She has having a lot more panic attacks when working (2-3x a week).  No falls but loses her balance on occasion.  She is engaged in church activities.  Marriage is going well and family notices the cognitive decline and are noted.      I also reviewed job performance reviews and improvement plans and cognitive issues are noted here and are noted as ongoing and worsening despite attempts and understanding and moves towards improved job performance.      Date: ??02/01/2018    Requesting Physician: ??Stacey L. Epps, MD    An EEG is requested in this 62 year-old woman to evaluate for   epileptiform abnormalities. ??    Medications: ??Medications are listed as Lexapro, Diovan, calcium.   ??    This tracing is obtained during the awake state. ??During   wakefulness, there are brief intermittent runs of   posteriorly-dominant and symmetrical low-to-medium amplitude 9   cycle per second activities which attenuate with eye opening. ??  Lower-voltage faster-frequency activities are seen symmetrically   over the anterior head regions.    Hyperventilation is not performed due to age and medical history.   ??    Intermittent photic stimulation induces symmetric posterior  driving responses. ??    Sleep is not attained. ??    Interpretation: ??This EEG recorded during the awake state is   normal. ??No epileptiform abnormalities are seen.       Result Information     Status: Final result (Exam End: 11/22/2017 17:12) Provider Status: Open   Study Result     EXAM:  MRI BRAIN W WO CONT  INDICATION:  mild cognitive impairment, G 31.84, memory loss, blackouts, sharp  pain in shoulders and needles in arms and legs worse on right side.   TECHNIQUE: Sagittal and axial T1 weighted images were obtained followed by  intravenous infusion 13 mL Dotarem and axial FLAIR and T-2 weighted images.  Delayed axial and coronal postgadolinium T1-weighted images were obtained as  well as sagittal FLAIR images and axial diffusion weighted images.  COMPARISON: MRI of the head 04/08/13, CT 02/16/13  FINDINGS:  The ventricular size and configuration are normal.   Scattered small foci of T2 hyperintensity again demonstrated in the cerebral  white matter most prominent in the left frontal lobe without significant change  since the MRI 04/08/13. No associated abnormal enhancement or restricted  diffusion.  There is no evidence of intracranial hemorrhage, infarct, mass or abnormal  extra-axial fluid collections.   Flow voids are present in the vertebral, basilar and carotid artery systems.    The craniocervical junction is unremarkable.   The other structures of the cranial base including paranasal sinuses are   unremarkable.  ??  IMPRESSION  IMPRESSION:   1. Chronic nonspecific foci of T2 hyperintensity cerebral white matter without  significant change since 04/08/13.  2. No acute intracranial abnormality demonstrated..  ??         Neuropsych reviewed from 2018.   This is a lengthy report and history reported is consistent with history reported here.  Concerns for MCI with mood issues raised and now concern is for a dementia versus pseudodementia.      Neuropsychological Mental Status Exam (NMSE):  Historian: Good  Praxis: No UE apraxia  R/L Orientation: Intact to self and to other  Dress: within normal limits   Weight: within normal limits   Appearance/Hygiene: within normal limits   Gait: within normal limits   Assistive Devices: Glasses  Mood: Mildly depressed  Affect: Mildly flat  Comprehension: within normal limits   Thought Process: within normal limits   Expressive Language: within normal limits   Receptive Language: within normal limits    Motor:  No cognitive or motor perseveration  ETOH: Denied  Tobacco: Denied  Illicit: Denied  SI/HI: Denied current, but when things were really bad a year or so ago she had passive thoughts.  No plan, intent, or attempts.  She has been previously psychiatrically hospitalized at [facility].    Psychosis: Denied  Insight: Within normal limits  Judgment: Within normal limits  Other Psych:      Past Medical History:   Diagnosis Date   ??? Depression    ??? H/O colonoscopy 09-24-12    diverticulosis   ??? Hepatic steatosis    ??? Hypertension    ??? 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 Normal cardiac stress test 02-10-12   ??? Pneumonia        Past Surgical History:   Procedure Laterality Date   ??? HX GYN      tubal ligation       Allergies   Allergen  Reactions   ??? Amlodipine Other (comments)     Loss of memory,nervous,jettery   ??? Losartan Other (comments)     Nervous,jettery   ??? Nifedipine Other (comments)     Swelling,palpations       Family History   Problem Relation Age of Onset   ??? Heart Disease Mother    ??? Heart Disease Father        Social History     Tobacco Use   ??? Smoking status: Never Smoker   ??? Smokeless tobacco: Never Used   Substance Use Topics   ??? Alcohol use: No   ??? Drug use: No       Current Outpatient Medications   Medication Sig Dispense Refill   ??? gabapentin (NEURONTIN) 100 mg capsule Take 1 Cap by mouth three (3) times daily. 90 Cap 5   ??? CHOLECALCIFEROL, VITAMIN D3, PO Take 5,000 Int'l Units/day by mouth.     ??? meloxicam (MOBIC) 15 mg tablet Take 15 mg by mouth.     ??? buPROPion (WELLBUTRIN) 75 mg tablet TAKE 1 TABLET BY MOUTH TWICE DAILY PLEASE TAKE THE SECOND DOSE AT NOON.  2   ??? losartan-hydroCHLOROthiazide (HYZAAR) 100-25 mg per tablet Take 1 Tab by mouth daily.     ??? escitalopram oxalate (LEXAPRO) 10 mg tablet Take 10 mg by mouth daily.     ??? minoxidil (ROGAINE) 2 % external solution Apply  to affected area two  (2) times a day. Apply to scalp and rub it in     ??? VAYARIN 75-8.5-21.5 mg cap Take 1 Cap by mouth two (2) times a day.           Plan:  Obtain authorization for testing from insurance company.  Report to follow once testing, scoring, and interpretation completed.  ? Organic based neurocognitive issues versus mood disorder or combination of same.  ? Problems organic, functional, or both? This note will not be viewable in MyChart.      54098*1 19147*8 1 hour 50 mintes spent reviewing extensive records across systems here and time with patient.     Neuropsychological Evaluation  Patient Testing 04/22/18 Report Completed 04/23/18  A Psychometrist Assisted w/ portions of this evaluation while under my direct supervision    Please refer to the patient's initial interview progress note (copied above) and her medical records for details pertaining to her history.  Today's neuropsychological test scores follow:    The following assessment procedures were performed:      Neuropsychologist Performed, Interpreted, & Reported: Neuropsychological Mental Status Exam, Revised Memory & Behavior Checklist, Mini Mental State Exam, Clock Drawing Test, Test Of Premorbid Functioning, McGill-Melzack Pain Questionnaire,  History Taking  & Clinical Interview With The Patient, PAI, CPT, Review Of Available Records.    Psychometrist Administered & Neuropsychologist Interpreted & Neuropsychologist Reported: Chief Financial Officer, Grooved Pegboard Test, [employer], Wechsler Adult Intelligence Scale ??? IV, Verbal Fluency Tests, [employer] ??? Revised, Trailmaking Test Parts A & B, [place] Verbal Learning Test ??? 3, Rey Complex Figure Test, Beck Depression Inventory ??? II, Beck Anxiety Inventory.      Test Findings:  Note:  The patient???s raw data have been compared with currently available norms which include demographic corrections for age, gender, and/or education.  Sometimes, the patient???s scores are compared to  demographically similar individuals as close to the patient???s age, education level, etc., as possible.  "Average" is viewed as being +/- 1 standard deviation (SD) from the stated mean for  a particular test score.  "Low average" is viewed as being between 1 and 2 SD below the mean, and above average is viewed as being 1 and 2 SD above the mean.  Scores falling in the ???borderline??? range (between 1-1/2 and 2 SD below the mean) are viewed with particular attention as to whether they are normal or abnormal neurocognitive test scores.  Other methods of inference in analyzing the test data are also utilized, including the pattern and range of scores in the profile, bilateral motor functions, and the presence, if any, of pathognomonic signs.      Behaviorally, the patient was friendly and cooperative and appeared motivated to perform well during this examination.  Within this context, the results of this evaluation are viewed as a valid reflection of the patient???s actual neurocognitive and emotional status.       Her structured word list fluency, as assessed by the FAS Test, was within the below average range with a T score of 42.  Category fluency was within the below average range with a T score of 41  Confrontation naming ability, as assessed by the [facility] Naming Test ??? Revised, was within the average range at 52/60 correct (T = 52).   This pattern of performance is not indicative of a patient who is at increased risk for day-to-day problems with verbal fluency and confrontation naming ability.       The patient was administered the Conners??? Continuous Performance Test ??? III, a computer-administered test of sustained attention, and review of the subscales within this instrument revealed moderate to severe concern for inattentiveness with additional concern for impulsivity.  .  Nonverbal auditory attention and discrimination, as assessed by the [facility] Rhythm  Test (T = 33) was mildly to moderately impaired.  This pattern of performance is  indicative of a patient who is at increased risk for day-to-day problems with sustained visual attention/concentration and nonverbal auditory attention.       The patient was administered the Wechsler Adult Intelligence Scale ??? IV.  See Appendix I for full breakdown of IQ test scores (scanned into media section of this EMR).  As can be seen, there was no clinically significant difference between her borderline range Working Memory Index score of 71 (3rd %ile) and her borderline range Processing Speed Index score of 76 (5th %ile).  Her   Verbal Comprehension Index score of 83 (13th %ile) was within the low average range.  Her Perceptual Reasoning Index score of 88 (21st %ile) was within the low average range.  This pattern of performance is  indicative of a patient who is at increased risk for day-to-day problems with working memory and processing speed, and those scores are lower than expected based on her performance on a task estimating premorbid functioning levels.       The patient was administered the [place] Verbal Learning Test  - 3 and generated a normal range and positive learning curve over five repeated auditory word list learning trials.  An interference trial was within normal limits.  Free and cued, short and long delayed recall were within the normal range.  Recognition and forced choice recall were within normal limits.  This pattern of performance is not indicative of a patient who is at increased risk for day-to-day problems with auditory learning and/or memory.       The patient???s performance on the copy portion of the Rey Complex Figure Test was within normal limits  (11-16th %ile).  Recall for the complex  design was within the normal range after a short delay (10th %ile) and low after a long delay (4th %ile).   Recognition recall was impaired (<1st  %ile).   This pattern of performance is indicative of a patient who is at increased risk for day-to-day problems with visual delayed memory.       Simple timed visual motor sequencing (Trailmaking Test Part A) was within the average range with a T score of 45.  Her performance on a similar, but more complex task of timed visual motor sequencing (Trailmaking Test Part B) was within the below average range with a T score of 42.  She made zero sequencing errors on this latter test.  Taken together, this pattern of performance is not indicative of a patient who is at increased risk for day-to-day problems with executive functioning.       Motor speed for finger tapping was within the mildly impaired range for her dominant hand (T = 36) and mildly to moderately impaired for her nondominant hand (T = 34).  Fine motor dexterity was mildly impaired bilaterally. Neurologic correlation is indicated.       The patient rated her current level of pain as "1/5- Mild" on the McGill-Melzack Pain Questionnaire.  She reported pain in her upper back, arms, lower back, knees, and right foot.       Marland Kitchen Her Beck Depression Inventory ???II score of 31 was within the severely depressed range.  Her Beck Anxiety Inventory score of 29 reflected severe anxiety.       The patient was also administered the Personality Assessment Inventory and generated ain invalid profile for further interpretation due to a high level of patient response inconsistencies.       I also reviewed a Neuropsych evaluation by another provider completed in 2017.  Results from that evaluation also indicated mild cognitive issues involving mostly attention and memory. Comparison with previous testing shows a mild trend of decline in visual memory over time and a more significant decline in attention over time.  She was reporting significant depression, anxiety, and PTSD on valid range personality testing with the  PAI.  The current PAI is deemed invalid due to problems with item comprehension and inconsistent responses over time.       Impressions & Recommendations:  This patient has now undergone two Neuropsychological Evaluations (previously by another provider in 2017 and the incident evaluation with me). She was previously diagnosed with Mild Cognitive Impairment and there was significant psychiatric overlay.  The current test results indicate a mild DECLINE in cognitive functioning over time, with significant problems noted in terms of severe deficits in attention (previously moderate) and significant (moderate to severe) decline in visual memory.  Interestingly, her auditory memory is fully normal, which leads away from a dementia type diagnosis.  She is also showing borderline range problems with working memory and processing speed, as well as problems with bilateral motor skills.  Executive functioning, perceptual reasoning, verbal comprehension, verbal fluency, and confrontation naming remain normal.  From an emotional standpoint, she reports severe depression and severe anxiety on two brief self-report questionnaires.  Lengthier assessment of personality functioning could not be further interpreted due to item confusion/comprehension and consistency problems.  She previously generated a valid PAI which showed depression, anxiety, and PTSD.  Ongoing PTSD issues are likely.     While the current profile not consistent with dementia, there is sufficient evidence to suggest a worsening mild cognitive disorder with significant psychiatric overlay. Consideration for  psychiatric medication management for marked attention problems is suggested, along with psychiatric treatment and intensive psychotherapy for severe mood problems.  Consider EMDR.  At this point, her problems are worsening, which is showing an increased impact on vocational functioning, especially  on any task requiring attention and/or memory, as well as mild issues with motor skills.  Additionally, stress at work will exacerbate the underlying cognitive deficits. I see no evidence which would suggest malingering  I suggest short term disability be considered so as for her to fully engage with psychiatric and psychologic treatment, with a hopeful reduction in her cognitive difficulties pending successful psychiatric intervention.  Her prognosis is currently guarded, especially as a decline is seen over time.  I find her competent currently, but she needs supervision for medications and finances, and I have concerns for driving safety at this time.  We now have baseline and updated data on her.  Follow up in six months or prn, especially if there are any declines.  Clinical correlation is, of course, indicated.       I will discuss these findings with the patient when she follows up with me in the near future.  A follow up Neuropsychological Evaluation is indicated on a prn basis, especially if there are any cognitive and/or emotional changes.      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  If there is any additional information of which I am currently unaware, I would be more than happy to review it upon having it made available to me.  Thank you for the opportunity to see this interesting individual.     Sincerely,       Laxmi Choung A. Wynonia Hazard, PsyD, EdS    CC: Katherina Right, MD , Dr. Jettie Pagan    Time Documentation:  Time Documentation:      [phone] [phone] &  [phone] x 1 Neuropsych testing/data gathering by Neuropsychologist (60 minutes)     96138 x 1  56433 x 7 Test Administration/Data Gathering By Technician: (4 hours). 29518 x 1 (first 30 minutes), 96138 x 7 (each additional 30 minutes)    96132 x 1   96133 x 1 Testing Evaluation Services by Neuropsychologist (1 hour, 50 minutes) 96132 x 1 (first hour), 96133 x 1 (50 minutes)    Definitions:      96116/96121:  Neurobehavioral Status Exam, Clinical interview.  Clinical assessment of thinking, reasoning and judgment, by neuropsychologist, both face to face time with patient and time interpreting those test results and reporting, first and subsequent hours)    96138/96139: Neuropsychological Test Administration by Technician/Psychometrist, first 30 minutes and each additional 30 minutes.     The above includes: Record review.  Review of history provided by patient.  Review of collaborative information.  Testing by Clinician.  Review of raw data. Scoring. Report writing of individual tests administered by Clinician.  Integration of individual tests administered by psychometrist with NSE/testing by clinician, review of records/history/collaborative information, case Conceptualization, treatment planning, clinical decision making, report writing, coordination Of Care. Psychometry test codes as time spent by psychometrist administering and scoring neurocognitive/psychological tests under supervision of neuropsychologist.  Integral services including scoring of raw data, data interpretation, case conceptualization, report writing etcetera were  initiated after the patient finished testing/raw data collected and was completed on the date the report was signed.

## 2018-04-29 NOTE — Telephone Encounter (Signed)
Returned call to Ms. Vanessa [name] to further inquire on message received. She doesn't want to return to work now and wants you to send a letter to [employer] stating she can't return. Paperwork was filled out and sent to them last Friday.

## 2018-04-29 NOTE — Telephone Encounter (Signed)
I'm not going to permanently pull her out of work and won't write a letter saying that she can't work for forever.

## 2018-04-29 NOTE — Telephone Encounter (Signed)
Pt is requesting a call back in ref to her letter to return to work and her results   Please call [phone]

## 2018-04-30 ENCOUNTER — Other Ambulatory Visit: Payer: Self-pay | Admitting: Internal Medicine

## 2018-04-30 DIAGNOSIS — F411 Generalized anxiety disorder: Secondary | ICD-10-CM

## 2018-04-30 DIAGNOSIS — I2782 Chronic pulmonary embolism: Secondary | ICD-10-CM

## 2018-04-30 MED FILL — ATORVASTATIN 40 MG TABLET: 40 | 30 days supply | Qty: 30 | Fill #1

## 2018-04-30 NOTE — Telephone Encounter (Signed)
Returned call no answer left message with information provided by Dr. Wynonia HazardKhawaja. He says he's unable to write letter taking Vanessa [name] out of work permanently.

## 2018-05-14 MED FILL — XARELTO 20 MG TABLET: 20 | 30 days supply | Qty: 30 | Fill #0

## 2018-05-14 MED FILL — hydrOXYzine HCL 25 MG TABS: 25 | 10 days supply | Qty: 30 | Fill #0

## 2018-05-19 ENCOUNTER — Ambulatory Visit (HOSPITAL_COMMUNITY)
Admission: RE | Admit: 2018-05-19 | Discharge: 2018-05-19 | Disposition: A | Discharge status: Home / Self Care | Payer: Medicaid Other | Source: Ambulatory Visit | Attending: Neurology | Admitting: Neurology

## 2018-05-19 ENCOUNTER — Telehealth: Payer: Self-pay | Admitting: Neurology

## 2018-05-19 DIAGNOSIS — G6181 Chronic inflammatory demyelinating polyneuritis: Secondary | ICD-10-CM | POA: Diagnosis not present

## 2018-05-19 MED ORDER — SODIUM CHLORIDE 0.9 % IV SOLN
1000.0000 mg | INTRAVENOUS | Status: DC
  Administered 2018-05-19: 1000 mg via INTRAVENOUS
  Filled 2018-05-19: qty 8

## 2018-05-19 NOTE — Telephone Encounter (Signed)
Patient now has [employer] and she would like to speak with you regarding having Injections at home? Please Call. Thanks

## 2018-05-20 ENCOUNTER — Encounter (HOSPITAL_COMMUNITY): Payer: Medicaid Other

## 2018-05-20 NOTE — Telephone Encounter (Signed)
I spoke with patient and she now has [employer].  Sent a message to [facility]) to let her know and they will contact patient to start the infusion process.

## 2018-06-02 ENCOUNTER — Telehealth

## 2018-06-02 NOTE — Telephone Encounter (Addendum)
Pt is calling in ref to getting a call back to see if medication Dadapentin 100 mg dosage needs changing as she feels drained and tired. Muscles also weak  Best # [phone]

## 2018-06-03 NOTE — Telephone Encounter (Signed)
-----   Message from Elease EtienneKaren I Harris sent at 06/03/2018 11:07 AM EDT -----  Regarding: Dr  Epps/telephone  Patient return call    Caller's first and last name and relationship (if not the patient):      Best contact number(s):  [phone]    Whose call is being returned: Kennyth ArnoldStacy the nurse      Details to clarify the request:  Regarding her medication     Elease EtienneKaren I Harris

## 2018-06-03 NOTE — Telephone Encounter (Signed)
LVM for pt to either call office or send my chart msg

## 2018-06-05 MED ORDER — LIDOCAINE-PRILOCAINE 2.5 %-2.5 % TOPICAL CREAM
CUTANEOUS | 3 refills | Status: DC | PRN
Start: 2018-06-05 — End: 2019-11-29

## 2018-06-05 NOTE — Telephone Encounter (Signed)
Emla cream sent to pharmacy per VO Dr. Jettie PaganEpps.  Pt notified

## 2018-06-14 NOTE — Addendum Note (Signed)
Encounter addended by: Leonarda Salon, RN on: 06/14/2018 3:27 PM  Actions taken: Charge Capture section accepted

## 2018-06-15 ENCOUNTER — Other Ambulatory Visit: Payer: Self-pay | Admitting: *Deleted

## 2018-06-15 ENCOUNTER — Encounter: Payer: Self-pay | Admitting: Internal Medicine

## 2018-06-15 ENCOUNTER — Ambulatory Visit: Payer: Medicaid Other | Attending: Internal Medicine | Admitting: Internal Medicine

## 2018-06-15 ENCOUNTER — Other Ambulatory Visit: Payer: Self-pay | Admitting: Internal Medicine

## 2018-06-15 VITALS — BP 142/88 | HR 117 | Temp 98.4°F | Resp 16 | Ht 62.0 in | Wt 185.8 lb

## 2018-06-15 DIAGNOSIS — G6181 Chronic inflammatory demyelinating polyneuritis: Secondary | ICD-10-CM

## 2018-06-15 DIAGNOSIS — F411 Generalized anxiety disorder: Secondary | ICD-10-CM | POA: Insufficient documentation

## 2018-06-15 DIAGNOSIS — Z7689 Persons encountering health services in other specified circumstances: Secondary | ICD-10-CM | POA: Diagnosis not present

## 2018-06-15 DIAGNOSIS — Z7901 Long term (current) use of anticoagulants: Secondary | ICD-10-CM | POA: Diagnosis not present

## 2018-06-15 DIAGNOSIS — Z86718 Personal history of other venous thrombosis and embolism: Secondary | ICD-10-CM | POA: Diagnosis not present

## 2018-06-15 DIAGNOSIS — R030 Elevated blood-pressure reading, without diagnosis of hypertension: Secondary | ICD-10-CM

## 2018-06-15 DIAGNOSIS — L409 Psoriasis, unspecified: Secondary | ICD-10-CM | POA: Insufficient documentation

## 2018-06-15 DIAGNOSIS — F17210 Nicotine dependence, cigarettes, uncomplicated: Secondary | ICD-10-CM | POA: Diagnosis not present

## 2018-06-15 DIAGNOSIS — G4733 Obstructive sleep apnea (adult) (pediatric): Secondary | ICD-10-CM | POA: Diagnosis not present

## 2018-06-15 DIAGNOSIS — Z86711 Personal history of pulmonary embolism: Secondary | ICD-10-CM | POA: Insufficient documentation

## 2018-06-15 DIAGNOSIS — Z79899 Other long term (current) drug therapy: Secondary | ICD-10-CM | POA: Insufficient documentation

## 2018-06-15 DIAGNOSIS — Z716 Tobacco abuse counseling: Secondary | ICD-10-CM | POA: Insufficient documentation

## 2018-06-15 DIAGNOSIS — F17200 Nicotine dependence, unspecified, uncomplicated: Secondary | ICD-10-CM

## 2018-06-15 DIAGNOSIS — Z8249 Family history of ischemic heart disease and other diseases of the circulatory system: Secondary | ICD-10-CM | POA: Diagnosis not present

## 2018-06-15 MED ORDER — HYDROXYZINE HCL 25 MG PO TABS: 25.0000 mg | ORAL_TABLET | Freq: Two times a day (BID) | ORAL | 2 refills | Status: DC | PRN
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
## 2018-06-16 ENCOUNTER — Ambulatory Visit (HOSPITAL_COMMUNITY)
Admission: RE | Admit: 2018-06-16 | Discharge: 2018-06-16 | Disposition: A | Discharge status: Home / Self Care | Payer: Medicaid Other | Source: Ambulatory Visit | Attending: Neurology | Admitting: Neurology

## 2018-06-16 DIAGNOSIS — G6181 Chronic inflammatory demyelinating polyneuritis: Secondary | ICD-10-CM | POA: Diagnosis not present

## 2018-06-16 MED ORDER — METHYLPREDNISOLONE SODIUM SUCC 1000 MG IJ SOLR: 1000.0000 mg | Freq: Once | INTRAMUSCULAR | Status: DC

## 2018-06-16 MED ORDER — SODIUM CHLORIDE 0.9 % IV SOLN
1000.0000 mg | Freq: Once | INTRAVENOUS | Status: AC
  Administered 2018-06-16: 10:00:00 1000 mg via INTRAVENOUS
  Filled 2018-06-16: qty 8

## 2018-06-17 ENCOUNTER — Telehealth: Payer: Self-pay | Admitting: Neurology

## 2018-06-17 NOTE — Telephone Encounter (Signed)
Patient called wanting to see if you received her Medicaid paperwork and wanted to talk to nurse about her shots. She did not know the name. Please call her back at [phone]. Thanks.

## 2018-06-21 ENCOUNTER — Encounter

## 2018-06-21 MED ORDER — GABAPENTIN 100 MG CAP
100 mg | ORAL_CAPSULE | ORAL | 5 refills | Status: DC
Start: 2018-06-21 — End: 2019-01-13

## 2018-06-22 ENCOUNTER — Ambulatory Visit: Attending: Clinical Neuropsychologist | Primary: Family Medicine

## 2018-06-22 ENCOUNTER — Ambulatory Visit
Admit: 2018-06-22 | Discharge: 2018-06-22 | Payer: PRIVATE HEALTH INSURANCE | Attending: Clinical Neuropsychologist | Primary: Family Medicine

## 2018-06-22 ENCOUNTER — Encounter

## 2018-06-22 DIAGNOSIS — G3184 Mild cognitive impairment, so stated: Secondary | ICD-10-CM

## 2018-06-22 NOTE — Progress Notes (Signed)
Psychoeducational and supportive psychotherapy and feedback session with the patient today.  I reviewed the results of the recent Neuropsychological Evaluation, including discussing individual tests as well as patient's areas of neurocognitive strength versus weakness.    Prior to seeing the patient I reviewed the records, including the previously completed report, the records in [facility], and any updated visits from other providers since I saw the patient last.      We discussed, in detail, the following:  This patient has now undergone two Neuropsychological Evaluations (previously by another provider in 2017 and the incident evaluation with me). She was previously diagnosed with Mild Cognitive Impairment and there was significant psychiatric overlay.  The current test results indicate a mild DECLINE in cognitive functioning over time, with significant problems noted in terms of severe deficits in attention (previously moderate) and significant (moderate to severe) decline in visual memory.  Interestingly, her auditory memory is fully normal, which leads away from a dementia type diagnosis.  She is also showing borderline range problems with working memory and processing speed, as well as problems with bilateral motor skills.  Executive functioning, perceptual reasoning, verbal comprehension, verbal fluency, and confrontation naming remain normal.  From an emotional standpoint, she reports severe depression and severe anxiety on two brief self-report questionnaires.  Lengthier assessment of personality functioning could not be further interpreted due to item confusion/comprehension and consistency problems.  She previously generated a valid PAI which showed depression, anxiety, and PTSD.  Ongoing PTSD issues are likely.  ??              While the current profile not consistent with dementia, there is sufficient evidence to suggest a worsening mild cognitive disorder with significant psychiatric overlay.  Consideration for psychiatric medication management for marked attention problems is suggested, along with psychiatric treatment and intensive psychotherapy for severe mood problems.  Consider EMDR.  At this point, her problems are worsening, which is showing an increased impact on vocational functioning, especially on any task requiring attention and/or memory, as well as mild issues with motor skills.  Additionally, stress at work will exacerbate the underlying cognitive deficits. I see no evidence which would suggest malingering  I suggest short term disability be considered so as for her to fully engage with psychiatric and psychologic treatment, with a hopeful reduction in her cognitive difficulties pending successful psychiatric intervention.  Her prognosis is currently guarded, especially as a decline is seen over time.  I find her competent currently, but she needs supervision for medications and finances, and I have concerns for driving safety at this time.  We now have baseline and updated data on her.  Follow up in six months or prn, especially if there are any declines.  Clinical correlation is, of course, indicated.    ??              I will discuss these findings with the patient when she follows up with me in the near future.  A follow up Neuropsychological Evaluation is indicated on a prn basis, especially if there are any cognitive and/or emotional changes.    ??  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   I also answered numerous questions related to the clinical findings, including discussing  various methods to improve cognition and mood.  Counseling provided regarding mood and cognition.   CBT and supportive psychotherapy techniques were utilized.  Supportive/Cognitive Behavioral/Solution Focused psychotherapy provided  Discussed rational versus irrational thinking patterns and their consequences.Discussed healthy/adaptive and unhealthy/maladaptive coping.        30 minutes as above

## 2018-06-22 NOTE — Telephone Encounter (Signed)
I called patient and she gave me the new Medicaid number.  Sent [facility] a message with the new #.

## 2018-06-22 NOTE — Progress Notes (Signed)
Psychoeducational and supportive psychotherapy and feedback session with the patient today.  I reviewed the results of the recent Neuropsychological Evaluation, including discussing individual tests as well as patient's areas of neurocognitive strength versus weakness.    Prior to seeing the patient I reviewed the records, including the previously completed report, the records in [facility], and any updated visits from other providers since I saw the patient last.      We discussed, in detail, the following:  This patient has now undergone two Neuropsychological Evaluations (previously by another provider in 2017 and the incident evaluation with me). She was previously diagnosed with Mild Cognitive Impairment and there was significant psychiatric overlay.  The current test results indicate a mild DECLINE in cognitive functioning over time, with significant problems noted in terms of severe deficits in attention (previously moderate) and significant (moderate to severe) decline in visual memory.  Interestingly, her auditory memory is fully normal, which leads away from a dementia type diagnosis.  She is also showing borderline range problems with working memory and processing speed, as well as problems with bilateral motor skills.  Executive functioning, perceptual reasoning, verbal comprehension, verbal fluency, and confrontation naming remain normal.  From an emotional standpoint, she reports severe depression and severe anxiety on two brief self-report questionnaires.  Lengthier assessment of personality functioning could not be further interpreted due to item confusion/comprehension and consistency problems.  She previously generated a valid PAI which showed depression, anxiety, and PTSD.  Ongoing PTSD issues are likely.  ??              While the current profile not consistent with dementia, there is sufficient evidence to suggest a worsening mild cognitive disorder with  significant psychiatric overlay. Consideration for psychiatric medication management for marked attention problems is suggested, along with psychiatric treatment and intensive psychotherapy for severe mood problems.  Consider EMDR.  At this point, her problems are worsening, which is showing an increased impact on vocational functioning, especially on any task requiring attention and/or memory, as well as mild issues with motor skills.  Additionally, stress at work will exacerbate the underlying cognitive deficits. I see no evidence which would suggest malingering  I suggest short term disability be considered so as for her to fully engage with psychiatric and psychologic treatment, with a hopeful reduction in her cognitive difficulties pending successful psychiatric intervention.  Her prognosis is currently guarded, especially as a decline is seen over time.  I find her competent currently, but she needs supervision for medications and finances, and I have concerns for driving safety at this time.  We now have baseline and updated data on her.  Follow up in six months or prn, especially if there are any declines.  Clinical correlation is, of course, indicated.    ??              I will discuss these findings with the patient when she follows up with me in the near future.  A follow up Neuropsychological Evaluation is indicated on a prn basis, especially if there are any cognitive and/or emotional changes.    ??  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   I also answered numerous questions  related to the clinical findings, including discussing various methods to improve cognition and mood.  Counseling provided regarding mood and cognition.   CBT and supportive psychotherapy techniques were utilized.  Supportive/Cognitive Behavioral/Solution Focused psychotherapy provided  Discussed rational versus irrational thinking patterns and their consequences.Discussed healthy/adaptive and unhealthy/maladaptive coping.        30 minutes as above

## 2018-06-29 ENCOUNTER — Telehealth: Payer: Self-pay | Admitting: Hematology and Oncology

## 2018-06-29 ENCOUNTER — Inpatient Hospital Stay: Payer: Medicaid Other

## 2018-06-29 ENCOUNTER — Inpatient Hospital Stay: Payer: Medicaid Other | Attending: Hematology and Oncology | Admitting: Hematology and Oncology

## 2018-06-29 ENCOUNTER — Encounter: Payer: Self-pay | Admitting: Hematology and Oncology

## 2018-06-29 ENCOUNTER — Other Ambulatory Visit: Payer: Self-pay | Admitting: Hematology and Oncology

## 2018-06-29 DIAGNOSIS — I82409 Acute embolism and thrombosis of unspecified deep veins of unspecified lower extremity: Secondary | ICD-10-CM | POA: Diagnosis not present

## 2018-06-29 DIAGNOSIS — I825Y9 Chronic embolism and thrombosis of unspecified deep veins of unspecified proximal lower extremity: Secondary | ICD-10-CM

## 2018-06-29 DIAGNOSIS — Z7901 Long term (current) use of anticoagulants: Secondary | ICD-10-CM | POA: Insufficient documentation

## 2018-06-29 DIAGNOSIS — I2782 Chronic pulmonary embolism: Secondary | ICD-10-CM

## 2018-06-29 DIAGNOSIS — G6181 Chronic inflammatory demyelinating polyneuritis: Secondary | ICD-10-CM

## 2018-06-29 LAB — CBC WITH DIFFERENTIAL/PLATELET
BASOS PCT: 1 %
Basophils Absolute: 0 10*3/uL (ref 0.0–0.1)
EOS PCT: 1 %
Eosinophils Absolute: 0.1 10*3/uL (ref 0.0–0.5)
HEMATOCRIT: 39.9 % (ref 34.8–46.6)
Hemoglobin: 13 g/dL (ref 11.6–15.9)
LYMPHS PCT: 28 %
Lymphs Abs: 2.4 10*3/uL (ref 0.9–3.3)
MCH: 30.4 pg (ref 25.1–34.0)
MCHC: 32.6 g/dL (ref 31.5–36.0)
MCV: 93.2 fL (ref 79.5–101.0)
MONO ABS: 0.6 10*3/uL (ref 0.1–0.9)
MONOS PCT: 7 %
NEUTROS ABS: 5.5 10*3/uL (ref 1.5–6.5)
Neutrophils Relative %: 63 %
Platelets: 302 10*3/uL (ref 145–400)
RBC: 4.28 MIL/uL (ref 3.70–5.45)
RDW: 13.3 % (ref 11.2–14.5)
WBC: 8.7 10*3/uL (ref 3.9–10.3)

## 2018-06-29 LAB — COMPREHENSIVE METABOLIC PANEL
ALBUMIN: 3.7 g/dL (ref 3.5–5.0)
ALK PHOS: 124 U/L (ref 38–126)
ALT: 17 U/L (ref 0–44)
ANION GAP: 6 (ref 5–15)
AST: 14 U/L — AB (ref 15–41)
BUN: 9 mg/dL (ref 6–20)
CALCIUM: 10 mg/dL (ref 8.9–10.3)
CO2: 29 mmol/L (ref 22–32)
Chloride: 107 mmol/L (ref 98–111)
Creatinine, Ser: 1.15 mg/dL — ABNORMAL HIGH (ref 0.44–1.00)
GFR calc Af Amer: 59 mL/min — ABNORMAL LOW (ref >60)
GFR calc non Af Amer: 51 mL/min — ABNORMAL LOW (ref >60)
GLUCOSE: 111 mg/dL — AB (ref 70–99)
Potassium: 4.6 mmol/L (ref 3.5–5.1)
SODIUM: 142 mmol/L (ref 135–145)
Total Bilirubin: 0.6 mg/dL (ref 0.3–1.2)
Total Protein: 7.3 g/dL (ref 6.5–8.1)

## 2018-06-29 MED ORDER — XARELTO 20 MG PO TABS: 20.0000 mg | ORAL_TABLET | Freq: Every day | ORAL | 11 refills | Status: DC

## 2018-06-29 MED FILL — XARELTO 20 MG TABLET: 20 | 30 days supply | Qty: 30 | Fill #0

## 2018-06-29 MED FILL — hydrOXYzine HCL 25 MG TABS: 25 | 15 days supply | Qty: 30 | Fill #0

## 2018-06-29 NOTE — Telephone Encounter (Signed)
Gave avs and calendar ° °

## 2018-06-30 ENCOUNTER — Telehealth: Payer: Self-pay | Admitting: Neurology

## 2018-06-30 ENCOUNTER — Encounter: Payer: Self-pay | Admitting: Hematology and Oncology

## 2018-06-30 NOTE — Assessment & Plan Note (Signed)
She has been referred to see neurologist and is undergoing evaluation and treatment I would defer to them for further follow-up

## 2018-06-30 NOTE — Telephone Encounter (Signed)
Patient called needing to speak with you regarding Shots? Please Call. Thanks

## 2018-06-30 NOTE — Assessment & Plan Note (Signed)
She is doing with very well since her treatment was switched to Xarelto. She has no complications from treatment. Goal of treatment is lifelong. I will see her once a year She is educated to watch for signs and symptoms of bleeding 

## 2018-06-30 NOTE — Progress Notes (Signed)
[facility] OFFICE PROGRESS NOTE  [name], MD  ASSESSMENT & PLAN:  DVT (deep venous thrombosis) She is doing with very well since her treatment was switched to Xarelto. She has no complications from treatment. Goal of treatment is lifelong. I will see her once a year She is educated to watch for signs and symptoms of bleeding  CIDP (chronic inflammatory demyelinating polyneuropathy) ([place]) She has been referred to see neurologist and is undergoing evaluation and treatment I would defer to them for further follow-up   No orders of the defined types were placed in this encounter.   INTERVAL HISTORY: Tammy [name] 62 y.o. female returns for further follow-up in regards to history of recurrent DVT She is feeling well The patient denies any recent signs or symptoms of bleeding such as spontaneous epistaxis, hematuria or hematochezia. She is compliant taking medications as directed Denies recent chest pain or shortness of breath  SUMMARY OF HEMATOLOGIC HISTORY:  She is being referred because of recurrent DVT. According to the patient, she was first diagnosed with blood clot in 2008. According to the patient it was unprovoked. The patient complained of shortness of breath and cough and chest discomfort. She was smoking at that time. She had imaging study of the lung done which show evidence of PE. According to the patient she was on a blood thinner for 2 years. The date of the CT scan was 06/24/2007. In 2014, she started to complain of intermittent sharp discomfort on the right leg associated with some mild swelling. She states that she had pain on the left leg as well but is not as severe compared to the right leg. She went to the emergency department. Results of the ultrasound venous Doppler came back consistent with acute deep vein thrombosis involving the posterior tibial and perioneal vein of the right lower extremity. D-dimer was elevated. She was placed on  Lovenox and transition to warfarin 5 mg daily. Her INR has been supratherapeutic requiring vitamin K and multiple dose interruption to keep her INR at the desire range at 2-3. She denies any bleeding complications such as spontaneous epistaxis, hematuria, hematochezia, or the need for blood transfusions. She had mild intermittent chest discomfort throughout this and had imaging study done which show possible new clot in the left lung on the CT scan from September 2014. She has been pregnant 4 times including C-section for her pregnancy and never developed any peripartum blood clots. There were no family history of blood clots. In October 2014, I made a decision to change her to Xarelto.  Duration of treatment is lifelong  I have reviewed the past medical history, past surgical history, social history and family history with the patient and they are unchanged from previous note.  ALLERGIES:  has No Known Allergies.  MEDICATIONS:  Current Outpatient Medications  Medication Sig Dispense Refill  . atorvastatin (LIPITOR) 40 MG tablet Take 1 tablet (40 mg total) by mouth daily. 90 tablet 3  . clobetasol cream (TEMOVATE) 6.06 % Apply 1 application topically 2 (two) times daily. 60 g 3  . gabapentin (NEURONTIN) 300 MG capsule Take 1 tablet in the morning, 1 tablet in the afternoon, and 2 tablet at bedtime 120 capsule 11  . hydrOXYzine (ATARAX/VISTARIL) 25 MG tablet Take 1 tablet (25 mg total) by mouth 2 (two) times daily as needed. 30 tablet 2  . nortriptyline (PAMELOR) 25 MG capsule Take 1 capsule (25 mg total) by mouth at bedtime. 30 capsule 11  .  rivaroxaban (XARELTO) 10 MG TABS tablet Take 1 tablet (10 mg total) by mouth daily. 90 tablet 11  . XARELTO 20 MG TABS tablet Take 1 tablet (20 mg total) by mouth daily. 30 tablet 11   Current Facility-Administered Medications  Medication Dose Route Frequency Provider Last Rate Last Dose  . methylPREDNISolone sodium succinate (SOLU-MEDROL) 1,000 mg in sodium  chloride 0.9 % 50 mL IVPB  1,000 mg Intravenous Q28 days Alda Berthold, DO 58 mL/hr at 01/27/18 0928 1,000 mg at 01/27/18 1700     REVIEW OF SYSTEMS:   Constitutional: Denies fevers, chills or night sweats Eyes: Denies blurriness of vision Ears, nose, mouth, throat, and face: Denies mucositis or sore throat Respiratory: Denies cough, dyspnea or wheezes Cardiovascular: Denies palpitation, chest discomfort or lower extremity swelling Gastrointestinal:  Denies nausea, heartburn or change in bowel habits Skin: Denies abnormal skin rashes Lymphatics: Denies new lymphadenopathy or easy bruising Neurological:Denies numbness, tingling or new weaknesses Behavioral/Psych: Mood is stable, no new changes  All other systems were reviewed with the patient and are negative.  PHYSICAL EXAMINATION: ECOG PERFORMANCE STATUS: 0 - Asymptomatic  Vitals:   06/29/18 1427  BP: 129/74  Pulse: 85  Resp: 18  Temp: 97.9 F (36.6 C)  SpO2: 99%   Filed Weights   06/29/18 1427  Weight: 184 lb (83.5 kg)    GENERAL:alert, no distress and comfortable SKIN: skin color, texture, turgor are normal, no rashes or significant lesions EYES: normal, Conjunctiva are pink and non-injected, sclera clear OROPHARYNX:no exudate, no erythema and lips, buccal mucosa, and tongue normal  NECK: supple, thyroid normal size, non-tender, without nodularity LYMPH:  no palpable lymphadenopathy in the cervical, axillary or inguinal LUNGS: clear to auscultation and percussion with normal breathing effort HEART: regular rate & rhythm and no murmurs and no lower extremity edema ABDOMEN:abdomen soft, non-tender and normal bowel sounds Musculoskeletal:no cyanosis of digits and no clubbing  NEURO: alert & oriented x 3 with fluent speech, no focal motor/sensory deficits  LABORATORY DATA:  I have reviewed the data as listed     Component Value Date/Time   NA 142 06/29/2018 1338   NA 140 05/13/2017 0956   NA 141 08/11/2013 1046    K 4.6 06/29/2018 1338   K 4.3 08/11/2013 1046   CL 107 06/29/2018 1338   CO2 29 06/29/2018 1338   CO2 27 08/11/2013 1046   GLUCOSE 111 (H) 06/29/2018 1338   GLUCOSE 102 08/11/2013 1046   BUN 9 06/29/2018 1338   BUN 11 05/13/2017 0956   BUN 10.5 08/11/2013 1046   CREATININE 1.15 (H) 06/29/2018 1338   CREATININE 0.99 10/22/2015 1159   CREATININE 1.0 08/11/2013 1046   CALCIUM 10.0 06/29/2018 1338   CALCIUM 9.5 08/11/2013 1046   PROT 7.3 06/29/2018 1338   PROT 7.3 05/13/2017 0956   PROT 7.3 08/11/2013 1046   ALBUMIN 3.7 06/29/2018 1338   ALBUMIN 4.1 11/10/2017 1444   ALBUMIN 3.4 (L) 08/11/2013 1046   AST 14 (L) 06/29/2018 1338   AST 15 08/11/2013 1046   ALT 17 06/29/2018 1338   ALT 13 08/11/2013 1046   ALKPHOS 124 06/29/2018 1338   ALKPHOS 112 08/11/2013 1046   BILITOT 0.6 06/29/2018 1338   BILITOT 0.6 05/13/2017 0956   BILITOT 0.73 08/11/2013 1046   GFRNONAA 51 (L) 06/29/2018 1338   GFRNONAA 63 10/22/2015 1159   GFRAA 59 (L) 06/29/2018 1338   GFRAA 73 10/22/2015 1159    No results found for: SPEP, UPEP  Lab Results  Component Value Date   WBC 8.7 06/29/2018   NEUTROABS 5.5 06/29/2018   HGB 13.0 06/29/2018   HCT 39.9 06/29/2018   MCV 93.2 06/29/2018   PLT 302 06/29/2018      Chemistry      Component Value Date/Time   NA 142 06/29/2018 1338   NA 140 05/13/2017 0956   NA 141 08/11/2013 1046   K 4.6 06/29/2018 1338   K 4.3 08/11/2013 1046   CL 107 06/29/2018 1338   CO2 29 06/29/2018 1338   CO2 27 08/11/2013 1046   BUN 9 06/29/2018 1338   BUN 11 05/13/2017 0956   BUN 10.5 08/11/2013 1046   CREATININE 1.15 (H) 06/29/2018 1338   CREATININE 0.99 10/22/2015 1159   CREATININE 1.0 08/11/2013 1046      Component Value Date/Time   CALCIUM 10.0 06/29/2018 1338   CALCIUM 9.5 08/11/2013 1046   ALKPHOS 124 06/29/2018 1338   ALKPHOS 112 08/11/2013 1046   AST 14 (L) 06/29/2018 1338   AST 15 08/11/2013 1046   ALT 17 06/29/2018 1338   ALT 13 08/11/2013 1046    BILITOT 0.6 06/29/2018 1338   BILITOT 0.6 05/13/2017 0956   BILITOT 0.73 08/11/2013 1046       I spent 10 minutes counseling the patient face to face. The total time spent in the appointment was 15 minutes and more than 50% was on counseling.   All questions were answered. The patient knows to call the clinic with any problems, questions or concerns. No barriers to learning was detected.    [name], MD 8/28/20192:31 PM

## 2018-07-01 NOTE — Telephone Encounter (Signed)
I called patient and left message requesting for her to call [facility] and gave her their #.

## 2018-07-02 NOTE — Telephone Encounter (Signed)
-----   Message from Lorel D Johnson sent at 07/02/2018 11:39 AM EDT -----  Regarding: Dr, Khawaja/telephone  General Message/Vendor Calls    Caller's first and last name: Jacquelyn Laury      Reason for call: pt requesting a call back in regards to whether paperwork has been received for her disability      Callback required yes/no and why: yes      Best contact number(s): [phone]      Details to clarify the request:      Zell D Johnson

## 2018-07-02 NOTE — Telephone Encounter (Signed)
-----   Message from Shary KeyAngela D Johnson sent at 07/02/2018 11:39 AM EDT -----  Regarding: Dr, Cristal FordKhawaja/telephone  General Message/Vendor Calls    Caller's first and last name: Verdene Rioangela Tomaselli      Reason for call: pt requesting a call back in regards to whether paperwork has been received for her disability      Callback required yes/no and why: yes      Best contact number(s): [phone]      Details to clarify the request:      Shary KeyAngela D Johnson

## 2018-07-06 NOTE — Telephone Encounter (Signed)
-----   Message from Flemington [name] sent at 07/06/2018 11:10 AM EDT -----  Regarding: Dr Wynonia Hazard telephone  General Message/Vendor Calls    Caller's first and last name: pt      Reason for call: to verify a fax was received      Callback required yes/no and why: yes      Best contact number(s): [phone]      Details to clarify the request:      Vanessa [name]

## 2018-07-14 NOTE — Telephone Encounter (Signed)
-----   Message from Frances Furbish sent at 07/14/2018  1:58 PM EDT -----  Regarding: Dr. Carlynn Purl: [phone]  Pt requesting a call back in regards to having the date corrected on a form that has to be approved by insurance company. Best contact [phone]

## 2018-07-16 NOTE — Telephone Encounter (Signed)
Returned call no answer. Left message to call me back to clarify message let patient know we sent paperwork back on 07/07/18.

## 2018-07-16 NOTE — Telephone Encounter (Signed)
-----   Message from Alvira Philipsenarda S Bacon sent at 07/16/2018 11:14 AM EDT -----  Regarding: Dr. Cristal FordKhawaja/Telephone  General Message/Vendor Calls    Caller's first and last name: Vanessa RioAngela [name] (pt)      Reason for call: Requesting call back in reference to status of forms to [facility] filled out & [facility]       Callback required yes/no and why: yes      Best contact number(s):[phone]      Details to clarify the request: Want to see Dr. Wynonia HazardKhawaja for 5mins of his time when coming to appt for Dr. Jettie PaganEpps on 07/19/18 1:40pm. Has questions.      Renarda Gaynelle ArabianS Bacon

## 2018-07-19 ENCOUNTER — Ambulatory Visit: Attending: Neurology | Primary: Family Medicine

## 2018-07-19 ENCOUNTER — Ambulatory Visit
Admit: 2018-07-19 | Discharge: 2018-07-19 | Payer: PRIVATE HEALTH INSURANCE | Attending: Neurology | Primary: Family Medicine

## 2018-07-19 DIAGNOSIS — F322 Major depressive disorder, single episode, severe without psychotic features: Secondary | ICD-10-CM

## 2018-07-19 NOTE — Progress Notes (Signed)
[facility] at [facility] Neurology Clinics at [facility] [address] [place], [place] [address]  [address] [place], [place] [address]   [phone]              Chief Complaint   Patient presents with   ??? Follow-up     testing with Dr. Wynonia Hazard     Current Outpatient Medications   Medication Sig Dispense Refill   ??? gabapentin (NEURONTIN) 100 mg capsule TAKE 1 CAPSULE BY MOUTH THREE TIMES A DAY (Patient taking differently: Take 100 mg by mouth nightly.) 90 Cap 5   ??? lidocaine-prilocaine (EMLA) topical cream Apply  to affected area as needed for Pain. 30 g 3   ??? CHOLECALCIFEROL, VITAMIN D3, PO Take 5,000 Int'l Units/day by mouth.     ??? buPROPion XL (WELLBUTRIN XL) 300 mg XL tablet Take 300 mg by mouth daily.  2   ??? losartan-hydroCHLOROthiazide (HYZAAR) 100-25 mg per tablet Take 1 Tab by mouth daily.     ??? escitalopram oxalate (LEXAPRO) 20 mg tablet Take 20 mg by mouth daily.     ??? minoxidil (ROGAINE) 2 % external solution Apply  to affected area two (2) times a day. Apply to scalp and rub it in     ??? meloxicam (MOBIC) 15 mg tablet Take 15 mg by mouth.     ??? VAYARIN 75-8.5-21.5 mg cap Take 1 Cap by mouth two (2) times a day.        Allergies   Allergen Reactions   ??? Amlodipine Other (comments)     Loss of memory,nervous,jettery   ??? Losartan Other (comments)     Nervous,jettery   ??? Nifedipine Other (comments)     Swelling,palpations     Social History     Tobacco Use   ??? Smoking status: Never Smoker   ??? Smokeless tobacco: Never Used   Substance Use Topics   ??? Alcohol use: No   ??? Drug use: No     Patient returns today for follow-up.  Her last visit with our nurse practitioner, Gregary Signs found that she had undergone EMG, EEG and carotid Doppler for symptoms of right-sided numbness.  These were all normal.  She was put on gabapentin 100 mg 3 times daily to help with the numbness and tingling.  She was offered PT but declined.  She  was sent to see Dr. Wynonia Hazard for formal neuropsychological evaluation.  This was compared to a previous study done in 2017 by another neuropsychologist.  This indicated a mild decline in cognitive functioning over time and she had quite severe deficits in attention as well as decline in visual memory.  Her auditory memory was normal and that led away from a dementing type diagnosis.  He felt that she had severe depression as well as severe anxiety related to her answers and questionnaires.  Diagnoses demonstrated depression anxiety and PTSD.  He did not think the profile was consistent with dementia but there was some worsening of a mild cognitive disorder and recommendation was made for amplified psychiatric involvement.  He did find that she was competent.  He would like to repeat the test in 6 months.    She is complaining of feeling anxious and jittery.  She initially tells me that she has no one taking care of her anxiety and depression and then after we discussed going to see Dr. Tami Ribas she tells me she does have  a psychiatrist and that the medicines are not working.    We discussed the test results and to the overall reassuring nature but the need to have them repeated in 6 months as noted.  I told her I will give her Dr. Mearl LatinSaleem's name and she wanted a second opinion.    She is also complaining of back pain and knee pain and I suggested she see an orthopedist    Have neuropsychological testing repeated in 6 months.    Unless there is some significant change in those test no neurologic follow-up needed    Total time: 15 min   Counseling / coordination time: 10 min   > 50% counseling / coordination?: Yes re: as documented        Examination  Visit Vitals  Ht 5\' 7"  (1.702 m)   Wt 68.9 kg (152 lb)   BMI 23.81 kg/m??         Impression/Plan      Vanessa ScotlandStacey L Domnique Vantine, MD      This note was created using voice recognition software. Despite editing, there may be syntax errors. This note will not be viewable in  MyChart.

## 2018-07-19 NOTE — Progress Notes (Signed)
[facility] at [facility] Neurology Clinics at [facility] [address] [place], [place] [address]  [address] [place], [place] [address]   [phone]              Chief Complaint   Patient presents with   ??? Follow-up     testing with Dr. Wynonia Hazard     Current Outpatient Medications   Medication Sig Dispense Refill   ??? gabapentin (NEURONTIN) 100 mg capsule TAKE 1 CAPSULE BY MOUTH THREE TIMES A DAY (Patient taking differently: Take 100 mg by mouth nightly.) 90 Cap 5   ??? lidocaine-prilocaine (EMLA) topical cream Apply  to affected area as needed for Pain. 30 g 3   ??? CHOLECALCIFEROL, VITAMIN D3, PO Take 5,000 Int'l Units/day by mouth.     ??? buPROPion XL (WELLBUTRIN XL) 300 mg XL tablet Take 300 mg by mouth daily.  2   ??? losartan-hydroCHLOROthiazide (HYZAAR) 100-25 mg per tablet Take 1 Tab by mouth daily.     ??? escitalopram oxalate (LEXAPRO) 20 mg tablet Take 20 mg by mouth daily.     ??? minoxidil (ROGAINE) 2 % external solution Apply  to affected area two (2) times a day. Apply to scalp and rub it in     ??? meloxicam (MOBIC) 15 mg tablet Take 15 mg by mouth.     ??? VAYARIN 75-8.5-21.5 mg cap Take 1 Cap by mouth two (2) times a day.        Allergies   Allergen Reactions   ??? Amlodipine Other (comments)     Loss of memory,nervous,jettery   ??? Losartan Other (comments)     Nervous,jettery   ??? Nifedipine Other (comments)     Swelling,palpations     Social History     Tobacco Use   ??? Smoking status: Never Smoker   ??? Smokeless tobacco: Never Used   Substance Use Topics   ??? Alcohol use: No   ??? Drug use: No     Patient returns today for follow-up.  Her last visit with our nurse practitioner, Gregary Signs found that she had undergone EMG, EEG and carotid Doppler for symptoms of right-sided numbness.  These were all normal.  She was put on gabapentin 100 mg 3 times daily to help with the numbness and  tingling.  She was offered PT but declined.  She was sent to see Dr. Wynonia Hazard for formal neuropsychological evaluation.  This was compared to a previous study done in 2017 by another neuropsychologist.  This indicated a mild decline in cognitive functioning over time and she had quite severe deficits in attention as well as decline in visual memory.  Her auditory memory was normal and that led away from a dementing type diagnosis.  He felt that she had severe depression as well as severe anxiety related to her answers and questionnaires.  Diagnoses demonstrated depression anxiety and PTSD.  He did not think the profile was consistent with dementia but there was some worsening of a mild cognitive disorder and recommendation was made for amplified psychiatric involvement.  He did find that she was competent.  He would like to repeat the test in 6 months.    She is complaining of feeling anxious and jittery.  She initially tells me that she has no one taking care of her anxiety and depression and then after we discussed going to see Dr. Tami Ribas she tells me she does have  a psychiatrist and that the medicines are not working.    We discussed the test results and to the overall reassuring nature but the need to have them repeated in 6 months as noted.  I told her I will give her Dr. Mearl LatinSaleem's name and she wanted a second opinion.    She is also complaining of back pain and knee pain and I suggested she see an orthopedist    Have neuropsychological testing repeated in 6 months.    Unless there is some significant change in those test no neurologic follow-up needed    Total time: 15 min   Counseling / coordination time: 10 min   > 50% counseling / coordination?: Yes re: as documented        Examination  Visit Vitals  Ht 5\' 7"  (1.702 m)   Wt 68.9 kg (152 lb)   BMI 23.81 kg/m??         Impression/Plan      Stanford ScotlandStacey L Mckade Gurka, MD      This note was created using voice recognition software. Despite editing,  there may be syntax errors. This note will not be viewable in MyChart.

## 2018-07-19 NOTE — Telephone Encounter (Signed)
Ms. Vanessa [name] was here seeing Dr. Jettie [name] today. Has some questions for you about disability paperwork wants to speak with you. Both Vanessa [name] and I tried to assist but she says you told her if she had any questions you would speak with her. [phone]. Thank You

## 2018-07-28 NOTE — Telephone Encounter (Signed)
-----   Message from Vanessa [name] sent at 07/28/2018  1:11 PM EDT -----  Regarding: Dr Cristal Ford  Pt is calling to see if she still needs another test done in December, trying to make sure she heard the doctor right, please call [phone].

## 2018-07-29 NOTE — Telephone Encounter (Signed)
-----   Message from Renato GailsKarla M Wright sent at 07/29/2018 11:46 AM EDT -----  Regarding: Dr. Lucianne LeiKwahaja/Telephone  Pt requesting a call back from provider in regards to appt report is deemed unclear. Pt wishes to only speak with the provider.  Best contact [phone].

## 2018-07-30 NOTE — Telephone Encounter (Signed)
Called Dr. Sherryll BurgerShah and had lengthy discussion with her.  The patient's depression and anxiety are not permanently disabling.  She may need some accommodations for work and perhaps out on the short term only until mood improves but needs to exercise her mind and body and she does not have a dementia at this point in time, in my opinion.  Dr. Margaretmary EddyShah's opinion is consistent with mine.

## 2018-07-30 NOTE — Telephone Encounter (Signed)
-----   Message from Lupe Carney sent at 07/29/2018  4:24 PM EDT -----  Regarding: Dr. Wynonia Hazard, [place]/Telephone   General Message/Vendor Calls    Caller's first and last name:  Dr. Sherryll Burger from [facility]     Reason for call: Dr. Sherryll Burger from [facility] is requesting a call back from Dr. Wynonia Hazard, she was told that she needs to contact Dr. Wynonia Hazard for the pt.      Callback required yes/no and why: Yes       Best contact number(s): [phone]      Details to clarify the request:

## 2018-08-02 NOTE — Telephone Encounter (Signed)
Spoke with patient let her know that you spoke with Dr. Sherryll Burger Friday also the other two providers she's speaking of didn't contact us. Patients requesting you call [facility] [phone] option 5 regarding her disability paperwork.

## 2018-08-02 NOTE — Telephone Encounter (Signed)
Patient called back again to give you the options to press for [facility]. She said option 5 then 1 then 5 again.

## 2018-08-02 NOTE — Telephone Encounter (Signed)
-----   Message from Alvira Philips sent at 07/30/2018  4:31 PM EDT -----  Regarding: Dr. Cristal Ford  General Message/Vendor Calls    Caller's first and last name: Vanessa [name] (pt)      Reason for call: Requesting a call back in reference to status of forms for disability. Stated that 3 other practices tried to reach out to Dr. Wynonia Hazard; no response. Claim will be denied due to no contact.      Callback required yes/no and why: yes      Best contact number(s):[phone]      Details to clarify the request:      Vanessa [name]

## 2018-08-04 NOTE — Telephone Encounter (Signed)
-----   Message from Renato GailsKarla M Wright sent at 08/04/2018 11:52 AM EDT -----  Regarding: Dr. Cristal FordKhawaja/Telephone  Pt requesting a call back in regards to testing that she was told would be scheduled in December pertaining to disability. Best contact [phone].

## 2018-08-05 NOTE — Telephone Encounter (Signed)
-----   Message from Renato GailsKarla M Wright sent at 08/05/2018  9:12 AM EDT -----  Regarding: Dr. Cristal FordKhawaja/Telephone  Pt requesting a call back in regards to having provider available to speak with her psychologist tomorrow 08/06/18 between 08-1029 am. Best contact [phone].

## 2018-08-06 NOTE — Telephone Encounter (Signed)
Would you be available to speak with her psychologist today between 10-10:30 am?

## 2018-08-10 NOTE — Telephone Encounter (Signed)
-----   Message from Renato Gails sent at 08/10/2018  9:30 AM EDT -----  Regarding: Dr. Cristal Ford  Pt requesting a call back in regards to scheduling a psych evaluation test in December. Best contact [phone].

## 2018-08-23 ENCOUNTER — Ambulatory Visit: Payer: Medicaid Other | Admitting: Neurology

## 2018-09-27 ENCOUNTER — Encounter: Attending: Clinical Neuropsychologist | Primary: Family Medicine

## 2018-09-27 ENCOUNTER — Ambulatory Visit: Payer: Medicaid Other | Admitting: Neurology

## 2018-09-27 ENCOUNTER — Encounter: Payer: Self-pay | Admitting: Neurology

## 2018-09-27 VITALS — BP 140/80 | HR 113 | Ht 62.0 in | Wt 184.2 lb

## 2018-09-27 DIAGNOSIS — G6181 Chronic inflammatory demyelinating polyneuritis: Secondary | ICD-10-CM

## 2018-09-27 DIAGNOSIS — G8929 Other chronic pain: Secondary | ICD-10-CM

## 2018-09-27 DIAGNOSIS — M25512 Pain in left shoulder: Secondary | ICD-10-CM

## 2018-09-27 DIAGNOSIS — M25511 Pain in right shoulder: Secondary | ICD-10-CM

## 2018-09-27 NOTE — Progress Notes (Signed)
Follow-up Visit   Date: 09/27/18    KATYE VALEK MRN: [id number] DOB: 1956-03-13   Interim History: Tammy [name] is a 62 y.o. 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  The patient was accompanied to the clinic by self.   History of present illness: She complains of numbness numbness and tingling of the hands which started about a year ago.  It involves her entire palmer side of the hands and both are affected equally. She also has cold sensation of the upper arms.  She has some weakness of her grip, such as holding a phone or opening jars.  Symptoms are worse at night time and often wake her up from sleeping.  She denies any chronic neck pain.  Currently, she takes gabapentin '300mg'$  three times daily and nortriptyline '25mg'$  , which does not provide any relief.  The only thing that provides some relief is applying steelwool on her hands to relieve some of the numbness.  She works as a Aeronautical engineer at [employer].  No prior history of diabetes or thyroid problems.  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 Because of clinical suspicion for CIDP, she had induction dose of IVMP and has noticed some improvement in her burning sensation.  Since then, however, she has new numbness over the right 5th digit. Over the past month, she has noticed painful cramps of the hands, which wakes her up from sleeping.   She is taking nortriptyline '25mg'$  at bedtime which makes her very sleepy.  She takes gabapentin '300mg'$  three times daily, but forgets to take it about twice per week. She denies any pain in the legs, imbalance, or weakness.  UPDATE 04/14/2018:  She is here for follow-up visit.  She has not been able to get medicaid, so did not get IVIG.  We have been  in contact with home infusion services who is waiting for medicaid number to process paperwork for IVIG.  Bilateral arms pain has been getting worse and her arm spasms/cramps are worse.  She denies any weakness or numbness/tingling of the legs.  She takes gabapentin '300mg'$  TID and nortriptyline '25mg'$  at bedtime with little relief.    UPDATE 09/27/2018:  She is here for follow-up visit.  She was approved for medicaid and started IVIG for the past three months.  She gets headaches with IVIG and takes ibuprofen.  Unfortunately, she has not noticed any marked improvement with her numbness/tingling of the hands.  Her hands continue to itch and tingle and despite being on gabapentin '1200mg'$ /d and nortriptyline 15mat bedtime, there is no improvement.  She does not feel that nortriptyline is helpful.  She complains moreso of bilateral shoulder "locks up", described as sharp pain when she tries to raise her arms above her shoulder. Pain is sometimes improved with ibuprofen.   Medications:  Current Outpatient Medications on File Prior to Visit  Medication Sig Dispense Refill  . atorvastatin (LIPITOR) 40 MG tablet Take 1 tablet (40 mg total) by mouth daily. 90 tablet 3  . clobetasol cream (TEMOVATE) 03.01% Apply 1 application topically 2 (two) times daily. 60 g 3  . gabapentin (NEURONTIN) 300 MG capsule Take 1 tablet in the morning, 1 tablet in the afternoon, and 2 tablet at bedtime 120 capsule 11  . hydrOXYzine (  ATARAX/VISTARIL) 25 MG tablet Take 1 tablet (25 mg total) by mouth 2 (two) times daily as needed. 30 tablet 2  . nortriptyline (PAMELOR) 25 MG capsule Take 1 capsule (25 mg total) by mouth at bedtime. 30 capsule 11  . rivaroxaban (XARELTO) 10 MG TABS tablet Take 1 tablet (10 mg total) by mouth daily. 90 tablet 11  . XARELTO 20 MG TABS tablet Take 1 tablet (20 mg total) by mouth daily. 30 tablet 11   Current Facility-Administered Medications on File Prior to Visit  Medication Dose Route Frequency Provider  Last Rate Last Dose  . methylPREDNISolone sodium succinate (SOLU-MEDROL) 1,000 mg in sodium chloride 0.9 % 50 mL IVPB  1,000 mg Intravenous Q28 days Alda Berthold, DO 58 mL/hr at 01/27/18 0928 1,000 mg at 01/27/18 4132    Allergies: No Known Allergies  Review of Systems:  CONSTITUTIONAL: No fevers, chills, night sweats, or weight loss.  EYES: No visual changes or eye pain ENT: No hearing changes.  No history of nose bleeds.   RESPIRATORY: No cough, wheezing and shortness of breath.   CARDIOVASCULAR: Negative for chest pain, and palpitations.   GI: Negative for abdominal discomfort, blood in stools or black stools.  No recent change in bowel habits.   GU:  No history of incontinence.   MUSCLOSKELETAL: +history of joint pain or swelling.  No myalgias.   SKIN: Negative for lesions, rash, and itching.   ENDOCRINE: Negative for cold or heat intolerance, polydipsia or goiter.   PSYCH:  No depression or anxiety symptoms.   NEURO: As Above.   Vital Signs:  BP 140/80   Pulse (!) 113   Ht '5\' 2"'$  (1.575 m)   Wt 184 lb 4 oz (83.6 kg)   SpO2 96%   BMI 33.70 kg/m   General Medical Exam:   General:  Well appearing, comfortable  Eyes/ENT: see cranial nerve examination.   Neck:   No carotid bruits. Respiratory:  Clear to auscultation, good air entry bilaterally.   Cardiac:  Regular rate and rhythm, no murmur.   Ext:  No edema, ROM with bilateral arm extension at the shoulder reproduces localized pain over the shoulder.   Neurological Exam: MENTAL STATUS including orientation to time, place, person, recent and remote memory, attention span and concentration, language, and fund of knowledge is normal.  Speech is not dysarthric.  CRANIAL NERVES:  Pupils equal round and reactive to light.  Normal conjugate, extra-ocular eye movements in all directions of gaze.  No ptosis.   Face is symmetric. Palate elevates symmetrically.  Tongue is midline.  MOTOR:  Motor strength is 5/5 in all extremities,  except intrinsic hand muscles 5-/5.  No atrophy, fasciculations or abnormal movements.  No pronator drift.  Tone is normal.    MSRs:  Reflexes are 2+/4 throughout  SENSORY:  Reduced sensation to pin prick and temperature over the palmer surface bilaterally.  Sensation in the legs is normal.   COORDINATION/GAIT:  Normal finger-to- nose-finger. Gait narrow based and stable.  Tandem gait intact.   Data: NCS/EMG of the arms 10/15/2017:  The electrophysiologic findings are most consistent with a subacute sensorimotor polyneuropathy, axon loss and demyelinating in type, affecting the upper extremities; these findings are moderate in degree electrically.  NCS/EMG of the legs 10/29/2017:  Normal  CSF 11/10/2017:  R1 W1 G75 P85*    IgG 0.64, MBP < 2, OCB + CSF and serum, cytology negative, ACE 4  Labs 10/29/2017:  ESR 72*, CRP <0.3, vitamin B12 434,  vitamin B1 7*, copper 153, folate 15.6, MMA 122, ANA 1:80 (speckled), SPEP with IFE no M protein, ACE 77*, heavy metal screen  MRI cervical spine 01/20/2018: 1. Stable mild right foraminal narrowing at C6-7. 2. Slight progression of uncovertebral disease and mild right foraminal narrowing at C5-6. 3. No focal cord signal abnormality or thickening of nerve roots.   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  In January 2019, she was given induction course of IV methylprednisolone '1mg'$  x 5 days due to insurance changes, did not get IVMP again until the summer.  In September 2019, she was started in IVIG x 3 cycles.  Unfortunately, she has not experienced improvement in hand paresthesias.  It was explained that it can take up to 6 months of IVIG therapy to appreciate any change.  I will continue her on IVIG 1g every 4 weeks for another 3 cycles.  If no benefit at her next visit, will plan to repeat NCS/EMG of the arms to reassess her  neuropathy Continue gabapentin '300mg'$  in the morning, '300mg'$  in the afternoon, and '600mg'$  at bedtime Discontinue nortriptyline '25mg'$  at bedtime due to no benefit Start OTC lidocaine ointment for severe hand paresthesias  Thiamine deficiency.  She does not drink alcohol. Continue thiamine '100mg'$ /d. Check level at the next visit  Bilateral shoulder pain, refer to Sports Medicine to evaluate for MSK source for her pain and limited ROM.  Return to clinic in 3 months  Greater than 50% of this 30 minute visit was spent in counseling, explanation of diagnosis, planning of further management, and coordination of care.    Thank you for allowing me to participate in patient's care.  If I can answer any additional questions, I would be pleased to do so.    Sincerely,    Quierra Silverio K. Posey Pronto, DO

## 2018-09-27 NOTE — Patient Instructions (Signed)
You can try lidocaine ointment to your hands to see if this helps with your tingling  Stop nortriptyline due to no benefit  Continue monthly IVIG  Referral to Sports Medicine  Return to clinic in 3 months

## 2018-09-28 ENCOUNTER — Other Ambulatory Visit: Payer: Self-pay | Admitting: *Deleted

## 2018-09-28 DIAGNOSIS — M5410 Radiculopathy, site unspecified: Secondary | ICD-10-CM

## 2018-09-28 DIAGNOSIS — R29898 Other symptoms and signs involving the musculoskeletal system: Secondary | ICD-10-CM

## 2018-09-28 DIAGNOSIS — M25512 Pain in left shoulder: Principal | ICD-10-CM

## 2018-09-28 DIAGNOSIS — R252 Cramp and spasm: Secondary | ICD-10-CM

## 2018-09-28 DIAGNOSIS — R292 Abnormal reflex: Secondary | ICD-10-CM

## 2018-09-28 DIAGNOSIS — M25511 Pain in right shoulder: Principal | ICD-10-CM

## 2018-09-28 DIAGNOSIS — G8929 Other chronic pain: Secondary | ICD-10-CM

## 2018-10-05 ENCOUNTER — Encounter: Payer: Self-pay | Admitting: Sports Medicine

## 2018-10-05 ENCOUNTER — Ambulatory Visit
Admission: RE | Admit: 2018-10-05 | Discharge: 2018-10-05 | Disposition: A | Discharge status: Home / Self Care | Payer: Medicaid Other | Source: Ambulatory Visit | Attending: Sports Medicine | Admitting: Sports Medicine

## 2018-10-05 ENCOUNTER — Ambulatory Visit: Payer: Medicaid Other | Admitting: Sports Medicine

## 2018-10-05 VITALS — BP 142/86 | Ht 62.0 in | Wt 184.0 lb

## 2018-10-05 DIAGNOSIS — M25512 Pain in left shoulder: Secondary | ICD-10-CM

## 2018-10-05 DIAGNOSIS — M25511 Pain in right shoulder: Secondary | ICD-10-CM

## 2018-10-05 DIAGNOSIS — M19012 Primary osteoarthritis, left shoulder: Secondary | ICD-10-CM | POA: Diagnosis not present

## 2018-10-05 NOTE — Progress Notes (Signed)
  Subjective:     Patient ID: Tammy [name], female   DOB: [birth year], 62 y.o.   MRN: [id number]  HPI Tammy [name] is a 62 year old female with a past medical history of CIDP who presents for bilateral shoulder pain for 1-1.5 weeks.  She is concerned today about her shoulders "locking up" which occurs ~2x/day along with numbness/tingling.  Her shoulders "locked up" today while she was driving, and she was able to continue driving to her destination.  She has bilateral shoulder pain as well, radiating down to her elbows and has numbness/tingling radiating down to all fingers.  She has treated her pain with occasional Aleve with no relief.  Tammy [name] has had similar symptoms previously in her R shoulder several years ago and was told she had "a bone pressing on my vein".  At the time, she did not have insurance and her symptoms resolved without treatment.  She states that no treatments tried previously have improved her pain symptoms including Prednisone or IVIG.    Review of Systems  Musculoskeletal: Positive for myalgias. Negative for falls.  Neurological: Positive for tingling, sensory change and weakness.      Objective:   Physical Exam   Well-developed, well-nourished.  No acute distress.  Awake alert and oriented x3.  Vital signs reviewed.  Right Shoulder Inspection: No rashes, erythema or swelling. Scapula is symmetric. Palpation: Generalized TTP with increased TTP to [facility] joint. Range of Motion: Active ROM limited by pain. Strength: 4/5 to flexion, extension, abduction and adduction. Exam limited by pain Special Tests: equivocal Empty Can and [place] Neurovascular Status: Bicep, triceps and brachioradialis reflexes 2+  Left Shoulder: Inspection: No rashes, erythema or swelling. Scapula is symmetric. Palpation: Generalized TTP with increased TTP to [facility] joint. Range of Motion: Active ROM limited by pain. Strength: 4/5 to flexion, extension, abduction and adduction. Exam limited by  pain Special Tests: equivocal Empty Can and [place] Neurovascular Status: Bicep, triceps and brachioradialis reflexes 2+    Assessment:     Tammy [name] is a 62 year old female with a past medical history of CIDP who presents for bilateral shoulder pain for 1-1.5 weeks.  Suspect elements of neuropathic pain from her CIDP as well as some rotator cuff tendinopathy.     Plan:     Will obtain bilateral shoulder x-rays to r/o bony pathology.  Tammy [name] will likely to benefit from physical therapy. She is scheduled to follow-up with Neurology in 3 months. Follow-up with [place] in one month.   Patient seen and evaluated with the medical student.  I agree with the above plan of care.  Patient does have physical exam findings consistent with rotator cuff tendinopathy.  X-rays of her shoulder are fairly unremarkable.  She does have some mild degenerative changes but no evidence of loose bodies.  I would like for her to start formal physical therapy and follow-up with me in 3 to 4 weeks.  If symptoms persist, consider merits of subacromial cortisone injection.  Patient is encouraged to call with questions or concerns in the interim.

## 2018-10-06 ENCOUNTER — Encounter: Payer: Self-pay | Admitting: Sports Medicine

## 2018-10-07 ENCOUNTER — Telehealth: Payer: Self-pay

## 2018-10-07 DIAGNOSIS — M25512 Pain in left shoulder: Principal | ICD-10-CM

## 2018-10-07 DIAGNOSIS — M25511 Pain in right shoulder: Secondary | ICD-10-CM

## 2018-10-07 NOTE — Telephone Encounter (Signed)
If pt calls back please tell her that the x-rays of her shoulder are normal. We would like for her to start physical therapy, the referral was placed. Please follow-up with Dr. Micheline Chapman in 4 weeks.

## 2018-10-12 ENCOUNTER — Encounter: Payer: Self-pay | Admitting: Internal Medicine

## 2018-10-12 ENCOUNTER — Other Ambulatory Visit: Payer: Self-pay | Admitting: Internal Medicine

## 2018-10-12 ENCOUNTER — Ambulatory Visit: Payer: Medicaid Other | Attending: Internal Medicine | Admitting: Internal Medicine

## 2018-10-12 VITALS — BP 130/84 | HR 104 | Temp 98.8°F | Resp 16 | Wt 185.4 lb

## 2018-10-12 DIAGNOSIS — E785 Hyperlipidemia, unspecified: Secondary | ICD-10-CM | POA: Insufficient documentation

## 2018-10-12 DIAGNOSIS — Z833 Family history of diabetes mellitus: Secondary | ICD-10-CM | POA: Insufficient documentation

## 2018-10-12 DIAGNOSIS — Z8249 Family history of ischemic heart disease and other diseases of the circulatory system: Secondary | ICD-10-CM | POA: Diagnosis not present

## 2018-10-12 DIAGNOSIS — Z836 Family history of other diseases of the respiratory system: Secondary | ICD-10-CM | POA: Insufficient documentation

## 2018-10-12 DIAGNOSIS — F411 Generalized anxiety disorder: Secondary | ICD-10-CM | POA: Insufficient documentation

## 2018-10-12 DIAGNOSIS — Z9889 Other specified postprocedural states: Secondary | ICD-10-CM | POA: Insufficient documentation

## 2018-10-12 DIAGNOSIS — F17210 Nicotine dependence, cigarettes, uncomplicated: Secondary | ICD-10-CM | POA: Insufficient documentation

## 2018-10-12 DIAGNOSIS — G4733 Obstructive sleep apnea (adult) (pediatric): Secondary | ICD-10-CM | POA: Diagnosis not present

## 2018-10-12 DIAGNOSIS — Z2821 Immunization not carried out because of patient refusal: Secondary | ICD-10-CM

## 2018-10-12 DIAGNOSIS — Z86718 Personal history of other venous thrombosis and embolism: Secondary | ICD-10-CM | POA: Insufficient documentation

## 2018-10-12 DIAGNOSIS — Z79899 Other long term (current) drug therapy: Secondary | ICD-10-CM | POA: Diagnosis not present

## 2018-10-12 DIAGNOSIS — Z86711 Personal history of pulmonary embolism: Secondary | ICD-10-CM | POA: Diagnosis not present

## 2018-10-12 DIAGNOSIS — Z7901 Long term (current) use of anticoagulants: Secondary | ICD-10-CM | POA: Diagnosis not present

## 2018-10-12 DIAGNOSIS — F17200 Nicotine dependence, unspecified, uncomplicated: Secondary | ICD-10-CM | POA: Diagnosis not present

## 2018-10-12 DIAGNOSIS — G6181 Chronic inflammatory demyelinating polyneuritis: Secondary | ICD-10-CM | POA: Insufficient documentation

## 2018-10-12 DIAGNOSIS — I10 Essential (primary) hypertension: Secondary | ICD-10-CM | POA: Insufficient documentation

## 2018-10-12 MED ORDER — AMLODIPINE BESYLATE 5 MG PO TABS: 5.0000 mg | ORAL_TABLET | Freq: Every day | ORAL | 3 refills | Status: DC

## 2018-10-12 MED FILL — AMLODIPINE BESYLATE 5 MG TA: 5 | 30 days supply | Qty: 30 | Fill #0

## 2018-10-12 MED FILL — hydrOXYzine HCL 25 MG TABS: 25 | 15 days supply | Qty: 30 | Fill #1

## 2018-10-12 MED FILL — XARELTO 20 MG TABLET: 20 | 30 days supply | Qty: 30 | Fill #1

## 2018-10-12 NOTE — Progress Notes (Signed)
Patient ID: [name], female    DOB: Jun 26, 1956  MRN: [id number]  CC: Follow-up (4 month )   Subjective: Tammy [name] is a 62 y.o. 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 Alvy [name] back in August.  Plan is to continue Xarelto lifelong.  Patient denies any bruising or bleeding on the medication.  Tob dep:  Still not ready to quit.    Elev BP: Elevated on last visit.  She has a home nurse who comes in to give her the IVIG infusion.  She notes that on the past several visit she was told that her blood pressure is elevated.  She limits salt in foods.  Very active at work - walks up flight of stairs several time a day during her shift.    HL: Reports compliance with Lipitor.  No muscle aches from the medication. Patient Active Problem List   Diagnosis Date Noted  . Tobacco dependence 06/15/2018  . CIDP (chronic inflammatory demyelinating polyneuropathy) ([place]) 01/12/2018  . Positive ANA (antinuclear antibody) 12/24/2015  . Generalized anxiety disorder 12/24/2015  . Dyslipidemia 10/09/2014  . Cervical spondylolysis 10/09/2014  . DVT (deep venous thrombosis) ([place]) 08/22/2013  . LGSIL (low grade squamous intraepithelial lesion) on Pap smear 08/04/2013  . Sleep apnea 05/21/2011     Current Outpatient Medications on File Prior to Visit  Medication Sig Dispense Refill  . atorvastatin (LIPITOR) 40 MG tablet Take 1 tablet (40 mg total) by mouth daily. 90 tablet 3  . clobetasol cream (TEMOVATE) 1.51 % Apply 1 application topically 2 (two) times daily. 60 g 3  . gabapentin (NEURONTIN) 300 MG capsule Take 1 tablet in the morning, 1 tablet in the afternoon, and 2 tablet at bedtime 120 capsule 11  . hydrOXYzine (ATARAX/VISTARIL) 25 MG tablet Take 1 tablet (25  mg total) by mouth 2 (two) times daily as needed. 30 tablet 2  . nortriptyline (PAMELOR) 25 MG capsule Take 1 capsule (25 mg total) by mouth at bedtime. 30 capsule 11  . rivaroxaban (XARELTO) 10 MG TABS tablet Take 1 tablet (10 mg total) by mouth daily. 90 tablet 11  . XARELTO 20 MG TABS tablet Take 1 tablet (20 mg total) by mouth daily. 30 tablet 11   Current Facility-Administered Medications on File Prior to Visit  Medication Dose Route Frequency Provider Last Rate Last Dose  . methylPREDNISolone sodium succinate (SOLU-MEDROL) 1,000 mg in sodium chloride 0.9 % 50 mL IVPB  1,000 mg Intravenous Q28 days Tammy Berthold, DO 58 mL/hr at 01/27/18 0928 1,000 mg at 01/27/18 7616    No Known Allergies  Social History   Socioeconomic History  . Marital status: Single    Spouse name: Not on file  . Number of children: 4  . Years of education: Not on file  . Highest education level: Not on file  Occupational History  . Occupation: unemployed  Social Needs  . Financial resource strain: Not on file  . Food insecurity:    Worry: Not on file    Inability: Not on file  . Transportation needs:    Medical: Not on file    Non-medical: Not on file  Tobacco Use  . Smoking status: Light Tobacco Smoker    Packs/day: 0.00  Years: 40.00    Pack years: 0.00    Types: Cigarettes  . Smokeless tobacco: Never Used  . Tobacco comment: Patient reports smoking 2-3 cigarettes a day   Substance and Sexual Activity  . Alcohol use: No  . Drug use: No  . Sexual activity: Yes  Lifestyle  . Physical activity:    Days per week: 7 days    Minutes per session: 110 min  . Stress: Very much  Relationships  . Social connections:    Talks on phone: More than three times a week    Gets together: More than three times a week    Attends religious service: Never    Active member of club or organization: No    Attends meetings of clubs or organizations: Never    Relationship status: Divorced  . Intimate  partner violence:    Fear of current or ex partner: No    Emotionally abused: No    Physically abused: No    Forced sexual activity: No  Other Topics Concern  . Not on file  Social History Narrative   Lives alone in a one story home.  Has 4 children.  Works as a Education officer, environmental at [employer].  Education: GED   Recent travel to [place] 01/2011    Family History  Problem Relation Age of Onset  . Emphysema Father   . Breast cancer Mother   . Breast cancer Maternal Grandmother   . Heart disease Sister   . Cancer Sister   . Diabetes Brother     Past Surgical History:  Procedure Laterality Date  . CESAREAN SECTION     one previous  . right arm fracture    . TUBAL LIGATION      ROS: Review of Systems Negative except as above.  PHYSICAL EXAM: BP 130/84   Pulse (!) 104   Temp 98.8 F (37.1 C) (Oral)   Resp 16   Wt 185 lb 6.4 oz (84.1 kg)   SpO2 97%   BMI 33.91 kg/m   Repeat BP 145/80 Physical Exam  General appearance - alert, well appearing, and in no distress Mental status - normal mood, behavior, speech, dress, motor activity, and thought processes Neck - supple, no significant adenopathy Chest - clear to auscultation, no wheezes, rales or rhonchi, symmetric air entry Heart - normal rate, regular rhythm, normal S1, S2, no murmurs, rubs, clicks or gallops Extremities - peripheral pulses normal, no pedal edema, no clubbing or cyanosis   ASSESSMENT AND PLAN:  1. Essential hypertension Given blood pressure is not at goal today, elevated on home blood pressure readings taken by a visiting nurse and also noted to be elevated on last 2 visits with other physicians, I recommend starting amlodipine 5 mg daily.  DASH diet discussed and encouraged - amLODipine (NORVASC) 5 MG tablet; Take 1 tablet (5 mg total) by mouth daily.  Dispense: 90 tablet; Refill: 3  2. Tobacco dependence Advised to quit.  Patient aware of health risks associated with tobacco use.  She is  not ready to quit as yet.  3. Dyslipidemia - Lipid panel - Hepatic Function Panel  4. Influenza vaccination declined  5. CIDP (chronic inflammatory demyelinating polyneuropathy) ([place]) Followed and treated by neurology.   Patient was given the opportunity to ask questions.  Patient verbalized understanding of the plan and was able to repeat key elements of the plan.   Orders Placed This Encounter  Procedures  . Lipid panel  . Hepatic  Function Panel     Requested Prescriptions   Signed Prescriptions Disp Refills  . amLODipine (NORVASC) 5 MG tablet 90 tablet 3    Sig: Take 1 tablet (5 mg total) by mouth daily.    Return in about 4 months (around 02/11/2019).  Karle Plumber, MD, FACP

## 2018-10-12 NOTE — Patient Instructions (Addendum)
Your blood pressure is not at goal of 130/80 or lower.  Continue to limit salt in the foods.  We have started you on a blood pressure medication called amlodipine that she will take once a day.  Please write down your blood pressure readings each time that your home nurse checks it for you.  Do not forget to return to the lab fasting to have your blood test done.

## 2018-11-30 MED FILL — AMLODIPINE BESYLATE 5 MG TA: 5 | 30 days supply | Qty: 30 | Fill #1

## 2018-11-30 MED FILL — XARELTO 20 MG TABLET: 20 | 30 days supply | Qty: 30 | Fill #2

## 2018-11-30 MED FILL — hydrOXYzine HCL 25 MG TABS: 25 | 15 days supply | Qty: 30 | Fill #2

## 2018-12-07 NOTE — Telephone Encounter (Signed)
-----  Message from Nicole Cella sent at 12/07/2018  1:01 PM EST -----  Regarding: Dr. Karsten Ro  General Message/Vendor Calls    Caller's first and last name: Vanessa [name]      Reason for call:cost of neuro psych test      Callback required yes/no and why:yes      Best contact number(s):[phone]      Details to clarify the request:Pt stated she have not met her deductible and would like to know the cost of the neuro psych test schedule for 12/22/18.      Nicole Cella

## 2018-12-08 ENCOUNTER — Ambulatory Visit: Attending: Neurology | Primary: Family Medicine

## 2018-12-08 ENCOUNTER — Ambulatory Visit
Admit: 2018-12-08 | Discharge: 2018-12-08 | Payer: PRIVATE HEALTH INSURANCE | Attending: Neurology | Primary: Family Medicine

## 2018-12-08 DIAGNOSIS — R6889 Other general symptoms and signs: Secondary | ICD-10-CM

## 2018-12-08 NOTE — Progress Notes (Signed)
 Chief Complaint   Patient presents with   . Fall     states knees keep jumping out of socket which is causing issues with falling   . Leg Pain     ache and throb all the time   . Tremors     shaking in hands, severe arm pain, feels jittery all on inside

## 2018-12-08 NOTE — Progress Notes (Signed)
[facility] at [facility] [address], [place] [address]  [address] [place], [place] [address]   [phone]              Chief Complaint   Patient presents with   ??? Fall     states knees "keep jumping out of socket" which is causing issues with falling   ??? Leg Pain     ache and throb all the time   ??? Tremors     Current Outpatient Medications   Medication Sig Dispense Refill   ??? omega-3 acid ethyl esters (LOVAZA) 1 gram capsule TAKE 1 CAPSULE BY MOUTH EVERY DAY     ??? donepeziL (ARICEPT) 5 mg tablet Take 5 mg by mouth nightly.     ??? gabapentin (NEURONTIN) 100 mg capsule TAKE 1 CAPSULE BY MOUTH THREE TIMES A DAY (Patient taking differently: Take 100 mg by mouth nightly.) 90 Cap 5   ??? lidocaine-prilocaine (EMLA) topical cream Apply  to affected area as needed for Pain. 30 g 3   ??? meloxicam (MOBIC) 15 mg tablet Take 15 mg by mouth.     ??? buPROPion XL (WELLBUTRIN XL) 300 mg XL tablet Take 300 mg by mouth daily.  2   ??? losartan-hydroCHLOROthiazide (HYZAAR) 100-25 mg per tablet Take 1 Tab by mouth daily.     ??? escitalopram oxalate (LEXAPRO) 20 mg tablet Take 20 mg by mouth daily.     ??? CHOLECALCIFEROL, VITAMIN D3, PO Take 5,000 Int'l Units/day by mouth.     ??? minoxidil (ROGAINE) 2 % external solution Apply  to affected area two (2) times a day. Apply to scalp and rub it in     ??? VAYARIN 75-8.5-21.5 mg cap Take 1 Cap by mouth two (2) times a day.        Allergies   Allergen Reactions   ??? Amlodipine Other (comments)     Loss of memory,nervous,jettery   ??? Losartan Other (comments)     Nervous,jettery   ??? Nifedipine Other (comments)     Swelling,palpations     Social History     Tobacco Use   ??? Smoking status: Never Smoker   ??? Smokeless tobacco: Never Used   Substance Use Topics   ??? Alcohol use: No   ??? Drug use: No     Patient returns today for follow-up.  She is a 63 year old lady who I  last saw September 2019.  She had complaints of cognitive difficulty and we sent her for thorough evaluation and noted she does not have dementia but mild cognitive impairment.  She was to repeat her neuropsychological evaluation in 6 months.  She was also having some anxiety and we gave her referral to psychiatry.  I suggested she see an orthopedist secondary to back pain and knee pain.  She was discussed between Dr. Wynonia Hazard and Dr. Sherryll Burger regarding her anxiety per a telephone note.  She was at the time of our last visit relegated PRN follow-up.  She comes today reporting multiple somatic complaints.  She is complaining of her knees buckling.  Complains of feeling jittery on the inside.  She complains of her skin hurting.  She complains of multifocal aches and pains.  We had a nice discussion today regarding all the normal test we did including her EMG, EEG, carotid Doppler, MRI of the cervical spine, etc.  We discussed that  I do not have a neurologic reason for her complaints.  I encouraged her to continue to work to get her anxiety under control.  She asked what doctors to see for the knees and back etc.  I again referred her back to see her orthopedist at [facility].  She asked about the other aches and pains etc. and I asked her to discuss with Dr. Sherryll Burger whether rheumatology evaluation would be in order.  From my standpoint no neurologic follow-up needed.    Total time: 25 min   Counseling / coordination time: 20 min   > 50% counseling / coordination?: Yes re: as documented above        Examination  Visit Vitals  BP (!) 140/94 (BP 1 Location: Left arm, BP Patient Position: Sitting)   Pulse 78   Resp 18   Ht 5\' 7"  (1.702 m)   Wt 68.9 kg (152 lb)   SpO2 97%   BMI 23.81 kg/m??         Impression/Plan      Stanford Scotland, MD      This note was created using voice recognition software. Despite editing, there may be syntax errors. This note will not be viewable in MyChart.

## 2018-12-08 NOTE — Progress Notes (Signed)
[facility]  [facility]    [facility]  [address] [place], [place] [id number]  [address] [place], [place] [id number]   [phone]              Chief Complaint   Patient presents with   ??? Fall     states knees "keep jumping out of socket" which is causing issues with falling   ??? Leg Pain     ache and throb all the time   ??? Tremors     Current Outpatient Medications   Medication Sig Dispense Refill   ??? omega-3 acid ethyl esters (LOVAZA) 1 gram capsule TAKE 1 CAPSULE BY MOUTH EVERY DAY     ??? donepeziL (ARICEPT) 5 mg tablet Take 5 mg by mouth nightly.     ??? gabapentin (NEURONTIN) 100 mg capsule TAKE 1 CAPSULE BY MOUTH THREE TIMES A DAY (Patient taking differently: Take 100 mg by mouth nightly.) 90 Cap 5   ??? lidocaine-prilocaine (EMLA) topical cream Apply  to affected area as needed for Pain. 30 g 3   ??? meloxicam (MOBIC) 15 mg tablet Take 15 mg by mouth.     ??? buPROPion XL (WELLBUTRIN XL) 300 mg XL tablet Take 300 mg by mouth daily.  2   ??? losartan-hydroCHLOROthiazide (HYZAAR) 100-25 mg per tablet Take 1 Tab by mouth daily.     ??? escitalopram oxalate (LEXAPRO) 20 mg tablet Take 20 mg by mouth daily.     ??? CHOLECALCIFEROL, VITAMIN D3, PO Take 5,000 Int'l Units/day by mouth.     ??? minoxidil (ROGAINE) 2 % external solution Apply  to affected area two (2) times a day. Apply to scalp and rub it in     ??? VAYARIN 75-8.5-21.5 mg cap Take 1 Cap by mouth two (2) times a day.        Allergies   Allergen Reactions   ??? Amlodipine Other (comments)     Loss of memory,nervous,jettery   ??? Losartan Other (comments)     Nervous,jettery   ??? Nifedipine Other (comments)     Swelling,palpations     Social History     Tobacco Use   ??? Smoking status: Never Smoker   ??? Smokeless tobacco: Never Used   Substance Use Topics   ??? Alcohol use: No   ??? Drug use: No      Patient returns today for follow-up.  She is a 63 year old lady who I last saw September 2019.  She had complaints of cognitive difficulty and we sent her for thorough evaluation and noted she does not have dementia but mild cognitive impairment.  She was to repeat her neuropsychological evaluation in 6 months.  She was also having some anxiety and we gave her referral to psychiatry.  I suggested she see an orthopedist secondary to back pain and knee pain.  She was discussed between Dr. Wynonia HazardKhawaja and Dr. Sherryll BurgerShah regarding her anxiety per a telephone note.  She was at the time of our last visit relegated PRN follow-up.  She comes today reporting multiple somatic complaints.  She is complaining of her knees buckling.  Complains of feeling jittery on the inside.  She complains of her skin hurting.  She complains of multifocal aches and pains.  We had a nice discussion today regarding all the normal test we did including her EMG, EEG, carotid Doppler, MRI of the cervical spine, etc.  We discussed that  I do not have a neurologic reason for her complaints.  I encouraged her to continue to work to get her anxiety under control.  She asked what doctors to see for the knees and back etc.  I again referred her back to see her orthopedist at [facility].  She asked about the other aches and pains etc. and I asked her to discuss with Dr. Sherryll Burger whether rheumatology evaluation would be in order.  From my standpoint no neurologic follow-up needed.    Total time: 25 min   Counseling / coordination time: 20 min   > 50% counseling / coordination?: Yes re: as documented above        Examination  Visit Vitals  BP (!) 140/94 (BP 1 Location: Left arm, BP Patient Position: Sitting)   Pulse 78   Resp 18   Ht 5\' 7"  (1.702 m)   Wt 68.9 kg (152 lb)   SpO2 97%   BMI 23.81 kg/m??         Impression/Plan      Stanford Scotland, MD      This note was created using voice recognition software. Despite editing,  there may be syntax errors. This note will not be viewable in MyChart.

## 2018-12-08 NOTE — Progress Notes (Signed)
Chief Complaint   Patient presents with   ??? Fall     states knees "keep jumping out of socket" which is causing issues with falling   ??? Leg Pain     ache and throb all the time   ??? Tremors     shaking in hands, severe arm pain, feels jittery all on inside

## 2018-12-22 ENCOUNTER — Ambulatory Visit: Attending: Clinical Neuropsychologist | Primary: Family Medicine

## 2018-12-22 ENCOUNTER — Ambulatory Visit
Admit: 2018-12-22 | Discharge: 2018-12-22 | Payer: PRIVATE HEALTH INSURANCE | Attending: Clinical Neuropsychologist | Primary: Family Medicine

## 2018-12-22 DIAGNOSIS — G3184 Mild cognitive impairment, so stated: Secondary | ICD-10-CM

## 2018-12-22 NOTE — Progress Notes (Signed)
[facility]  [facility]   [address]   [place], [place] [id number]   [phone] Office   [phone] Fax      Neuropsychology    Exam # 2    Initial Diagnostic Interview Note      Referral:  Vanessa RightBanerjee, [name], MD, Dr. Mora BellmanEpps    Vanessa [name] is a 63 y.o. right handed married African American female who was unaccompanied to the initial clinical interview on 12/22/18 .  Please refer to her medical records for details pertaining to her history.      At the start of the appointment, I reviewed the patient's [facility] Epic Chart (including Media scanned in from previous providers) for the active Problem List, all pertinent Past Medical Hx, medications, recent radiologic and laboratory findings.  In addition, I reviewed pt's documented Immunization Record and Encounter History.       When I saw her last:    Vanessa [name] is a 63 y.o. right handed married African American female who was unaccompanied to the initial clinical interview on 04/16/18 .  Please refer to her medical records for details pertaining to her history.  Briefly, the patient reported that she completed two years of college without history of previously diagnosed LD and/or receipt of special education services.  Stopped working June 3 and she has applied for long term disability.  She has been dealing with a progressive decline in short term memory.  Forgets the content of conversations. Misplaces things. Has to write everything down.  Starts tasks and does not complete. Loses words.  Loses train of thought.  Loses two pairs of glasses and three diamond rings in the past two months.  Slower to learn and comprehend new things.  No history of stroke, meningitis/encephalitis, RMS Fever, Lupus, Lyme, TBI, sz.  She gets headaches when she is really tired.  Her mother had dementia and was showing memory problems in her 6260s and died at age 62.  Patient has not noticed no  major changes in her sense of taste or smell.  She has missed exits when driving and avoids driving at night or with heavy traffic.  No accidents or near misses.  She is fairly independent for medications but keeps notes, and same with finances.  She is independent for her ADLs.  Sleep is poor, and has been poor for quite some time.  She does get frequent headaches and is tired a lot which makes the headaches worse, too. Appetite is variable, but has increased.  She has been on different medications for depression and anxiety for a number of years now and is currently on Wellbutrin and Lexapro.  She is seeing Dr. Roddie Mchomas-Wilson for psychotherapy and continues to engage in same.  She has noticed no major improvements there and cognition is declining.  However, the medications do help with panic attacks, which are less frequent now.  She has having a lot more panic attacks when working (2-3x a week).  No falls but loses her balance on occasion.  She is engaged in church activities.  Marriage is going well and family notices the cognitive decline and are noted.    ??  I also reviewed job performance reviews and improvement plans and cognitive issues are noted here and are noted as ongoing and worsening despite attempts and understanding and moves towards improved job performance.    ??  Date: ??02/01/2018    Requesting Physician: ??Stacey L. Epps,  MD    An EEG is requested in this 63 year-old woman to evaluate for   epileptiform abnormalities. ??    Medications: ??Medications are listed as Lexapro, Diovan, calcium.   ??    This tracing is obtained during the awake state. ??During   wakefulness, there are brief intermittent runs of   posteriorly-dominant and symmetrical low-to-medium amplitude 9   cycle per second activities which attenuate with eye opening. ??  Lower-voltage faster-frequency activities are seen symmetrically   over the anterior head regions.    Hyperventilation is not performed due to age and medical history.    ??    Intermittent photic stimulation induces symmetric posterior   driving responses. ??    Sleep is not attained. ??    Interpretation: ??This EEG recorded during the awake state is   normal. ??No epileptiform abnormalities are seen.   ??  ??  Result Information   ??  Status: Final result (Exam End: 11/22/2017 17:12) Provider Status: Open   Study Result   ??  EXAM: ??MRI BRAIN W WO CONT  INDICATION: ??mild cognitive impairment, G 31.84, memory loss, blackouts, sharp  pain in shoulders and needles in arms and legs worse on right side.  TECHNIQUE: Sagittal and axial T1 weighted images were obtained followed by  intravenous infusion 13 mL Dotarem and axial FLAIR and T-2 weighted images.  Delayed axial and coronal postgadolinium T1-weighted images were obtained as  well as sagittal FLAIR images and axial diffusion weighted images.  COMPARISON: MRI of the head 04/08/13, CT 02/16/13  FINDINGS:  The ventricular size and configuration are normal.   Scattered small foci of T2 hyperintensity again demonstrated in the cerebral  white matter most prominent in the left frontal lobe without significant change  since the MRI 04/08/13. No associated abnormal enhancement or restricted  diffusion.  There is no evidence of intracranial hemorrhage, infarct, mass or abnormal  extra-axial fluid collections.   Flow voids are present in the vertebral, basilar and carotid artery systems. ??  The craniocervical junction is unremarkable.   The other structures of the cranial base including paranasal sinuses are   unremarkable.  ??  IMPRESSION  IMPRESSION:   1. Chronic nonspecific foci of T2 hyperintensity cerebral white matter without  significant change since 04/08/13.  2. No acute intracranial abnormality demonstrated..  ??   ??  ??  ??  Neuropsych reviewed from 2018.   This is a lengthy report and history reported is consistent with history reported here.  Concerns for MCI with mood issues raised and now concern is for a dementia versus pseudodementia.     ??      Dx then included:    This patient has now undergone two Neuropsychological Evaluations (previously by another provider in 2017 and the incident evaluation with me). She was previously diagnosed with Mild Cognitive Impairment and there was significant psychiatric overlay.  The current test results indicate a mild DECLINE in cognitive functioning over time, with significant problems noted in terms of severe deficits in attention (previously moderate) and significant (moderate to severe) decline in visual memory.  Interestingly, her auditory memory is fully normal, which leads away from a dementia type diagnosis.  She is also showing borderline range problems with working memory and processing speed, as well as problems with bilateral motor skills.  Executive functioning, perceptual reasoning, verbal comprehension, verbal fluency, and confrontation naming remain normal.  From an emotional standpoint, she reports severe depression and severe anxiety on two brief self-report  questionnaires.  Lengthier assessment of personality functioning could not be further interpreted due to item confusion/comprehension and consistency problems.  She previously generated a valid PAI which showed depression, anxiety, and PTSD.  Ongoing PTSD issues are likely.  ??              While the current profile not consistent with dementia, there is sufficient evidence to suggest a worsening mild cognitive disorder with significant psychiatric overlay. Consideration for psychiatric medication management for marked attention problems is suggested, along with psychiatric treatment and intensive psychotherapy for severe mood problems.  Consider EMDR.  At this point, her problems are worsening, which is showing an increased impact on vocational functioning, especially on any task requiring attention and/or memory, as well as mild issues with motor skills.  Additionally, stress at work will exacerbate the underlying cognitive deficits. I see  no evidence which would suggest malingering  I suggest short term disability be considered so as for her to fully engage with psychiatric and psychologic treatment, with a hopeful reduction in her cognitive difficulties pending successful psychiatric intervention.  Her prognosis is currently guarded, especially as a decline is seen over time.  I find her competent currently, but she needs supervision for medications and finances, and I have concerns for driving safety at this time.  We now have baseline and updated data on her.  Follow up in six months or prn, especially if there are any declines.  Clinical correlation is, of course, indicated.    ??              I will discuss these findings with the patient when she follows up with me in the near future.  A follow up Neuropsychological Evaluation is indicated on a prn basis, especially if there are any cognitive and/or emotional changes.    ??  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 Ginny Forth) and has been working from October 1 to December on working on cognitive functioning.  She can't remember things.  She has lost her ability to spell every day common words.  She can't complete a sentence of find the right words.  She has coordination and motor skills issues.  Her neck blades feel like  they are on fire.  She can't put water on her upper back or put on clothes because her skin feels raw.  She is followed by psychiatry.  She is participating in medication ([facility]), and helps with pain management.  She has had increased falls.   Her memory has been getting worse.  IT was suggest she see  ortho due to back pain and knee pain.  Her knees have been buckling.  She feels very jittery. Her skin hurts.  I do note that her EMG, EEG, carotid Doppler, MRI of the cervical spine, were normal.  Considering rheumatology. Showing up to wrong places when driving.  She has been feeling off.  She was supposed to see Dr. Sherryll Burger in the city and ended up here in our parking lot 30 minutes early, took 10 minutes to realize and 20 minutes to try and get to the city.  Remains independent for her ADLs.  Reminders for medications and finances.  See updated medication list below.  No new known stroke, meningitis/encephalitis, RMS Fever, Lupus, Lyme, TBI, sz, etc.  Her anxiety rises easily, especially with all the things she tries to get done so she can be on times to meetings such as these.  She gets more neurocognitively discombobulated the more anxious she is, and thus has restricted her activities.  She doesn't want to engage in conversations with others because she loses words or train of thought.      Neuropsychological Mental Status Exam (NMSE):  Historian: Good  Praxis: No UE apraxia  R/L Orientation: Intact to self and to other  Dress: within normal limits   Weight: within normal limits   Appearance/Hygiene: within normal limits   Gait: within normal limits   Assistive Devices: Glasses  Mood: Mildly depressed  Affect: Mildly flat  Comprehension: within normal limits   Thought Process: within normal limits   Expressive Language: within normal limits   Receptive Language: within normal limits   Motor:  No cognitive or motor perseveration  ETOH: Denied  Tobacco: Denied  Illicit: Denied  SI/HI: Denied current, but when things were really bad a year or so ago she had passive thoughts.  No plan, intent, or attempts.  She has been previously psychiatrically hospitalized at [facility]    Psychosis: Denied  Insight: Within normal limits  Judgment: Within normal limits  Other Psych:      Past Medical History:   Diagnosis Date    ??? Depression    ??? H/O colonoscopy 09-24-12    diverticulosis   ??? Hepatic steatosis    ??? Hypertension    ??? 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 Normal cardiac stress test 02-10-12   ??? Pneumonia        Past Surgical History:   Procedure Laterality Date   ??? HX GYN      tubal ligation       Allergies   Allergen Reactions   ??? Amlodipine Other (comments)     Loss of memory,nervous,jettery   ??? Losartan Other (comments)     Nervous,jettery   ??? Nifedipine Other (comments)     Swelling,palpations       Family History   Problem Relation Age of Onset   ??? Heart Disease Mother    ??? Heart Disease Father        Social History     Tobacco Use   ??? Smoking status: Never  Smoker   ??? Smokeless tobacco: Never Used   Substance Use Topics   ??? Alcohol use: No   ??? Drug use: No       Current Outpatient Medications   Medication Sig Dispense Refill   ??? omega-3 acid ethyl esters (LOVAZA) 1 gram capsule TAKE 1 CAPSULE BY MOUTH EVERY DAY     ??? donepeziL (ARICEPT) 5 mg tablet Take 5 mg by mouth nightly.     ??? gabapentin (NEURONTIN) 100 mg capsule TAKE 1 CAPSULE BY MOUTH THREE TIMES A DAY (Patient taking differently: Take 100 mg by mouth nightly.) 90 Cap 5   ??? lidocaine-prilocaine (EMLA) topical cream Apply  to affected area as needed for Pain. 30 g 3   ??? CHOLECALCIFEROL, VITAMIN D3, PO Take 5,000 Int'l Units/day by mouth.     ??? meloxicam (MOBIC) 15 mg tablet Take 15 mg by mouth.     ??? buPROPion XL (WELLBUTRIN XL) 300 mg XL tablet Take 300 mg by mouth daily.  2   ??? losartan-hydroCHLOROthiazide (HYZAAR) 100-25 mg per tablet Take 1 Tab by mouth daily.     ??? escitalopram oxalate (LEXAPRO) 20 mg tablet Take 20 mg by mouth daily.     ??? minoxidil (ROGAINE) 2 % external solution Apply  to affected area two (2) times a day. Apply to scalp and rub it in     ??? VAYARIN 75-8.5-21.5 mg cap Take 1 Cap by mouth two (2) times a day.           Plan:  Obtain authorization for  testing from insurance company.  Report to follow once testing, scoring, and interpretation completed.  ? Organic based neurocognitive issues versus mood disorder or combination of same.  ? Problems organic, functional, or both? This note will not be viewable in MyChart.

## 2018-12-22 NOTE — Progress Notes (Signed)
[facility]  [facility]   [address]   [place], [place] [id number]   [phone] Office   [phone] Fax      Neuropsychology    Exam # 2    Initial Diagnostic Interview Note      Referral:  Vanessa [name], [name], MD, Dr. Mora BellmanEpps    Vanessa [name] is a 63 y.o. right handed married African American female who was unaccompanied to the initial clinical interview on 12/22/18 .  Please refer to her medical records for details pertaining to her history.      At the start of the appointment, I reviewed the patient's [facility] Epic Chart (including Media scanned in from previous providers) for the active Problem List, all pertinent Past Medical Hx, medications, recent radiologic and laboratory findings.  In addition, I reviewed pt's documented Immunization Record and Encounter History.       When I saw her last:    Vanessa [name] is a 63 y.o. right handed married African American female who was unaccompanied to the initial clinical interview on 04/16/18 .  Please refer to her medical records for details pertaining to her history.  Briefly, the patient reported that she completed two years of college without history of previously diagnosed LD and/or receipt of special education services.  Stopped working June 3 and she has applied for long term disability.  She has been dealing with a progressive decline in short term memory.  Forgets the content of conversations. Misplaces things. Has to write everything down.  Starts tasks and does not complete. Loses words.  Loses train of thought.  Loses two pairs of glasses and three diamond rings in the past two months.  Slower to learn and comprehend new things.  No history of stroke, meningitis/encephalitis, RMS Fever, Lupus, Lyme, TBI, sz.  She gets headaches when she is really tired.  Her mother had dementia and was showing memory problems in her 5460s and died at age  62.  Patient has not noticed no major changes in her sense of taste or smell.  She has missed exits when driving and avoids driving at night or with heavy traffic.  No accidents or near misses.  She is fairly independent for medications but keeps notes, and same with finances.  She is independent for her ADLs.  Sleep is poor, and has been poor for quite some time.  She does get frequent headaches and is tired a lot which makes the headaches worse, too. Appetite is variable, but has increased.  She has been on different medications for depression and anxiety for a number of years now and is currently on Wellbutrin and Lexapro.  She is seeing Dr. Roddie Mchomas-Wilson for psychotherapy and continues to engage in same.  She has noticed no major improvements there and cognition is declining.  However, the medications do help with panic attacks, which are less frequent now.  She has having a lot more panic attacks when working (2-3x a week).  No falls but loses her balance on occasion.  She is engaged in church activities.  Marriage is going well and family notices the cognitive decline and are noted.    ??  I also reviewed job performance reviews and improvement plans and cognitive issues are noted here and are noted as ongoing and worsening despite attempts and understanding and moves towards improved job performance.    ??  Date: ??02/01/2018    Requesting Physician: ??Stacey L. Epps,  MD    An EEG is requested in this 63 year-old woman to evaluate for   epileptiform abnormalities. ??    Medications: ??Medications are listed as Lexapro, Diovan, calcium.   ??    This tracing is obtained during the awake state. ??During   wakefulness, there are brief intermittent runs of   posteriorly-dominant and symmetrical low-to-medium amplitude 9   cycle per second activities which attenuate with eye opening. ??  Lower-voltage faster-frequency activities are seen symmetrically   over the anterior head regions.     Hyperventilation is not performed due to age and medical history.   ??    Intermittent photic stimulation induces symmetric posterior   driving responses. ??    Sleep is not attained. ??    Interpretation: ??This EEG recorded during the awake state is   normal. ??No epileptiform abnormalities are seen.   ??  ??  Result Information   ??  Status: Final result (Exam End: 11/22/2017 17:12) Provider Status: Open   Study Result   ??  EXAM: ??MRI BRAIN W WO CONT  INDICATION: ??mild cognitive impairment, G 31.84, memory loss, blackouts, sharp  pain in shoulders and needles in arms and legs worse on right side.  TECHNIQUE: Sagittal and axial T1 weighted images were obtained followed by  intravenous infusion 13 mL Dotarem and axial FLAIR and T-2 weighted images.  Delayed axial and coronal postgadolinium T1-weighted images were obtained as  well as sagittal FLAIR images and axial diffusion weighted images.  COMPARISON: MRI of the head 04/08/13, CT 02/16/13  FINDINGS:  The ventricular size and configuration are normal.   Scattered small foci of T2 hyperintensity again demonstrated in the cerebral  white matter most prominent in the left frontal lobe without significant change  since the MRI 04/08/13. No associated abnormal enhancement or restricted  diffusion.  There is no evidence of intracranial hemorrhage, infarct, mass or abnormal  extra-axial fluid collections.   Flow voids are present in the vertebral, basilar and carotid artery systems. ??  The craniocervical junction is unremarkable.   The other structures of the cranial base including paranasal sinuses are   unremarkable.  ??  IMPRESSION  IMPRESSION:   1. Chronic nonspecific foci of T2 hyperintensity cerebral white matter without  significant change since 04/08/13.  2. No acute intracranial abnormality demonstrated..  ??   ??  ??  ??  Neuropsych reviewed from 2018.   This is a lengthy report and history reported is consistent with history reported here.  Concerns for MCI with  mood issues raised and now concern is for a dementia versus pseudodementia.    ??      Dx then included:    This patient has now undergone two Neuropsychological Evaluations (previously by another provider in 2017 and the incident evaluation with me). She was previously diagnosed with Mild Cognitive Impairment and there was significant psychiatric overlay.  The current test results indicate a mild DECLINE in cognitive functioning over time, with significant problems noted in terms of severe deficits in attention (previously moderate) and significant (moderate to severe) decline in visual memory.  Interestingly, her auditory memory is fully normal, which leads away from a dementia type diagnosis.  She is also showing borderline range problems with working memory and processing speed, as well as problems with bilateral motor skills.  Executive functioning, perceptual reasoning, verbal comprehension, verbal fluency, and confrontation naming remain normal.  From an emotional standpoint, she reports severe depression and severe anxiety on two brief self-report  questionnaires.  Lengthier assessment of personality functioning could not be further interpreted due to item confusion/comprehension and consistency problems.  She previously generated a valid PAI which showed depression, anxiety, and PTSD.  Ongoing PTSD issues are likely.  ??              While the current profile not consistent with dementia, there is sufficient evidence to suggest a worsening mild cognitive disorder with significant psychiatric overlay. Consideration for psychiatric medication management for marked attention problems is suggested, along with psychiatric treatment and intensive psychotherapy for severe mood problems.  Consider EMDR.  At this point, her problems are worsening, which is showing an increased impact on vocational functioning, especially on any task requiring attention and/or memory, as well as mild issues with  motor skills.  Additionally, stress at work will exacerbate the underlying cognitive deficits. I see no evidence which would suggest malingering  I suggest short term disability be considered so as for her to fully engage with psychiatric and psychologic treatment, with a hopeful reduction in her cognitive difficulties pending successful psychiatric intervention.  Her prognosis is currently guarded, especially as a decline is seen over time.  I find her competent currently, but she needs supervision for medications and finances, and I have concerns for driving safety at this time.  We now have baseline and updated data on her.  Follow up in six months or prn, especially if there are any declines.  Clinical correlation is, of course, indicated.    ??              I will discuss these findings with the patient when she follows up with me in the near future.  A follow up Neuropsychological Evaluation is indicated on a prn basis, especially if there are any cognitive and/or emotional changes.    ??  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 Ginny Forth) and has been working from October 1 to December on working on cognitive functioning.  She can't remember things.  She has lost her ability to spell every day common words.  She can't complete a sentence of find the right words.  She has coordination and motor skills issues.  Her neck blades feel like  they are on fire.  She can't put water on her upper back or put on clothes because her skin feels raw.  She is followed by psychiatry.  She is participating in medication ([facility]), and  helps with pain management.  She has had increased falls.   Her memory has been getting worse.  IT was suggest she see ortho due to back pain and knee pain.  Her knees have been buckling.  She feels very jittery. Her skin hurts.  I do note that her EMG, EEG, carotid Doppler, MRI of the cervical spine, were normal.  Considering rheumatology. Showing up to wrong places when driving.  She has been feeling off.  She was supposed to see Dr. Sherryll Burger in the city and ended up here in our parking lot 30 minutes early, took 10 minutes to realize and 20 minutes to try and get to the city.  Remains independent for her ADLs.  Reminders for medications and finances.  See updated medication list below.  No new known stroke, meningitis/encephalitis, RMS Fever, Lupus, Lyme, TBI, sz, etc.  Her anxiety rises easily, especially with all the things she tries to get done so she can be on times to meetings such as these.  She gets more neurocognitively discombobulated the more anxious she is, and thus has restricted her activities.  She doesn't want to engage in conversations with others because she loses words or train of thought.      Neuropsychological Mental Status Exam (NMSE):  Historian: Good  Praxis: No UE apraxia  R/L Orientation: Intact to self and to other  Dress: within normal limits   Weight: within normal limits   Appearance/Hygiene: within normal limits   Gait: within normal limits   Assistive Devices: Glasses  Mood: Mildly depressed  Affect: Mildly flat  Comprehension: within normal limits   Thought Process: within normal limits   Expressive Language: within normal limits   Receptive Language: within normal limits   Motor:  No cognitive or motor perseveration  ETOH: Denied  Tobacco: Denied  Illicit: Denied  SI/HI: Denied current, but when things were really bad a year or so ago she had passive thoughts.  No plan, intent, or attempts.  She has been previously psychiatrically hospitalized at [facility].    Psychosis: Denied   Insight: Within normal limits  Judgment: Within normal limits  Other Psych:      Past Medical History:   Diagnosis Date   ??? Depression    ??? H/O colonoscopy 09-24-12    diverticulosis   ??? Hepatic steatosis    ??? Hypertension    ??? 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 Normal cardiac stress test 02-10-12   ??? Pneumonia        Past Surgical History:   Procedure Laterality Date   ??? HX GYN      tubal ligation       Allergies   Allergen Reactions   ??? Amlodipine Other (comments)     Loss of memory,nervous,jettery   ??? Losartan Other (comments)     Nervous,jettery   ??? Nifedipine Other (comments)     Swelling,palpations       Family History   Problem Relation Age of Onset   ??? Heart Disease Mother    ??? Heart Disease Father        Social History     Tobacco Use   ??? Smoking status: Never  Smoker   ??? Smokeless tobacco: Never Used   Substance Use Topics   ??? Alcohol use: No   ??? Drug use: No       Current Outpatient Medications   Medication Sig Dispense Refill   ??? omega-3 acid ethyl esters (LOVAZA) 1 gram capsule TAKE 1 CAPSULE BY MOUTH EVERY DAY     ??? donepeziL (ARICEPT) 5 mg tablet Take 5 mg by mouth nightly.     ??? gabapentin (NEURONTIN) 100 mg capsule TAKE 1 CAPSULE BY MOUTH THREE TIMES A DAY (Patient taking differently: Take 100 mg by mouth nightly.) 90 Cap 5   ??? lidocaine-prilocaine (EMLA) topical cream Apply  to affected area as needed for Pain. 30 g 3   ??? CHOLECALCIFEROL, VITAMIN D3, PO Take 5,000 Int'l Units/day by mouth.     ??? meloxicam (MOBIC) 15 mg tablet Take 15 mg by mouth.     ??? buPROPion XL (WELLBUTRIN XL) 300 mg XL tablet Take 300 mg by mouth daily.  2   ??? losartan-hydroCHLOROthiazide (HYZAAR) 100-25 mg per tablet Take 1 Tab by mouth daily.     ??? escitalopram oxalate (LEXAPRO) 20 mg tablet Take 20 mg by mouth daily.     ??? minoxidil (ROGAINE) 2 % external solution Apply  to affected area two  (2) times a day. Apply to scalp and rub it in     ??? VAYARIN 75-8.5-21.5 mg cap Take 1 Cap by mouth two (2) times a day.           Plan:  Obtain authorization for testing from insurance company.  Report to follow once testing, scoring, and interpretation completed.  ? Organic based neurocognitive issues versus mood disorder or combination of same.  ? Problems organic, functional, or both? This note will not be viewable in MyChart.

## 2018-12-22 NOTE — Telephone Encounter (Signed)
Pt is requesting documentation for her disability  Best # [phone]

## 2019-01-10 ENCOUNTER — Ambulatory Visit: Attending: Clinical Neuropsychologist | Primary: Family Medicine

## 2019-01-10 ENCOUNTER — Ambulatory Visit
Admit: 2019-01-10 | Discharge: 2019-01-10 | Payer: PRIVATE HEALTH INSURANCE | Attending: Clinical Neuropsychologist | Primary: Family Medicine

## 2019-01-10 DIAGNOSIS — G3184 Mild cognitive impairment, so stated: Secondary | ICD-10-CM

## 2019-01-10 NOTE — Progress Notes (Signed)
[facility]  [facility]  NEUROLOGY CLINIC   [address]   [place], [place] [address]   [phone] Office   [phone] Fax      Neuropsychological Evaluation Report    Exam # 2    Referral:  Katherina Right, MD, Dr. Mora Bellman SHION SIVER is a 63 y.o. right handed married African American female who was unaccompanied to the initial clinical interview on 12/22/18 .  Please refer to her medical records for details pertaining to her history.    At the start of the appointment, I reviewed the patient's [facility] Epic Chart (including Media scanned in from previous providers) for the active Problem List, all pertinent Past Medical Hx, medications, recent radiologic and laboratory findings.  In addition, I reviewed pt's documented Immunization Record and Encounter History.  When I saw her last:    CHENIKA MORGENSTERN is a 63 y.o. right handed married African American female who was unaccompanied to the initial clinical interview on 04/16/18 .  Please refer to her medical records for details pertaining to her history.  Briefly, the patient reported that she completed two years of college without history of previously diagnosed LD and/or receipt of special education services.  Stopped working June 3 and she has applied for long term disability.  She has been dealing with a progressive decline in short term memory.  Forgets the content of conversations. Misplaces things. Has to write everything down.  Starts tasks and does not complete. Loses words.  Loses train of thought.  Loses two pairs of glasses and three diamond rings in the past two months.  Slower to learn and comprehend new things.  No history of stroke, meningitis/encephalitis, RMS Fever, Lupus, Lyme, TBI, sz.  She gets headaches when she is really tired.  Her mother had dementia and was showing memory problems in her 42s and died at age 21.  Patient has not noticed no major changes in her sense  of taste or smell.  She has missed exits when driving and avoids driving at night or with heavy traffic.  No accidents or near misses.  She is fairly independent for medications but keeps notes, and same with finances.  She is independent for her ADLs.  Sleep is poor, and has been poor for quite some time.  She does get frequent headaches and is tired a lot which makes the headaches worse, too. Appetite is variable, but has increased.  She has been on different medications for depression and anxiety for a number of years now and is currently on Wellbutrin and Lexapro.  She is seeing Dr. Roddie Mc for psychotherapy and continues to engage in same.  She has noticed no major improvements there and cognition is declining.  However, the medications do help with panic attacks, which are less frequent now.  She has having a lot more panic attacks when working (2-3x a week).  No falls but loses her balance on occasion.  She is engaged in church activities.  Marriage is going well and family notices the cognitive decline and are noted.    ??  I also reviewed job performance reviews and improvement plans and cognitive issues are noted here and are noted as ongoing and worsening despite attempts and understanding and moves towards improved job performance.    ??  Date: ??02/01/2018    Requesting Physician: ??Stacey L. Epps, MD    An EEG is requested in this 63 year-old woman to  evaluate for   epileptiform abnormalities. ??    Medications: ??Medications are listed as Lexapro, Diovan, calcium.   ??    This tracing is obtained during the awake state. ??During   wakefulness, there are brief intermittent runs of   posteriorly-dominant and symmetrical low-to-medium amplitude 9   cycle per second activities which attenuate with eye opening. ??  Lower-voltage faster-frequency activities are seen symmetrically   over the anterior head regions.    Hyperventilation is not performed due to age and medical history.   ??    Intermittent photic  stimulation induces symmetric posterior   driving responses. ??    Sleep is not attained. ??    Interpretation: ??This EEG recorded during the awake state is   normal. ??No epileptiform abnormalities are seen.   ??  ??  Result Information   ??  Status: Final result (Exam End: 11/22/2017 17:12) Provider Status: Open   Study Result   ??  EXAM: ??MRI BRAIN W WO CONT  INDICATION: ??mild cognitive impairment, G 31.84, memory loss, blackouts, sharp  pain in shoulders and needles in arms and legs worse on right side.  TECHNIQUE: Sagittal and axial T1 weighted images were obtained followed by  intravenous infusion 13 mL Dotarem and axial FLAIR and T-2 weighted images.  Delayed axial and coronal postgadolinium T1-weighted images were obtained as  well as sagittal FLAIR images and axial diffusion weighted images.  COMPARISON: MRI of the head 04/08/13, CT 02/16/13  FINDINGS:  The ventricular size and configuration are normal.   Scattered small foci of T2 hyperintensity again demonstrated in the cerebral  white matter most prominent in the left frontal lobe without significant change  since the MRI 04/08/13. No associated abnormal enhancement or restricted  diffusion.  There is no evidence of intracranial hemorrhage, infarct, mass or abnormal  extra-axial fluid collections.   Flow voids are present in the vertebral, basilar and carotid artery systems. ??  The craniocervical junction is unremarkable.   The other structures of the cranial base including paranasal sinuses are   unremarkable.  ??  IMPRESSION  IMPRESSION:   1. Chronic nonspecific foci of T2 hyperintensity cerebral white matter without  significant change since 04/08/13.  2. No acute intracranial abnormality demonstrated..  ??   ??  ??  ??  Neuropsych reviewed from 2018.   This is a lengthy report and history reported is consistent with history reported here.  Concerns for MCI with mood issues raised and now concern is for a dementia versus pseudodementia.    ??      Dx then  included:    This patient has now undergone two Neuropsychological Evaluations (previously by another provider in 2017 and the incident evaluation with me). She was previously diagnosed with Mild Cognitive Impairment and there was significant psychiatric overlay.  The current test results indicate a mild DECLINE in cognitive functioning over time, with significant problems noted in terms of severe deficits in attention (previously moderate) and significant (moderate to severe) decline in visual memory.  Interestingly, her auditory memory is fully normal, which leads away from a dementia type diagnosis.  She is also showing borderline range problems with working memory and processing speed, as well as problems with bilateral motor skills.  Executive functioning, perceptual reasoning, verbal comprehension, verbal fluency, and confrontation naming remain normal.  From an emotional standpoint, she reports severe depression and severe anxiety on two brief self-report questionnaires.  Lengthier assessment of personality functioning could not be further interpreted due to  item confusion/comprehension and consistency problems.  She previously generated a valid PAI which showed depression, anxiety, and PTSD.  Ongoing PTSD issues are likely.  ??              While the current profile not consistent with dementia, there is sufficient evidence to suggest a worsening mild cognitive disorder with significant psychiatric overlay. Consideration for psychiatric medication management for marked attention problems is suggested, along with psychiatric treatment and intensive psychotherapy for severe mood problems.  Consider EMDR.  At this point, her problems are worsening, which is showing an increased impact on vocational functioning, especially on any task requiring attention and/or memory, as well as mild issues with motor skills.  Additionally, stress at work will exacerbate the underlying cognitive deficits. I see no evidence which  would suggest malingering  I suggest short term disability be considered so as for her to fully engage with psychiatric and psychologic treatment, with a hopeful reduction in her cognitive difficulties pending successful psychiatric intervention.  Her prognosis is currently guarded, especially as a decline is seen over time.  I find her competent currently, but she needs supervision for medications and finances, and I have concerns for driving safety at this time.  We now have baseline and updated data on her.  Follow up in six months or prn, especially if there are any declines.  Clinical correlation is, of course, indicated.    ??              I will discuss these findings with the patient when she follows up with me in the near future.  A follow up Neuropsychological Evaluation is indicated on a prn basis, especially if there are any cognitive and/or emotional changes.    ??  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 Ginny Forth) and has been working from October 1 to December on working on cognitive functioning.  She can't remember things.  She has lost her ability to spell every day common words.  She can't complete a sentence of find the right words.  She has coordination and motor skills issues.  Her neck blades feel like they are on fire.  She can't put water on her upper back or  put on clothes because her skin feels raw.  She is followed by psychiatry.  She is participating in meditation ([employer]), and helps with pain management.  She has had increased falls.   Her memory has been getting worse.  IT was suggest she see ortho due to back  pain and knee pain.  Her knees have been buckling.  She feels very jittery. Her skin hurts.  I do note that her EMG, EEG, carotid Doppler, MRI of the cervical spine, were normal.  Considering rheumatology. Showing up to wrong places when driving.  She has been feeling off.  She was supposed to see Dr. Sherryll Burger in the city and ended up here in our parking lot 30 minutes early, took 10 minutes to realize and 20 minutes to try and get to the city.  Remains independent for her ADLs.  Reminders for medications and finances.  See updated medication list below.  No new known stroke, meningitis/encephalitis, RMS Fever, Lupus, Lyme, TBI, sz, etc.  Her anxiety rises easily, especially with all the things she tries to get done so she can be on times to meetings such as these.  She gets more neurocognitively discombobulated the more anxious she is, and thus has restricted her activities.  She doesn't want to engage in conversations with others because she loses words or train of thought.      Neuropsychological Mental Status Exam (NMSE):  Historian: Good  Praxis: No UE apraxia  R/L Orientation: Intact to self and to other  Dress: within normal limits   Weight: within normal limits   Appearance/Hygiene: within normal limits   Gait: within normal limits   Assistive Devices: Glasses  Mood: Mildly depressed  Affect: Mildly flat  Comprehension: within normal limits   Thought Process: within normal limits   Expressive Language: within normal limits   Receptive Language: within normal limits   Motor:  No cognitive or motor perseveration  ETOH: Denied  Tobacco: Denied  Illicit: Denied  SI/HI: Denied current, but when things were really bad a year or so ago she had passive thoughts.  No plan, intent, or attempts.  She has been previously psychiatrically [facility] at [facility].    Psychosis: Denied  Insight: Within normal limits  Judgment: Within normal limits  Other Psych:      Past Medical History:   Diagnosis Date   ??? Depression    ???  H/O colonoscopy 09-24-12    diverticulosis   ??? Hepatic steatosis    ??? Hypertension    ??? 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 Normal cardiac stress test 02-10-12   ??? Pneumonia        Past Surgical History:   Procedure Laterality Date   ??? HX GYN      tubal ligation       Allergies   Allergen Reactions   ??? Amlodipine Other (comments)     Loss of memory,nervous,jettery   ??? Losartan Other (comments)     Nervous,jettery   ??? Nifedipine Other (comments)     Swelling,palpations       Family History   Problem Relation Age of Onset   ??? Heart Disease Mother    ??? Heart Disease Father        Social History     Tobacco Use   ??? Smoking status: Never Smoker   ??? Smokeless tobacco: Never Used   Substance Use Topics   ???  Alcohol use: No   ??? Drug use: No       Current Outpatient Medications   Medication Sig Dispense Refill   ??? omega-3 acid ethyl esters (LOVAZA) 1 gram capsule TAKE 1 CAPSULE BY MOUTH EVERY DAY     ??? donepeziL (ARICEPT) 5 mg tablet Take 5 mg by mouth nightly.     ??? gabapentin (NEURONTIN) 100 mg capsule TAKE 1 CAPSULE BY MOUTH THREE TIMES A DAY (Patient taking differently: Take 100 mg by mouth nightly.) 90 Cap 5   ??? lidocaine-prilocaine (EMLA) topical cream Apply  to affected area as needed for Pain. 30 g 3   ??? CHOLECALCIFEROL, VITAMIN D3, PO Take 5,000 Int'l Units/day by mouth.     ??? meloxicam (MOBIC) 15 mg tablet Take 15 mg by mouth.     ??? buPROPion XL (WELLBUTRIN XL) 300 mg XL tablet Take 300 mg by mouth daily.  2   ??? losartan-hydroCHLOROthiazide (HYZAAR) 100-25 mg per tablet Take 1 Tab by mouth daily.     ??? escitalopram oxalate (LEXAPRO) 20 mg tablet Take 20 mg by mouth daily.     ??? minoxidil (ROGAINE) 2 % external solution Apply  to affected area two (2) times a day. Apply to scalp and rub it in     ??? VAYARIN 75-8.5-21.5 mg cap Take 1 Cap by mouth two (2) times a day.           Plan:  Obtain authorization for testing from  insurance company.  Report to follow once testing, scoring, and interpretation completed.  ? Organic based neurocognitive issues versus mood disorder or combination of same.  ? Problems organic, functional, or both? This note will not be viewable in MyChart.    Neuropsychological Evaluation  Patient Testing 01/10/19 eport Completed 01/12/19  A Psychometrist Assisted w/ portions of this evaluation while under my direct supervision    Please refer to the patient's initial interview progress note (copied above) and her medical records for details pertaining to her history.  Today's neuropsychological test scores follow:    The following assessment procedures were performed:      Neuropsychologist Performed, Interpreted, & Reported: Neuropsychological Mental Status Exam, Revised Memory & Behavior Checklist, Mini Mental State Exam, Clock Drawing Test, Test Of Premorbid Functioning, McGill-Melzack Pain Questionnaire,  History Taking  & Clinical Interview With The Patient, PAI, CPT-III, Review Of Available Records.    Psychometrist Administered & Neuropsychologist Interpreted & Neuropsychologist Reported: Chief Financial Officer, Grooved Pegboard Test, Wechsler Adult Intelligence Scale ??? IV, Verbal Fluency Tests, [employer] ??? Revised, Trailmaking Test Parts A & B, [place] Verbal Learning Test ??? 3, Rey Complex Figure Test, Beck Depression Inventory ??? II, Beck Anxiety Inventory,.      Test Findings:  Note:  The patient???s raw data have been compared with currently available norms which include demographic corrections for age, gender, and/or education.  Sometimes, the patient???s scores are compared to demographically similar individuals as close to the patient???s age, education level, etc., as possible.  "Average" is viewed as being +/- 1 standard deviation (SD) from the stated mean for a particular test score.  "Low average" is viewed as being between 1 and 2 SD below the mean, and above average is viewed as being 1 and 2  SD above the mean.  Scores falling in the ???borderline??? range (between 1-1/2 and 2 SD below the mean) are viewed with particular attention as to whether they are normal or abnormal neurocognitive test scores.  Other methods of  inference in analyzing the test data are also utilized, including the pattern and range of scores in the profile, bilateral motor functions, and the presence, if any, of pathognomonic signs.      Behaviorally, the patient was friendly and cooperative and appeared motivated to perform well during this examination.  Within this context, the results of this evaluation are viewed as a valid reflection of the patient???s actual neurocognitive and emotional status.       Her structured word list fluency, as assessed by the FAS Test, was within the mildly impaired range with a T score of 39.  Category fluency was within the below average range with a T score of 41.  Confrontation naming ability, as assessed by the [facility] Test ??? Revised, was within the average  range at 52/60 correct (T = 52).   This pattern of performance is indicative of a patient who is at increased risk for day-to-day problems with verbal fluency and confrontation naming ability was normal.   This is a mild decline in verbal fluency over time.       The patient was administered the Conners??? Continuous Performance Test ??? III, a computer-administered test of sustained attention, and review of the subscales within this instrument revealed moderate to severe concern for inattentiveness with additional concern for impulsivity.  This pattern of performance is  indicative of a patient who is at increased risk for day-to-day problems with sustained visual attention/concentration.  No change from previous testing.       The patient was administered the Wechsler Adult Intelligence Scale ??? IV.  See Appendix I for full breakdown of IQ test scores (scanned into media section of this EMR).  As can be seen, there was no clinically  significant difference between her extremely low range Working Memory Index score of 69 (2nd %ile) and her borderline range Processing Speed Index score of 76 (5th %ile).  Her Verbal Comprehension Index score of 89 (23rdh %ile) was within the low average range.  Her Perceptual Reasoning Index score of 86 (18th %ile) was within the low average range.  This pattern of performance is  indicative of a patient who is at increased risk for day-to-day problems with working memory and processing speed, and those scores are lower than expected based on her performance on a task estimating premorbid functioning levels.  No major changes from previous testing, however.         The patient was administered the [place] Verbal Learning Test  - 3 and generated a normal range and positive learning curve over five repeated auditory word list learning trials.  An interference trial was within normal limits.  Free and cued, short and long delayed recall were within or above the normal range.  Recognition recall was borderline.  Forced choice recall as normal, suggesting good effort on this test.  This pattern of performance is not indicative of a patient who is at increased risk for day-to-day problems with auditory learning and/or memory.  No change from previous testing.       The patient???s performance on the copy portion of the Rey Complex Figure Test was within normal limits  (>16th %ile).  Recall for the complex design was within the normal range after a short delay, impaired after a long delay, and normal with respect to recognition recall.  This is generally consistent with previous testing and no major declines in visual memory are noted over time.       Simple timed visual motor  sequencing (Trailmaking Test Part A) was within the mildly impaired range with a T score of 37.  Her performance on a similar, but more complex task of timed visual motor sequencing (Trailmaking Test Part B) was within the moderately to severely  impaired range with a T score of 21.  She made three sequencing errors and then the test was discontinued due to time.    Taken together, this pattern of performance is indicative of a patient who is at increased risk for day-to-day problems with executive functioning.  This is a decline from previous testing.       Motor speed for finger tapping was within the mildly impaired range bilaterally.  Fine motor dexterity was moderately to severely impaired bilaterally.  This is a decline in motor skills over time.       The patient rated her current level of pain as "2/5- Discomforting" on the McGill-Melzack Pain Questionnaire.  She reported pain in her neck, right arm, right wrist, lower back, lower abdomen, right leg.  She had previously rated her pain as 1/5- Mild     . Her Beck Depression Inventory ???II score of 33 was within the severely depressed range.  Her Beck Anxiety Inventory score of 41 reflected severe anxiety.       The patient was also administered the Personality Assessment Inventory and generated a valid profile for interpretation.  Within this context, there are numerous clinical scale elevations.  She reports psychosis on this measure, though she denied this on interview.  She reports experiencing unusual perceptual events and full blown hallucinations as well as unusual ideas that may include magical thinking or delusional beliefs.  She reports strong support for somatization  Marked anxiety is present.  There is strong support for PTSD.  Her level of anxiety is severe to the degree that her ability to concentrate and to attend are significantly compromised.   She has a history of involvement in intense and volatile relationships and tends to be preoccupeid with consistent fears of being abandoned or rejected by those around her.  Interpersonally, she is withdrawn and introverted.  She reports periodic/transient thoughts of self-harm on this measure and denied currently active plan or intent with me.   She can be meek and unassertive at times.  She is motivated for treatment.      Impressions & Recommendations:  This patient has now undergone three evaluations of neurocognitive status.  The first was with another provider and the last two were here with me.  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 She is showing a decline in executive functioning and a decline in motor skills are also noted.  Thankfully, though, she is not showing a decline in memory.  This latter news is very reassuring.  From an emotional standpoint, she reports a decline in psychiatric status as well, despite engagement with psychiatry and psychology.  She reports severe depression, severe anxiety, somatization and conversion, and PTSD.  Her anxiety is  severe to the degree whereby her ability to attend and to concentrate is significantly compromised. She also reports psychosis on personality testing and denied this on interview.  Passive suicidal thinking is reported without current plan or intent.       Thankfully,this profile remains consistent with MCI and is not (yet) dementia.  My concern is for worsening psychiatric functioning  impacting her day-to-day cognition, especially her memory.   IF she is psychotic, then of course this is a new clinical target to address well.  A psychiatric review of her medication management for mood is advised.  I also recommend consideration for an appropriate medication for attention if not medically contraindicated.  Continue active engagement in psychotherapy.  Consider TMS.  I have some concerns about her competency now, and suggest assignment of a POA if this has not been done so already.  I do not believe she is safe to drive and recommend a formal evaluation of driving safety.  Supervise/system should be in place for medications and  finances.  Stay active mentally, physically, and socially.  I would like to see her again as soon as mood improves to better clarify this unusual MCI pattern.  We now have updated data on her.  Clinical correlation is, of course, indicated.       I will discuss these findings with the patient when she follows up with me in the near future.  A follow up Neuropsychological Evaluation is indicated on a prn basis, especially if there are any cognitive and/or emotional changes.      DIAGNOSES:  MCI     Depression, Severe     Anxiety, Severe     Somatization     Conversion     PTSD     Rule out Psychotic Disorder, NOS      The above information is based upon information currently available to me.  If there is any additional information of which I am currently unaware, I would be more than happy to review it upon having it made available to me.  Thank you for the opportunity to see this interesting individual.     Sincerely,       Ridhi Hoffert A. Wynonia Hazard, PsyD, EdS    CC: Katherina Right, MD , Dr. Jettie Pagan    Time Documentation:      [phone] x1 &  [phone] x 1 Neuropsych testing/data gathering by Neuropsychologist (60 minutes)     96138 x 1  96139 x 7 Test Administration/Data Gathering By Technician: (4 hours). 09811 x 1 (first 30 minutes), 96138 x 7 (each additional 30 minutes)    96132 x 1  96133 x 1 Testing Evaluation Services by Neuropsychologist (1 hour, 50 minutes) 96132 x 1 (first hour), 96133 x 1 (50 minutes)    Definitions:      96116/96121:  Neurobehavioral Status Exam, Clinical interview.  Clinical assessment of thinking, reasoning and judgment, by neuropsychologist, both face to face time with patient and time interpreting those test results and reporting, first and subsequent hours)    96138/96139: Neuropsychological Test Administration by Technician/Psychometrist, first 30 minutes and each additional 30 minutes.     The above includes: Record review.  Review of history provided by patient.  Review of collaborative  information.  Testing by Clinician.  Review of raw data. Scoring. Report writing of individual tests administered by Clinician.  Integration of individual tests administered by psychometrist with NSE/testing by clinician, review of records/history/collaborative information, case Conceptualization, treatment planning, clinical decision making, report writing, coordination Of Care. Psychometry test codes as time spent by psychometrist administering and scoring neurocognitive/psychological tests under supervision of neuropsychologist.  Integral services including scoring of raw data, data interpretation, case conceptualization, report writing etcetera were initiated after the patient finished testing/raw data collected and was completed on the date the report was signed.

## 2019-01-10 NOTE — Progress Notes (Signed)
[facility]  [facility]  NEUROLOGY CLINIC   [address] [place] [address]   [place], [place] [address]   [phone] Office   [phone] Fax      Neuropsychological Evaluation Report    Exam # 2    Referral:  Katherina Right, MD, Dr. Mora Bellman RANITA SPRINGER is a 63 y.o. right handed married African American female who was unaccompanied to the initial clinical interview on 12/22/18 .  Please refer to her medical records for details pertaining to her history.    At the start of the appointment, I reviewed the patient's [facility] Epic Chart (including Media scanned in from previous providers) for the active Problem List, all pertinent Past Medical Hx, medications, recent radiologic and laboratory findings.  In addition, I reviewed pt's documented Immunization Record and Encounter History.  When I saw her last:    PAISLEA ANHORN is a 63 y.o. right handed married African American female who was unaccompanied to the initial clinical interview on 04/16/18 .  Please refer to her medical records for details pertaining to her history.  Briefly, the patient reported that she completed two years of college without history of previously diagnosed LD and/or receipt of special education services.  Stopped working June 3 and she has applied for long term disability.  She has been dealing with a progressive decline in short term memory.  Forgets the content of conversations. Misplaces things. Has to write everything down.  Starts tasks and does not complete. Loses words.  Loses train of thought.  Loses two pairs of glasses and three diamond rings in the past two months.  Slower to learn and comprehend new things.  No history of stroke, meningitis/encephalitis, RMS Fever, Lupus, Lyme, TBI, sz.  She gets headaches when she is really tired.  Her mother had dementia and was showing memory problems in her 89s and died at age  22.  Patient has not noticed no major changes in her sense of taste or smell.  She has missed exits when driving and avoids driving at night or with heavy traffic.  No accidents or near misses.  She is fairly independent for medications but keeps notes, and same with finances.  She is independent for her ADLs.  Sleep is poor, and has been poor for quite some time.  She does get frequent headaches and is tired a lot which makes the headaches worse, too. Appetite is variable, but has increased.  She has been on different medications for depression and anxiety for a number of years now and is currently on Wellbutrin and Lexapro.  She is seeing Dr. Roddie Mc for psychotherapy and continues to engage in same.  She has noticed no major improvements there and cognition is declining.  However, the medications do help with panic attacks, which are less frequent now.  She has having a lot more panic attacks when working (2-3x a week).  No falls but loses her balance on occasion.  She is engaged in church activities.  Marriage is going well and family notices the cognitive decline and are noted.    ??  I also reviewed job performance reviews and improvement plans and cognitive issues are noted here and are noted as ongoing and worsening despite attempts and understanding and moves towards improved job performance.    ??  Date: ??02/01/2018    Requesting Physician: ??Stacey L. Epps, MD    An EEG is requested in this 63 year-old woman to  evaluate for   epileptiform abnormalities. ??    Medications: ??Medications are listed as Lexapro, Diovan, calcium.   ??    This tracing is obtained during the awake state. ??During   wakefulness, there are brief intermittent runs of   posteriorly-dominant and symmetrical low-to-medium amplitude 9   cycle per second activities which attenuate with eye opening. ??  Lower-voltage faster-frequency activities are seen symmetrically   over the anterior head regions.     Hyperventilation is not performed due to age and medical history.   ??    Intermittent photic stimulation induces symmetric posterior   driving responses. ??    Sleep is not attained. ??    Interpretation: ??This EEG recorded during the awake state is   normal. ??No epileptiform abnormalities are seen.   ??  ??  Result Information   ??  Status: Final result (Exam End: 11/22/2017 17:12) Provider Status: Open   Study Result   ??  EXAM: ??MRI BRAIN W WO CONT  INDICATION: ??mild cognitive impairment, G 31.84, memory loss, blackouts, sharp  pain in shoulders and needles in arms and legs worse on right side.  TECHNIQUE: Sagittal and axial T1 weighted images were obtained followed by  intravenous infusion 13 mL Dotarem and axial FLAIR and T-2 weighted images.  Delayed axial and coronal postgadolinium T1-weighted images were obtained as  well as sagittal FLAIR images and axial diffusion weighted images.  COMPARISON: MRI of the head 04/08/13, CT 02/16/13  FINDINGS:  The ventricular size and configuration are normal.   Scattered small foci of T2 hyperintensity again demonstrated in the cerebral  white matter most prominent in the left frontal lobe without significant change  since the MRI 04/08/13. No associated abnormal enhancement or restricted  diffusion.  There is no evidence of intracranial hemorrhage, infarct, mass or abnormal  extra-axial fluid collections.   Flow voids are present in the vertebral, basilar and carotid artery systems. ??  The craniocervical junction is unremarkable.   The other structures of the cranial base including paranasal sinuses are   unremarkable.  ??  IMPRESSION  IMPRESSION:   1. Chronic nonspecific foci of T2 hyperintensity cerebral white matter without  significant change since 04/08/13.  2. No acute intracranial abnormality demonstrated..  ??   ??  ??  ??  Neuropsych reviewed from 2018.   This is a lengthy report and history reported is consistent with history reported here.  Concerns for MCI with  mood issues raised and now concern is for a dementia versus pseudodementia.    ??      Dx then included:    This patient has now undergone two Neuropsychological Evaluations (previously by another provider in 2017 and the incident evaluation with me). She was previously diagnosed with Mild Cognitive Impairment and there was significant psychiatric overlay.  The current test results indicate a mild DECLINE in cognitive functioning over time, with significant problems noted in terms of severe deficits in attention (previously moderate) and significant (moderate to severe) decline in visual memory.  Interestingly, her auditory memory is fully normal, which leads away from a dementia type diagnosis.  She is also showing borderline range problems with working memory and processing speed, as well as problems with bilateral motor skills.  Executive functioning, perceptual reasoning, verbal comprehension, verbal fluency, and confrontation naming remain normal.  From an emotional standpoint, she reports severe depression and severe anxiety on two brief self-report questionnaires.  Lengthier assessment of personality functioning could not be further interpreted due to  item confusion/comprehension and consistency problems.  She previously generated a valid PAI which showed depression, anxiety, and PTSD.  Ongoing PTSD issues are likely.  ??              While the current profile not consistent with dementia, there is sufficient evidence to suggest a worsening mild cognitive disorder with significant psychiatric overlay. Consideration for psychiatric medication management for marked attention problems is suggested, along with psychiatric treatment and intensive psychotherapy for severe mood problems.  Consider EMDR.  At this point, her problems are worsening, which is showing an increased impact on vocational functioning, especially on any task requiring attention and/or memory, as well as mild issues with  motor skills.  Additionally, stress at work will exacerbate the underlying cognitive deficits. I see no evidence which would suggest malingering  I suggest short term disability be considered so as for her to fully engage with psychiatric and psychologic treatment, with a hopeful reduction in her cognitive difficulties pending successful psychiatric intervention.  Her prognosis is currently guarded, especially as a decline is seen over time.  I find her competent currently, but she needs supervision for medications and finances, and I have concerns for driving safety at this time.  We now have baseline and updated data on her.  Follow up in six months or prn, especially if there are any declines.  Clinical correlation is, of course, indicated.    ??              I will discuss these findings with the patient when she follows up with me in the near future.  A follow up Neuropsychological Evaluation is indicated on a prn basis, especially if there are any cognitive and/or emotional changes.    ??  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 Ginny Forth) and has been working from October 1 to December on working on cognitive functioning.  She can't remember things.  She has lost her ability to spell every day common words.  She can't complete a sentence of find the right words.  She has coordination and motor skills issues.  Her neck blades feel like they are on fire.  She can't put water on her upper back or  put on clothes because her skin feels raw.  She is followed by psychiatry.  She is participating in meditation ([employer]), and  helps with pain management.  She has had increased falls.   Her memory has been getting worse.  IT was suggest she see ortho due to back pain and knee pain.  Her knees have been buckling.  She feels very jittery. Her skin hurts.  I do note that her EMG, EEG, carotid Doppler, MRI of the cervical spine, were normal.  Considering rheumatology. Showing up to wrong places when driving.  She has been feeling off.  She was supposed to see Dr. Sherryll Burger in the city and ended up here in our parking lot 30 minutes early, took 10 minutes to realize and 20 minutes to try and get to the city.  Remains independent for her ADLs.  Reminders for medications and finances.  See updated medication list below.  No new known stroke, meningitis/encephalitis, RMS Fever, Lupus, Lyme, TBI, sz, etc.  Her anxiety rises easily, especially with all the things she tries to get done so she can be on times to meetings such as these.  She gets more neurocognitively discombobulated the more anxious she is, and thus has restricted her activities.  She doesn't want to engage in conversations with others because she loses words or train of thought.      Neuropsychological Mental Status Exam (NMSE):  Historian: Good  Praxis: No UE apraxia  R/L Orientation: Intact to self and to other  Dress: within normal limits   Weight: within normal limits   Appearance/Hygiene: within normal limits   Gait: within normal limits   Assistive Devices: Glasses  Mood: Mildly depressed  Affect: Mildly flat  Comprehension: within normal limits   Thought Process: within normal limits   Expressive Language: within normal limits   Receptive Language: within normal limits   Motor:  No cognitive or motor perseveration  ETOH: Denied  Tobacco: Denied  Illicit: Denied  SI/HI: Denied current, but when things were really bad a year or so ago she had passive thoughts.  No plan, intent, or attempts.  She has been previously psychiatrically hospitalized at [facility]    Psychosis: Denied   Insight: Within normal limits  Judgment: Within normal limits  Other Psych:      Past Medical History:   Diagnosis Date   ??? Depression    ??? H/O colonoscopy 09-24-12    diverticulosis   ??? Hepatic steatosis    ??? Hypertension    ??? 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 Normal cardiac stress test 02-10-12   ??? Pneumonia        Past Surgical History:   Procedure Laterality Date   ??? HX GYN      tubal ligation       Allergies   Allergen Reactions   ??? Amlodipine Other (comments)     Loss of memory,nervous,jettery   ??? Losartan Other (comments)     Nervous,jettery   ??? Nifedipine Other (comments)     Swelling,palpations       Family History   Problem Relation Age of Onset   ??? Heart Disease Mother    ??? Heart Disease Father        Social History     Tobacco Use   ??? Smoking status: Never Smoker   ??? Smokeless tobacco: Never Used   Substance Use Topics   ???  Alcohol use: No   ??? Drug use: No       Current Outpatient Medications   Medication Sig Dispense Refill   ??? omega-3 acid ethyl esters (LOVAZA) 1 gram capsule TAKE 1 CAPSULE BY MOUTH EVERY DAY     ??? donepeziL (ARICEPT) 5 mg tablet Take 5 mg by mouth nightly.     ??? gabapentin (NEURONTIN) 100 mg capsule TAKE 1 CAPSULE BY MOUTH THREE TIMES A DAY (Patient taking differently: Take 100 mg by mouth nightly.) 90 Cap 5   ??? lidocaine-prilocaine (EMLA) topical cream Apply  to affected area as needed for Pain. 30 g 3   ??? CHOLECALCIFEROL, VITAMIN D3, PO Take 5,000 Int'l Units/day by mouth.     ??? meloxicam (MOBIC) 15 mg tablet Take 15 mg by mouth.     ??? buPROPion XL (WELLBUTRIN XL) 300 mg XL tablet Take 300 mg by mouth daily.  2   ??? losartan-hydroCHLOROthiazide (HYZAAR) 100-25 mg per tablet Take 1 Tab by mouth daily.     ??? escitalopram oxalate (LEXAPRO) 20 mg tablet Take 20 mg by mouth daily.     ??? minoxidil (ROGAINE) 2 % external solution Apply  to affected area two  (2) times a day. Apply to scalp and rub it in     ??? VAYARIN 75-8.5-21.5 mg cap Take 1 Cap by mouth two (2) times a day.           Plan:  Obtain authorization for testing from insurance company.  Report to follow once testing, scoring, and interpretation completed.  ? Organic based neurocognitive issues versus mood disorder or combination of same.  ? Problems organic, functional, or both? This note will not be viewable in MyChart.    Neuropsychological Evaluation  Patient Testing 01/10/19 eport Completed 01/12/19  A Psychometrist Assisted w/ portions of this evaluation while under my direct supervision    Please refer to the patient's initial interview progress note (copied above) and her medical records for details pertaining to her history.  Today's neuropsychological test scores follow:    The following assessment procedures were performed:      Neuropsychologist Performed, Interpreted, & Reported: Neuropsychological Mental Status Exam, Revised Memory & Behavior Checklist, Mini Mental State Exam, Clock Drawing Test, Test Of Premorbid Functioning, McGill-Melzack Pain Questionnaire,  History Taking  & Clinical Interview With The Patient, PAI, CPT-III, Review Of Available Records.    Psychometrist Administered & Neuropsychologist Interpreted & Neuropsychologist Reported: Chief Financial Officer, Grooved Pegboard Test, Wechsler Adult Intelligence Scale ??? IV, Verbal Fluency Tests, [employer] ??? Revised, Trailmaking Test Parts A & B, [place] Verbal Learning Test ??? 3, Rey Complex Figure Test, Beck Depression Inventory ??? II, Beck Anxiety Inventory,.      Test Findings:  Note:  The patient???s raw data have been compared with currently available norms which include demographic corrections for age, gender, and/or education.  Sometimes, the patient???s scores are compared to demographically similar individuals as close to the patient???s age, education level, etc., as possible.  "Average" is viewed as being +/- 1  standard deviation (SD) from the stated mean for a particular test score.  "Low average" is viewed as being between 1 and 2 SD below the mean, and above average is viewed as being 1 and 2 SD above the mean.  Scores falling in the ???borderline??? range (between 1-1/2 and 2 SD below the mean) are viewed with particular attention as to whether they are normal or abnormal neurocognitive test scores.  Other methods of  inference in analyzing the test data are also utilized, including the pattern and range of scores in the profile, bilateral motor functions, and the presence, if any, of pathognomonic signs.      Behaviorally, the patient was friendly and cooperative and appeared motivated to perform well during this examination.  Within this context, the results of this evaluation are viewed as a valid reflection of the patient???s actual neurocognitive and emotional status.       Her structured word list fluency, as assessed by the FAS Test, was within the mildly impaired range with a T score of 39.  Category fluency was within the below average range with a T score of 41.  Confrontation naming ability, as assessed by the [facility] Test ??? Revised, was within the average  range at 52/60 correct (T = 52).   This pattern of performance is indicative of a patient who is at increased risk for day-to-day problems with verbal fluency and confrontation naming ability was normal.   This is a mild decline in verbal fluency over time.       The patient was administered the [place]ners??? Continuous Performance Test ??? III, a computer-administered test of sustained attention, and review of the subscales within this instrument revealed moderate to severe concern for inattentiveness with additional concern for impulsivity.  This pattern of performance is  indicative of a patient who is at increased risk for day-to-day problems with sustained visual attention/concentration.  No change from previous testing.        The patient was administered the Wechsler Adult Intelligence Scale ??? IV.  See Appendix I for full breakdown of IQ test scores (scanned into media section of this EMR).  As can be seen, there was no clinically significant difference between her extremely low range Working Memory Index score of 69 (2nd %ile) and her borderline range Processing Speed Index score of 76 (5th %ile).  Her Verbal Comprehension Index score of 89 (23rdh %ile) was within the low average range.  Her Perceptual Reasoning Index score of 86 (18th %ile) was within the low average range.  This pattern of performance is  indicative of a patient who is at increased risk for day-to-day problems with working memory and processing speed, and those scores are lower than expected based on her performance on a task estimating premorbid functioning levels.  No major changes from previous testing, however.         The patient was administered the [place] Verbal Learning Test  - 3 and generated a normal range and positive learning curve over five repeated auditory word list learning trials.  An interference trial was within normal limits.  Free and cued, short and long delayed recall were within or above the normal range.  Recognition recall was borderline.  Forced choice recall as normal, suggesting good effort on this test.  This pattern of performance is not indicative of a patient who is at increased risk for day-to-day problems with auditory learning and/or memory.  No change from previous testing.       The patient???s performance on the copy portion of the Rey Complex Figure Test was within normal limits  (>16th %ile).  Recall for the complex design was within the normal range after a short delay, impaired after a long delay, and normal with respect to recognition recall.  This is generally consistent with previous testing and no major declines in visual memory are noted over time.        Simple timed visual  motor sequencing (Trailmaking Test Part A) was within the mildly impaired range with a T score of 37.  Her performance on a similar, but more complex task of timed visual motor sequencing (Trailmaking Test Part B) was within the moderately to severely impaired range with a T score of 21.  She made three sequencing errors and then the test was discontinued due to time.    Taken together, this pattern of performance is indicative of a patient who is at increased risk for day-to-day problems with executive functioning.  This is a decline from previous testing.       Motor speed for finger tapping was within the mildly impaired range bilaterally.  Fine motor dexterity was moderately to severely impaired bilaterally.  This is a decline in motor skills over time.       The patient rated her current level of pain as "2/5- Discomforting" on the McGill-Melzack Pain Questionnaire.  She reported pain in her neck, right arm, right wrist, lower back, lower abdomen, right leg.  She had previously rated her pain as 1/5- Mild     . Her Beck Depression Inventory ???II score of 33 was within the severely depressed range.  Her Beck Anxiety Inventory score of 41 reflected severe anxiety.       The patient was also administered the Personality Assessment Inventory and generated a valid profile for interpretation.  Within this context, there are numerous clinical scale elevations.  She reports psychosis on this measure, though she denied this on interview.  She reports experiencing unusual perceptual events and full blown hallucinations as well as unusual ideas that may include magical thinking or delusional beliefs.  She reports strong support for somatization  Marked anxiety is present.  There is strong support for PTSD.  Her level of anxiety is severe to the degree that her ability to concentrate and to attend are significantly compromised.   She has a history of involvement in intense  and volatile relationships and tends to be preoccupeid with consistent fears of being abandoned or rejected by those around her.  Interpersonally, she is withdrawn and introverted.  She reports periodic/transient thoughts of self-harm on this measure and denied currently active plan or intent with me.  She can be meek and unassertive at times.  She is motivated for treatment.      Impressions & Recommendations:  This patient has now undergone three evaluations of neurocognitive status.  The first was with another provider and the last two were here with me.  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 She is showing a decline in executive functioning and a decline in motor skills are also noted.  Thankfully, though, she is not showing a decline in memory.  This latter news is very reassuring.  From an emotional standpoint, she reports a decline in psychiatric status as well, despite engagement with psychiatry and psychology.  She reports severe depression, severe anxiety, somatization and conversion, and PTSD.  Her anxiety is  severe to the degree whereby her ability to attend and to concentrate is significantly compromised. She also reports psychosis on personality testing and denied this on interview.  Passive suicidal thinking is reported without current plan or intent.       Thankfully,this profile remains consistent with MCI and is not (yet) dementia.  My concern is for worsening psychiatric functioning  impacting her day-to-day cognition, especially her memory.   IF she is psychotic, then of course this is a new clinical target to address well.  A psychiatric review of her medication management for mood is advised.  I also recommend consideration for an appropriate medication for attention  if not medically contraindicated.  Continue active engagement in psychotherapy.  Consider TMS.  I have some concerns about her competency now, and suggest assignment of a POA if this has not been done so already.  I do not believe she is safe to drive and recommend a formal evaluation of driving safety.  Supervise/system should be in place for medications and finances.  Stay active mentally, physically, and socially.  I would like to see her again as soon as mood improves to better clarify this unusual MCI pattern.  We now have updated data on her.  Clinical correlation is, of course, indicated.       I will discuss these findings with the patient when she follows up with me in the near future.  A follow up Neuropsychological Evaluation is indicated on a prn basis, especially if there are any cognitive and/or emotional changes.      DIAGNOSES:  MCI     Depression, Severe     Anxiety, Severe     Somatization     Conversion     PTSD     Rule out Psychotic Disorder, NOS      The above information is based upon information currently available to me.  If there is any additional information of which I am currently unaware, I would be more than happy to review it upon having it made available to me.  Thank you for the opportunity to see this interesting individual.     Sincerely,       Radin Raptis A. Wynonia Hazard, PsyD, EdS    CC: Katherina Right, MD , Dr. Jettie Pagan    Time Documentation:      [phone] x1 &  [phone] x 1 Neuropsych testing/data gathering by Neuropsychologist (60 minutes)     96138 x 1  96139 x 7 Test Administration/Data Gathering By Technician: (4 hours). 09811 x 1 (first 30 minutes), 96138 x 7 (each additional 30 minutes)    96132 x 1  96133 x 1 Testing Evaluation Services by Neuropsychologist (1 hour, 50 minutes) 96132 x 1 (first hour), 96133 x 1 (50 minutes)    Definitions:      96116/96121:  Neurobehavioral Status Exam, Clinical interview.  Clinical  assessment of thinking, reasoning and judgment, by neuropsychologist, both face to face time with patient and time interpreting those test results and reporting, first and subsequent hours)    96138/96139: Neuropsychological Test Administration by Technician/Psychometrist, first 30 minutes and each additional 30 minutes.     The above includes: Record review.  Review of history provided by patient.  Review of collaborative information.  Testing by Clinician.  Review of raw data. Scoring. Report writing of individual tests administered by Clinician.  Integration of individual tests administered by psychometrist with NSE/testing by clinician, review of records/history/collaborative information, case Conceptualization, treatment planning, clinical decision making, report writing, coordination Of Care. Psychometry test codes as time spent by psychometrist administering and scoring neurocognitive/psychological tests under supervision of neuropsychologist.  Integral services including scoring of raw data, data interpretation, case conceptualization, report writing etcetera were initiated after the patient finished testing/raw data collected and was completed on the date the report was signed.

## 2019-01-12 ENCOUNTER — Encounter

## 2019-01-12 NOTE — Telephone Encounter (Signed)
Pt calling to check status of refill for gabapentin  Best # [phone]

## 2019-01-13 MED ORDER — GABAPENTIN 100 MG CAP
100 mg | ORAL_CAPSULE | ORAL | 5 refills | Status: DC
Start: 2019-01-13 — End: 2019-11-29

## 2019-01-18 NOTE — Progress Notes (Signed)
Follow-up Visit   Date: 01/19/19    DEAIRA [name] MRN: [id number] DOB: October 01, 1956   Interim History: Tammy [name] is a 63 y.o. 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  The patient was accompanied to the clinic by self.   History of present illness: She complains of numbness numbness and tingling of the hands which started about a year ago.  It involves her entire palmer side of the hands and both are affected equally. She also has cold sensation of the upper arms.  She has some weakness of her grip, such as holding a phone or opening jars.  Symptoms are worse at night time and often wake her up from sleeping.  She denies any chronic neck pain.  Currently, she takes gabapentin '300mg'$  three times daily and nortriptyline '25mg'$  , which does not provide any relief.  The only thing that provides some relief is applying steelwool on her hands to relieve some of the numbness.  She works as a Aeronautical engineer at [employer].  No prior history of diabetes or thyroid problems.  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 Because of clinical suspicion for CIDP, she had induction dose of IVMP and has noticed some improvement in her burning sensation.  Since then, however, she has new numbness over the right 5th digit. Over the past month, she has noticed painful cramps of the hands, which wakes her up from sleeping.   She is taking nortriptyline '25mg'$  at bedtime which makes her very sleepy.  She takes gabapentin '300mg'$  three times daily, but forgets to take it about twice per week. She denies any pain in the legs, imbalance, or weakness.  UPDATE 04/14/2018:  She is here for follow-up visit.  She has not been able to get medicaid, so did not get IVIG.  We have been  in contact with home infusion services who is waiting for medicaid number to process paperwork for IVIG.  Bilateral arms pain has been getting worse and her arm spasms/cramps are worse.  She denies any weakness or numbness/tingling of the legs.  She takes gabapentin '300mg'$  TID and nortriptyline '25mg'$  at bedtime with little relief.    UPDATE 09/27/2018:  She was approved for medicaid and started IVIG for the past three months.  She gets headaches with IVIG and takes ibuprofen.  Unfortunately, she has not noticed any marked improvement with her numbness/tingling of the hands.  Her hands continue to itch and tingle and despite being on gabapentin '1200mg'$ /d and nortriptyline 60mat bedtime, there is no improvement.  She does not feel that nortriptyline is helpful.  She complains moreso of bilateral shoulder "locks up", described as sharp pain when she tries to raise her arms above her shoulder. Pain is sometimes improved with ibuprofen.   UPDATE 01/18/2019:  She is here for follow-up visit for CIDP.  She continues to get home IVIG every 21 days and has noticed mild improvement and painful paresthesias.  She denies any new hand weakness, or progression of neuropathy symptoms in her arms or legs.  She does complain of dry scaly skin on her hands and continues to have cramping.  Medications:  Current Outpatient Medications on File Prior to Visit  Medication Sig Dispense Refill  . amLODipine (NORVASC) 5 MG tablet Take 1 tablet (  5 mg total) by mouth daily. 90 tablet 3  . atorvastatin (LIPITOR) 40 MG tablet Take 1 tablet (40 mg total) by mouth daily. 90 tablet 3  . clobetasol cream (TEMOVATE) 4.66 % Apply 1 application topically 2 (two) times daily. 60 g 3  . gabapentin (NEURONTIN) 300 MG capsule Take 1 tablet in the morning, 1 tablet in the afternoon, and 2 tablet at bedtime 120 capsule 11  . hydrOXYzine (ATARAX/VISTARIL) 25 MG tablet Take 1 tablet (25 mg total) by mouth 2 (two) times daily as needed. 30 tablet 2   . rivaroxaban (XARELTO) 10 MG TABS tablet Take 1 tablet (10 mg total) by mouth daily. 90 tablet 11  . XARELTO 20 MG TABS tablet Take 1 tablet (20 mg total) by mouth daily. 30 tablet 11   Current Facility-Administered Medications on File Prior to Visit  Medication Dose Route Frequency Provider Last Rate Last Dose  . methylPREDNISolone sodium succinate (SOLU-MEDROL) 1,000 mg in sodium chloride 0.9 % 50 mL IVPB  1,000 mg Intravenous Q28 days Alda Berthold, DO 58 mL/hr at 01/27/18 0928 1,000 mg at 01/27/18 5993    Allergies: No Known Allergies  Review of Systems:  CONSTITUTIONAL: No fevers, chills, night sweats, or weight loss.  EYES: No visual changes or eye pain ENT: No hearing changes.  No history of nose bleeds.   RESPIRATORY: No cough, wheezing and shortness of breath.   CARDIOVASCULAR: Negative for chest pain, and palpitations.   GI: Negative for abdominal discomfort, blood in stools or black stools.  No recent change in bowel habits.   GU:  No history of incontinence.   MUSCLOSKELETAL: +history of joint pain or swelling.  No myalgias.   SKIN: Negative for lesions, rash, and itching.   ENDOCRINE: Negative for cold or heat intolerance, polydipsia or goiter.   PSYCH:  No depression or anxiety symptoms.   NEURO: As Above.   Vital Signs:  BP 140/70   Pulse (!) 109   Temp 98.3 F (36.8 C) (Oral)   Ht '5\' 2"'$  (1.575 m)   Wt 186 lb 8 oz (84.6 kg)   SpO2 97%   BMI 34.11 kg/m   General Medical Exam:   General:  Well appearing, comfortable  Eyes/ENT: see cranial nerve examination.   Neck:   No carotid bruits. Respiratory:  Clear to auscultation, good air entry bilaterally.   Cardiac:  Regular rate and rhythm, no murmur.   Ext:  Dry skin in between web spaces of fingers.   Neurological Exam: MENTAL STATUS including orientation to time, place, person, recent and remote memory, attention span and concentration, language, and fund of knowledge is normal.  Speech is not dysarthric.   CRANIAL NERVES:  Pupils equal round and reactive to light.  Normal conjugate, extra-ocular eye movements in all directions of gaze.  No ptosis.   Face is symmetric.   MOTOR:  Motor strength is 5/5 in all extremities, except intrinsic hand muscles 5-/5.  No atrophy, fasciculations or abnormal movements.  No pronator drift.  Tone is normal.    MSRs:  Reflexes are 2+/4 throughout  SENSORY:  Reduced sensation to pin prick and temperature over the palmer surface bilaterally.   COORDINATION/GAIT:   Gait narrow based and stable.    Data: NCS/EMG of the arms 10/15/2017:  The electrophysiologic findings are most consistent with a subacute sensorimotor polyneuropathy, axon loss and demyelinating in type, affecting the upper extremities; these findings are moderate in degree electrically.  NCS/EMG of the legs 10/29/2017:  Normal  CSF 11/10/2017:  R1 W1 G75 P85*    IgG 0.64, MBP < 2, OCB + CSF and serum, cytology negative, ACE 4  Labs 10/29/2017:  ESR 72*, CRP <0.3, vitamin B12 434, vitamin B1 7*, copper 153, folate 15.6, MMA 122, ANA 1:80 (speckled), SPEP with IFE no M protein, ACE 77*, heavy metal screen  MRI cervical spine 01/20/2018: 1. Stable mild right foraminal narrowing at C6-7. 2. Slight progression of uncovertebral disease and mild right foraminal narrowing at C5-6. 3. No focal cord signal abnormality or thickening of nerve roots.   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  In January 2019, she was given induction course of IV methylprednisolone '1mg'$  x 5 days due to insurance changes, did not get IVMP again until the summer.    In September 2019, she was started in IVIG and has been on monthly maintenance therapy which has improved painful paresthesias.  She continues to have hand numbness and weakness. Continue IVIG 1kg/kg every 21 days If no further improvement, plan to  repeat NCS/EMG to guide ongoing therapy Continue gabapentin '300mg'$  in the morning, '300mg'$  in the afternoon, and '600mg'$  at bedtime  Thiamine deficiency in the setting of neuropathy.  No history of alcohol use.  Check vitamin B1 level, if normal ok to stop supplementation.  Return to clinic in 4 months      Thank you for allowing me to participate in patient's care.  If I can answer any additional questions, I would be pleased to do so.    Sincerely,    Lari Linson K. Posey Pronto, DO

## 2019-01-19 ENCOUNTER — Encounter: Payer: Self-pay | Admitting: Neurology

## 2019-01-19 ENCOUNTER — Other Ambulatory Visit: Payer: Self-pay

## 2019-01-19 ENCOUNTER — Other Ambulatory Visit (INDEPENDENT_AMBULATORY_CARE_PROVIDER_SITE_OTHER): Payer: Medicaid Other

## 2019-01-19 ENCOUNTER — Ambulatory Visit (INDEPENDENT_AMBULATORY_CARE_PROVIDER_SITE_OTHER): Payer: Medicaid Other | Admitting: Neurology

## 2019-01-19 VITALS — BP 140/70 | HR 109 | Temp 98.3°F | Ht 62.0 in | Wt 186.5 lb

## 2019-01-19 DIAGNOSIS — E519 Thiamine deficiency, unspecified: Secondary | ICD-10-CM

## 2019-01-19 DIAGNOSIS — G6181 Chronic inflammatory demyelinating polyneuritis: Secondary | ICD-10-CM

## 2019-01-19 NOTE — Patient Instructions (Signed)
You can try over the counter lidocaine cream to hands for severe pain  Continue IVIG every 21 days  Continue gabapentin as you are taking  Check vitamin B1  Return to clinic in 4 months

## 2019-01-23 LAB — VITAMIN B1: Vitamin B1 (Thiamine): 8 nmol/L (ref 8–30)

## 2019-01-24 ENCOUNTER — Telehealth: Payer: Self-pay | Admitting: *Deleted

## 2019-01-24 ENCOUNTER — Encounter: Payer: Self-pay | Admitting: *Deleted

## 2019-01-24 NOTE — Telephone Encounter (Signed)
Results sent via My Chart.  

## 2019-01-24 NOTE — Telephone Encounter (Signed)
-----   Message from Alda Berthold, DO sent at 01/24/2019  8:58 AM EDT ----- Please inform patient that her vitamin B1 is coming up, but still low-normal.  Continue to take thiamine (vitamin B1) 100mg  daily. Thanks.

## 2019-01-26 NOTE — Telephone Encounter (Signed)
-----   Message from Elease Etienne sent at 01/26/2019  1:03 PM EDT -----  Regarding: Dr Cristal Ford  General Message/Vendor Calls    Caller's first and last name:      Reason for call:would like to know if her test results are back yet  from her test she had on 01/10/2019       Callback required yes/no and why:yes for reason given above       Best contact number(s):[phone]      Details to clarify the request:      Elease Etienne

## 2019-01-28 NOTE — Telephone Encounter (Signed)
-----   Message from Rolland Bimler sent at 01/28/2019 10:29 AM EDT -----  Regarding: Dr. Michail Sermon  Pt returning a call. Contact is [phone]

## 2019-01-31 ENCOUNTER — Telehealth: Payer: Self-pay | Admitting: Neurology

## 2019-01-31 NOTE — Telephone Encounter (Signed)
I spoke with patient and she said that she would need Rx for Lidocaine.  Informed her Dr. Posey Pronto is not in today but that I will send something in tomorrow.

## 2019-01-31 NOTE — Telephone Encounter (Signed)
Patient states that we told her to pick up some kind of cream over the counter and she has been told she needs RX for this.   She uses the [facility]

## 2019-02-01 ENCOUNTER — Other Ambulatory Visit: Payer: Self-pay | Admitting: *Deleted

## 2019-02-01 MED ORDER — LIDOCAINE 5 % EX OINT: TOPICAL_OINTMENT | Freq: Four times a day (QID) | CUTANEOUS | Status: DC | PRN

## 2019-02-01 NOTE — Telephone Encounter (Signed)
Lidocaine does come over the counter (Salonpas, Aspercream).  She may want to try OTC first as this will be cheaper. OK to send prescriptions lidocaine, if no improvement.

## 2019-02-01 NOTE — Telephone Encounter (Signed)
Rx sent in for lidocaine.  Ok per Dr. Posey Pronto.

## 2019-02-01 NOTE — Telephone Encounter (Signed)
Ok to send in Rx?

## 2019-02-04 ENCOUNTER — Other Ambulatory Visit: Payer: Self-pay | Admitting: *Deleted

## 2019-02-04 MED ORDER — LIDOCAINE 5 % EX OINT: 1.0000 "application " | TOPICAL_OINTMENT | Freq: Three times a day (TID) | CUTANEOUS | 1 refills | Status: DC | PRN

## 2019-02-04 MED FILL — LIDOCAINE 5 % OINT: 5 | 30 days supply | Qty: 35 | Fill #0

## 2019-02-08 ENCOUNTER — Other Ambulatory Visit: Payer: Self-pay

## 2019-02-08 ENCOUNTER — Telehealth: Payer: Self-pay | Admitting: Neurology

## 2019-02-08 ENCOUNTER — Ambulatory Visit: Payer: Medicaid Other | Attending: Internal Medicine | Admitting: Internal Medicine

## 2019-02-08 DIAGNOSIS — E785 Hyperlipidemia, unspecified: Secondary | ICD-10-CM | POA: Diagnosis not present

## 2019-02-08 DIAGNOSIS — R079 Chest pain, unspecified: Secondary | ICD-10-CM | POA: Diagnosis not present

## 2019-02-08 DIAGNOSIS — F411 Generalized anxiety disorder: Secondary | ICD-10-CM | POA: Diagnosis not present

## 2019-02-08 DIAGNOSIS — H5789 Other specified disorders of eye and adnexa: Secondary | ICD-10-CM

## 2019-02-08 DIAGNOSIS — I2782 Chronic pulmonary embolism: Secondary | ICD-10-CM | POA: Diagnosis not present

## 2019-02-08 DIAGNOSIS — Z86711 Personal history of pulmonary embolism: Secondary | ICD-10-CM | POA: Insufficient documentation

## 2019-02-08 DIAGNOSIS — Z79899 Other long term (current) drug therapy: Secondary | ICD-10-CM | POA: Insufficient documentation

## 2019-02-08 DIAGNOSIS — Z87891 Personal history of nicotine dependence: Secondary | ICD-10-CM | POA: Diagnosis not present

## 2019-02-08 DIAGNOSIS — Z86718 Personal history of other venous thrombosis and embolism: Secondary | ICD-10-CM | POA: Insufficient documentation

## 2019-02-08 DIAGNOSIS — Z8249 Family history of ischemic heart disease and other diseases of the circulatory system: Secondary | ICD-10-CM | POA: Diagnosis not present

## 2019-02-08 DIAGNOSIS — I10 Essential (primary) hypertension: Secondary | ICD-10-CM | POA: Diagnosis not present

## 2019-02-08 DIAGNOSIS — Z7901 Long term (current) use of anticoagulants: Secondary | ICD-10-CM | POA: Insufficient documentation

## 2019-02-08 MED ORDER — ATORVASTATIN CALCIUM 40 MG PO TABS: 40.0000 mg | ORAL_TABLET | Freq: Every day | ORAL | 3 refills | Status: DC

## 2019-02-08 MED ORDER — HYDROXYZINE HCL 25 MG PO TABS: 25.0000 mg | ORAL_TABLET | Freq: Two times a day (BID) | ORAL | 6 refills | Status: DC | PRN

## 2019-02-08 MED FILL — XARELTO 20 MG TABLET: 20 | 30 days supply | Qty: 30 | Fill #3

## 2019-02-08 MED FILL — hydrOXYzine HCL 25 MG TABS: 25 | 15 days supply | Qty: 30 | Fill #0

## 2019-02-08 MED FILL — ATORVASTATIN 40 MG TABLET: 40 | 90 days supply | Qty: 90 | Fill #0

## 2019-02-08 MED FILL — AMLODIPINE BESYLATE 5 MG TA: 5 | 30 days supply | Qty: 30 | Fill #2

## 2019-02-08 NOTE — Progress Notes (Signed)
Virtual Visit via Telephone Note  I connected with Tammy [name] on 02/08/19 at 8:34 a.m by telephone from my office and verified that I am speaking with the correct person using two identifiers. Pt is at home.   I discussed the limitations, risks, security and privacy concerns of performing an evaluation and management service by telephone and the availability of in person appointments. I also discussed with the patient that there may be a patient responsible charge related to this service. The patient expressed understanding and agreed to proceed.   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  Noticed what looked like white pus on the lid and the lid was matted shut.  Eye was a little sore and slight blurred vision.  She used warm towel to the eye for a few hrs. Denies itchy eyes.   She has not noticed any drainage or crusting since the first day.  Redness in the eye has not spread all over and has actually receded.  C/o intermittent tightness in the center of chest for past 2 yrs.  "It comes when it wants to which is why I don't say nothing."  Occurs a few times a mth and last 1-3 hrs.  Last episode occurred 3 days ago and last 3 hrs. Sometimes can go into RT arm.  No SOB. Usually occurs when she is stressed out. Can occur at rest and when with exertion. "I just pray and keep going."  -Fhx of CAD in 4 siblings. Reports she quit smoking in Dec after our last visit.    DVT/PE: no bruising or bleeding other than what she saw in RT eye 3 days ago.  Patient is on Xarelto.  HTN:  Started on Norvasc 5 mg on last visit.   Home nurse checks BP Q 3 wks when she gives IVIG treatment.  She reports BP readings have been good.  SBP has not been over 120.  She limits salt in foods +CP as discussed above. Slight LE edema when she does a  lot of standing.    Anxiety:  Refills on Hydroxyzine requested.  She feels she is doing well on the medication which she takes as needed.    Observations/Objective: No direct observation done as this was a telephone encounter.  Assessment and Plan: 1. Chest pain in adult Pt with RFs for HD to include tob dep (recently quit), HL, strong Fhx.  I recommend taking a 81 mg ASA daily and referral to cardiology. Offered pt to come into office today so that I can do an EKG but she stated that she is unable today.  I am out of office the rest of the wk.  She will come as nurse only visit on Monday for EKG - Ambulatory referral to Cardiology  2. Essential hypertension Reported home BP readings are at goal.  Continue amlodipine and low-salt diet  3. Dyslipidemia - atorvastatin (LIPITOR) 40 MG tablet; Take 1 tablet (40 mg total) by mouth daily.  Dispense: 90 tablet; Refill: 3  4. Generalized anxiety disorder - hydrOXYzine (ATARAX/VISTARIL) 25 MG tablet; Take 1 tablet (25 mg total) by mouth 2 (two) times daily as needed.  Dispense: 30 tablet; Refill: 6  5. Former tobacco use Commended her on quitting and encouraged her to remain tobacco free Less than 5  minutes spent on counseling.  6. History of DVT/PE in adulthood On lifelong anticoagulation with Xarelto  7. Red eye Sounds like this may have been a viral vs bacterial conjunctivitis.  However I would have anticipated that it would have progressed if it was the latter.  Other possibilities is subconjunctival hemorrhage.  Patient told to follow-up if this does not continue to improve or if it flares up again.   Follow Up Instructions: F/u in 2 mths   I discussed the assessment and treatment plan with the patient. The patient was provided an opportunity to ask questions and all were answered. The patient agreed with the plan and demonstrated an understanding of the instructions.   The patient was advised to call back or seek an in-person  evaluation if the symptoms worsen or if the condition fails to improve as anticipated.  I provided 24 minutes of non-face-to-face time during this encounter.   Karle Plumber, MD

## 2019-02-08 NOTE — Telephone Encounter (Signed)
Patient called regarding her hand cream that she was told about here at the office. She went to pick it up and they told her she needs a prescription for it. She uses [facility] Please Call. Thanks

## 2019-02-08 NOTE — Telephone Encounter (Signed)
Called the pharmacy and they have the medication ready.  They are waiting for patient to pick up or call if she would like it mailed to her.  Patient notified and will pick it up today.

## 2019-02-14 ENCOUNTER — Ambulatory Visit: Payer: Medicaid Other | Attending: Internal Medicine | Admitting: Emergency Medicine

## 2019-02-14 ENCOUNTER — Other Ambulatory Visit: Payer: Self-pay

## 2019-02-14 VITALS — BP 142/86 | HR 118 | Temp 98.4°F | Resp 18 | Ht 62.0 in | Wt 184.6 lb

## 2019-02-14 DIAGNOSIS — I10 Essential (primary) hypertension: Secondary | ICD-10-CM

## 2019-02-14 NOTE — Progress Notes (Signed)
Patients call returned.  Patient identified by name and date of birth.  Patient here for ECG.  ECG completed and handed to Dr. Wynetta Emery

## 2019-03-03 ENCOUNTER — Telehealth: Payer: Self-pay | Admitting: Cardiology

## 2019-03-03 NOTE — Telephone Encounter (Signed)
New Message  Please call to help setup for virtual appointment.

## 2019-03-04 NOTE — Telephone Encounter (Signed)
Informed pt that she will receive text link to phone for video visit. Pt voiced understanding.

## 2019-03-08 ENCOUNTER — Telehealth: Payer: Self-pay | Admitting: Cardiology

## 2019-03-08 NOTE — Telephone Encounter (Signed)
Smartphone/consent/ my chart/ pre reg completed °

## 2019-03-09 ENCOUNTER — Encounter: Payer: Self-pay | Admitting: Cardiology

## 2019-03-09 ENCOUNTER — Telehealth (INDEPENDENT_AMBULATORY_CARE_PROVIDER_SITE_OTHER): Payer: Medicaid Other | Admitting: Cardiology

## 2019-03-09 VITALS — Ht 62.0 in | Wt 184.0 lb

## 2019-03-09 DIAGNOSIS — Z7189 Other specified counseling: Secondary | ICD-10-CM | POA: Diagnosis not present

## 2019-03-09 DIAGNOSIS — E785 Hyperlipidemia, unspecified: Secondary | ICD-10-CM | POA: Diagnosis not present

## 2019-03-09 DIAGNOSIS — I10 Essential (primary) hypertension: Secondary | ICD-10-CM | POA: Diagnosis not present

## 2019-03-09 DIAGNOSIS — R072 Precordial pain: Secondary | ICD-10-CM

## 2019-03-09 DIAGNOSIS — Z01812 Encounter for preprocedural laboratory examination: Secondary | ICD-10-CM | POA: Diagnosis not present

## 2019-03-09 MED ORDER — METOPROLOL TARTRATE 50 MG PO TABS: ORAL_TABLET | ORAL | 0 refills | Status: DC

## 2019-03-09 NOTE — Progress Notes (Signed)
Virtual Visit via Telephone Note   This visit type was conducted due to national recommendations for restrictions regarding the COVID-19 Pandemic (e.g. social distancing) in an effort to limit this patient's exposure and mitigate transmission in our community.  Due to her co-morbid illnesses, this patient is at least at moderate risk for complications without adequate follow up.  This format is felt to be most appropriate for this patient at this time.  The patient did not have access to video technology/had technical difficulties with video requiring transitioning to audio format only (telephone).  All issues noted in this document were discussed and addressed.  No physical exam could be performed with this format.  Please refer to the patient's chart for her  consent to telehealth for [facility].   Date:  03/09/2019   ID:  Tammy [name], DOB 20-Jan-1956, MRN [id number]  Patient Location: Home Provider Location: Home  PCP:  [name], MD  Cardiologist:  Buford Dresser, MD New Electrophysiologist:  None   Evaluation Performed:  New Patient Evaluation  Chief Complaint:  Chest tightneess  History of Present Illness:    Tammy [name] is a 63 y.o. female with PMH HTN, HLD, prior tobacco use who is seen as a new consult at the request of Dr. Karle Plumber for the evaluation of chest pain.  The patient does not have symptoms concerning for COVID-19 infection (fever, chills, cough, or new shortness of breath).   Chest pain: -Initial onset: off and on for a while, several years, always told it was muscle spasm -Quality: right sided, feels like someone is squeezing on her right side, hurts worse when she pushes on her chest while she is having the pain -Frequency: "whenever it wants to". Happens every Friday when she is at work, thinks it's stress related, no heavy lifting. 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 -Duration: all day -Associated symptoms: feels short of breath  mildly with it, no other symptoms -Aggravating/alleviating factors: pushing on it makes it worse, better if she rest when she rests -Prior cardiac history: none -Prior ECG: sinus -Prior workup: no cardiac workup, has had CT for PE before -Prior treatment: none -Alcohol: none -Tobacco: quit smoking 3 years ago. -diet: a lot of fruit, salad. Not a lot of meat, avoids fatty foods. -Comorbidities: no diabetes, no kidney disease. Does have HTN, HLD -Exercise level: walks routinely, goes up and down stairs all day, no issues.  -Cardiac ROS: no shortness of breath, no PND, no orthopnea, no LE edema, no syncope -Family history: sister with heart problems, died of cancer. Son has a "stimulator" in his heart. Older brother has cardiac stents, younger brother has stents in his heart. Another brother had bypass surgery. Another sister had open heart surgery.  Denies chest pain, shortness of breath at rest or with normal exertion. No PND, orthopnea, or unexpected weight gain. No syncope or palpitations. Has chronic mild LE edema (sock lines). Working on weight loss, was 192 lbs.   Past Medical History:  Diagnosis Date   Chronic headaches    DVT (deep venous thrombosis) (HCC)    Leukemia (HCC)    Told she had it at age 10, given some shots   Pulmonary embolism ([place])    Seasonal allergies    Past Surgical History:  Procedure Laterality Date   CESAREAN SECTION     one previous   right arm fracture     TUBAL LIGATION       Current Meds  Medication Sig   amLODipine (NORVASC) 5 MG tablet Take 1 tablet (5 mg total) by mouth daily.   atorvastatin (LIPITOR) 40 MG tablet Take 1 tablet (40 mg total) by mouth daily.   clobetasol cream (TEMOVATE) 4.85 % Apply 1 application topically 2 (two) times daily.   gabapentin (NEURONTIN) 300 MG capsule Take 1 tablet in the morning, 1 tablet in the afternoon, and 2 tablet at bedtime   hydrOXYzine (ATARAX/VISTARIL) 25 MG tablet Take 1 tablet (25 mg  total) by mouth 2 (two) times daily as needed.   lidocaine (XYLOCAINE) 5 % ointment Apply 1 application topically 3 (three) times daily as needed.   rivaroxaban (XARELTO) 10 MG TABS tablet Take 1 tablet (10 mg total) by mouth daily.   Current Facility-Administered Medications for the 03/09/19 encounter (Telemedicine) with Buford Dresser, MD  Medication   lidocaine (XYLOCAINE) 5 % ointment   methylPREDNISolone sodium succinate (SOLU-MEDROL) 1,000 mg in sodium chloride 0.9 % 50 mL IVPB     Allergies:   Patient has no known allergies.   Social History   Tobacco Use   Smoking status: Light Tobacco Smoker    Packs/day: 0.00    Years: 40.00    Pack years: 0.00    Types: Cigarettes   Smokeless tobacco: Never Used   Tobacco comment: Patient reports smoking 2-3 cigarettes a day   Substance Use Topics   Alcohol use: No   Drug use: No     Family Hx: The patient's family history includes Breast cancer in her maternal grandmother and mother; Cancer in her sister; Diabetes in her brother; Emphysema in her father; Heart disease in her sister.  ROS:   Please see the history of present illness.    Constitutional: Negative for chills, fever, night sweats, unintentional weight loss  HENT: Negative for ear pain and hearing loss.   Eyes: Negative for loss of vision and eye pain.  Respiratory: Negative for cough, sputum, wheezing.   Cardiovascular: See HPI. Gastrointestinal: Negative for abdominal pain, melena, and hematochezia.  Genitourinary: Negative for dysuria and hematuria.  Musculoskeletal: Negative for falls and myalgias.  Skin: Negative for itching and rash.  Neurological: Negative for focal weakness, focal sensory changes and loss of consciousness.  Endo/Heme/Allergies: Does not bruise/bleed easily.  All other systems reviewed and are negative.   Prior CV studies:   The following studies were reviewed today: Echo 2016  Labs/Other Tests and Data Reviewed:    EKG:   An ECG dated 02/14/19 was personally reviewed today and demonstrated:  sinus tachycardia at 113 bpm  Recent Labs: 06/29/2018: ALT 17; BUN 9; Creatinine, Ser 1.15; Hemoglobin 13.0; Platelets 302; Potassium 4.6; Sodium 142   Recent Lipid Panel Lab Results  Component Value Date/Time   CHOL 221 (H) 05/13/2017 09:56 AM   TRIG 326 (H) 05/13/2017 09:56 AM   HDL 40 05/13/2017 09:56 AM   CHOLHDL 5.5 (H) 05/13/2017 09:56 AM   CHOLHDL 7.2 (H) 12/24/2015 11:09 AM   LDLCALC 116 (H) 05/13/2017 09:56 AM    Wt Readings from Last 3 Encounters:  03/09/19 184 lb (83.5 kg)  02/14/19 184 lb 9.6 oz (83.7 kg)  01/19/19 186 lb 8 oz (84.6 kg)     Objective:    Vital Signs:  Ht 5\' 2"  (1.575 m)    Wt 184 lb (83.5 kg)    BMI 33.65 kg/m  142/90 Speaking comfortably over the phone, in no acute distress  ASSESSMENT & PLAN:   Chest pain: right sided pain is atypical,  but she has risk factors and a very strong family history -CT coronary for definitive evaluation of chest pain -pre procedure BMET -one time metoprolol dose ordered -instructed on red flag warning signs that need immediate medical attention  Hypertension: reports to me as 142/90 -over goal today, but she reports feeling anxious. Continue to monitor. Reported in PCP notes as being well controlled -continue amlodipine 5 mg daily  Hyperlipidemia -update lipids (last in 2018) when she has BMET done so that we can accurately assess her risk -written for atorvastatin 40 mg daily, has taken intermittently in the past.  History of DVT/PE: on rivaroxaban  COVID-19 Education: The signs and symptoms of COVID-19 were discussed with the patient and how to seek care for testing (follow up with PCP or arrange E-visit).  The importance of social distancing was discussed today.     Patient Instructions  Medication Instructions:  Your Physician recommend you continue on your current medication as directed.    If you need a refill on your cardiac  medications before your next appointment, please call your pharmacy.   Lab work: Your physician recommends that you return for lab work 1 week prior to procedure. (BMP, Lipid)  If you have labs (blood work) drawn today and your tests are completely normal, you will receive your results only by:  MyChart Message (if you have MyChart) OR  A paper copy in the mail If you have any lab test that is abnormal or we need to change your treatment, we will call you to review the results.  Testing/Procedures: Your physician has requested that you have cardiac CT. Cardiac computed tomography (CT) is a painless test that uses an x-ray machine to take clear, detailed pictures of your heart. For further information please visit HugeFiesta.[email or web address]. Please follow instruction sheet as given. [facility]    Follow-Up: At [facility], you and your health needs are our priority.  As part of our continuing mission to provide you with exceptional heart care, we have created designated Provider Care Teams.  These Care Teams include your primary Cardiologist (physician) and Advanced Practice Providers (APPs -  Physician Assistants and Nurse Practitioners) who all work together to provide you with the care you need, when you need it. You will need a follow up appointment in 2 months.  Please call our office 2 months in advance to schedule this appointment.  You may see Buford Dresser, MD or one of the following Advanced Practice Providers on your designated Care Team:   Rosaria Ferries, PA-C  Jory Sims, DNP, ANP  Please arrive at the [facility] main entrance of [facility] at xx:xx AM (30-45 minutes prior to test start time)  [facility] [place], [place] [address] [phone]  Proceed to the [facility] Radiology Department (First Floor).  Please follow these instructions carefully (unless otherwise directed):  On the Night Before the  Test:  Be sure to Drink plenty of water.  Do not consume any caffeinated/decaffeinated beverages or chocolate 12 hours prior to your test.  Do not take any antihistamines 12 hours prior to your test.   On the Day of the Test:  Drink plenty of water. Do not drink any water within one hour of the test.  Do not eat any food 4 hours prior to the test.  You may take your regular medications prior to the test.   Take metoprolol (Lopressor) two hours prior to test.  After the Test:  Drink plenty of water.  After receiving IV contrast, you may experience a mild flushed feeling. This is normal.  On occasion, you may experience a mild rash up to 24 hours after the test. This is not dangerous. If this occurs, you can take Benadryl 25 mg and increase your fluid intake.  If you experience trouble breathing, this can be serious. If it is severe call 911 IMMEDIATELY. If it is mild, please call our office.  If you take any of these medications: Glipizide/Metformin, Avandament, Glucavance, please do not take 48 hours after completing test.      Medication Adjustments/Labs and Tests Ordered: Current medicines are reviewed at length with the patient today.  Concerns regarding medicines are outlined above.   Tests Ordered: Orders Placed This Encounter  Procedures   CT CORONARY MORPH W/CTA COR W/SCORE W/CA W/CM &/OR WO/CM   CT CORONARY FRACTIONAL FLOW RESERVE DATA PREP   CT CORONARY FRACTIONAL FLOW RESERVE FLUID ANALYSIS   Basic metabolic panel   Lipid panel    Medication Changes: Meds ordered this encounter  Medications   metoprolol tartrate (LOPRESSOR) 50 MG tablet    Sig: TAKE 1 TABLET 2 HR PRIOR TO CARDIAC PROCEDURE    Dispense:  1 tablet    Refill:  0    Disposition:  Follow up 2 mos to review results of test.  Signed, Buford Dresser, MD  03/09/2019 2:18 PM    [facility]

## 2019-03-09 NOTE — Patient Instructions (Addendum)
Medication Instructions:  Your Physician recommend you continue on your current medication as directed.    If you need a refill on your cardiac medications before your next appointment, please call your pharmacy.   Lab work: Your physician recommends that you return for lab work 1 week prior to procedure. (BMP, Lipid)  If you have labs (blood work) drawn today and your tests are completely normal, you will receive your results only by: Marland Kitchen MyChart Message (if you have MyChart) OR . A paper copy in the mail If you have any lab test that is abnormal or we need to change your treatment, we will call you to review the results.  Testing/Procedures: Your physician has requested that you have cardiac CT. Cardiac computed tomography (CT) is a painless test that uses an x-ray machine to take clear, detailed pictures of your heart. For further information please visit HugeFiesta.tn. Please follow instruction sheet as given. [facility]    Follow-Up: At [facility], you and your health needs are our priority.  As part of our continuing mission to provide you with exceptional heart care, we have created designated Provider Care Teams.  These Care Teams include your primary Cardiologist (physician) and Advanced Practice Providers (APPs -  Physician Assistants and Nurse Practitioners) who all work together to provide you with the care you need, when you need it. You will need a follow up appointment in 2 months.  Please call our office 2 months in advance to schedule this appointment.  You may see Buford Dresser, MD or one of the following Advanced Practice Providers on your designated Care Team:   Rosaria Ferries, PA-C . Jory Sims, DNP, ANP  Please arrive at the [facility] main entrance of [facility] at xx:xx AM (30-45 minutes prior to test start time)  [facility] [place], [place] [address] [phone]  Proceed to the [facility]  Radiology Department (First Floor).  Please follow these instructions carefully (unless otherwise directed):  On the Night Before the Test: . Be sure to Drink plenty of water. . Do not consume any caffeinated/decaffeinated beverages or chocolate 12 hours prior to your test. . Do not take any antihistamines 12 hours prior to your test.   On the Day of the Test: . Drink plenty of water. Do not drink any water within one hour of the test. . Do not eat any food 4 hours prior to the test. . You may take your regular medications prior to the test.  . Take metoprolol (Lopressor) two hours prior to test.  After the Test: . Drink plenty of water. . After receiving IV contrast, you may experience a mild flushed feeling. This is normal. . On occasion, you may experience a mild rash up to 24 hours after the test. This is not dangerous. If this occurs, you can take Benadryl 25 mg and increase your fluid intake. . If you experience trouble breathing, this can be serious. If it is severe call 911 IMMEDIATELY. If it is mild, please call our office. . If you take any of these medications: Glipizide/Metformin, Avandament, Glucavance, please do not take 48 hours after completing test.

## 2019-03-14 ENCOUNTER — Encounter: Payer: Self-pay | Admitting: Cardiology

## 2019-03-14 DIAGNOSIS — R072 Precordial pain: Secondary | ICD-10-CM | POA: Insufficient documentation

## 2019-03-14 DIAGNOSIS — I10 Essential (primary) hypertension: Secondary | ICD-10-CM | POA: Insufficient documentation

## 2019-03-14 DIAGNOSIS — E785 Hyperlipidemia, unspecified: Secondary | ICD-10-CM | POA: Insufficient documentation

## 2019-04-01 ENCOUNTER — Telehealth: Attending: Clinical Neuropsychologist | Primary: Family Medicine

## 2019-04-01 ENCOUNTER — Telehealth: Admit: 2019-04-01 | Payer: PRIVATE HEALTH INSURANCE | Attending: Clinical Neuropsychologist | Primary: Family Medicine

## 2019-04-01 DIAGNOSIS — G3184 Mild cognitive impairment, so stated: Secondary | ICD-10-CM

## 2019-04-01 NOTE — Progress Notes (Signed)
This is a teleneuropsychology (audio/visual) visit that was performed with in the originating site at patient's home and the distance site at [facility] outpatient clinic at [place].   Verbal consent to participate in the video visit was obtained.  This visit occurred during the corona (COVID -19) public health emergency and these visits were authorized by the President of the [place].   I discussed with the patient the nature of our teleneuropsych visit in that :    - I would evaluate the patient and recommend diagnostics and treatment based on my assessment and impressions, and/or provided test results and discussed these issues with the patient and/or family.    - Our sessions are not being recorded and that personal health information is protected    - Our team will provide follow-up care in person if when the patient needs it.    Prior to seeing the patient I reviewed the records, including the previously completed report, the records in [place], and any updated visits from other providers since I saw the patient last.      Today, I engaged in a psychoeducational and supportive psychotherapy and feedback session with the patient via teleneuropsychology.   I reviewed the results of the recent Neuropsychological Evaluation, including discussing individual tests as well as patient's areas of neurocognitive strength versus weakness.    We discussed, in detail, the following:       This patient has now undergone three evaluations of neurocognitive status.  The first was with another provider and the last two were here with me.  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 She is showing a decline in executive functioning and a decline in motor skills are also noted.  Thankfully, though, she is not showing a decline in memory.  This latter  news is very reassuring.  From an emotional standpoint, she reports a decline in psychiatric status as well, despite engagement with psychiatry and psychology.  She reports severe depression, severe anxiety, somatization and conversion, and PTSD.  Her anxiety is  severe to the degree whereby her ability to attend and to concentrate is significantly compromised. She also reports psychosis on personality testing and denied this on interview.  Passive suicidal thinking is reported without current plan or intent.    ??              Thankfully,this profile remains consistent with MCI and is not (yet) dementia.  My concern is for worsening psychiatric functioning impacting her day-to-day cognition, especially her memory.   IF she is psychotic, then of course this is a new clinical target to address well.  A psychiatric review of her medication management for mood is advised.  I also recommend consideration for an appropriate medication for attention if not medically contraindicated.  Continue active engagement in psychotherapy.  Consider TMS.  I have some concerns about her competency now, and suggest assignment of a POA if this has not been done so already.  I do not believe she is safe to drive and recommend a formal evaluation of driving safety.  Supervise/system should be in place for medications and finances.  Stay active mentally, physically, and socially.  I would like to see her again as soon as mood improves to better clarify this unusual MCI pattern.  We now have updated data on her.  Clinical  correlation is, of course, indicated.    ??              I will discuss these findings with the patient when she follows up with me in the near future.  A follow up Neuropsychological Evaluation is indicated on a prn basis, especially if there are any cognitive and/or emotional changes.    ??  DIAGNOSES:             MCI                                      Depression, Severe                                      Anxiety,  Severe                                      Somatization                                      Conversion                                      PTSD                                      Rule out Psychotic Disorder, NOS         Education was provided regarding my diagnostic impressions, and we discussed treatment plan/options.   I also answered numerous questions related to the clinical findings, including discussing various methods to improve cognition and mood.  Counseling provided regarding mood and cognition.   CBT and supportive psychotherapy techniques were utilized.  Supportive/Cognitive Behavioral/Solution Focused psychotherapy provided  Discussed rational versus irrational thinking patterns and their consequences.Discussed healthy/adaptive and unhealthy/maladaptive coping.      The patient needs to follow with psychiatry. It is reassuring that tehre is no dementia here.  Marked, if not severe psychiatric issues impacting day-to-day cognition, along with attentional issues, and discussed this and need for long term psychotherapy    Specific areas which were addressed include: Mood issues interfering with day-to-day cognition, need for extensive treatment.  TMS should be considered    The patient had the following concerns which I deferred to their referring provider: meds and TMS.  She is switching to a different Neurologist and would like me to send the report to him once she gets his name, and that's fine with me.      Time spent today:  25    Pursuant to the emergency declaration under the [employer] Act and the [employer] Emergencies Act, 1135 waiver authority and the [employer]onavirus Preparedness and [employer] Supplemental Appropriations Act, this Virtual  Visit (audio/visual) was conducted, with patient's consent, to reduce the patient's risk of exposure to COVID-19 and provide continuity of care.     Services were provided in this manner to substitute for in-person clinic visit.

## 2019-04-01 NOTE — Progress Notes (Signed)
This is a teleneuropsychology (audio/visual) visit that was performed with in the originating site at patient's home and the [facility].   Verbal consent to participate in the video visit was obtained.  This visit occurred during the corona (COVID -19) public health emergency and these visits were authorized by the President [place].   I discussed with the patient the nature of our teleneuropsych visit in that :    - I would evaluate the patient and recommend diagnostics and treatment based on my assessment and impressions, and/or provided test results and discussed these issues with the patient and/or family.    - Our sessions are not being recorded and that personal health information is protected    - Our team will provide follow-up care in person if when the patient needs it.    Prior to seeing the patient I reviewed the records, including the previously completed report, the records in [facility], and any updated visits from other providers since I saw the patient last.      Today, I engaged in a psychoeducational and supportive psychotherapy and feedback session with the patient via teleneuropsychology.   I reviewed the results of the recent Neuropsychological Evaluation, including discussing individual tests as well as patient's areas of neurocognitive strength versus weakness.    We discussed, in detail, the following:       This patient has now undergone three evaluations of neurocognitive status.  The first was with another provider and the last two were here with me.  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 She is showing a decline in executive functioning and a decline in motor skills are also noted.  Thankfully, though, she is not showing a decline  in memory.  This latter news is very reassuring.  From an emotional standpoint, she reports a decline in psychiatric status as well, despite engagement with psychiatry and psychology.  She reports severe depression, severe anxiety, somatization and conversion, and PTSD.  Her anxiety is  severe to the degree whereby her ability to attend and to concentrate is significantly compromised. She also reports psychosis on personality testing and denied this on interview.  Passive suicidal thinking is reported without current plan or intent.    ??              Thankfully,this profile remains consistent with MCI and is not (yet) dementia.  My concern is for worsening psychiatric functioning impacting her day-to-day cognition, especially her memory.   IF she is psychotic, then of course this is a new clinical target to address well.  A psychiatric review of her medication management for mood is advised.  I also recommend consideration for an appropriate medication for attention if not medically contraindicated.  Continue active engagement in psychotherapy.  Consider TMS.  I have some concerns about her competency now, and suggest assignment of a POA if this has not been done so already.  I do not believe she is safe to drive and recommend a formal evaluation of driving safety.  Supervise/system should be in place for medications and finances.  Stay active mentally, physically, and socially.  I would like to see her again as soon as mood improves to better clarify this unusual MCI pattern.  We now have updated data on her.  Clinical  correlation is, of course, indicated.    ??              I will discuss these findings with the patient when she follows up with me in the near future.  A follow up Neuropsychological Evaluation is indicated on a prn basis, especially if there are any cognitive and/or emotional changes.    ??  DIAGNOSES:             MCI                                      Depression, Severe                                       Anxiety, Severe                                      Somatization                                      Conversion                                      PTSD                                      Rule out Psychotic Disorder, NOS         Education was provided regarding my diagnostic impressions, and we discussed treatment plan/options.   I also answered numerous questions related to the clinical findings, including discussing various methods to improve cognition and mood.  Counseling provided regarding mood and cognition.   CBT and supportive psychotherapy techniques were utilized.  Supportive/Cognitive Behavioral/Solution Focused psychotherapy provided  Discussed rational versus irrational thinking patterns and their consequences.Discussed healthy/adaptive and unhealthy/maladaptive coping.      The patient needs to follow with psychiatry. It is reassuring that tehre is no dementia here.  Marked, if not severe psychiatric issues impacting day-to-day cognition, along with attentional issues, and discussed this and need for long term psychotherapy    Specific areas which were addressed include: Mood issues interfering with day-to-day cognition, need for extensive treatment.  TMS should be considered    The patient had the following concerns which I deferred to their referring provider: meds and TMS.  She is switching to a different Neurologist and would like me to send the report to him once she gets his name, and that's fine with me.      Time spent today:  25    Pursuant to the emergency declaration under the [employer] and the [employer] waiver authority and the [employer] Act, this Virtual  Visit (audio/visual) was conducted, with patient's consent, to reduce the patient's risk of exposure to COVID-19 and provide continuity of care.      Services were provided in this manner to substitute for in-person clinic visit.

## 2019-04-04 ENCOUNTER — Telehealth: Payer: Self-pay | Admitting: Cardiology

## 2019-04-04 NOTE — Telephone Encounter (Signed)
Left message to call and schedule ct

## 2019-04-05 MED FILL — hydrOXYzine HCL 25 MG TABS: 25 | 15 days supply | Qty: 30 | Fill #1

## 2019-04-05 MED FILL — AMLODIPINE BESYLATE 5 MG TA: 5 | 30 days supply | Qty: 30 | Fill #3

## 2019-04-05 MED FILL — XARELTO 20 MG TABLET: 20 | 30 days supply | Qty: 30 | Fill #4

## 2019-04-12 NOTE — Telephone Encounter (Signed)
Dr

## 2019-04-12 NOTE — Telephone Encounter (Signed)
-----   Message from Renato Gails sent at 04/12/2019  2:59 PM EDT -----  Regarding: Dr. Cristal Ford  General Message/Vendor Calls    Caller's first and last name:      Reason for call: Verify if last office notes were sent to Dr. Clelia Croft, pt psychiatrist.       Juliann Pulse required yes/no and why: yes      Best contact number(s): [phone]      Details to clarify the request:      Renato Gails

## 2019-04-13 NOTE — Telephone Encounter (Signed)
-----   Message from Renato Gails sent at 04/13/2019  3:17 PM EDT -----  Regarding: Dr. Cristal Ford  General Message/Vendor Calls    Caller's first and last name:      Reason for call: please fax last appt notes/results to pt fax [phone]. Pt has an appt with Dr. Sherryll Burger 04/14/2019 and they have not received notes yet.       Callback required yes/no and why: yes      Best contact number(s): [phone]      Details to clarify the request:      Renato Gails

## 2019-04-14 NOTE — Telephone Encounter (Signed)
-----   Message from Ellamae Sia sent at 04/13/2019  4:25 PM EDT -----  Regarding: Dr. Ernie Avena  Pt request a call back with her recent test results. Best contact number is [phone]

## 2019-04-21 NOTE — Telephone Encounter (Signed)
-----   Message from Lorre Nick sent at 04/21/2019 10:32 AM EDT -----  Regarding: Dr.Khawaja/Telephone  General Message/Vendor Calls    Caller's first and last name:Kevin Manson Passey      Reason for call:schedule an appt with Dr.Epps       Callback required yes/no and why:yes      Best contact number(s): [phone]      Details to clarify the request: Pt called requesting a call back in regards to if Dr.Khawaja has sent over labs to Dr.Epps and is a date set to schedule .      Lorre Nick

## 2019-04-26 NOTE — Telephone Encounter (Signed)
Called pt gave her information regarding TMS therapy per Dr. Mel Almond  [phone] and Dr. Tami Ribas [phone]

## 2019-05-03 ENCOUNTER — Telehealth (HOSPITAL_COMMUNITY): Payer: Self-pay | Admitting: Emergency Medicine

## 2019-05-03 MED FILL — XARELTO 20 MG TABLET: 20 | 30 days supply | Qty: 30 | Fill #5

## 2019-05-03 MED FILL — AMLODIPINE BESYLATE 5 MG TA: 5 | 30 days supply | Qty: 30 | Fill #4

## 2019-05-03 NOTE — Telephone Encounter (Signed)
Left message on voicemail with name and callback number Iver Fehrenbach RN Navigator Cardiac [facility] [phone] Office [phone] Cell  

## 2019-05-03 NOTE — Telephone Encounter (Signed)
error 

## 2019-05-04 ENCOUNTER — Other Ambulatory Visit: Payer: Self-pay

## 2019-05-04 ENCOUNTER — Encounter (HOSPITAL_COMMUNITY): Payer: Self-pay

## 2019-05-04 ENCOUNTER — Ambulatory Visit (HOSPITAL_COMMUNITY): Admission: RE | Admit: 2019-05-04 | Payer: Medicaid Other | Source: Ambulatory Visit

## 2019-05-04 ENCOUNTER — Ambulatory Visit (HOSPITAL_COMMUNITY)
Admission: RE | Admit: 2019-05-04 | Discharge: 2019-05-04 | Disposition: A | Discharge status: Home / Self Care | Payer: Medicaid Other | Source: Ambulatory Visit | Attending: Cardiology | Admitting: Cardiology

## 2019-05-04 DIAGNOSIS — R072 Precordial pain: Secondary | ICD-10-CM | POA: Diagnosis not present

## 2019-05-04 LAB — POCT I-STAT CREATININE: Creatinine, Ser: 1 mg/dL (ref 0.44–1.00)

## 2019-05-04 MED ORDER — IOHEXOL 350 MG/ML SOLN
95.0000 mL | Freq: Once | INTRAVENOUS | Status: AC | PRN
  Administered 2019-05-04: 95 mL via INTRAVENOUS

## 2019-05-04 MED ORDER — METOPROLOL TARTRATE 5 MG/5ML IV SOLN
10.0000 mg | INTRAVENOUS | Status: DC | PRN
  Administered 2019-05-04: 10 mg via INTRAVENOUS
  Filled 2019-05-04: qty 10

## 2019-05-04 MED ORDER — NITROGLYCERIN 0.4 MG SL SUBL
0.8000 mg | SUBLINGUAL_TABLET | Freq: Once | SUBLINGUAL | Status: AC
  Administered 2019-05-04: 0.8 mg via SUBLINGUAL
  Filled 2019-05-04: qty 25

## 2019-05-04 MED ORDER — METOPROLOL TARTRATE 5 MG/5ML IV SOLN
INTRAVENOUS | Status: AC
  Administered 2019-05-04: 13:00:00 10 mg via INTRAVENOUS
  Filled 2019-05-04: qty 10

## 2019-05-04 MED ORDER — METOPROLOL TARTRATE 5 MG/5ML IV SOLN
INTRAVENOUS | Status: AC
  Filled 2019-05-04: qty 10

## 2019-05-04 MED ORDER — NITROGLYCERIN 0.4 MG SL SUBL
SUBLINGUAL_TABLET | SUBLINGUAL | Status: AC
  Filled 2019-05-04: qty 2

## 2019-05-04 NOTE — Discharge Instructions (Signed)
Testing With IV Contrast Material °IV contrast material is a fluid that is used with some imaging tests. It is injected into your body through a vein. Contrast material is used when your health care providers need a detailed look at organs, tissues, or blood vessels that may not show up with the standard test. The material may be used when an X-ray, an MRI, a CT scan, or an ultrasound is done. °IV contrast material may be used for imaging tests that check: °· Muscles, skin, and fat. °· Breasts. °· Brain. °· Digestive tract. °· Heart. °· Organs such as the liver, kidneys, lungs, bladder, and many others. °· Arteries and veins. °Tell a health care provider about: °· Any allergies you have, especially an allergy to contrast material. °· All medicines you are taking, including metformin, beta blockers, NSAIDs (such as ibuprofen), interleukin-2, vitamins, herbs, eye drops, creams, and over-the-counter medicines. °· Any problems you or family members have had with the use of contrast material. °· Any blood disorders you have, such as sickle cell anemia. °· Any surgeries you have had. °· Any medical conditions you have or have had, especially alcohol abuse, dehydration, asthma, or kidney, liver, or heart problems. °· Whether you are pregnant or may be pregnant. °· Whether you are breastfeeding. Most contrast materials are safe for use in breastfeeding women. °What are the risks? °Generally, this is a safe procedure. However, problems may occur, including: °· Headache. °· Itching, skin rash, and hives. °· Nausea and vomiting. °· Allergic reactions. °· Wheezing or difficulty breathing. °· Abnormal heart rate. °· Changes in blood pressure. °· Throat swelling. °· Kidney damage. °What happens before the procedure? °Medicines °Ask your health care provider about: °· Changing or stopping your regular medicines. This is especially important if you are taking diabetes medicines or blood thinners. °· Taking medicines such as aspirin  and ibuprofen. These medicines can thin your blood. Do not take these medicines unless your health care provider tells you to take them. °· Taking over-the-counter medicines, vitamins, herbs, and supplements. °If you are at risk of having a reaction to the IV contrast material, you may be asked to take medicine before the procedure to prevent a reaction. °General instructions °· Follow instructions from your health care provider about eating or drinking restrictions. °· You may have an exam or lab tests to make sure that you can safely get IV contrast material. °· Ask if you will be given a medicine to help you relax (sedative) during the procedure. If so, plan to have someone take you home from the hospital or clinic. °What happens during the procedure? °· You may be given a sedative to help you relax. °· An IV will be inserted into one of your veins. °· Contrast material will be injected into your IV. °· You may feel warmth or flushing as the contrast material enters your bloodstream. °· You may have a metallic taste in your mouth for a few minutes. °· The needle may cause some discomfort and bruising. °· After the contrast material is in your body, the imaging test will be done. °The procedure may vary among health care providers and hospitals. °What can I expect after the procedure? °· The IV will be removed. °· You may be taken to a recovery area if sedation medicines were used. Your blood pressure, heart rate, breathing rate, and blood oxygen level will be monitored until you leave the hospital or clinic. °Follow these instructions at home: ° °· Take over-the-counter and   prescription medicines only as told by your health care provider. °? Your health care provider may tell you to not take certain medicines for a couple of days after the procedure. This is especially important if you are taking diabetes medicines. °· If you are told, drink enough fluid to keep your urine pale yellow. This will help to remove  the contrast material out of your body. °· Do not drive for 24 hours if you were given a sedative during your procedure. °· It is up to you to get the results of your procedure. Ask your health care provider, or the department that is doing the procedure, when your results will be ready. °· Keep all follow-up visits as told by your health care provider. This is important. °Contact a health care provider if: °· You have redness, swelling, or pain near your IV site. °Get help right away if: °· You have an abnormal heart rhythm. °· You have trouble breathing. °· You have: °? Chest pain. °? Pain in your back, neck, arm, jaw, or stomach. °? Nausea or sweating. °? Hives or a rash. °· You start shaking and cannot stop. °These symptoms may represent a serious problem that is an emergency. Do not wait to see if the symptoms will go away. Get medical help right away. Call your local emergency services (911 in the [place] Do not drive yourself to the hospital. °Summary °· IV contrast material may be used for imaging tests to help your health care providers see your organs and tissues more clearly. °· Tell your health care provider if you are pregnant or may be pregnant. °· During the procedure, you may feel warmth or flushing as the contrast material enters your bloodstream. °· After the procedure, drink enough fluid to keep your urine pale yellow. °This information is not intended to replace advice given to you by your health care provider. Make sure you discuss any questions you have with your health care provider. °Document Released: 10/08/2009 Document Revised: 01/06/2019 Document Reviewed: 01/06/2019 °Elsevier Patient Education © 2020 [employer] Inc[employer] ° ° °Cardiac CT Angiogram ° °A cardiac CT angiogram is a procedure to look at the heart and the area around the heart. It may be done to help find the cause of chest pains or other symptoms of heart disease. During this procedure, a large X-ray machine, called a CT scanner,  takes detailed pictures of the heart and the surrounding area after a dye (contrast material) has been injected into blood vessels in the area. The procedure is also sometimes called a coronary CT angiogram, coronary artery scanning, or CTA. °A cardiac CT angiogram allows the health care provider to see how well blood is flowing to and from the heart. The health care provider will be able to see if there are any problems, such as: °· Blockage or narrowing of the coronary arteries in the heart. °· Fluid around the heart. °· Signs of weakness or disease in the muscles, valves, and tissues of the heart. °Tell a health care provider about: °· Any allergies you have. This is especially important if you have had a previous allergic reaction to contrast dye. °· All medicines you are taking, including vitamins, herbs, eye drops, creams, and over-the-counter medicines. °· Any blood disorders you have. °· Any surgeries you have had. °· Any medical conditions you have. °· Whether you are pregnant or may be pregnant. °· Any anxiety disorders, chronic pain, or other conditions you have that may increase your stress or prevent   you from lying still. °What are the risks? °Generally, this is a safe procedure. However, problems may occur, including: °· Bleeding. °· Infection. °· Allergic reactions to medicines or dyes. °· Damage to other structures or organs. °· Kidney damage from the dye or contrast that is used. °· Increased risk of cancer from radiation exposure. This risk is low. Talk with your health care provider about: °? The risks and benefits of testing. °? How you can receive the lowest dose of radiation. °What happens before the procedure? °· Wear comfortable clothing and remove any jewelry, glasses, dentures, and hearing aids. °· Follow instructions from your health care provider about eating and drinking. This may include: °? For 12 hours before the test -- avoid caffeine. This includes tea, coffee, soda, energy drinks,  and diet pills. Drink plenty of water or other fluids that do not have caffeine in them. Being well-hydrated can prevent complications. °? For 4-6 hours before the test -- stop eating and drinking. The contrast dye can cause nausea, but this is less likely if your stomach is empty. °· Ask your health care provider about changing or stopping your regular medicines. This is especially important if you are taking diabetes medicines, blood thinners, or medicines to treat erectile dysfunction. °What happens during the procedure? °· Hair on your chest may need to be removed so that small sticky patches called electrodes can be placed on your chest. These will transmit information that helps to monitor your heart during the test. °· An IV tube will be inserted into one of your veins. °· You might be given a medicine to control your heart rate during the test. This will help to ensure that good images are obtained. °· You will be asked to lie on an exam table. This table will slide in and out of the CT machine during the procedure. °· Contrast dye will be injected into the IV tube. You might feel warm, or you may get a metallic taste in your mouth. °· You will be given a medicine (nitroglycerin) to relax (dilate) the arteries in your heart. °· The table that you are lying on will move into the CT machine tunnel for the scan. °· The person running the machine will give you instructions while the scans are being done. You may be asked to: °? Keep your arms above your head. °? Hold your breath. °? Stay very still, even if the table is moving. °· When the scanning is complete, you will be moved out of the machine. °· The IV tube will be removed. °The procedure may vary among health care providers and hospitals. °What happens after the procedure? °· You might feel warm, or you may get a metallic taste in your mouth from the contrast dye. °· You may have a headache from the nitroglycerin. °· After the procedure, drink water or  other fluids to wash (flush) the contrast material out of your body. °· Contact a health care provider if you have any symptoms of allergy to the contrast. These symptoms include: °? Shortness of breath. °? Rash or hives. °? A racing heartbeat. °· Most people can return to their normal activities right after the procedure. Ask your health care provider what activities are safe for you. °· It is up to you to get the results of your procedure. Ask your health care provider, or the department that is doing the procedure, when your results will be ready. °Summary °· A cardiac CT angiogram is a procedure to   look at the heart and the area around the heart. It may be done to help find the cause of chest pains or other symptoms of heart disease. °· During this procedure, a large X-ray machine, called a CT scanner, takes detailed pictures of the heart and the surrounding area after a dye (contrast material) has been injected into blood vessels in the area. °· Ask your health care provider about changing or stopping your regular medicines before the procedure. This is especially important if you are taking diabetes medicines, blood thinners, or medicines to treat erectile dysfunction. °· After the procedure, drink water or other fluids to wash (flush) the contrast material out of your body. °This information is not intended to replace advice given to you by your health care provider. Make sure you discuss any questions you have with your health care provider. °Document Released: 10/02/2008 Document Revised: 10/02/2017 Document Reviewed: 09/08/2016 °Elsevier Patient Education © 2020 Else[employer] Inc. ° °

## 2019-05-04 NOTE — Progress Notes (Signed)
Pt tolerated exam without incident.  PIV removed and dressing applied.  Pt provided with caffeinated beverage and crackers.  Discharge instructions discussed with patient.  Pt discharged

## 2019-05-09 ENCOUNTER — Telehealth: Payer: Self-pay

## 2019-05-09 NOTE — Telephone Encounter (Signed)
Called pt to change appointment to virtual or reschedule for in office at a later date. Left message to call back.

## 2019-05-10 ENCOUNTER — Telehealth: Payer: Self-pay | Admitting: Cardiology

## 2019-05-10 NOTE — Telephone Encounter (Signed)
Spoke to pt. She stated she can do a virtual visit. Asked that link be sent to her email for video visit. Email: [email or web address]  Consent will be sent to pt MyChart for her to review prior to appt with Dr. Harrell Gave tomorrow.  Pt verbalized understanding and thanks.

## 2019-05-10 NOTE — Telephone Encounter (Signed)
Attempted to contact pt x 2 to change appointment to virtual visit. Unable to leave message as mailbox is full.

## 2019-05-10 NOTE — Telephone Encounter (Signed)
LVM, reminding pt of her appt with Dr Harrell Gave on 05-11-19.

## 2019-05-11 ENCOUNTER — Telehealth (INDEPENDENT_AMBULATORY_CARE_PROVIDER_SITE_OTHER): Payer: Medicaid Other | Admitting: Cardiology

## 2019-05-11 ENCOUNTER — Encounter: Payer: Self-pay | Admitting: Cardiology

## 2019-05-11 VITALS — Wt 182.0 lb

## 2019-05-11 DIAGNOSIS — Z712 Person consulting for explanation of examination or test findings: Secondary | ICD-10-CM | POA: Diagnosis not present

## 2019-05-11 DIAGNOSIS — E785 Hyperlipidemia, unspecified: Secondary | ICD-10-CM

## 2019-05-11 DIAGNOSIS — I10 Essential (primary) hypertension: Secondary | ICD-10-CM | POA: Diagnosis not present

## 2019-05-11 DIAGNOSIS — R072 Precordial pain: Secondary | ICD-10-CM

## 2019-05-11 DIAGNOSIS — Z7189 Other specified counseling: Secondary | ICD-10-CM | POA: Diagnosis not present

## 2019-05-11 NOTE — Progress Notes (Signed)
Virtual Visit via Video Note   This visit type was conducted due to national recommendations for restrictions regarding the COVID-19 Pandemic (e.g. social distancing) in an effort to limit this patient's exposure and mitigate transmission in our community.  Due to her co-morbid illnesses, this patient is at least at moderate risk for complications without adequate follow up.  This format is felt to be most appropriate for this patient at this time.  All issues noted in this document were discussed and addressed.  A limited physical exam was performed with this format.  Please refer to the patient's chart for her consent to telehealth for [facility].   Date:  05/11/2019   ID:  Tammy [name], DOB October 07, 1956, MRN [id number]  Patient Location: Home Provider Location: Home  PCP:  [name], MD  Cardiologist:  Buford Dresser, MD  Electrophysiologist:  None   Evaluation Performed:  Follow-Up Visit  Chief Complaint:  followup  History of Present Illness:    Tammy [name] is a 63 y.o. female with PMH HTN, HLD, prior tobacco use. She was seen virtually for a new chest pain evaluation on 03/09/19.  Of note, video visit was attempted x3, and then due to technical difficulties was transitioned to telephone visit.  The patient does not have symptoms concerning for COVID-19 infection (fever, chills, cough, or new shortness of breath).   Today:  Reviewed results of CT coronary angio, very reassuring. Reviewed that chest pain is unlikely to be cardiac in nature given these findings. Discussed that long term plan will be prevention. She reports that she is no longer smoking. She plans to follow up with her PCP in the next few weeks, will have her BP checked and lipids drawn then.  Denies shortness of breath at rest or with normal exertion. No PND, orthopnea, LE edema or unexpected weight gain. No syncope or palpitations.   Past Medical History:  Diagnosis Date  . Chronic  headaches   . DVT (deep venous thrombosis) ([place])   . Leukemia ([place])    Told she had it at age 8, given some shots  . Pulmonary embolism ([place])   . Seasonal allergies    Past Surgical History:  Procedure Laterality Date  . CESAREAN SECTION     one previous  . right arm fracture    . TUBAL LIGATION       Current Meds  Medication Sig  . amLODipine (NORVASC) 5 MG tablet Take 1 tablet (5 mg total) by mouth daily.  Marland Kitchen atorvastatin (LIPITOR) 40 MG tablet Take 1 tablet (40 mg total) by mouth daily.  . clobetasol cream (TEMOVATE) 1.74 % Apply 1 application topically 2 (two) times daily.  Marland Kitchen gabapentin (NEURONTIN) 300 MG capsule Take 1 tablet in the morning, 1 tablet in the afternoon, and 2 tablet at bedtime (Patient taking differently: Take 1 tablet in the morning, 1 tablet in the afternoon, and 1 at bedtime)  . hydrOXYzine (ATARAX/VISTARIL) 25 MG tablet Take 1 tablet (25 mg total) by mouth 2 (two) times daily as needed.  . rivaroxaban (XARELTO) 10 MG TABS tablet Take 1 tablet (10 mg total) by mouth daily.   Current Facility-Administered Medications for the 05/11/19 encounter (Telemedicine) with Buford Dresser, MD  Medication  . lidocaine (XYLOCAINE) 5 % ointment  . methylPREDNISolone sodium succinate (SOLU-MEDROL) 1,000 mg in sodium chloride 0.9 % 50 mL IVPB     Allergies:   Patient has no known allergies.   Social History   Tobacco Use  .  Smoking status: Light Tobacco Smoker    Packs/day: 0.00    Years: 40.00    Pack years: 0.00    Types: Cigarettes  . Smokeless tobacco: Never Used  . Tobacco comment: Patient reports smoking 2-3 cigarettes a day   Substance Use Topics  . Alcohol use: No  . Drug use: No     Family Hx: The patient's family history includes Breast cancer in her maternal grandmother and mother; Cancer in her sister; Diabetes in her brother; Emphysema in her father; Heart disease in her sister.  Family history: sister with heart problems, died of cancer. Son  has a "stimulator" in his heart. Older brother has cardiac stents, younger brother has stents in his heart. Another brother had bypass surgery. Another sister had open heart surgery.  ROS:   Please see the history of present illness.    Constitutional: Negative for chills, fever, night sweats, unintentional weight loss  HENT: Negative for ear pain and hearing loss.   Eyes: Negative for loss of vision and eye pain.  Respiratory: Negative for cough, sputum, wheezing.   Cardiovascular: See HPI. Gastrointestinal: Negative for abdominal pain, melena, and hematochezia.  Genitourinary: Negative for dysuria and hematuria.  Musculoskeletal: Negative for falls and myalgias.  Skin: Negative for itching and rash.  Neurological: Negative for focal weakness, focal sensory changes and loss of consciousness.  Endo/Heme/Allergies: Does not bruise/bleed easily.  All other systems reviewed and are negative.   Prior CV studies:   The following studies were reviewed today:  CT coronary 05/04/19 Pulmonary veins drain normally to the left atrium. Calcium Score: 0 Agatston units. Coronary Arteries: Right dominant with no anomalies LM: No plaque or stenosis. LAD system:  No plaque or stenosis. Circumflex system: Moderate ramus. No plaque or stenosis in the LCx system. RCA system: No plaque or stenosis.  IMPRESSION: 1. Coronary artery calcium score 0 Agatston units, suggesting low risk for future cardiac events. 2.  No significant coronary disease was noted.  Labs/Other Tests and Data Reviewed:    EKG:  An ECG dated 02/14/19 was personally reviewed today and demonstrated:  sinus tachycardia at 113 bpm  Recent Labs: 06/29/2018: ALT 17; BUN 9; Hemoglobin 13.0; Platelets 302; Potassium 4.6; Sodium 142 05/04/2019: Creatinine, Ser 1.00   Recent Lipid Panel Lab Results  Component Value Date/Time   CHOL 221 (H) 05/13/2017 09:56 AM   TRIG 326 (H) 05/13/2017 09:56 AM   HDL 40 05/13/2017 09:56 AM    CHOLHDL 5.5 (H) 05/13/2017 09:56 AM   CHOLHDL 7.2 (H) 12/24/2015 11:09 AM   LDLCALC 116 (H) 05/13/2017 09:56 AM    Wt Readings from Last 3 Encounters:  05/11/19 182 lb (82.6 kg)  03/09/19 184 lb (83.5 kg)  02/14/19 184 lb 9.6 oz (83.7 kg)     Objective:    Vital Signs:  Wt 182 lb (82.6 kg)   BMI 33.29 kg/m    Speaking comfortably, in no acute distress  ASSESSMENT & PLAN:    Chest pain: right sided pain is atypical, but she has risk factors and a very strong family history -CT coronary with calcium score of 0, no plaque. Excellent long term prognosis based on this -recommended that she follow up with her PCP to discuss if non-cardiac workup needed versus watchful waiting  Hypertension: No BP reading available today. Has f/u soon with her PCP -previously reported in PCP notes as being well controlled -continue amlodipine 5 mg daily  Hyperlipidemia -had order for updated lipids, but not done. Plans  to do at upcoming PCP visit -written for atorvastatin 40 mg daily, has taken intermittently in the past. With 0 calcium score, has low risk for future events. Would use new lipids to update ASCVD risk score and discuss risk/benefit of statin use.  The 10-year ASCVD risk score Mikey Bussing DC Brooke Bonito., et al., 2013) is: 12.5%   Values used to calculate the score:     Age: 66 years     Sex: Female     Is Non-Hispanic African American: Yes     Diabetic: No     Tobacco smoker: Yes     Systolic Blood Pressure: 076 mmHg     Is BP treated: Yes     HDL Cholesterol: 40 mg/dL     Total Cholesterol: 221 mg/dL  History of DVT/PE: on rivaroxaban  COVID-19 Education: The signs and symptoms of COVID-19 were discussed with the patient and how to seek care for testing (follow up with PCP or arrange E-visit).  The importance of social distancing was discussed today.  Time:   Today, I have spent 16 minutes with the patient with telehealth technology discussing the above problems.     Medication  Adjustments/Labs and Tests Ordered: Current medicines are reviewed at length with the patient today.  Concerns regarding medicines are outlined above.   Patient Instructions  Medication Instructions:  Your Physician recommend you continue on your current medication as directed.    If you need a refill on your cardiac medications before your next appointment, please call your pharmacy.   Lab work: None  Testing/Procedures: None  Follow-Up: At [facility], you and your health needs are our priority.  As part of our continuing mission to provide you with exceptional heart care, we have created designated Provider Care Teams.  These Care Teams include your primary Cardiologist (physician) and Advanced Practice Providers (APPs -  Physician Assistants and Nurse Practitioners) who all work together to provide you with the care you need, when you need it. You will need a follow up appointment in 1 years.  Please call our office 2 months in advance to schedule this appointment.  You may see Buford Dresser, MD or one of the following Advanced Practice Providers on your designated Care Team:   Rosaria Ferries, PA-C . Jory Sims, DNP, ANP       Signed, Buford Dresser, MD  05/11/2019 10:48 AM    [place]

## 2019-05-11 NOTE — Patient Instructions (Signed)

## 2019-05-12 NOTE — Telephone Encounter (Signed)
-----   Message from Lorre Nick sent at 05/12/2019 11:06 AM EDT -----  Regarding: Dr.Khawaja/Telephone  General Message/Vendor Calls    Caller's first and last name:Cecia Manson Passey      Reason for call: copies of last testing fax to pt therapist       Callback required yes/no and why:yes      Best contact number(s):[phone]      Details to clarify the request: Pt called requesting a copy of last testing results fax to Dr.Joanne Roddie Mc at [phone] and Dr.Rehan Tami Ribas fax#[phone] ph#[phone] pt has a schedule appt tomorrow at 3 pm for a TMS test and would like the fax sent over today .      Lorre Nick

## 2019-05-13 ENCOUNTER — Ambulatory Visit: Payer: Medicaid Other | Admitting: Internal Medicine

## 2019-05-25 ENCOUNTER — Other Ambulatory Visit: Payer: Self-pay

## 2019-05-25 ENCOUNTER — Encounter: Payer: Self-pay | Admitting: Neurology

## 2019-05-25 ENCOUNTER — Telehealth (INDEPENDENT_AMBULATORY_CARE_PROVIDER_SITE_OTHER): Payer: Medicaid Other | Admitting: Neurology

## 2019-05-25 VITALS — Ht 62.0 in | Wt 185.0 lb

## 2019-05-25 DIAGNOSIS — G6181 Chronic inflammatory demyelinating polyneuritis: Secondary | ICD-10-CM

## 2019-05-25 MED ORDER — DULOXETINE HCL 30 MG PO CPEP: 30.0000 mg | ORAL_CAPSULE | Freq: Every day | ORAL | 3 refills | Status: DC

## 2019-05-25 MED FILL — DULoxetine HCL 30 MG CPEP: 30 | 30 days supply | Qty: 30 | Fill #0

## 2019-05-25 NOTE — Progress Notes (Signed)
No answer at 1045 

## 2019-05-25 NOTE — Progress Notes (Signed)
appt scheduled

## 2019-05-25 NOTE — Progress Notes (Signed)
   Virtual Visit via Video Note The purpose of this virtual visit is to provide medical care while limiting exposure to the novel coronavirus.    Consent was obtained for video visit:  Yes.   Answered questions that patient had about telehealth interaction:  Yes.   I discussed the limitations, risks, security and privacy concerns of performing an evaluation and management service by telemedicine. I also discussed with the patient that there may be a patient responsible charge related to this service. The patient expressed understanding and agreed to proceed.  Pt location: Home Physician Location: office Name of referring provider:  [name], MD I connected with Balinda Quails at patients initiation/request on 05/25/2019 at  9:50 AM EDT by video enabled telemedicine application and verified that I am speaking with the correct person using two identifiers. Pt MRN:  [id number] Pt DOB:  April 10, 1956 Video Participants:  Balinda Quails   History of Present Illness: This is a 63 y.o. 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  She has been tolerating IVIG every 3 weeks which is administered at home.  She continues to experience very slow and mild improvement in the hands, such that she is able to hold objects longer and is dropping items less frequently.  Unfortunately, she still has painful paresthesias at nighttime, constant numbness, and weakness of the hands.  She denies any discomfort of the legs or falls.   Observations/Objective:   Vitals:   05/25/19 0917  Weight: 185 lb (83.9 kg)  Height: 5\' 2"  (1.575 m)   Patient is awake, alert, and appears comfortable.  Oriented x 4.   Extraocular muscles are intact. No ptosis.  Face is symmetric.  Speech is not dysarthric. Tongue is midline. Antigravity in all extremities.  No pronator drift. Gait not tested   Assessment and Plan:  1. Chronic inflammatory  demyelinating polyradiculoneuropathy, diagnosed December 2018.  Symptoms manifesting with paresthesias in the upper extremities and supported by electrodiagnostic and CSF findings.  She was initially on Solu-Medrol and transitioned to IVIG in September 2019.  IVIG has improved severity of painful paresthesias allowing her to be on lower dose of gabapentin.  She continues to have numbness and weakness in the hands. Continue home infusion with IVIG 1 mg/kg every 21 days  Continue gabapentin 300 mg three times daily - unable to titrate due to cognitive side effects Start Cymbalta 30mg  daily for painful paresthesias  2.  Thiamine deficiency, continue thiamine 100 mg daily.  Follow Up Instructions:   I discussed the assessment and treatment plan with the patient. The patient was provided an opportunity to ask questions and all were answered. The patient agreed with the plan and demonstrated an understanding of the instructions.   The patient was advised to call back or seek an in-person evaluation if the symptoms worsen or if the condition fails to improve as anticipated.  Follow-up in 4 months   Alda Berthold, DO

## 2019-06-13 ENCOUNTER — Encounter

## 2019-06-17 ENCOUNTER — Inpatient Hospital Stay: Payer: BLUE CROSS/BLUE SHIELD | Attending: Pain Medicine | Primary: Family Medicine

## 2019-06-29 ENCOUNTER — Other Ambulatory Visit: Payer: Self-pay

## 2019-06-29 DIAGNOSIS — I825Y9 Chronic embolism and thrombosis of unspecified deep veins of unspecified proximal lower extremity: Secondary | ICD-10-CM

## 2019-06-30 ENCOUNTER — Encounter: Payer: Self-pay | Admitting: Hematology and Oncology

## 2019-06-30 ENCOUNTER — Inpatient Hospital Stay: Payer: Medicaid Other | Attending: Internal Medicine

## 2019-06-30 ENCOUNTER — Inpatient Hospital Stay: Payer: Medicaid Other | Admitting: Hematology and Oncology

## 2019-07-04 ENCOUNTER — Inpatient Hospital Stay: Admit: 2019-07-04 | Payer: BLUE CROSS/BLUE SHIELD | Attending: Pain Medicine | Primary: Family Medicine

## 2019-07-04 ENCOUNTER — Encounter

## 2019-07-04 DIAGNOSIS — M4712 Other spondylosis with myelopathy, cervical region: Secondary | ICD-10-CM

## 2019-07-18 ENCOUNTER — Other Ambulatory Visit: Payer: Self-pay | Admitting: Hematology and Oncology

## 2019-07-18 DIAGNOSIS — I2782 Chronic pulmonary embolism: Secondary | ICD-10-CM

## 2019-07-18 MED FILL — AMLODIPINE BESYLATE 5 MG TA: 5 | 30 days supply | Qty: 30 | Fill #5

## 2019-07-18 MED FILL — hydrOXYzine HCL 25 MG TABS: 25 | 15 days supply | Qty: 30 | Fill #2

## 2019-07-18 MED FILL — ATORVASTATIN 40 MG TABLET: 40 | 90 days supply | Qty: 90 | Fill #1

## 2019-07-18 MED FILL — DULoxetine HCL 30 MG CPEP: 30 | 30 days supply | Qty: 30 | Fill #0

## 2019-09-22 ENCOUNTER — Other Ambulatory Visit: Payer: Self-pay

## 2019-09-22 NOTE — Progress Notes (Signed)
Follow-up Visit   Date: 09/23/19    Tammy [name] MRN: [id number] DOB: 1956/10/20   Interim History: Tammy [name] is a 63 y.o. 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  The patient was accompanied to the clinic by self.   History of present illness: Starting around 2018, she began having numbness and tingling of the hands. She has some weakness of her grip, such as holding a phone or opening jars.  Symptoms are worse at night time and often wake her up from sleeping.  She denies any chronic neck pain.  She takes gabapentin 323m three times daily and nortriptyline 224m, which does not provide any relief.   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 Because of clinical suspicion for CIDP, she had induction dose of IVMP and has noticed some improvement in her burning sensation.  NCS/EMG of the legs was normal.  Labs also indicated thiamine deficiency and she was started on supplementation.  She was switched to home IVIG in September 2019 due to lack of benefit on steroids.  She has noticed mild improvement only.    UPDATE 09/22/2019:  She is here for follow-up visit.  She continues to get IVIG every 21 days and feels that it may help for 2 weeks and then symptoms get worse again. There has been no significant change in her hand weakness, but she feels that she is able to hold bottles and open jars better.  There has been no worsening of symptoms in the hands.  She continues to take gabapentin 3006mn the morning and 600m37m bedtime for pain, which helps.  She denies any leg numbness/tingling, weakness, or imbalance.     Medications:  Current Outpatient Medications on File Prior to Visit  Medication Sig Dispense  Refill  . amLODipine (NORVASC) 5 MG tablet Take 1 tablet (5 mg total) by mouth daily. 90 tablet 3  . atorvastatin (LIPITOR) 40 MG tablet Take 1 tablet (40 mg total) by mouth daily. 90 tablet 3  . clobetasol cream (TEMOVATE) 0.059.37pply 1 application topically 2 (two) times daily. 60 g 3  . DULoxetine (CYMBALTA) 30 MG capsule Take 1 capsule (30 mg total) by mouth daily. 30 capsule 3  . hydrOXYzine (ATARAX/VISTARIL) 25 MG tablet Take 1 tablet (25 mg total) by mouth 2 (two) times daily as needed. 30 tablet 6  . rivaroxaban (XARELTO) 10 MG TABS tablet Take 1 tablet (10 mg total) by mouth daily. 90 tablet 11   Current Facility-Administered Medications on File Prior to Visit  Medication Dose Route Frequency Provider Last Rate Last Dose  . methylPREDNISolone sodium succinate (SOLU-MEDROL) 1,000 mg in sodium chloride 0.9 % 50 mL IVPB  1,000 mg Intravenous Q28 days PateAlda Berthold 58 mL/hr at 01/27/18 0928 1,000 mg at 01/27/18 [id number]: No Known Allergies  Review of Systems:  CONSTITUTIONAL: No fevers, chills, night sweats, or weight loss.  EYES: No visual changes or eye pain ENT: No hearing changes.  No history of nose bleeds.   RESPIRATORY: No cough, wheezing and shortness of breath.   CARDIOVASCULAR: Negative for chest pain, and palpitations.   GI: Negative for abdominal discomfort, blood in stools or black stools.  No recent change in bowel habits.  GU:  No history of incontinence.   MUSCLOSKELETAL: +history of joint pain or swelling.  No myalgias.   SKIN: Negative for lesions, rash, and itching.   ENDOCRINE: Negative for cold or heat intolerance, polydipsia or goiter.   PSYCH:  No depression or anxiety symptoms.   NEURO: As Above.   Vital Signs:  BP 130/70   Pulse (!) 111   Ht _0  (1.575 m)   Wt 190 lb (86.2 kg)   SpO2 96%   BMI 34.75 kg/m   Neurological Exam: MENTAL STATUS including orientation to time, place, person, recent and remote memory, attention span and  concentration, language, and fund of knowledge is normal.  Speech is not dysarthric.  CRANIAL NERVES:  Pupils equal round and reactive to light.  Normal conjugate, extra-ocular eye movements in all directions of gaze.  No ptosis.   Face is symmetric.   MOTOR:  Motor strength is 5/5 in all extremities, except intrinsic hand muscles 5-/5.  No atrophy, fasciculations or abnormal movements.  No pronator drift.  Tone is normal.    MSRs:  Reflexes are 2+/4 throughout  SENSORY:  Reduced sensation to temperature over the palmer surface bilaterally. Vibration is intact throughout  COORDINATION/GAIT:   Gait narrow based and stable.    Data: NCS/EMG of the arms 10/15/2017:  The electrophysiologic findings are most consistent with a subacute sensorimotor polyneuropathy, axon loss and demyelinating in type, affecting the upper extremities; these findings are moderate in degree electrically.  NCS/EMG of the legs 10/29/2017:  Normal  CSF 11/10/2017:  R1 W1 G75 P85*    IgG 0.64, MBP < 2, OCB + CSF and serum, cytology negative, ACE 4  Labs 10/29/2017:  ESR 72*, CRP <0.3, vitamin B12 434, vitamin B1 7*, copper 153, folate 15.6, MMA 122, ANA 1:80 (speckled), SPEP with IFE no M protein, ACE 77*, heavy metal screen  Labs 01/19/2019:  Vitamin B1 8  MRI cervical spine 01/20/2018: 1. Stable mild right foraminal narrowing at C6-7. 2. Slight progression of uncovertebral disease and mild right foraminal narrowing at C5-6. 3. No focal cord signal abnormality or thickening of nerve roots.   IMPRESSION/PLAN: 1. Chronic inflammatory demyelinating polyradiculoneuropathy (December 2018) affecting the upper extremities supported by EDX and CSF findings.  In January 2019, she was given induction course of IV methylprednisolone 63m x 5 days due to insurance changes, did not get IVMP again until the summer.   In September 2019, she was started in IVIG and has been on monthly maintenance therapy which has improved painful  paresthesias, but she does not feel lasting benefit. At this juncture, I recommend repeat NCS/EMG of the arms to determine if she needs ongoing IVIG For now, continue IVIG 1kg/kg every 21 days Continue gabapentin 3058min the morning and 60019mt bedtime - refills provided  2.  Thiamine deficiency, continue thiamine 100 mg daily.  Return to clinic in 4 months  Greater than 50% of this 25 minute visit was spent in counseling, explanation of diagnosis, planning of further management, and coordination of care.   Thank you for allowing me to participate in patient's care.  If I can answer any additional questions, I would be pleased to do so.    Sincerely,    Donika K. PatPosey ProntoO

## 2019-09-23 ENCOUNTER — Encounter: Payer: Self-pay | Admitting: Neurology

## 2019-09-23 ENCOUNTER — Ambulatory Visit (INDEPENDENT_AMBULATORY_CARE_PROVIDER_SITE_OTHER): Payer: Medicaid Other | Admitting: Neurology

## 2019-09-23 VITALS — BP 130/70 | HR 111 | Ht 62.0 in | Wt 190.0 lb

## 2019-09-23 DIAGNOSIS — G6181 Chronic inflammatory demyelinating polyneuritis: Secondary | ICD-10-CM | POA: Diagnosis not present

## 2019-09-23 MED ORDER — GABAPENTIN 300 MG PO CAPS: ORAL_CAPSULE | ORAL | 3 refills | Status: DC

## 2019-09-23 MED FILL — GABAPENTIN 300 MG CAPSULE: 300 | 270 days supply | Qty: 270 | Fill #0

## 2019-09-23 NOTE — Patient Instructions (Signed)
Nerve testing of the arms.  Refills were sent for gabapentin.  Continue IVIG as you are taking.  Depending on the results of your nerve testing, we will decide if there needs to be any changes on IVIG.  Follow-up in 4 months  ELECTROMYOGRAM AND NERVE CONDUCTION STUDIES (EMG/NCS) INSTRUCTIONS  How to Prepare The neurologist conducting the EMG will need to know if you have certain medical conditions. Tell the neurologist and other EMG lab personnel if you: . Have a pacemaker or any other electrical medical device . Take blood-thinning medications . Have hemophilia, a blood-clotting disorder that causes prolonged bleeding Bathing Take a shower or bath shortly before your exam in order to remove oils from your skin. Don't apply lotions or creams before the exam.  What to Expect You'll likely be asked to change into a hospital gown for the procedure and lie down on an examination table. The following explanations can help you understand what will happen during the exam.  . Electrodes. The neurologist or a technician places surface electrodes at various locations on your skin depending on where you're experiencing symptoms. Or the neurologist may insert needle electrodes at different sites depending on your symptoms.  . Sensations. The electrodes will at times transmit a tiny electrical current that you may feel as a twinge or spasm. The needle electrode may cause discomfort or pain that usually ends shortly after the needle is removed. If you are concerned about discomfort or pain, you may want to talk to the neurologist about taking a short break during the exam.  . Instructions. During the needle EMG, the neurologist will assess whether there is any spontaneous electrical activity when the muscle is at rest - activity that isn't present in healthy muscle tissue - and the degree of activity when you slightly contract the muscle.  He or she will give you instructions on resting and contracting a  muscle at appropriate times. Depending on what muscles and nerves the neurologist is examining, he or she may ask you to change positions during the exam.  After your EMG You may experience some temporary, minor bruising where the needle electrode was inserted into your muscle. This bruising should fade within several days. If it persists, contact your primary care doctor.

## 2019-09-26 ENCOUNTER — Ambulatory Visit: Payer: Medicaid Other | Admitting: Neurology

## 2019-10-03 ENCOUNTER — Telehealth: Payer: Self-pay | Admitting: Hematology and Oncology

## 2019-10-03 NOTE — Telephone Encounter (Signed)
Returned patient's phone call regarding rescheduling an appointment, no voicemail set up.

## 2019-10-05 ENCOUNTER — Telehealth: Payer: Self-pay | Admitting: Hematology and Oncology

## 2019-10-05 NOTE — Telephone Encounter (Signed)
Returned patient's phone call regarding rescheduling missed August appointment, per patient's request appointment has moved to 12/03.

## 2019-10-06 ENCOUNTER — Other Ambulatory Visit: Payer: Self-pay

## 2019-10-06 ENCOUNTER — Inpatient Hospital Stay (HOSPITAL_BASED_OUTPATIENT_CLINIC_OR_DEPARTMENT_OTHER): Payer: Medicaid Other | Admitting: Hematology and Oncology

## 2019-10-06 ENCOUNTER — Inpatient Hospital Stay: Payer: Medicaid Other | Attending: Hematology and Oncology

## 2019-10-06 ENCOUNTER — Encounter: Payer: Self-pay | Admitting: Hematology and Oncology

## 2019-10-06 DIAGNOSIS — I825Y9 Chronic embolism and thrombosis of unspecified deep veins of unspecified proximal lower extremity: Secondary | ICD-10-CM

## 2019-10-06 DIAGNOSIS — N183 Chronic kidney disease, stage 3 unspecified: Secondary | ICD-10-CM | POA: Diagnosis not present

## 2019-10-06 DIAGNOSIS — Z86718 Personal history of other venous thrombosis and embolism: Secondary | ICD-10-CM | POA: Insufficient documentation

## 2019-10-06 DIAGNOSIS — Z7901 Long term (current) use of anticoagulants: Secondary | ICD-10-CM | POA: Diagnosis not present

## 2019-10-06 DIAGNOSIS — G6181 Chronic inflammatory demyelinating polyneuritis: Secondary | ICD-10-CM

## 2019-10-06 LAB — CBC WITH DIFFERENTIAL (CANCER CENTER ONLY)
Abs Immature Granulocytes: 0.03 10*3/uL (ref 0.00–0.07)
Basophils Absolute: 0.1 10*3/uL (ref 0.0–0.1)
Basophils Relative: 1 %
Eosinophils Absolute: 0.1 10*3/uL (ref 0.0–0.5)
Eosinophils Relative: 1 %
HCT: 38 % (ref 36.0–46.0)
Hemoglobin: 12.2 g/dL (ref 12.0–15.0)
Immature Granulocytes: 0 %
Lymphocytes Relative: 29 %
Lymphs Abs: 2.3 10*3/uL (ref 0.7–4.0)
MCH: 29.8 pg (ref 26.0–34.0)
MCHC: 32.1 g/dL (ref 30.0–36.0)
MCV: 92.7 fL (ref 80.0–100.0)
Monocytes Absolute: 0.6 10*3/uL (ref 0.1–1.0)
Monocytes Relative: 8 %
Neutro Abs: 4.8 10*3/uL (ref 1.7–7.7)
Neutrophils Relative %: 61 %
Platelet Count: 344 10*3/uL (ref 150–400)
RBC: 4.1 MIL/uL (ref 3.87–5.11)
RDW: 14.7 % (ref 11.5–15.5)
WBC Count: 7.9 10*3/uL (ref 4.0–10.5)
nRBC: 0 % (ref 0.0–0.2)

## 2019-10-06 LAB — CMP (CANCER CENTER ONLY)
ALT: 14 U/L (ref 0–44)
AST: 17 U/L (ref 15–41)
Albumin: 3.4 g/dL — ABNORMAL LOW (ref 3.5–5.0)
Alkaline Phosphatase: 138 U/L — ABNORMAL HIGH (ref 38–126)
Anion gap: 9 (ref 5–15)
BUN: 14 mg/dL (ref 8–23)
CO2: 25 mmol/L (ref 22–32)
Calcium: 9.7 mg/dL (ref 8.9–10.3)
Chloride: 107 mmol/L (ref 98–111)
Creatinine: 1.27 mg/dL — ABNORMAL HIGH (ref 0.44–1.00)
GFR, Est AFR Am: 53 mL/min — ABNORMAL LOW (ref >60)
GFR, Estimated: 46 mL/min — ABNORMAL LOW (ref >60)
Glucose, Bld: 145 mg/dL — ABNORMAL HIGH (ref 70–99)
Potassium: 4.2 mmol/L (ref 3.5–5.1)
Sodium: 141 mmol/L (ref 135–145)
Total Bilirubin: 0.7 mg/dL (ref 0.3–1.2)
Total Protein: 8.5 g/dL — ABNORMAL HIGH (ref 6.5–8.1)

## 2019-10-06 MED ORDER — RIVAROXABAN 20 MG PO TABS: 20.0000 mg | ORAL_TABLET | Freq: Every day | ORAL | 11 refills | Status: DC

## 2019-10-06 MED FILL — XARELTO 20 MG TABLET: 20 | 30 days supply | Qty: 30 | Fill #0

## 2019-10-06 NOTE — Assessment & Plan Note (Signed)
She has been referred to see neurologist and is undergoing evaluation and treatment I would defer to them for further follow-up

## 2019-10-06 NOTE — Assessment & Plan Note (Signed)
She is doing with very well since her treatment was switched to Xarelto. She has no complications from treatment. Goal of treatment is lifelong. I will see her once a year She is educated to watch for signs and symptoms of bleeding 

## 2019-10-06 NOTE — Assessment & Plan Note (Signed)
She has intermittent elevated serum creatinine Her creatinine clearance is adequate for her to remain on Xarelto I will continue to follow her once a year

## 2019-10-06 NOTE — Progress Notes (Signed)
[place] OFFICE PROGRESS NOTE  [name], MD  ASSESSMENT & PLAN:  DVT (deep venous thrombosis) She is doing with very well since her treatment was switched to Xarelto. She has no complications from treatment. Goal of treatment is lifelong. I will see her once a year She is educated to watch for signs and symptoms of bleeding  CIDP (chronic inflammatory demyelinating polyneuropathy) ([place]) She has been referred to see neurologist and is undergoing evaluation and treatment I would defer to them for further follow-up  CKD (chronic kidney disease) stage 3, GFR 30-59 ml/min She has intermittent elevated serum creatinine Her creatinine clearance is adequate for her to remain on Xarelto I will continue to follow her once a year   Orders Placed This Encounter  Procedures  . Comprehensive metabolic panel    Standing Status:   Future    Standing Expiration Date:   11/09/2020  . CBC with Differential    Standing Status:   Future    Standing Expiration Date:   11/09/2020    INTERVAL HISTORY: Tammy [name] 63 y.o. female returns for further follow-up She is doing well on Xarelto Denies recurrence/diagnosis of recent blood clot The patient denies any recent signs or symptoms of bleeding such as spontaneous epistaxis, hematuria or hematochezia. She continues to follow a neurologist for CIDP No recent chest pain or shortness of breath  SUMMARY OF HEMATOLOGIC HISTORY:  She is being referred because of recurrent DVT. According to the patient, she was first diagnosed with blood clot in 2008. According to the patient it was unprovoked. The patient complained of shortness of breath and cough and chest discomfort. She was smoking at that time. She had imaging study of the lung done which show evidence of PE. According to the patient she was on a blood thinner for 2 years. The date of the CT scan was 06/24/2007. In 2014, she started to complain of intermittent sharp  discomfort on the right leg associated with some mild swelling. She states that she had pain on the left leg as well but is not as severe compared to the right leg. She went to the emergency department. Results of the ultrasound venous Doppler came back consistent with acute deep vein thrombosis involving the posterior tibial and perioneal vein of the right lower extremity. D-dimer was elevated. She was placed on Lovenox and transition to warfarin 5 mg daily. Her INR has been supratherapeutic requiring vitamin K and multiple dose interruption to keep her INR at the desire range at 2-3. She denies any bleeding complications such as spontaneous epistaxis, hematuria, hematochezia, or the need for blood transfusions. She had mild intermittent chest discomfort throughout this and had imaging study done which show possible new clot in the left lung on the CT scan from September 2014. She has been pregnant 4 times including C-section for her pregnancy and never developed any peripartum blood clots. There were no family history of blood clots. In October 2014, I made a decision to change her to Xarelto.  Duration of treatment is lifelong  I have reviewed the past medical history, past surgical history, social history and family history with the patient and they are unchanged from previous note.  ALLERGIES:  has No Known Allergies.  MEDICATIONS:  Current Outpatient Medications  Medication Sig Dispense Refill  . amLODipine (NORVASC) 5 MG tablet Take 1 tablet (5 mg total) by mouth daily. 90 tablet 3  . atorvastatin (LIPITOR) 40 MG tablet Take 1 tablet (40  mg total) by mouth daily. 90 tablet 3  . clobetasol cream (TEMOVATE) [id number] % Apply 1 application topically 2 (two) times daily. 60 g 3  . DULoxetine (CYMBALTA) 30 MG capsule Take 1 capsule (30 mg total) by mouth daily. 30 capsule 3  . gabapentin (NEURONTIN) 300 MG capsule Take 1 tablet in the morning and 2 tablet at bedtime. 270 capsule 3  . hydrOXYzine  (ATARAX/VISTARIL) 25 MG tablet Take 1 tablet (25 mg total) by mouth 2 (two) times daily as needed. 30 tablet 6  . rivaroxaban (XARELTO) 20 MG TABS tablet Take 1 tablet (20 mg total) by mouth daily. 30 tablet 11   Current Facility-Administered Medications  Medication Dose Route Frequency Provider Last Rate Last Dose  . methylPREDNISolone sodium succinate (SOLU-MEDROL) 1,000 mg in sodium chloride 0.9 % 50 mL IVPB  1,000 mg Intravenous Q28 days Alda Berthold, DO 58 mL/hr at 01/27/18 0928 1,000 mg at 01/27/18 [id number]     REVIEW OF SYSTEMS:   Constitutional: Denies fevers, chills or night sweats Eyes: Denies blurriness of vision Ears, nose, mouth, throat, and face: Denies mucositis or sore throat Respiratory: Denies cough, dyspnea or wheezes Cardiovascular: Denies palpitation, chest discomfort or lower extremity swelling Gastrointestinal:  Denies nausea, heartburn or change in bowel habits Skin: Denies abnormal skin rashes Lymphatics: Denies new lymphadenopathy or easy bruising Neurological:Denies numbness, tingling or new weaknesses Behavioral/Psych: Mood is stable, no new changes  All other systems were reviewed with the patient and are negative.  PHYSICAL EXAMINATION: ECOG PERFORMANCE STATUS: 1 - Symptomatic but completely ambulatory  Vitals:   10/06/19 1019  BP: 133/81  Pulse: 98  Resp: 18  Temp: 97.8 F (36.6 C)  SpO2: 100%   Filed Weights   10/06/19 1019  Weight: 191 lb 14.4 oz (87 kg)    GENERAL:alert, no distress and comfortable SKIN: skin color, texture, turgor are normal, no rashes or significant lesions EYES: normal, Conjunctiva are pink and non-injected, sclera clear OROPHARYNX:no exudate, no erythema and lips, buccal mucosa, and tongue normal  NECK: supple, thyroid normal size, non-tender, without nodularity LYMPH:  no palpable lymphadenopathy in the cervical, axillary or inguinal LUNGS: clear to auscultation and percussion with normal breathing effort HEART:  regular rate & rhythm and no murmurs and no lower extremity edema ABDOMEN:abdomen soft, non-tender and normal bowel sounds Musculoskeletal:no cyanosis of digits and no clubbing  NEURO: alert & oriented x 3 with fluent speech, no focal motor/sensory deficits  LABORATORY DATA:  I have reviewed the data as listed     Component Value Date/Time   NA 141 10/06/2019 0932   NA 140 05/13/2017 0956   NA 141 08/11/2013 1046   K 4.2 10/06/2019 0932   K 4.3 08/11/2013 1046   CL 107 10/06/2019 0932   CO2 25 10/06/2019 0932   CO2 27 08/11/2013 1046   GLUCOSE 145 (H) 10/06/2019 0932   GLUCOSE 102 08/11/2013 1046   BUN 14 10/06/2019 0932   BUN 11 05/13/2017 0956   BUN 10.5 08/11/2013 1046   CREATININE 1.27 (H) 10/06/2019 0932   CREATININE 0.99 10/22/2015 1159   CREATININE 1.0 08/11/2013 1046   CALCIUM 9.7 10/06/2019 0932   CALCIUM 9.5 08/11/2013 1046   PROT 8.5 (H) 10/06/2019 0932   PROT 7.3 05/13/2017 0956   PROT 7.3 08/11/2013 1046   ALBUMIN 3.4 (L) 10/06/2019 0932   ALBUMIN 4.1 11/10/2017 1444   ALBUMIN 3.4 (L) 08/11/2013 1046   AST 17 10/06/2019 0932   AST 15 08/11/2013 1046  ALT 14 10/06/2019 0932   ALT 13 08/11/2013 1046   ALKPHOS 138 (H) 10/06/2019 0932   ALKPHOS 112 08/11/2013 1046   BILITOT 0.7 10/06/2019 0932   BILITOT 0.73 08/11/2013 1046   GFRNONAA 46 (L) 10/06/2019 0932   GFRNONAA 63 10/22/2015 1159   GFRAA 53 (L) 10/06/2019 0932   GFRAA 73 10/22/2015 1159    No results found for: SPEP, UPEP  Lab Results  Component Value Date   WBC 7.9 10/06/2019   NEUTROABS 4.8 10/06/2019   HGB 12.2 10/06/2019   HCT 38.0 10/06/2019   MCV 92.7 10/06/2019   PLT 344 10/06/2019      Chemistry      Component Value Date/Time   NA 141 10/06/2019 0932   NA 140 05/13/2017 0956   NA 141 08/11/2013 1046   K 4.2 10/06/2019 0932   K 4.3 08/11/2013 1046   CL 107 10/06/2019 0932   CO2 25 10/06/2019 0932   CO2 27 08/11/2013 1046   BUN 14 10/06/2019 0932   BUN 11 05/13/2017 0956    BUN 10.5 08/11/2013 1046   CREATININE 1.27 (H) 10/06/2019 0932   CREATININE 0.99 10/22/2015 1159   CREATININE 1.0 08/11/2013 1046      Component Value Date/Time   CALCIUM 9.7 10/06/2019 0932   CALCIUM 9.5 08/11/2013 1046   ALKPHOS 138 (H) 10/06/2019 0932   ALKPHOS 112 08/11/2013 1046   AST 17 10/06/2019 0932   AST 15 08/11/2013 1046   ALT 14 10/06/2019 0932   ALT 13 08/11/2013 1046   BILITOT 0.7 10/06/2019 0932   BILITOT 0.73 08/11/2013 1046      I spent 15 minutes counseling the patient face to face. The total time spent in the appointment was 20 minutes and more than 50% was on counseling.   All questions were answered. The patient knows to call the clinic with any problems, questions or concerns. No barriers to learning was detected.    [name], MD 12/3/202011:38 AM

## 2019-10-10 ENCOUNTER — Telehealth: Payer: Self-pay | Admitting: Hematology and Oncology

## 2019-10-10 NOTE — Telephone Encounter (Signed)
Scheduled appt per 12/3 sch message- mailed reminder with appt date and time

## 2019-10-12 ENCOUNTER — Encounter

## 2019-10-12 ENCOUNTER — Inpatient Hospital Stay: Admit: 2019-10-12 | Payer: BLUE CROSS/BLUE SHIELD | Attending: Pain Medicine | Primary: Family Medicine

## 2019-10-12 DIAGNOSIS — M170 Bilateral primary osteoarthritis of knee: Secondary | ICD-10-CM

## 2019-10-17 ENCOUNTER — Other Ambulatory Visit: Payer: Self-pay

## 2019-10-18 ENCOUNTER — Other Ambulatory Visit: Payer: Self-pay

## 2019-10-18 ENCOUNTER — Ambulatory Visit (INDEPENDENT_AMBULATORY_CARE_PROVIDER_SITE_OTHER): Payer: Medicaid Other | Admitting: Neurology

## 2019-10-18 DIAGNOSIS — G6181 Chronic inflammatory demyelinating polyneuritis: Secondary | ICD-10-CM | POA: Diagnosis not present

## 2019-10-18 NOTE — Procedures (Signed)
[facility]  [place] [address], [address]  [place], [place] [address] Tel: [phone] Fax:  [phone] Test Date:  10/18/2019  Patient: Tammy [name] DOB: [birth year] Physician: Narda Amber, DO  Sex: Female Height: 5\' 2"  Ref Phys: Narda Amber, DO  ID#: [id number] Temp: 35.0 Technician:    Patient Complaints: This is a 63 year old female with CIDP who referred for evaluation of progressive bilateral hand paresthesias.  NCV & EMG Findings: Extensive electrodiagnostic testing of the right upper extremity and additional studies of the left shows:  1. Bilateral median and ulnar sensory responses are absent, which has progressed from prior study on 10/15/2017.  Bilateral radial sensory responses are within normal limits. 2. Bilateral median motor responses show severely prolonged latencies (R8.0, L10.4 ms) and reduced amplitude (R4.9, L3.1 mV).   Bilateral ulnar motor responses show slowed conduction velocity diffusely, temporal dispersion, and reduced motor amplitude at the abductor digiti minimi (ADM) and first dorsal interosseous muscles, with the exception of the left ulnar motor response at the ADM, which is normal. 3. Chronic motor axonal loss changes are seen affecting the distal hand muscles bilaterally, which is worse on the right where there is evidence of active denervation in the first dorsal interosseous and extensor indicis proprius muscles.  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   ___________________________ Narda Amber, DO    Nerve Conduction Studies Anti Sensory Summary Table   Site NR Peak (ms) Norm Peak (ms) P-T Amp (V) Norm P-T Amp  Left Median Anti Sensory (2nd Digit)  35C  Wrist NR  <3.8  >10  Right Median Anti Sensory (2nd Digit)  35C  Wrist NR  <3.8  >10  Left Radial Anti Sensory (Base 1st  Digit)  35C  Wrist    2.7 <2.8 25.4 >10  Right Radial Anti Sensory (Base 1st Digit)  35C  Wrist    2.2 <2.8 37.8 >10  Left Ulnar Anti Sensory (5th Digit)  35C  Wrist NR  <3.2  >5  Right Ulnar Anti Sensory (5th Digit)  35C  Wrist NR  <3.2  >5   Motor Summary Table   Site NR Onset (ms) Norm Onset (ms) O-P Amp (mV) Norm O-P Amp Site1 Site2 Delta-0 (ms) Dist (cm) Vel (m/s) Norm Vel (m/s)  Left Median Motor (Abd Poll Brev)  35C  Wrist    10.4 <4.0 3.1 >5 Elbow Wrist 5.5 28.0 51 >50  Elbow    15.9  2.5         Right Median Motor (Abd Poll Brev)  35C  Wrist    8.0 <4.0 4.9 >5 Elbow Wrist 4.2 28.0 67 >50  Elbow    12.2  4.8         Left Ulnar Motor (Abd Dig Minimi)  35C  Wrist    2.5 <3.1 7.2 >7 B Elbow Wrist 4.6 23.0 50 >50  B Elbow    7.1  5.4  A Elbow B Elbow 3.4 10.0 29 >50  A Elbow    10.5  4.8         Right Ulnar Motor (Abd Dig Minimi)  35C  Wrist    3.5 <3.1 1.5 >7 B Elbow Wrist 6.8 23.0 34 >50  B Elbow    10.3  1.1  A Elbow B Elbow 3.3 10.0 30 >50  A Elbow    13.6  1.1  Left Ulnar (FDI) Motor (1st DI)  35C  Wrist    3.8 <4.5 3.2 >7 B Elbow Wrist 4.4 23.0 52 >50  B Elbow    8.2  2.8  A Elbow B Elbow 3.1 10.0 32 >50  A Elbow    11.3  2.8         Right Ulnar (FDI) Motor (1st DI)  35C  Wrist    4.1 <4.5 0.8 >7 B Elbow Wrist 5.8 23.0 40 >50  B Elbow    9.9  0.8  A Elbow B Elbow 2.4 10.0 42 >50  A Elbow    12.3  0.8          EMG   Side Muscle Ins Act Fibs Psw Fasc Number Recrt Dur Dur. Amp Amp. Poly Poly. Comment  Right 1stDorInt Nml 1+ Nml Nml 2- Rapid Many 1+ Many 1+ Some 1+ N/A  Right Ext Indicis Nml 1+ Nml Nml 2- Rapid Some 1+ Some 1+ Some 1+ N/A  Right Abd Poll Brev Nml Nml Nml Nml 2- Rapid Some 1+ Some 1+ Nml Nml N/A  Right PronatorTeres Nml Nml Nml Nml Nml Nml Nml Nml Nml Nml Nml Nml N/A  Right Biceps Nml Nml Nml Nml Nml Nml Nml Nml Nml Nml Nml Nml N/A  Right Triceps Nml Nml Nml Nml Nml Nml Nml Nml Nml Nml Nml Nml N/A  Right Deltoid Nml Nml Nml Nml Nml  Nml Nml Nml Nml Nml Nml Nml N/A  Right ABD Dig Min Nml Nml Nml Nml SMU Rapid All 1+ All 1+ All 1+ ATR  Right FlexCarpiUln Nml Nml Nml Nml 2- Rapid Some 1+ Some 1+ Some 1+ N/A  Left 1stDorInt Nml Nml Nml Nml 3- Rapid Most 1+ Most 1+ Most 1+ N/A  Left ABD Dig Min Nml Nml Nml Nml 1- Rapid Some 1+ Some 1+ Nml Nml N/A  Left Ext Indicis Nml Nml Nml Nml 1- Rapid Some 1+ Some 1+ Nml Nml N/A  Left PronatorTeres Nml Nml Nml Nml Nml Nml Nml Nml Nml Nml Nml Nml N/A  Left Abd Poll Brev Nml Nml Nml Nml 2- Rapid Many 1+ Many 1+ Some 1+ N/A  Left Biceps Nml Nml Nml Nml Nml Nml Nml Nml Nml Nml Nml Nml N/A  Left Triceps Nml Nml Nml Nml Nml Nml Nml Nml Nml Nml Nml Nml N/A  Left Deltoid Nml Nml Nml Nml Nml Nml Nml Nml Nml Nml Nml Nml N/A     Waveforms:

## 2019-10-19 ENCOUNTER — Ambulatory Visit: Payer: Medicaid Other

## 2019-11-09 MED FILL — XARELTO 20 MG TABLET: 20 | 30 days supply | Qty: 30 | Fill #0

## 2019-11-09 MED FILL — GABAPENTIN 300 MG CAPSULE: 300 | 30 days supply | Qty: 90 | Fill #0

## 2019-11-15 ENCOUNTER — Telehealth: Payer: Self-pay | Admitting: Neurology

## 2019-11-15 NOTE — Telephone Encounter (Signed)
[facility] Comprehensive neuropathy panel 12/05/9796 variance of uncertain significance identified. DST heterozygous uncertain significance NTRK1 heterozygous uncertain significance  Results discussed with patient which does not show any diagnostic hereditary neuropathy.

## 2019-11-28 ENCOUNTER — Emergency Department: Admit: 2019-11-29 | Payer: BLUE CROSS/BLUE SHIELD | Primary: Family Medicine

## 2019-11-28 DIAGNOSIS — R42 Dizziness and giddiness: Principal | ICD-10-CM

## 2019-11-28 NOTE — Consults (Signed)
[name]    Name:  Vanessa [name], Vanessa [name]  MR#:  [id number]  DOB:  02-Jul-1956  ACCOUNT #:  [id number]  DATE OF SERVICE:  11/29/2019    NEUROLOGY CONSULTATION    HISTORY OF PRESENT ILLNESS:  This is a 64-year right-handed female who was admitted yesterday on 11/28/2019, she states for elevated blood pressure at home of 146/102.  After arrival, she had multiple complaints including headache, chest pain, blurry vision and tells me she cannot use her right arm, however, on review of the chart, this is a chronic complaint for which she has seen Dr. Dolores Lory recently 12/2018.  She had a normal workup which included an EMG, EEG, carotid Dopplers.  Her blood pressure on presentation here was 166/108.  She has undergone CT of the head, CTA of the head and neck, and MRI of the brain which are all unremarkable.  She only has mild chronic ischemic white matter disease.  The patient had an MRI of her cervical spine on 07/04/2019 with minimal degenerative changes seen and multiple MRIs of the cervical spine done in the past.  The patient also mentions pain down her left leg due to someone hitting a nerve during some procedure, this is also a chronic complaint.  The patient has extensive psych history which includes somatization, evaluated by Dr. Novella Rob in Neuropsychology in the past.  The patient is not on antiplatelet agent at home or oral anticoagulation.  No history of stroke.  No diabetes.  She does have hyperlipidemia but is not on a statin and does have hypertension.  No smoking.    PAST MEDICAL HISTORY:  1.  Mild cognitive impairment.  2.  Depression.  3.  Anxiety.  5.  Somatization.  6.  Conversion disorder.  7.  PTSD.  8.  Psychotic disorder.  9.  Diverticulosis.  10.  Hypertension.  11.  Hyperlipidemia.  12.  Tubal ligation.    REVIEW OF SYSTEMS:  As per past medical history or HPI, otherwise, reviewed and negative.    MEDICATIONS:  1. Aricept.  2.  Neurontin.  3.  Vitamin D.  4.  Mobic.  5.   Wellbutrin.  6.  Hyzaar.  7.  Lexapro.  8.  Rogaine.    ALLERGIES:  AMLODIPINE, LOSARTAN, NIFEDIPINE.    SOCIAL HISTORY:  She is married.  No tobacco, alcohol or drug use.  She is on disability for her memory and PTSD.    FAMILY HISTORY:  Mom and dad both had heart disease.    PHYSICAL EXAMINATION:  VITAL SIGNS:  Blood pressure 127/94, pulse 69, respiratory rate 14, satting 97% on room air, temperature is 97.8, BMI of 25.  GENERAL:  She is well-nourished, well-developed, healthy-appearing, lying in bed in no distress.  HEART:  Regular rate and rhythm without murmurs.  Carotids are 2+.  No bruits.  EXTREMITIES:  Warm with no edema.  She has 2+ radial pulses.  NEUROLOGIC:  Mental Status:  Alert and oriented x4.  Speech and language intact.  Attention, memory and fund of knowledge appropriate.  Cranial nerve examination shows no facial asymmetry or ptosis.  Her extraocular movements are intact without diplopia or nystagmus.  Her visual fields are full.  Pupils are equally round and reactive.  Her tongue midline.  Her palate elevates symmetrically.  Trapezius and sternocleidomastoid are 5/5.  Strength, sensation, hearing intact.  Motor exam is 5/5 throughout.  No pronator drift.  No tremor.  Sensory exam intact to light touch  and pinprick throughout.  Reflexes are symmetric.  Toes downgoing.  Coordination:  Intact to finger-to-nose, rapid alternating movements.  Gait not assessed at this time.    STUDIES AND REPORTS:  Reviewed above.  In addition, CBC, B12, CMP, magnesium, phosphorus, folate, TSH, all unremarkable.    ASSESSMENT AND PLAN:  This is a 64 year old right-handed female with mild cognitive impairment, depression, anxiety, somatization, conversion disorder, post-traumatic stress disorder, psychotic disorder, who came to the emergency department because of her high blood pressure and reports her only neurological issue is her chronic difficulties using her right arm and chronic pain down her left leg that she  attributes to someone injuring a nerve during the procedure, both again chronic with previous evaluations including NCS/EMG study of the right arm and leg, EEG, MRI of the cervical spine, and MRI of the brain in the past that were all unremarkable and on this admission, a CT of the head, CTA of the head and neck and MRI of the brain that are all unremarkable.  Her exam is nonfocal.  I have no suspicion for any acute neurological change.  She is stable for discharge from a neuro standpoint.      Jodi Mourning, MD      [id number]  D:  11/30/2019 8:02  T:  11/30/2019 11:10  JOB #:  [id number]

## 2019-11-28 NOTE — ED Notes (Signed)
Several weeks of chest pressure. Two-three days of dizziness.

## 2019-11-28 NOTE — ED Provider Notes (Signed)
ED Provider Notes by Delorse Limber, PA at 11/28/19 2348                Author: Delorse Limber, PA  Service: Emergency Medicine  Author Type: Physician Assistant       Filed: 11/29/19 0207  Date of Service: 11/28/19 2348  Status: Attested           Editor: Delorse Limber, PA (Physician Assistant)  Cosigner: Lanell Persons, MD at 11/29/19 [phone]          Attestation signed by Lanell Persons, MD at 11/29/19 [phone]          I was personally available for consultation in the emergency department.  I have reviewed the chart and agree with the documentation recorded by the [facility], including  the assessment, treatment plan, and disposition.   Lanell Persons, MD                                    64 year old female presenting to the ED "for my blood pressure."  Start Abilify earlier this month.  Multiple complaints.   Notes intermittently feeling lightheaded, ongoing since last week.  Reports feeling off balance as well.  States that she had something similar for a period in 2013.  + headache and "dull, stabbing" pain in the left side of the chest.  CP x 1 week.   Denies fever, cough, SOB, hemoptysis, leg swelling/pain. Denies abdominal pain.  Notes that pain is constant, specifically denies pleuritic pain, exertional symptoms, post-prandial pain.  Also notes some blurred vision x 2 weeks.  Had negative nulclear  stress test several years ago (per chart review0   ??   PMHx: depression, HTN, "transmental disconnect", hepatic steatosis         Attempted to call husband, no answer   ??      The history is provided by the patient.    Chest Pain (Angina)    Associated symptoms include dizziness and headaches. Pertinent negatives include no fever, no shortness of breath and no vomiting.    Hypertension    Associated symptoms include chest pain, headaches and dizziness. Pertinent negatives include no shortness of breath and no vomiting.             Past Medical History:        Diagnosis  Date         ?  Depression       ?  H/O  colonoscopy  09-24-12          diverticulosis         ?  Hepatic steatosis       ?  Hypertension       ?  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  Normal cardiac stress test  02-10-12         ?  Pneumonia               Past Surgical History:         Procedure  Laterality  Date          ?  HX GYN              tubal ligation  Family History:         Problem  Relation  Age of Onset          ?  Heart Disease  Mother            ?  Heart Disease  Father               Social History          Socioeconomic History         ?  Marital status:  MARRIED              Spouse name:  Not on file         ?  Number of children:  Not on file     ?  Years of education:  Not on file     ?  Highest education level:  Not on file       Occupational History        ?  Not on file       Social Needs         ?  Financial resource strain:  Not on file        ?  Food insecurity              Worry:  Not on file         Inability:  Not on file        ?  Transportation needs              Medical:  Not on file         Non-medical:  Not on file       Tobacco Use         ?  Smoking status:  Never Smoker     ?  Smokeless tobacco:  Never Used       Substance and Sexual Activity         ?  Alcohol use:  No     ?  Drug use:  No     ?  Sexual activity:  Yes              Partners:  Male         Birth control/protection:  None       Lifestyle        ?  Physical activity              Days per week:  Not on file         Minutes per session:  Not on file         ?  Stress:  Not on file       Relationships        ?  Social Engineer, manufacturing systems on phone:  Not on file         Gets together:  Not on file         Attends religious service:  Not on file         Active member of club or organization:  Not on file         Attends meetings of clubs or organizations:  Not on file         Relationship status:  Not on file        ?  Intimate partner  violence              Fear of  current or ex partner:  Not on file         Emotionally abused:  Not on file         Physically abused:  Not on file         Forced sexual activity:  Not on file        Other Topics  Concern        ?  Not on file       Social History Narrative        ?  Not on file              ALLERGIES: Amlodipine, Losartan, and Nifedipine      Review of Systems    Constitutional: Negative for fever.    HENT: Negative for facial swelling.     Respiratory: Negative for shortness of breath.     Cardiovascular: Positive for chest pain.    Gastrointestinal: Negative for vomiting.    Skin: Negative for wound.    Neurological: Positive for dizziness, light-headedness  and headaches. Negative for syncope.    All other systems reviewed and are negative.           Vitals:           11/28/19 2103  11/29/19 0200         BP:  (!) 166/108  (!) 145/95     Pulse:  78  63     Resp:  16  16     Temp:  97.5 ??F (36.4 ??C)       SpO2:  98%  100%     Weight:    73.9 kg (163 lb)         Height:    5\' 7"  (1.702 m)                Physical Exam   Vitals signs and nursing note reviewed.   Constitutional:        General: She is not in acute distress.     Appearance: She is well-developed.      Comments: Elderly, somewhat slow black female, no distress    HENT :       Head: Normocephalic and atraumatic.      Right Ear: External ear normal.      Left Ear: External ear normal.    Eyes:       General: No scleral icterus.     Conjunctiva/sclera: Conjunctivae normal.    Neck:       Musculoskeletal: Neck supple.      Trachea: No tracheal deviation.    Cardiovascular:       Rate and Rhythm: Normal rate and regular rhythm.      Heart sounds: Normal heart sounds. No murmur. No friction rub. No gallop.     Pulmonary:       Effort: Pulmonary effort is normal. No respiratory distress.      Breath sounds: Normal breath sounds. No stridor. No wheezing.    Abdominal:      General: There is no distension.      Palpations: Abdomen is soft.      Musculoskeletal: Normal range of motion.    Skin:      General: Skin is warm and dry.   Neurological :       Mental Status: She is alert.      Comments: No cranial nerve deficits  Somewhat clumsy with finger-to-nose, difficult to ascertain if patient is truly having difficulty or if she  is  having difficulty understanding the instructions  Oriented to person and place, somewhat to time, does seem somewhat confused on the timeframe of her symptoms  5/5 grip strength bilaterally    Psychiatric :         Behavior: Behavior normal.              MDM   Number of Diagnoses or Management Options   Atypical chest pain   Dizziness   Hypertension, unspecified type   Vision changes   Diagnosis management comments: 64 year old female presenting to the ED for 1-2 weeks of CP, headache, also reports feeling dizzy, off balance, vision changes.  Overall reassuring work-up in the  ED, however given neurologic symptoms, comorbidities, will consult  medicine for admission for possible CVA work-up.          Amount and/or Complexity of Data Reviewed   Clinical lab tests: ordered and reviewed   Tests in the radiology section of CPT??: ordered and reviewed    Discuss the patient with other providers: yes (Dr. Dawna Part, ED attending.  Hospitalist)                Procedures

## 2019-11-28 NOTE — ED Provider Notes (Incomplete)
64 year old female presenting to the ED "for my blood pressure."  Start Abilify earlier this month.  Multiple complaints.  Notes intermittently feeling lightheaded, ongoing since last week.  Reports feeling off balance as well.  States that she had something similar for a period in 2013.  + headache and "dull, stabbing" pain in the left side of the chest.  CP x 1 week.  Denies fever, cough, SOB, hemoptysis, leg swelling/pain. Denies abdominal pain.  Notes that pain is constant, specifically denies pleuritic pain, exertional symptoms, post-prandial pain.  Also notes some blurred vision x 2 weeks.  Had negative nulclear stress test several years ago (per chart review0  ??  PMHx: depression, HTN, "transmental disconnect", hepatic steatosis  ??    The history is provided by the patient.   Chest Pain (Angina)   Associated symptoms include dizziness and headaches. Pertinent negatives include no fever, no shortness of breath and no vomiting.   Hypertension   Associated symptoms include chest pain, headaches and dizziness. Pertinent negatives include no shortness of breath and no vomiting.        Past Medical History:   Diagnosis Date   ??? Depression    ??? H/O colonoscopy 09-24-12    diverticulosis   ??? Hepatic steatosis    ??? Hypertension    ??? 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 Normal cardiac stress test 02-10-12   ??? Pneumonia        Past Surgical History:   Procedure Laterality Date   ??? HX GYN      tubal ligation         Family History:   Problem Relation Age of Onset   ??? Heart Disease Mother    ??? Heart Disease Father        Social History     Socioeconomic History   ??? Marital status: MARRIED     Spouse name: Not on file   ??? Number of children: Not on file   ??? Years of education: Not on file   ??? Highest education level: Not on file   Occupational History   ??? Not on file   Social Needs   ??? Financial resource strain: Not on file    ??? Food insecurity     Worry: Not on file     Inability: Not on file   ??? Transportation needs     Medical: Not on file     Non-medical: Not on file   Tobacco Use   ??? Smoking status: Never Smoker   ??? Smokeless tobacco: Never Used   Substance and Sexual Activity   ??? Alcohol use: No   ??? Drug use: No   ??? Sexual activity: Yes     Partners: Male     Birth control/protection: None   Lifestyle   ??? Physical activity     Days per week: Not on file     Minutes per session: Not on file   ??? Stress: Not on file   Relationships   ??? Social Wellsite geologist on phone: Not on file     Gets together: Not on file     Attends religious service: Not on file     Active member of club or organization: Not on file     Attends meetings of clubs or organizations: Not on file     Relationship status: Not on file   ???  Intimate partner violence     Fear of current or ex partner: Not on file     Emotionally abused: Not on file     Physically abused: Not on file     Forced sexual activity: Not on file   Other Topics Concern   ??? Not on file   Social History Narrative   ??? Not on file         ALLERGIES: Amlodipine, Losartan, and Nifedipine    Review of Systems   Constitutional: Negative for fever.   HENT: Negative for facial swelling.    Respiratory: Negative for shortness of breath.    Cardiovascular: Positive for chest pain.   Gastrointestinal: Negative for vomiting.   Skin: Negative for wound.   Neurological: Positive for dizziness, light-headedness and headaches. Negative for syncope.   All other systems reviewed and are negative.      Vitals:    11/28/19 2103   BP: (!) 166/108   Pulse: 78   Resp: 16   Temp: 97.5 ??F (36.4 ??C)   SpO2: 98%            Physical Exam     MDM       Procedures          Pt reports still feeling dizzy after meclizine.  Discussed with ED attending, agrees with admission for possible CVA work up.  Leanord Hawking, PA  12:18 AM      [facility] Consult for Admission  12:18 AM    ED Room Number: ER21/21   Patient Name and age:  Vanessa [name] 64 y.o.  female  Working Diagnosis:   1. Dizziness    2. Vision changes    3. Hypertension, unspecified type    4. Atypical chest pain        COVID-19 Suspicion:  no  Sepsis present:  no  Reassessment needed: no  Code Status:  Full Code  Readmission: no  Isolation Requirements:  no  Recommended Level of Care:  med/surg  Department:[facility] ED - [phone]  Other:  64 year old - off balance, headache, clumsy finger to nose, vision changes - needs admission for CVA work up - symptoms for about a week - negative CT head/CTA

## 2019-11-28 NOTE — ED Provider Notes (Signed)
64 year old female presenting to the ED "for my blood pressure."  Start Abilify earlier this month.  Multiple complaints.  Notes intermittently feeling lightheaded, ongoing since last week.  Reports feeling off balance as well.  States that she had something similar for a period in 2013.  + headache and "dull, stabbing" pain in the left side of the chest.  CP x 1 week.  Denies fever, cough, SOB, hemoptysis, leg swelling/pain. Denies abdominal pain.  Notes that pain is constant, specifically denies pleuritic pain, exertional symptoms, post-prandial pain.  Also notes some blurred vision x 2 weeks.  Had negative nulclear stress test several years ago (per chart review0  ??  PMHx: depression, HTN, "transmental disconnect", hepatic steatosis      Attempted to call husband, no answer  ??    The history is provided by the patient.   Chest Pain (Angina)   Associated symptoms include dizziness and headaches. Pertinent negatives include no fever, no shortness of breath and no vomiting.   Hypertension   Associated symptoms include chest pain, headaches and dizziness. Pertinent negatives include no shortness of breath and no vomiting.        Past Medical History:   Diagnosis Date   ??? Depression    ??? H/O colonoscopy 09-24-12    diverticulosis   ??? Hepatic steatosis    ??? Hypertension    ??? 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 Normal cardiac stress test 02-10-12   ??? Pneumonia        Past Surgical History:   Procedure Laterality Date   ??? HX GYN      tubal ligation         Family History:   Problem Relation Age of Onset   ??? Heart Disease Mother    ??? Heart Disease Father        Social History     Socioeconomic History   ??? Marital status: MARRIED     Spouse name: Not on file   ??? Number of children: Not on file   ??? Years of education: Not on file   ??? Highest education level: Not on file   Occupational History   ??? Not on file   Social Needs    ??? Financial resource strain: Not on file   ??? Food insecurity     Worry: Not on file     Inability: Not on file   ??? Transportation needs     Medical: Not on file     Non-medical: Not on file   Tobacco Use   ??? Smoking status: Never Smoker   ??? Smokeless tobacco: Never Used   Substance and Sexual Activity   ??? Alcohol use: No   ??? Drug use: No   ??? Sexual activity: Yes     Partners: Male     Birth control/protection: None   Lifestyle   ??? Physical activity     Days per week: Not on file     Minutes per session: Not on file   ??? Stress: Not on file   Relationships   ??? Social Wellsite geologist on phone: Not on file     Gets together: Not on file     Attends religious service: Not on file     Active member of club or organization: Not on file     Attends meetings of clubs or organizations: Not on file  Relationship status: Not on file   ??? Intimate partner violence     Fear of current or ex partner: Not on file     Emotionally abused: Not on file     Physically abused: Not on file     Forced sexual activity: Not on file   Other Topics Concern   ??? Not on file   Social History Narrative   ??? Not on file         ALLERGIES: Amlodipine, Losartan, and Nifedipine    Review of Systems   Constitutional: Negative for fever.   HENT: Negative for facial swelling.    Respiratory: Negative for shortness of breath.    Cardiovascular: Positive for chest pain.   Gastrointestinal: Negative for vomiting.   Skin: Negative for wound.   Neurological: Positive for dizziness, light-headedness and headaches. Negative for syncope.   All other systems reviewed and are negative.      Vitals:    11/28/19 2103 11/29/19 0200   BP: (!) 166/108 (!) 145/95   Pulse: 78 63   Resp: 16 16   Temp: 97.5 ??F (36.4 ??C)    SpO2: 98% 100%   Weight:  73.9 kg (163 lb)   Height:  5\' 7"  (1.702 m)            Physical Exam  Vitals signs and nursing note reviewed.   Constitutional:       General: She is not in acute distress.     Appearance: She is well-developed.       Comments: Elderly, somewhat slow black female, no distress   HENT:      Head: Normocephalic and atraumatic.      Right Ear: External ear normal.      Left Ear: External ear normal.   Eyes:      General: No scleral icterus.     Conjunctiva/sclera: Conjunctivae normal.   Neck:      Musculoskeletal: Neck supple.      Trachea: No tracheal deviation.   Cardiovascular:      Rate and Rhythm: Normal rate and regular rhythm.      Heart sounds: Normal heart sounds. No murmur. No friction rub. No gallop.    Pulmonary:      Effort: Pulmonary effort is normal. No respiratory distress.      Breath sounds: Normal breath sounds. No stridor. No wheezing.   Abdominal:      General: There is no distension.      Palpations: Abdomen is soft.   Musculoskeletal: Normal range of motion.   Skin:     General: Skin is warm and dry.   Neurological:      Mental Status: She is alert.      Comments: No cranial nerve deficits  Somewhat clumsy with finger-to-nose, difficult to ascertain if patient is truly having difficulty or if she is having difficulty understanding the instructions  Oriented to person and place, somewhat to time, does seem somewhat confused on the timeframe of her symptoms  5/5 grip strength bilaterally   Psychiatric:         Behavior: Behavior normal.          MDM  Number of Diagnoses or Management Options  Atypical chest pain  Dizziness  Hypertension, unspecified type  Vision changes  Diagnosis management comments: 64 year old female presenting to the ED for 1-2 weeks of CP, headache, also reports feeling dizzy, off balance, vision changes.  Overall reassuring work-up in the ED, however given neurologic  symptoms, comorbidities, will consult  medicine for admission for possible CVA work-up.       Amount and/or Complexity of Data Reviewed  Clinical lab tests: ordered and reviewed  Tests in the radiology section of CPT??: ordered and reviewed   Discuss the patient with other providers: yes (Dr. Dawna Part, ED attending.  Hospitalist)           Procedures

## 2019-11-28 NOTE — ED Triage Notes (Signed)
Several weeks of chest pressure. Two-three days of dizziness.

## 2019-11-28 NOTE — ED Notes (Deleted)
64 year old female presenting to the ED "for my blood pressure."  Start Abilify earlier this month.  Multiple complaints.  Notes intermittently feeling lightheaded, ongoing since last week.  Reports feeling off balance as well.  States that she had something similar for a period in 2013.  + headache and "dull, stabbing" pain in the left side of the chest.  CP x 1 week.  Denies fever, cough, SOB, hemoptysis, leg swelling/pain. Denies abdominal pain.  Notes that pain is constant, specifically denies pleuritic pain, exertional symptoms, post-prandial pain.  Also notes some blurred vision x 2 weeks.  Had negative nulclear stress test several years ago (per chart review0    PMHx: depression, HTN, "transmental disconnect", hepatic steatosis    Pt is confused, somewhat poor historian.  Difficult to obtain a history and seems to have a hard time following conversation.  Barely able to do finger to nose, very slow and deliberate.  Notes that she has been unable to walk today.  Will order additional studies and move to core.    Delorse Limber, PA  9:30 PM

## 2019-11-29 ENCOUNTER — Inpatient Hospital Stay: Admit: 2019-11-29 | Payer: BLUE CROSS/BLUE SHIELD | Primary: Family Medicine

## 2019-11-29 ENCOUNTER — Emergency Department: Admit: 2019-11-29 | Payer: BLUE CROSS/BLUE SHIELD | Primary: Family Medicine

## 2019-11-29 ENCOUNTER — Inpatient Hospital Stay
Admit: 2019-11-29 | Discharge: 2019-11-29 | Disposition: A | Discharge status: Home / Self Care | Payer: BLUE CROSS/BLUE SHIELD | Attending: Family Medicine | Admitting: Family Medicine

## 2019-11-29 LAB — EKG 12-LEAD
Atrial Rate: 69 {beats}/min
Diagnosis: NORMAL
P Axis: 76 degrees
P-R Interval: 152 ms
Q-T Interval: 378 ms
QRS Duration: 82 ms
QTc Calculation (Bazett): 405 ms
R Axis: 37 degrees
T Axis: 46 degrees
Ventricular Rate: 69 {beats}/min

## 2019-11-29 LAB — CBC WITH AUTO DIFFERENTIAL
Basophils %: 1 % (ref 0–1)
Basophils Absolute: 0.1 10*3/uL (ref 0.0–0.1)
Eosinophils %: 4 % (ref 0–7)
Eosinophils Absolute: 0.2 10*3/uL (ref 0.0–0.4)
Granulocyte Absolute Count: 0 10*3/uL (ref 0.00–0.04)
Hematocrit: 40.3 % (ref 35.0–47.0)
Hemoglobin: 13.6 g/dL (ref 11.5–16.0)
Immature Granulocytes: 0 % (ref 0.0–0.5)
Lymphocytes %: 37 % (ref 12–49)
Lymphocytes Absolute: 2.4 10*3/uL (ref 0.8–3.5)
MCH: 31.8 PG (ref 26.0–34.0)
MCHC: 33.7 g/dL (ref 30.0–36.5)
MCV: 94.2 FL (ref 80.0–99.0)
MPV: 9 FL (ref 8.9–12.9)
Monocytes %: 4 % — ABNORMAL LOW (ref 5–13)
Monocytes Absolute: 0.3 10*3/uL (ref 0.0–1.0)
NRBC Absolute: 0 10*3/uL (ref 0.00–0.01)
Neutrophils %: 54 % (ref 32–75)
Neutrophils Absolute: 3.6 10*3/uL (ref 1.8–8.0)
Nucleated RBCs: 0 PER 100 WBC
Platelets: 373 10*3/uL (ref 150–400)
RBC: 4.28 M/uL (ref 3.80–5.20)
RDW: 13.5 % (ref 11.5–14.5)
WBC: 6.6 10*3/uL (ref 3.6–11.0)

## 2019-11-29 LAB — COMPREHENSIVE METABOLIC PANEL
ALT: 27 U/L (ref 12–78)
AST: 20 U/L (ref 15–37)
Albumin/Globulin Ratio: 1.1 (ref 1.1–2.2)
Albumin: 4 g/dL (ref 3.5–5.0)
Alkaline Phosphatase: 69 U/L (ref 45–117)
Anion Gap: 4 mmol/L — ABNORMAL LOW (ref 5–15)
BUN: 17 MG/DL (ref 6–20)
Bun/Cre Ratio: 17 (ref 12–20)
CO2: 28 mmol/L (ref 21–32)
Calcium: 9.6 MG/DL (ref 8.5–10.1)
Chloride: 106 mmol/L (ref 97–108)
Creatinine: 1.02 MG/DL (ref 0.55–1.02)
EGFR IF NonAfrican American: 55 mL/min/{1.73_m2} — ABNORMAL LOW (ref >60)
GFR African American: >60 mL/min/{1.73_m2} (ref >60)
Globulin: 3.6 g/dL (ref 2.0–4.0)
Glucose: 98 mg/dL (ref 65–100)
Potassium: 3.5 mmol/L (ref 3.5–5.1)
Sodium: 138 mmol/L (ref 136–145)
Total Bilirubin: 0.2 MG/DL (ref 0.2–1.0)
Total Protein: 7.6 g/dL (ref 6.4–8.2)

## 2019-11-29 LAB — TRANSTHORACIC ECHOCARDIOGRAM (TTE) COMPLETE (CONTRAST/BUBBLE/3D PRN)
AV Peak Gradient: 10.46 mmHg
AV Peak Velocity: 161.68 cm/s
Aortic Root: 2.88 cm
IVSd: 1.04 cm — AB (ref 0.6–0.9)
LA Major Axis: 3.13 cm
LA Minor Axis: 1.7 cm
LV Mass 2D Index: 52.2 g/m2 (ref 43–95)
LV Mass 2D: 96.3 g (ref 67–162)
LVIDd: 3.56 cm — AB (ref 3.9–5.3)
LVIDs: 2.36 cm
LVOT Peak Gradient: 3.92 mmHg
LVOT Peak Velocity: 98.94 cm/s
LVPWd: 0.83 cm (ref 0.6–0.9)
Left Ventricular Ejection Fraction: 58
MV A Velocity: 88.57 cm/s
MV Area by PHT: 2.65 cm2
MV E Velocity: 50.96 cm/s
MV E Wave Deceleration Time: 286.49 ms
MV E/A: 0.58
MV PHT: 83.08 ms
PV Systolic Peak Instantaneous Gradient: 4.18 mmHg
RVIDd: 3.16 cm
TAPSE: 2.13 cm — AB (ref 1.5–2)
TR Max Velocity: 262.43 cm/s
TR Peak Gradient: 27.55 mmHg

## 2019-11-29 LAB — TROPONIN
Troponin I: <0.05 ng/mL (ref <0.05)
Troponin I: <0.05 ng/mL (ref <0.05)
Troponin I: <0.05 ng/mL (ref <0.05)

## 2019-11-29 LAB — PHOSPHORUS
Phosphorus: 3.6 MG/DL (ref 2.6–4.7)
Phosphorus: 3.6 MG/DL (ref 2.6–4.7)

## 2019-11-29 LAB — FOLATE
Folate: 17.3 ng/mL (ref 5.0–21.0)
Folate: 17.3 ng/mL (ref 5.0–21.0)

## 2019-11-29 LAB — TSH 3RD GENERATION
TSH: 2.11 u[IU]/mL (ref 0.36–3.74)
TSH: 2.11 u[IU]/mL (ref 0.36–3.74)

## 2019-11-29 LAB — MAGNESIUM
Magnesium: 2.5 mg/dL — ABNORMAL HIGH (ref 1.6–2.4)
Magnesium: 2.5 mg/dL — ABNORMAL HIGH (ref 1.6–2.4)

## 2019-11-29 LAB — VITAMIN B12
Vitamin B-12: 1347 pg/mL — ABNORMAL HIGH (ref 193–986)
Vitamin B12: 1347 pg/mL — ABNORMAL HIGH (ref 193–986)

## 2019-11-29 LAB — CBC WITH AUTOMATED DIFF
ABS. BASOPHILS: 0.1 10*3/uL (ref 0.0–0.1)
ABS. EOSINOPHILS: 0.2 10*3/uL (ref 0.0–0.4)
ABS. IMM. GRANS.: 0 10*3/uL (ref 0.00–0.04)
ABS. LYMPHOCYTES: 2.4 10*3/uL (ref 0.8–3.5)
ABS. MONOCYTES: 0.3 10*3/uL (ref 0.0–1.0)
ABS. NEUTROPHILS: 3.6 10*3/uL (ref 1.8–8.0)
ABSOLUTE NRBC: 0 10*3/uL (ref 0.00–0.01)
BASOPHILS: 1 % (ref 0–1)
EOSINOPHILS: 4 % (ref 0–7)
HCT: 40.3 % (ref 35.0–47.0)
HGB: 13.6 g/dL (ref 11.5–16.0)
IMMATURE GRANULOCYTES: 0 % (ref 0.0–0.5)
LYMPHOCYTES: 37 % (ref 12–49)
MCH: 31.8 PG (ref 26.0–34.0)
MCHC: 33.7 g/dL (ref 30.0–36.5)
MCV: 94.2 FL (ref 80.0–99.0)
MONOCYTES: 4 % — ABNORMAL LOW (ref 5–13)
MPV: 9 FL (ref 8.9–12.9)
NEUTROPHILS: 54 % (ref 32–75)
NRBC: 0 PER 100 WBC
PLATELET: 373 10*3/uL (ref 150–400)
RBC: 4.28 M/uL (ref 3.80–5.20)
RDW: 13.5 % (ref 11.5–14.5)
WBC: 6.6 10*3/uL (ref 3.6–11.0)

## 2019-11-29 LAB — ECHO ADULT COMPLETE
AV Peak Gradient: 10.46 mmHg
AV Peak Velocity: 161.68 cm/s
Aortic Root: 2.88 cm
IVSd: 1.04 cm — AB (ref 0.6–0.9)
LA Major Axis: 3.13 cm
LA Minor Axis: 1.7 cm
LV Mass 2D Index: 52.2 g/m2 (ref 43–95)
LV Mass 2D: 96.3 g (ref 67–162)
LVIDd: 3.56 cm — AB (ref 3.9–5.3)
LVIDs: 2.36 cm
LVOT Peak Gradient: 3.92 mmHg
LVOT Peak Velocity: 98.94 cm/s
LVPWd: 0.83 cm (ref 0.6–0.9)
MV A Velocity: 88.57 cm/s
MV Area by PHT: 2.65 cm2
MV E Velocity: 50.96 cm/s
MV E Wave Deceleration Time: 286.49 ms
MV E/A: 0.58
MV PHT: 83.08 ms
PV Systolic Peak Instantaneous Gradient: 4.18 mmHg
RVIDd: 3.16 cm
TAPSE: 2.13 cm — AB (ref 1.5–2.0)
TR Max Velocity: 262.43 cm/s
TR Peak Gradient: 27.55 mmHg

## 2019-11-29 LAB — EKG, 12 LEAD, INITIAL
Atrial Rate: 69 {beats}/min
Calculated P Axis: 76 degrees
Calculated R Axis: 37 degrees
Calculated T Axis: 46 degrees
Diagnosis: NORMAL
P-R Interval: 152 ms
Q-T Interval: 378 ms
QRS Duration: 82 ms
QTC Calculation (Bezet): 405 ms
Ventricular Rate: 69 {beats}/min

## 2019-11-29 LAB — METABOLIC PANEL, COMPREHENSIVE
A-G Ratio: 1.1 (ref 1.1–2.2)
ALT (SGPT): 27 U/L (ref 12–78)
AST (SGOT): 20 U/L (ref 15–37)
Albumin: 4 g/dL (ref 3.5–5.0)
Alk. phosphatase: 69 U/L (ref 45–117)
Anion gap: 4 mmol/L — ABNORMAL LOW (ref 5–15)
BUN/Creatinine ratio: 17 (ref 12–20)
BUN: 17 MG/DL (ref 6–20)
Bilirubin, total: 0.2 MG/DL (ref 0.2–1.0)
CO2: 28 mmol/L (ref 21–32)
Calcium: 9.6 MG/DL (ref 8.5–10.1)
Chloride: 106 mmol/L (ref 97–108)
Creatinine: 1.02 MG/DL (ref 0.55–1.02)
GFR est AA: >60 mL/min/{1.73_m2} (ref >60)
GFR est non-AA: 55 mL/min/{1.73_m2} — ABNORMAL LOW (ref >60)
Globulin: 3.6 g/dL (ref 2.0–4.0)
Glucose: 98 mg/dL (ref 65–100)
Potassium: 3.5 mmol/L (ref 3.5–5.1)
Protein, total: 7.6 g/dL (ref 6.4–8.2)
Sodium: 138 mmol/L (ref 136–145)

## 2019-11-29 LAB — TROPONIN I
Troponin-I, Qt.: <0.05 ng/mL (ref <0.05)
Troponin-I, Qt.: <0.05 ng/mL (ref <0.05)
Troponin-I, Qt.: <0.05 ng/mL (ref <0.05)

## 2019-11-29 LAB — SAMPLES BEING HELD: SAMPLES BEING HELD: 1

## 2019-11-29 MED ORDER — SODIUM CHLORIDE 0.9 % IJ SYRG
Freq: Three times a day (TID) | INTRAMUSCULAR | Status: DC
Start: 2019-11-29 — End: 2019-11-29
  Administered 2019-11-29: 20:00:00 via INTRAVENOUS

## 2019-11-29 MED ORDER — HYDRALAZINE 10 MG TAB
10 mg | ORAL_TABLET | Freq: Three times a day (TID) | ORAL | 0 refills | Status: AC
Start: 2019-11-29 — End: ?

## 2019-11-29 MED ORDER — ESCITALOPRAM 10 MG TAB
10 mg | Freq: Every day | ORAL | Status: DC
Start: 2019-11-29 — End: 2019-11-29
  Administered 2019-11-29: 15:00:00 via ORAL

## 2019-11-29 MED ORDER — ACETAMINOPHEN 650 MG RECTAL SUPPOSITORY
650 mg | Freq: Four times a day (QID) | RECTAL | Status: DC | PRN
Start: 2019-11-29 — End: 2019-11-29

## 2019-11-29 MED ORDER — LOSARTAN 50 MG TAB
50 mg | Freq: Every day | ORAL | Status: DC
Start: 2019-11-29 — End: 2019-11-29
  Administered 2019-11-29: 15:00:00 via ORAL

## 2019-11-29 MED ORDER — ONDANSETRON (PF) 4 MG/2 ML INJECTION
4 mg/2 mL | Freq: Four times a day (QID) | INTRAMUSCULAR | Status: DC | PRN
Start: 2019-11-29 — End: 2019-11-29

## 2019-11-29 MED ORDER — ACETAMINOPHEN 325 MG TABLET
325 mg | Freq: Four times a day (QID) | ORAL | Status: DC | PRN
Start: 2019-11-29 — End: 2019-11-29

## 2019-11-29 MED ORDER — IOPAMIDOL 76 % IV SOLN
76 % | Freq: Once | INTRAVENOUS | Status: AC
Start: 2019-11-29 — End: 2019-11-28
  Administered 2019-11-29: 03:00:00 via INTRAVENOUS

## 2019-11-29 MED ORDER — MECLIZINE 12.5 MG TAB
12.5 mg | ORAL | Status: AC
Start: 2019-11-29 — End: 2019-11-28
  Administered 2019-11-29: 05:00:00 via ORAL

## 2019-11-29 MED ORDER — GABAPENTIN 100 MG CAP
100 mg | Freq: Three times a day (TID) | ORAL | Status: DC
Start: 2019-11-29 — End: 2019-11-29
  Administered 2019-11-29: 15:00:00 via ORAL

## 2019-11-29 MED ORDER — SODIUM CHLORIDE 0.9 % IJ SYRG
INTRAMUSCULAR | Status: DC | PRN
Start: 2019-11-29 — End: 2019-11-29

## 2019-11-29 MED ORDER — HYDRALAZINE 10 MG TAB
10 mg | Freq: Three times a day (TID) | ORAL | Status: DC
Start: 2019-11-29 — End: 2019-11-29
  Administered 2019-11-29: 20:00:00 via ORAL

## 2019-11-29 MED ORDER — POLYETHYLENE GLYCOL 3350 17 GRAM (100 %) ORAL POWDER PACKET
17 gram | Freq: Every day | ORAL | Status: DC | PRN
Start: 2019-11-29 — End: 2019-11-29

## 2019-11-29 MED ORDER — PROMETHAZINE 25 MG TAB
25 mg | Freq: Four times a day (QID) | ORAL | Status: DC | PRN
Start: 2019-11-29 — End: 2019-11-29

## 2019-11-29 MED ORDER — HYDROCHLOROTHIAZIDE 25 MG TAB
25 mg | Freq: Every day | ORAL | Status: DC
Start: 2019-11-29 — End: 2019-11-29
  Administered 2019-11-29: 15:00:00 via ORAL

## 2019-11-29 MED ORDER — DONEPEZIL 5 MG TAB
5 mg | Freq: Every evening | ORAL | Status: DC
Start: 2019-11-29 — End: 2019-11-29
  Administered 2019-11-29: 08:00:00 via ORAL

## 2019-11-29 MED ORDER — BUPROPION SR 150 MG TAB
150 mg | Freq: Two times a day (BID) | ORAL | Status: DC
Start: 2019-11-29 — End: 2019-11-29
  Administered 2019-11-29: 15:00:00 via ORAL

## 2019-11-29 MED FILL — MECLIZINE 12.5 MG TAB: 12.5 mg | ORAL | Qty: 2

## 2019-11-29 MED FILL — ESCITALOPRAM 10 MG TAB: 10 mg | ORAL | Qty: 2

## 2019-11-29 MED FILL — BUPROPION SR 150 MG TAB: 150 mg | ORAL | Qty: 1

## 2019-11-29 MED FILL — LOSARTAN 50 MG TAB: 50 mg | ORAL | Qty: 2

## 2019-11-29 MED FILL — HYDRALAZINE 10 MG TAB: 10 mg | ORAL | Qty: 1

## 2019-11-29 MED FILL — ISOVUE-370  76 % INTRAVENOUS SOLUTION: 370 mg iodine /mL (76 %) | INTRAVENOUS | Qty: 100

## 2019-11-29 MED FILL — DONEPEZIL 5 MG TAB: 5 mg | ORAL | Qty: 1

## 2019-11-29 MED FILL — MONOJECT PREFILL ADVANCED 0.9 % SODIUM CHLORIDE INJECTION SYRINGE: INTRAMUSCULAR | Qty: 40

## 2019-11-29 MED FILL — GABAPENTIN 100 MG CAP: 100 mg | ORAL | Qty: 1

## 2019-11-29 MED FILL — HYDROCHLOROTHIAZIDE 25 MG TAB: 25 mg | ORAL | Qty: 1

## 2019-11-29 NOTE — Progress Notes (Signed)
 Problem: Self Care Deficits Care Plan (Adult)  Goal: *Acute Goals and Plan of Care (Insert Text)  Description: FUNCTIONAL STATUS PRIOR TO ADMISSION: Patient was independent and active without use of DME.     HOME SUPPORT: The patient lived with husband but did not require assist.    Occupational Therapy Goals  Initiated 11/29/2019  1.  Patient will perform ADLs standing 5 mins without fatigue or LOB with supervision/set-up within 5 day(s).  2.  Patient will perform lower body ADLs with supervision/set-up within 5 day(s).  3.  Patient will perform gathering ADL items high and low 2/2 with supervision/set-up within 5 day(s).  4.  Patient will perform toilet transfers with supervision/set-up within 5 day(s).  5.  Patient will perform all aspects of toileting with supervision/set-up within 5 day(s).  6.  Patient will participate in upper extremity therapeutic exercise/activities to increase independence with ADLs with supervision/set-up for 5 minutes within 5 day(s).      Outcome: Not Met    OCCUPATIONAL THERAPY EVALUATION  Patient: Vanessa [name] (64 y.o. female)  Date: 11/29/2019  Primary Diagnosis: CVA (cerebral vascular accident) [facility]) [I63.9]  Dizziness [R42]        Precautions:  Fall    ASSESSMENT  Based on the objective data described below, the patient presents with near baseline ADL performance s/p admission for CVA and dizziness. Imaging negative for acute intercranial process. Patient ADLs limited by pain in LUE, impaired balance and generalized weakness. Patient lives in 2 level home with husband and was IND/mod I at baseline.     Today, patient completed bed mobility with supervision and functional mobility with up to CGA. Patient able to donn jacket and socks/shoes with supervision. Patient returned to supine at end of session with call bell in reach, RN aware, all needs met. Will continue to follow for 1-2 more sessions to address safety - patient with 3 falls recently. Anticipate no follow-up needs once  medically cleared for D/C.      Current Level of Function Impacting Discharge (ADLs/self-care): supervision-CGA    Functional Outcome Measure:  The patient scored 60/100 on the Barthel Index outcome measure which is indicative of mild/moderate deficits in ADLs.      Other factors to consider for discharge: none - has good support and all needed DME     Patient will benefit from skilled therapy intervention to address the above noted impairments.       PLAN :  Recommendations and Planned Interventions: self care training, functional mobility training, therapeutic exercise, balance training, therapeutic activities, endurance activities, patient education, home safety training, and family training/education    Frequency/Duration: Patient will be followed by occupational therapy 5 times a week to address goals.    Recommendation for discharge: (in order for the patient to meet his/her long term goals)  No skilled occupational therapy/ follow up rehabilitation needs identified at this time.    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    neropsych eval -Ronal Bourdon.Ph.D with evidence of contributory psychiatric distress    Normal cardiac stress test 02-10-12    Pneumonia      Past Surgical History:   Procedure Laterality Date    HX GYN      tubal ligation       Expanded or extensive additional review of patient history:     Home Situation  Home Environment: Private residence  # Steps to Enter: 3(3 falls on steps last year)  Rails to Enter: Yes  Hand Rails :  Left  One/Two Story Residence: Two story, live on 1st floor  Living Alone: No  Support Systems: Games developer  Current DME Used/Available at Home: None  Tub or Shower Type: Tub/Shower combination    Hand dominance: Right    EXAMINATION OF PERFORMANCE DEFICITS:  Cognitive/Behavioral Status:  Neurologic State: Alert  Orientation Level: Oriented X4  Cognition: Appropriate for age attention/concentration;Follows commands  Perception: Appears intact  Perseveration: No perseveration noted  Safety/Judgement: Awareness of environment;Fall prevention    Skin: appears intact    Edema: none noted in BUEs    Hearing:       Vision/Perceptual:    Tracking: Able to track stimulus in all quadrants w/o difficulty    Diplopia: No    Acuity: Within Defined Limits    Corrective Lenses: Glasses    Range of Motion:  In BUEs   AROM: Generally decreased, functional    Strength:  In BUEs  Strength: Generally decreased, functional    Coordination:  Coordination: Within functional limits  Fine Motor Skills-Upper: Left Intact;Right Intact    Gross Motor Skills-Upper: Left Intact;Right Intact    Tone & Sensation:  In BUEs  Tone: Normal  Sensation: Impaired(LUE tingling/painful)     Balance:  Sitting: Intact  Standing: Impaired;Without support  Standing - Static: Fair  Standing - Dynamic : Fair    Functional Mobility and Transfers for ADLs:  Bed Mobility:  Supine to Sit: Contact guard assistance  Sit to Supine: Contact guard assistance    Transfers:  Sit to Stand: Contact guard assistance(ED plinth)  Stand to Sit: Contact guard assistance(ED plinth)    ADL Assessment:  Feeding: Independent    Oral Facial Hygiene/Grooming: Independent    Bathing: Supervision    Upper Body Dressing: Independent    Lower Body Dressing: Supervision    Toileting: Supervision     ADL Intervention and task modifications:    Upper Body Dressing Assistance  Front Opened Shirt: Independent    Lower Body Dressing Assistance  Socks: Supervision  Shoes  with Cloth Laces: Supervision  Leg Crossed Method Used: Yes  Position Performed: Seated edge of bed  Cues: Don    Cognitive Retraining  Safety/Judgement: Awareness of environment;Fall prevention    Functional Measure:  Barthel Index:    Bathing: 0  Bladder: 10  Bowels: 10  Grooming: 5  Dressing: 10  Feeding: 10  Mobility: 0  Stairs: 0  Toilet Use: 5  Transfer (Bed to Chair and Back): 10  Total: 60/100        The Barthel ADL Index: Guidelines  1. The index should be used as a record of what a patient does, not as a record of what a patient could do.  2. The main aim is to establish degree of independence from any help, physical or verbal, however minor and for whatever reason.  3. The need for supervision renders the patient not independent.  4. A patient's  performance should be established using the best available evidence. Asking the patient, friends/relatives and nurses are the usual sources, but direct observation and common sense are also important. However direct testing is not needed.  5. Usually the patient's performance over the preceding 24-48 hours is important, but occasionally longer periods will be relevant.  6. Middle categories imply that the patient supplies over 50 per cent of the effort.  7. Use of aids to be independent is allowed.    Henriette Desanctis., Barthel, D.W. [phone]). Functional evaluation: the Barthel Index. Md State Med J (14)2.  Fleeta der [place], J.J.M.F, Bond, DAIVD., Oneita [place]., [place], [place]. (1999). Measuring the change indisability after inpatient rehabilitation; comparison of the responsiveness of the Barthel Index and Functional Independence Measure. Journal of Neurology, Neurosurgery, and Psychiatry, 66(4), [phone].  Fleeta Chillington, N.J.A, [place],  W.J.M, & Koopmanschap, M.A. (2004.) Assessment of post-stroke quality of life in cost-effectiveness studies: The usefulness of the Barthel Index and the EuroQoL-5D. Quality of Life Research, 61, 572-56       Occupational Therapy  Evaluation Charge Determination   History Examination Decision-Making   LOW Complexity : Brief history review  LOW Complexity : 1-3 performance deficits relating to physical, cognitive , or psychosocial skils that result in activity limitations and / or participation restrictions  LOW Complexity : No comorbidities that affect functional and no verbal or physical assistance needed to complete eval tasks       Based on the above components, the patient evaluation is determined to be of the following complexity level: LOW   Pain Rating:  Reporting 10/10 pain in LUE    Activity Tolerance:   Good and requires rest breaks    After treatment patient left in no apparent distress:    Supine in bed, Call bell within reach, Side rails x 3, and RN aware    COMMUNICATION/EDUCATION:   The patient's plan of care was discussed with: Physical therapist and Registered nurse.     Home safety education was provided and the patient/caregiver indicated understanding. and Patient/family have participated as able in goal setting and plan of care.    This patient's plan of care is appropriate for delegation to OTA.    Thank you for this referral.  Laymon LITTIE Ards, OT  Time Calculation: 29 mins

## 2019-11-29 NOTE — ED Notes (Signed)
Verbal shift change report given to Sarah RN (oncoming nurse) by Kyle RN (offgoing nurse). Report included the following information SBAR, Kardex, ED Summary, MAR and Recent Results.

## 2019-11-29 NOTE — Consults (Signed)
Neuro consult completed. Dictated note to follow.  Pt is a 64yo RH female with MCI, depression, anxiety, somatization, conversion d/o, PTSD, psychotic d/o who tells me she came to the ED today due to high blood pressure. When asked what neurological problem she is having, she states that she cannot use her right arm and has pain down left leg after someone doing a procedure hit a nerve, both very chronic problems. Pt has been seen by multiple neurologists in [facility] and has had NCS/EMG of right U/LE 02/01/18 that was completely normal. EEG 02/01/18 normal. MRI C-spine 07/04/19 - with minimal degenerative changes. MRI brain w/wo 11/22/17 with chronic non-specific changes and stable from 04/2013. On this admission, CTH, CTA H/N, MRI brain wo contrast are all unremarkable. Exam is non-focal.  No suspicion for any acute neurological change.  Echo is pending. Stable for d/c from neuro standpoint. Please call with questions.

## 2019-11-29 NOTE — ED Notes (Signed)
Pt returned from Echo.  Pt placed back on monitor and provided with lunch.  Call bell within reach.

## 2019-11-29 NOTE — H&P (Signed)
History & Physical    Primary Care Provider: Electa Sniff, MD  Source of Information: Patient and chart review    History of Presenting Illness:   Vanessa [name] is a 64 y.o. 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  Patient states over the last week, she has noted increased dizziness which is progressed  and to an unbalanced gait over the last 3 days.  Patient endorses about 3 episodes of falls this week and states at one point she fell down her stairs.  She denies hitting her head, suffering any injuries or have any loss of consciousness.  She endorses mild intermittent headache.  Also endorses bilateral lower extremity weakness.  She denies any vision changes, dysphagia, hearing loss, tinnitus or odynophagia.  She has had no recent travel or sick contacts.  Additionally, patient also complains of chest pain for the last week.  She complains of an aching, intermittent, 7/10, left-sided chest pain that occurs both at rest and with activity.  She denies any aggravating or relieving factors.  Patient states she has had similar pain in the past and has had cardiac work-up several years ago which she states was unremarkable.  Admits to increasingly high BPs at home with systolic BPs in 130Q prior to ER presentation.    The patient denies any fever, chills, chest pain, cough, congestion, recent illness, palpitations, or dysuria.    On ER presentation, vital signs were remarkable for blood pressures of 160s over 108.  Chest x-ray showed no acute process.  CT head and CTA head and neck were unremarkable.     Review of Systems:  A comprehensive review of systems was negative except for that written in the History of Present Illness.     Past Medical History:   Diagnosis Date   ??? Depression    ??? H/O  colonoscopy 09-24-12    diverticulosis   ??? Hepatic steatosis    ??? Hypertension    ??? Mild neurocognitive disorder 10/03/2016    neropsych eval -Donavan Foil.Ph.D with evidence of contributory psychiatric distress   ??? Normal cardiac stress test 02-10-12   ??? Pneumonia       Past Surgical History:   Procedure Laterality Date   ??? HX GYN      tubal ligation     Prior to Admission medications    Medication Sig Start Date End Date Taking? Authorizing Provider   gabapentin (NEURONTIN) 100 mg capsule TAKE 1 CAPSULE BY MOUTH THREE TIMES A DAY 01/13/19   O'Laughlin, Joycelyn Das, NP   omega-3 acid ethyl esters (LOVAZA) 1 gram capsule TAKE 1 CAPSULE BY MOUTH EVERY DAY 11/25/18   Provider, Historical   donepeziL (ARICEPT) 5 mg tablet Take 5 mg by mouth nightly. 11/29/18   Provider, Historical   lidocaine-prilocaine (EMLA) topical cream Apply  to affected area as needed for Pain. 06/05/18   Epps, Arlyss Repress, MD   CHOLECALCIFEROL, VITAMIN D3, PO Take 5,000 Int'l Units/day by mouth.    Provider, Historical   meloxicam (MOBIC) 15 mg tablet Take 15 mg by mouth. 03/04/17   Provider, Historical   buPROPion XL (WELLBUTRIN XL) 300 mg XL tablet Take 300 mg by mouth daily. 01/14/18   Provider, Historical   losartan-hydroCHLOROthiazide (HYZAAR) 100-25 mg per tablet Take 1 Tab by mouth daily.    Provider, Historical   escitalopram oxalate (LEXAPRO) 20 mg tablet  Take 20 mg by mouth daily.    Provider, Historical   minoxidil (ROGAINE) 2 % external solution Apply  to affected area two (2) times a day. Apply to scalp and rub it in    Provider, Historical   VAYARIN 75-8.5-21.5 mg cap Take 1 Cap by mouth two (2) times a day. 05/29/14   Provider, Historical     Allergies   Allergen Reactions   ??? Amlodipine Other (comments)     Loss of memory,nervous,jettery   ??? Losartan Other (comments)     Nervous,jettery   ??? Nifedipine Other (comments)     Swelling,palpations      Family History   Problem Relation Age of Onset   ??? Heart Disease Mother    ??? Heart Disease Father          SOCIAL HISTORY:  Patient resides:  Independently x   Assisted Living    SNF    With family care       Smoking history:   None x   Former    Chronic      Alcohol history:   None x   Social    Chronic      Ambulates:   Independently x   w/cane    w/walker    w/wc    CODE STATUS:  DNR    Full x   Other      Objective:     Physical Exam:     Visit Vitals  BP (!) 133/92 (BP 1 Location: Right arm, BP Patient Position: At rest)   Pulse 62   Temp 98.9 ??F (37.2 ??C)   Resp 16   Ht 5' 7" (1.702 m)   Wt 73.9 kg (163 lb)   SpO2 93%   BMI 25.53 kg/m??      O2 Device: Room air    General:  Alert, cooperative, no distress, appears stated age.   Head:  Normocephalic, without obvious abnormality, atraumatic.   Eyes:  Conjunctivae/corneas clear. PERRL, EOMs intact.   Nose: Nares normal. Septum midline. Mucosa normal. No drainage or sinus tenderness.   Throat: Lips, mucosa, and tongue normal. Teeth and gums normal.   Neck: Supple, symmetrical, trachea midline, no adenopathy, thyroid: no enlargement/tenderness/nodules, no carotid bruit and no JVD.   Back:   Symmetric, no curvature. ROM normal. No CVA tenderness.   Lungs:   Clear to auscultation bilaterally.   Chest wall:  No tenderness or deformity.   Heart:  Regular rate and rhythm, S1, S2 normal, no murmur, click, rub or gallop.   Abdomen:   Soft, non-tender. Bowel sounds normal. No masses,  No organomegaly.   Extremities: Extremities normal, atraumatic, no cyanosis or edema.   Pulses: 2+ and symmetric all extremities.   Skin: Skin color, texture, turgor normal. No rashes or lesions   Neurologic: CNII-XII intact.  5/5 strength in bilateral upper and lower extremities.  No dysmetria.  No DDDK.  Gait not tested.         EKG:  normal EKG, normal sinus rhythm, unchanged from previous tracings.  Data Review:     Recent Days:  Recent Labs     11/28/19  2116   WBC 6.6   HGB 13.6   HCT 40.3   PLT 373     Recent Labs     11/28/19  2116   NA 138   K 3.5   CL 106   CO2 28   GLU 98   BUN 17  CREA 1.02   CA 9.6   MG 2.5*   PHOS 3.6   ALB 4.0   ALT 27     No results for input(s): PH, PCO2, PO2, HCO3, FIO2 in the last 72 hours.    24 Hour Results:  Recent Results (from the past 24 hour(s))   EKG, 12 LEAD, INITIAL    Collection Time: 11/28/19  9:10 PM   Result Value Ref Range    Ventricular Rate 69 BPM    Atrial Rate 69 BPM    P-R Interval 152 ms    QRS Duration 82 ms    Q-T Interval 378 ms    QTC Calculation (Bezet) 405 ms    Calculated P Axis 76 degrees    Calculated R Axis 37 degrees    Calculated T Axis 46 degrees    Diagnosis       Normal sinus rhythm  Nonspecific T wave abnormality  When compared with ECG of 06-Sep-2014 16:55,  QT has lengthened     SAMPLES BEING HELD    Collection Time: 11/28/19  9:16 PM   Result Value Ref Range    SAMPLES BEING HELD 1 RED, 1 BLU     COMMENT        Add-on orders for these samples will be processed based on acceptable specimen integrity and analyte stability, which may vary by analyte.   CBC WITH AUTOMATED DIFF    Collection Time: 11/28/19  9:16 PM   Result Value Ref Range    WBC 6.6 3.6 - 11.0 K/uL    RBC 4.28 3.80 - 5.20 M/uL    HGB 13.6 11.5 - 16.0 g/dL    HCT 40.3 35.0 - 47.0 %    MCV 94.2 80.0 - 99.0 FL    MCH 31.8 26.0 - 34.0 PG    MCHC 33.7 30.0 - 36.5 g/dL    RDW 13.5 11.5 - 14.5 %    PLATELET 373 150 - 400 K/uL    MPV 9.0 8.9 - 12.9 FL    NRBC 0.0 0 PER 100 WBC    ABSOLUTE NRBC 0.00 0.00 - 0.01 K/uL    NEUTROPHILS 54 32 - 75 %    LYMPHOCYTES 37 12 - 49 %    MONOCYTES 4 (L) 5 - 13 %    EOSINOPHILS 4 0 - 7 %    BASOPHILS 1 0 - 1 %    IMMATURE GRANULOCYTES 0 0.0 - 0.5 %    ABS. NEUTROPHILS 3.6 1.8 - 8.0 K/UL    ABS. LYMPHOCYTES 2.4 0.8 - 3.5 K/UL    ABS. MONOCYTES 0.3 0.0 - 1.0 K/UL    ABS. EOSINOPHILS 0.2 0.0 - 0.4 K/UL    ABS. BASOPHILS 0.1 0.0 - 0.1 K/UL    ABS. IMM. GRANS. 0.0 0.00 - 0.04 K/UL    DF AUTOMATED     METABOLIC PANEL, COMPREHENSIVE    Collection Time: 11/28/19  9:16 PM   Result Value Ref Range    Sodium 138 136 - 145 mmol/L    Potassium 3.5 3.5  - 5.1 mmol/L    Chloride 106 97 - 108 mmol/L    CO2 28 21 - 32 mmol/L    Anion gap 4 (L) 5 - 15 mmol/L    Glucose 98 65 - 100 mg/dL    BUN 17 6 - 20 MG/DL    Creatinine 1.02 0.55 - 1.02 MG/DL    BUN/Creatinine ratio 17 12 - 20  GFR est AA >60 >60 ml/min/1.49m    GFR est non-AA 55 (L) >60 ml/min/1.766m   Calcium 9.6 8.5 - 10.1 MG/DL    Bilirubin, total 0.2 0.2 - 1.0 MG/DL    ALT (SGPT) 27 12 - 78 U/L    AST (SGOT) 20 15 - 37 U/L    Alk. phosphatase 69 45 - 117 U/L    Protein, total 7.6 6.4 - 8.2 g/dL    Albumin 4.0 3.5 - 5.0 g/dL    Globulin 3.6 2.0 - 4.0 g/dL    A-G Ratio 1.1 1.1 - 2.2     TROPONIN I    Collection Time: 11/28/19  9:16 PM   Result Value Ref Range    Troponin-I, Qt. <0.05 <0.05 ng/mL   TROPONIN I    Collection Time: 11/28/19  9:16 PM   Result Value Ref Range    Troponin-I, Qt. <0.05 <0.05 ng/mL   MAGNESIUM    Collection Time: 11/28/19  9:16 PM   Result Value Ref Range    Magnesium 2.5 (H) 1.6 - 2.4 mg/dL   PHOSPHORUS    Collection Time: 11/28/19  9:16 PM   Result Value Ref Range    Phosphorus 3.6 2.6 - 4.7 MG/DL   FOLATE    Collection Time: 11/29/19  3:17 AM   Result Value Ref Range    Folate 17.3 5.0 - 21.0 ng/mL   TSH 3RD GENERATION    Collection Time: 11/29/19  3:17 AM   Result Value Ref Range    TSH 2.11 0.36 - 3.74 uIU/mL   TROPONIN I    Collection Time: 11/29/19  3:17 AM   Result Value Ref Range    Troponin-I, Qt. <0.05 <0.05 ng/mL   VITAMIN B12    Collection Time: 11/29/19  3:17 AM   Result Value Ref Range    Vitamin B12 1,347 (H) 193 - 986 pg/mL         Imaging:     Assessment:     AnARYANI DAFFERNs a [age].o. female with past medical history of major depressive disorder, neurocognitive deficits/transplantable disconnect hypertension who is admitted for CVA rule out and atypical angina.       Plan:        CVA r/o  -Patient with complaints of dizziness and ataxia for over 1 week  -CT head, CTA head and neck unremarkable  -MRI brain without contrast  - TTE with bubble study   - Lipid panel,  TSH, ESR/CRP,  serial troponins   - Check A1C, fingersticks and ISS in interim - avoid hypoglycemia   - Neuro checks and vital signs q4h   - Bedside swallow evaluation per nursing   - Observe on telemetry   - PT/OT, orthostatic vitals  - Fall precautions   - Neurology following, appreciate recommendations     Atypical Angina  -Chart review shows normal stress test in 2015  -Initial troponin negative, EKG visit without any acute ischemic changes  -Continue serial troponins  -Will likely need stress testing after CVA work-up  -Follow-up TTE as part of CVA work-up    Major depressive disorder  -Continue Wellbutrin and Lexapro    Neurocognitive deficits/transplantable disconnect   -Followed by psychiatrist Dr. SePhilippa Sicks-Continue Aricept    Hypertension  -Continue losartan and HCTZ                    FEN/GI -  NS @ 75 ml/hr  Activity - As tolerated  DVT prophylaxis - Lovenox  GI prophylaxis -  NI  Disposition - TBD    CODE STATUS:  Full code       Signed By: Marguarite Arbour, MD     November 29, 2019

## 2019-11-29 NOTE — ED Notes (Signed)
Pt off unit for echo.

## 2019-11-29 NOTE — Progress Notes (Signed)
 Problem: Mobility Impaired (Adult and Pediatric)  Goal: Interventions  Description: FUNCTIONAL STATUS PRIOR TO ADMISSION: Patient was independent and active without use of DME.  Has chronic LLE and RUE pain.      HOME SUPPORT PRIOR TO ADMISSION: The patient lived with spouse.    Physical Therapy Goals  Initiated 11/29/2019  1.  Patient will move from supine to sit and sit to supine , scoot up and down, and roll side to side in bed with modified independence within 7 day(s).    2.  Patient will transfer from bed to chair and chair to bed with modified independence using the least restrictive device within 7 day(s).  3.  Patient will perform sit to stand with modified independence within 7 day(s).  4.  Patient will ambulate with independence for 150 feet with the least restrictive device within 7 day(s).   5.  Patient will ascend/descend 4 stairs with handrail(s) with modified independence within 7 day(s).          Outcome: Not Met   PHYSICAL THERAPY EVALUATION  Patient: Vanessa [name] (64 y.o. female)  Date: 11/29/2019  Primary Diagnosis: CVA (cerebral vascular accident) [facility]) [I63.9]  Dizziness [R42]        Precautions:  Fall    ASSESSMENT  Based on the objective data described below, the patient presents with impaired functional mobility s/p admission for dizziness and CVA.  MRI shows no acute infarct.  Functional mobility is limited by light headedness with position changes (pt describes as dizziness though not room spinning), chronic pain (reported to be worse today), balance deficit, decreased tolerance to activity.   At baseline, patient is independent w/ transfers and ambulating with no device.  Today, she required CGA for bed mobility and sit<->stand and min assist to ambulate with no device.  Gait is most limited by pain, c/o light headedness on initial stance, and not donning shoes.  Noted improvement in gait quality and balance when ambulating w/ shoes donned though continued to require CGA.  BP was  assessed sup/sit/stand as documented below.   Therapy will follow patient to address impairments.  Anticipate return home with family and no therapy follow up vs HHPT.    Vitals:    11/29/19 0907 11/29/19 0908 11/29/19 0914   BP: (!) 142/92 (!) 137/96 (!) 168/98   BP 1 Location: Left arm Left arm Left arm   BP Patient Position: Supine Sitting Standing   Pulse: (!) 59 68 69   SpO2: 100%           Current Level of Function Impacting Discharge (mobility/balance): CGA for bed mobility and sit<->stand from plinth, Min assist to ambulate w/ no device    Functional Outcome Measure:  The patient scored 11/28 on the Tinetti outcome measure which is indicative of high fall risk.      Other factors to consider for discharge: PLOF indep, chronic pain, lives w/ spouse     Patient will benefit from skilled therapy intervention to address the above noted impairments.       PLAN :  Recommendations and Planned Interventions: bed mobility training, transfer training, gait training, therapeutic exercises, patient and family training/education, and therapeutic activities      Frequency/Duration: Patient will be followed by physical therapy:  5 times a week to address goals.    Recommendation for discharge: (in order for the patient to meet his/her long term goals)  To be determined: Anticipate home with no therapy follow up vs HHPT pending  progress.    This discharge recommendation:  Has not yet been discussed the attending provider and/or case management    IF patient discharges home will need the following DME: to be determined (TBD) - anticipate none         SUBJECTIVE:   Patient stated Do you have any CBD. They [OP] gave me that to use on my arm pain"    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/03/2016    neropsych eval -Ronal Bourdon.Ph.D with evidence of  contributory psychiatric distress    Normal cardiac stress test 02-10-12    Pneumonia      Past Surgical History:   Procedure Laterality Date    HX GYN      tubal ligation       Personal factors and/or comorbidities impacting plan of care: PMH    Home Situation  Home Environment: Private residence  # Steps to Enter: 3(3 falls on steps last year)  Rails to Enter: Yes  Hand Rails : Left  One/Two Story Residence: Two story, live on 1st floor  Living Alone: No(l/w spouse)  Support Systems: Games developer  Current DME Used/Available at Home: None  Tub or Shower Type: Tub/Shower combination    EXAMINATION/PRESENTATION/DECISION MAKING:   Critical Behavior:  Neurologic State: Alert  Orientation Level: Oriented X4  Cognition: Follows commands, Appropriate decision making, Appropriate for age attention/concentration     Hearing:  Intact  Skin:  Appears intact  Edema: None  Range Of Motion:  AROM: Generally decreased, functional                       Strength:    Strength: Generally decreased, functional                    Tone & Sensation:   Tone: Normal              Sensation: Impaired(LUE tingling)               Coordination:  Coordination: Within functional limits  Vision:   Diplopia: No  Reports blurred vision  Corrective Lenses: Glasses  Functional Mobility:  Bed Mobility:  Supine to Sit: Contact guard assistance  Sit to Supine: Contact guard assistance  Transfers:  Sit to Stand: Contact guard assistance(ED plinth)  Stand to Sit: Contact guard assistance(ED plinth)  Balance:   Sitting: Intact;Without support  Standing: Impaired;Without support  Standing - Static: Fair  Standing - Dynamic : Fair  Ambulation/Gait Training:  Distance (ft): 30 Feet  (amb again 20 ft with shoes donned w/ improvement in gait quality and balance)  Assistive Device: Gait belt  Ambulation - Level of Assistance: Contact guard assistance;Minimal assistance;Assist x1  Gait Description (WDL): Exceptions to WDL  Gait Abnormalities:  Decreased step clearance;Altered arm swing;Step to gait  Stance: Left decreased  Speed/Cadence: Slow  Step Length: Right shortened;Left shortened  Recommended wearing shoe if they offer arch support and assist in ambulation.       Functional Measure:  Tinetti test:    Sitting Balance: 1  Arises: 1  Attempts to Rise: 1  Immediate Standing Balance: 0  Standing Balance: 1  Nudged: 0  Eyes Closed: 0  Turn 360 Degrees - Continuous/Discontinuous: 0  Turn 360 Degrees - Steady/Unsteady: 1  Sitting Down: 1  Balance Score: 6 Balance total score  Indication of Gait: 0  R Step Length/Height: 0  L Step Length/Height: 1  R Foot Clearance: 1  L Foot Clearance: 1  Step Symmetry: 0  Step Continuity: 0  Path: 1  Trunk: 0(HHA)  Walking Time: 1  Gait Score: 5 Gait total score  Total Score: 11/28 Overall total score         Tinetti Tool Score Risk of Falls  <19 = High Fall Risk  19-24 = Moderate Fall Risk  25-28 = Low Fall Risk  Tinetti ME. Performance-Oriented Assessment of Mobility Problems in Elderly Patients. JAGS 1986; [id number]. (Scoring Description: PT Bulletin Feb. 10, 1993)    Older adults: Gaines et al, 2009; n = 1000 [place] elderly evaluated with ABC, POMA, ADL, and IADL)   Mean POMA score for males aged 65-79 years = 26.21(3.40)   Mean POMA score for females age 22-79 years = 25.16(4.30)   Mean POMA score for males over 80 years = 23.29(6.02)   Mean POMA score for females over 80 years = 17.20(8.32)            Physical Therapy Evaluation Charge Determination   History Examination Presentation Decision-Making   MEDIUM  Complexity : 1-2 comorbidities / personal factors will impact the outcome/ POC  LOW Complexity : 1-2 Standardized tests and measures addressing body structure, function, activity limitation and / or participation in recreation  LOW Complexity : Stable, uncomplicated  LOW Complexity : FOTO score of 75-100      Based on the above components, the patient evaluation is determined to be of the following complexity  level: LOW     Pain Rating:  Voiced 10/10 RUE and LLE pain.      Activity Tolerance:   Fair, requires rest breaks, and pain    After treatment patient left in no apparent distress:   Supine in bed, Call bell within reach, and Side rails x 3    COMMUNICATION/EDUCATION:   The patient's plan of care was discussed with: Occupational therapist and Registered nurse.     Fall prevention education was provided and the patient/caregiver indicated understanding., Patient/family have participated as able in goal setting and plan of care., and Patient/family agree to work toward stated goals and plan of care.    Thank you for this referral.  Doyal ONEIDA Mulberry, PT   Time Calculation: 28 mins

## 2019-11-29 NOTE — Discharge Summary (Signed)
Discharge  Summary by Fredrik Cove, MD at 11/29/19 1610                Author: Fredrik Cove, MD  Service: Hospitalist  Author Type: Physician       Filed: 11/29/19 1634  Date of Service: 11/29/19 1610  Status: Signed          Editor: Fredrik Cove, MD (Physician)                       Discharge Summary           PATIENT ID: Vanessa [name]   MRN: [id number]    DATE OF BIRTH: 23-Sep-1956     DATE OF ADMISSION: 11/28/2019 10:15 PM     DATE OF DISCHARGE: 11/29/2019    PRIMARY CARE PROVIDER: Katherina Right, MD        ATTENDING PHYSICIAN: Mertha Finders   DISCHARGING PROVIDER: Fredrik Cove, MD     To contact this individual call [phone] and ask the operator to page.  If unavailable ask to be transferred the Adult Hospitalist Department.      CONSULTATIONS: IP CONSULT TO NEUROLOGY      PROCEDURES/SURGERIES: * No surgery found *      ADMITTING DIAGNOSES & HOSPITAL COURSE:       HPI   Vanessa [name] is a 64 y.o. female with past medical history of major depressive disorder, neurocognitive deficits/transplantable disconnect hypertension who presents to  hospital today with chief complaint of dizziness, gait imbalance and chest pain.  Patient states over the last week, she has noted increased dizziness which is progressed  and to an unbalanced gait over the last 3 days.  Patient endorses about 3 episodes  of falls this week and states at one point she fell down her stairs.  She denies hitting her head, suffering any injuries or have any loss of consciousness.  She endorses mild intermittent headache.  Also endorses bilateral lower extremity weakness.   She denies any vision changes, dysphagia, hearing loss, tinnitus or odynophagia.  She has had no recent travel or sick contacts.   Additionally, patient also complains of chest pain for the last week.  She complains of an aching, intermittent, 7/10, left-sided chest pain that occurs both at rest and with activity.  She denies any aggravating or relieving factors.  Patient states   she has had similar pain in the past and has had cardiac work-up several years ago which she states was unremarkable.  Admits to increasingly high BPs at home with systolic BPs in 170s prior to ER presentation.      Hospital Course   Patient presents with dizziness. Work up with MRI of the brain shows no acute infarction. Noted mild chronic white matter disease. Echo with normal EF and mild (grade 1) diastolic dysfunction. Also patient with uncontrolled HTN, takes Losartan-HCTZ at  home. Added hydralazine during this admission. Recommended ambulatory BP check and follow up with PCP for reevaluation of BP. PT evaluated the patient and no weakness was noted.         DISCHARGE DIAGNOSES / PLAN:         1.    Dizziness   2.  Uncontrolled hypertension   3.  Depression   4.  Psychiatric disorder        ADDITIONAL CARE RECOMMENDATIONS:       Recommend ambulatory BP check once or twice a day   Follow up with  PCP in one week for reevaluation of HTN       PENDING TEST RESULTS:    At the time of discharge the following test results are still pending: None      FOLLOW UP APPOINTMENTS:       Follow-up Information               Follow up With  Specialties  Details  Why  Contact Info              Electa Sniff, MD  Family Medicine  In 1 week    [place] [address]   [phone]                       DIET: Cardiac Diet   Oral Nutritional Supplements: No Oral Supplement prescribed      ACTIVITY: Recommend against strenuous activities for next 2-3 days      WOUND CARE: None      EQUIPMENT needed: None         DISCHARGE MEDICATIONS:     Current Discharge Medication List              START taking these medications          Details        hydrALAZINE (APRESOLINE) 10 mg tablet  Take 1 Tab by mouth three (3) times daily.   Qty: 90 Tab, Refills:  0                     CONTINUE these medications which have NOT CHANGED          Details        gabapentin (NEURONTIN) 100 mg capsule  TAKE 1 CAPSULE BY  MOUTH THREE TIMES A DAY   Qty: 90 Cap, Refills:  5          Comments: This request is for a new prescription for a controlled substance as required by Federal/State  law..   Associated Diagnoses: Neuropathy               omega-3 acid ethyl esters (LOVAZA) 1 gram capsule  TAKE 1 CAPSULE BY MOUTH EVERY DAY               donepeziL (ARICEPT) 5 mg tablet  Take 5 mg by mouth nightly.               lidocaine-prilocaine (EMLA) topical cream  Apply  to affected area as needed for Pain.   Qty: 30 g, Refills:  3          Comments: If able please supply two 30 gram tubes.  thanks   Associated Diagnoses: Paresthesia and pain of right extremity; Pain, arm, right               CHOLECALCIFEROL, VITAMIN D3, PO  Take 5,000 Int'l Units/day by mouth.               buPROPion XL (WELLBUTRIN XL) 300 mg XL tablet  Take 300 mg by mouth daily.   Refills: 2               losartan-hydroCHLOROthiazide (HYZAAR) 100-25 mg per tablet  Take 1 Tab by mouth daily.               escitalopram oxalate (LEXAPRO) 20 mg tablet  Take 20 mg by mouth daily.  minoxidil (ROGAINE) 2 % external solution  Apply  to affected area two (2) times a day. Apply to scalp and rub it in               VAYARIN 75-8.5-21.5 mg cap  Take 1 Cap by mouth two (2) times a day.                     STOP taking these medications                  meloxicam (MOBIC) 15 mg tablet  Comments:    Reason for Stopping:                                NOTIFY YOUR PHYSICIAN FOR ANY OF THE FOLLOWING:    Fever over 101 degrees for 24 hours.    Chest pain, shortness of breath, fever, chills, nausea, vomiting, diarrhea, change in mentation, falling, weakness, bleeding. Severe pain or pain not relieved by medications.   Or, any other signs or symptoms that you may have questions about.      DISPOSITION:         Home With:     OT    PT    HH    RN                   Long term SNF/Inpatient Rehab       Independent/assisted living          Hospice          Other:           PATIENT CONDITION  AT DISCHARGE:       Functional status         Poor        Deconditioned           Independent         Cognition          Lucid        Forgetful           Dementia         Catheters/lines (plus indication)         Foley        PICC        PEG           None         Code status          Full code           DNR         PHYSICAL EXAMINATION AT DISCHARGE:   General:          Alert, cooperative, no distress, appears stated age.      HEENT:           Atraumatic, anicteric sclerae, pink conjunctivae                           No oral ulcers, mucosa moist, throat clear, dentition fair   Neck:               Supple, symmetrical   Lungs:             Clear to auscultation bilaterally.  No Wheezing or Rhonchi. No rales.   Chest wall:      No tenderness  No Accessory muscle use.   Heart:  Regular  rhythm,  No  murmur   No edema   Abdomen:        Soft, non-tender. Not distended.  Bowel sounds normal   Extremities:     No cyanosis.  No clubbing,                             Skin turgor normal, Capillary refill normal   Skin:                Not pale.  Not Jaundiced  No rashes    Psych:             Not anxious or agitated.   Neurologic:      Alert, moves all extremities, answers questions appropriately and responds to commands          CHRONIC MEDICAL DIAGNOSES:      Problem List  as of 11/29/2019  Date Reviewed:  Jan 13, 2019                        Codes  Class  Noted - Resolved             Numbness and tingling  ICD-10-CM: R20.0, R20.2   ICD-9-CM: 782.0    09/07/2014 - Present                       Other chest pain  ICD-10-CM: R07.89   ICD-9-CM: 786.59    08/12/2012 - Present                       MDD (major depressive disorder) (Chronic)  ICD-10-CM: F32.9   ICD-9-CM: 296.20    08/12/2012 - Present                       Elevated liver enzymes  ICD-10-CM: R74.8   ICD-9-CM: 790.5    02/10/2012 - Present                       Anxiety and depression  ICD-10-CM: F41.9, F32.9   ICD-9-CM: 300.00, 311    02/10/2012 - Present                        Chest pain, unspecified  ICD-10-CM: R07.9   ICD-9-CM: 786.50    02/09/2012 - Present                       Headache  ICD-10-CM: R51.9   ICD-9-CM: 784.0    02/09/2012 - Present                       HTN (hypertension)  ICD-10-CM: I10   ICD-9-CM: 401.9    09/07/2014 - Present                       Low back pain  ICD-10-CM: M54.5   ICD-9-CM: 724.2    02/10/2012 - Present                       Dizziness  ICD-10-CM: R42   ICD-9-CM: 780.4    11/29/2019 - Present                       CVA (cerebral vascular accident) (HCC)  ICD-10-CM: I63.9  ICD-9-CM: 434.91    11/29/2019 - Present                       Recurrent depression (HCC)  ICD-10-CM: F33.9   ICD-9-CM: 296.30    10/30/2016 - Present                       Mild neurocognitive disorder  ICD-10-CM: G31.84   ICD-9-CM: 331.83    10/03/2016 - Present          Overview Signed 11/20/2016  7:48 PM by Cristine PolioHaithcock, Roderick E, MD            neropsych eval -De BlanchMary Quig.Ph.D with evidence of contributory psychiatric distress                                   Memory disturbance  ICD-10-CM: R41.3   ICD-9-CM: 780.93    Unknown - Present                       Acute chest pain  ICD-10-CM: R07.9   ICD-9-CM: 786.50    09/06/2014 - Present                       GAD (generalized anxiety disorder) (Chronic)  ICD-10-CM: F41.1   ICD-9-CM: 300.02    01/19/2014 - Present                       Hypertension  ICD-10-CM: I10   ICD-9-CM: 401.9    Unknown - Present                       Hepatic steatosis  ICD-10-CM: K76.0   ICD-9-CM: 571.8    Unknown - Present                       Normal cardiac stress test  ICD-10-CM: IMO0001   ICD-9-CM: IMO0001    02/10/2012 - Present                          Greater than 60 minutes were spent with the patient on counseling and coordination of care      Signed:    Fredrik CoveKyaw T Hans Rusher, MD   11/29/2019   4:10 PM

## 2019-11-29 NOTE — ED Notes (Signed)
Patient to MRI at this time.

## 2019-11-29 NOTE — ED Notes (Signed)
Patient returned from MRI at this time, up to bedside commode. Cardiac leads to be replaced after

## 2019-11-29 NOTE — ED Notes (Signed)
Verbal shift change report given to Sheria Lang RN and Feliz Beam RN (oncoming nurse) by Melina Copa (offgoing nurse). Report included the following information SBAR, Kardex, ED Summary, [facility] and Recent Results.

## 2019-11-29 NOTE — ED Notes (Signed)
Patient to MRI at this time

## 2019-11-29 NOTE — ED Notes (Signed)
Patient returned from MRI at this time, up to bedside commode. Cardiac leads to be replaced after

## 2019-11-29 NOTE — Consults (Signed)
Neuro consult completed. Dictated note to follow.  Pt is a 63yo RH female with MCI, depression, anxiety, somatization, conversion d/o, PTSD, psychotic d/o who tells me she came to the ED today due to high blood pressure. When asked what neurological problem she is having, she states that she cannot use her right arm and has pain down left leg after someone doing a procedure hit a nerve, both very chronic problems. Pt has been seen by multiple neurologists in [place] and has had NCS/EMG of right U/LE 02/01/18 that was completely normal. EEG 02/01/18 normal. MRI C-spine 07/04/19 - with minimal degenerative changes. MRI brain w/wo 11/22/17 with chronic non-specific changes and stable from 04/2013. On this admission, CTH, CTA H/N, MRI brain wo contrast are all unremarkable. Exam is non-focal.  No suspicion for any acute neurological change.  Echo is pending. Stable for d/c from neuro standpoint. Please call with questions.

## 2019-11-29 NOTE — ED Notes (Signed)
Verbal shift change report given to Cameron RN and Travis RN (oncoming nurse) by Sarah RN (offgoing nurse). Report included the following information SBAR, Kardex, ED Summary, MAR and Recent Results.

## 2019-11-29 NOTE — Progress Notes (Signed)
Problem: Self Care Deficits Care Plan (Adult)  Goal: *Acute Goals and Plan of Care (Insert Text)  Description: FUNCTIONAL STATUS PRIOR TO ADMISSION: Patient was independent and active without use of DME.     HOME SUPPORT: The patient lived with husband but did not require assist.    Occupational Therapy Goals  Initiated 11/29/2019  1.  Patient will perform ADLs standing 5 mins without fatigue or LOB with supervision/set-up within 5 day(s).  2.  Patient will perform lower body ADLs with supervision/set-up within 5 day(s).  3.  Patient will perform gathering ADL items high and low 2/2 with supervision/set-up within 5 day(s).  4.  Patient will perform toilet transfers with supervision/set-up within 5 day(s).  5.  Patient will perform all aspects of toileting with supervision/set-up within 5 day(s).  6.  Patient will participate in upper extremity therapeutic exercise/activities to increase independence with ADLs with supervision/set-up for 5 minutes within 5 day(s).      Outcome: Not Met    OCCUPATIONAL THERAPY EVALUATION  Patient: Vanessa [name] (64 y.o. female)  Date: 11/29/2019  Primary Diagnosis: CVA (cerebral vascular accident) [facility]) [I63.9]  Dizziness [R42]        Precautions:  Fall    ASSESSMENT  Based on the objective data described below, the patient presents with near baseline ADL performance s/p admission for CVA and dizziness. Imaging negative for acute intercranial process. Patient ADLs limited by pain in LUE, impaired balance and generalized weakness. Patient lives in 2 level home with husband and was IND/mod I at baseline.      Today, patient completed bed mobility with supervision and functional mobility with up to CGA. Patient able to donn jacket and socks/shoes with supervision. Patient returned to supine at end of session with call bell in reach, RN aware, all needs met. Will continue to follow for 1-2 more sessions to address safety - patient with 3 falls recently. Anticipate no follow-up needs once medically cleared for D/C.      Current Level of Function Impacting Discharge (ADLs/self-care): supervision-CGA    Functional Outcome Measure:  The patient scored 60/100 on the Barthel Index outcome measure which is indicative of mild/moderate deficits in ADLs.      Other factors to consider for discharge: none - has good support and all needed DME     Patient will benefit from skilled therapy intervention to address the above noted impairments.       PLAN :  Recommendations and Planned Interventions: self care training, functional mobility training, therapeutic exercise, balance training, therapeutic activities, endurance activities, patient education, home safety training, and family training/education    Frequency/Duration: Patient will be followed by occupational therapy 5 times a week to address goals.    Recommendation for discharge: (in order for the patient to meet his/her long term goals)  No skilled occupational therapy/ follow up rehabilitation needs identified at this time.    This discharge recommendation:  Has not yet been discussed the attending provider and/or case management    IF patient discharges home will need the following DME: patient owns DME required for discharge       SUBJECTIVE:   Patient stated ???My husband bought me this coat for Christmas.???    OBJECTIVE DATA SUMMARY:   HISTORY:   Past Medical History:   Diagnosis Date    Depression     H/O colonoscopy 09-24-12    diverticulosis    Hepatic steatosis     Hypertension     Mild neurocognitive  disorder 10/03/2016     neropsych eval -Donavan Foil.Ph.D with evidence of contributory psychiatric distress    Normal cardiac stress test 02-10-12    Pneumonia      Past Surgical History:   Procedure Laterality Date    HX GYN      tubal ligation       Expanded or extensive additional review of patient history:     Home Situation  Home Environment: Private residence  # Steps to Enter: 3(3 falls on steps last year)  Rails to Enter: Yes  Hand Rails : Left  One/Two Story Residence: Two story, live on 1st floor  Living Alone: No  Support Systems: Copy  Current DME Used/Available at Home: None  Tub or Shower Type: Tub/Shower combination    Hand dominance: Right    EXAMINATION OF PERFORMANCE DEFICITS:  Cognitive/Behavioral Status:  Neurologic State: Alert  Orientation Level: Oriented X4  Cognition: Appropriate for age attention/concentration;Follows commands  Perception: Appears intact  Perseveration: No perseveration noted  Safety/Judgement: Awareness of environment;Fall prevention    Skin: appears intact    Edema: none noted in BUEs    Hearing:       Vision/Perceptual:    Tracking: Able to track stimulus in all quadrants w/o difficulty    Diplopia: No    Acuity: Within Defined Limits    Corrective Lenses: Glasses    Range of Motion:  In BUEs   AROM: Generally decreased, functional    Strength:  In BUEs  Strength: Generally decreased, functional    Coordination:  Coordination: Within functional limits  Fine Motor Skills-Upper: Left Intact;Right Intact    Gross Motor Skills-Upper: Left Intact;Right Intact    Tone & Sensation:  In BUEs  Tone: Normal  Sensation: Impaired(LUE tingling/painful)     Balance:  Sitting: Intact  Standing: Impaired;Without support  Standing - Static: Fair  Standing - Dynamic : Fair    Functional Mobility and Transfers for ADLs:  Bed Mobility:  Supine to Sit: Contact guard assistance  Sit to Supine: Contact guard assistance    Transfers:  Sit to Stand: Contact guard assistance(ED plinth)   Stand to Sit: Contact guard assistance(ED plinth)    ADL Assessment:  Feeding: Independent    Oral Facial Hygiene/Grooming: Independent    Bathing: Supervision    Upper Body Dressing: Independent    Lower Body Dressing: Supervision    Toileting: Supervision     ADL Intervention and task modifications:    Upper Body Dressing Assistance  Front Opened Shirt: Independent    Lower Body Dressing Assistance  Socks: Supervision  Shoes with Cloth Laces: Supervision  Leg Crossed Method Used: Yes  Position Performed: Seated edge of bed  Cues: Don    Cognitive Retraining  Safety/Judgement: Awareness of environment;Fall prevention    Functional Measure:  Barthel Index:    Bathing: 0  Bladder: 10  Bowels: 10  Grooming: 5  Dressing: 10  Feeding: 10  Mobility: 0  Stairs: 0  Toilet Use: 5  Transfer (Bed to Chair and Back): 10  Total: 60/100        The Barthel ADL Index: Guidelines  1. The index should be used as a record of what a patient does, not as a record of what a patient could do.  2. The main aim is to establish degree of independence from any help, physical or verbal, however minor and for whatever reason.  3. The need for supervision renders the patient not independent.  4. A patient's  performance should be established using the best available evidence. Asking the patient, friends/relatives and nurses are the usual sources, but direct observation and common sense are also important. However direct testing is not needed.  5. Usually the patient's performance over the preceding 24-48 hours is important, but occasionally longer periods will be relevant.  6. Middle categories imply that the patient supplies over 50 per cent of the effort.  7. Use of aids to be independent is allowed.    Daneen Schick., Barthel, D.W. [phone]). Functional evaluation: the Barthel Index. Faith (14)2.   Lucianne Lei der [place], J.J.M.F, [place], Diona Browner., Oris Drone., [place], [place] (1999). Measuring the change indisability after inpatient rehabilitation; comparison of the responsiveness of the Barthel Index and Functional Independence Measure. Journal of Neurology, Neurosurgery, and Psychiatry, 66(4), [phone].  Wilford Sports, N.J.A, [place],  W.J.M, & Koop[place]chap, M.A. (2004.) Assessment of post-stroke quality of life in cost-effectiveness studies: The usefulness of the Barthel Index and the EuroQoL-5D. Quality of Life Research, 72, [phone]       Occupational Therapy Evaluation Charge Determination   History Examination Decision-Making   LOW Complexity : Brief history review  LOW Complexity : 1-3 performance deficits relating to physical, cognitive , or psychosocial skils that result in activity limitations and / or participation restrictions  LOW Complexity : No comorbidities that affect functional and no verbal or physical assistance needed to complete eval tasks       Based on the above components, the patient evaluation is determined to be of the following complexity level: LOW   Pain Rating:  Reporting 10/10 pain in LUE    Activity Tolerance:   Good and requires rest breaks    After treatment patient left in no apparent distress:    Supine in bed, Call bell within reach, Side rails x 3, and RN aware    COMMUNICATION/EDUCATION:   The patient???s plan of care was discussed with: Physical therapist and Registered nurse.     Home safety education was provided and the patient/caregiver indicated understanding. and Patient/family have participated as able in goal setting and plan of care.    This patient???s plan of care is appropriate for delegation to OTA.    Thank you for this referral.  Fidela Salisbury, OT  Time Calculation: 29 mins

## 2019-11-29 NOTE — Consults (Signed)
[facility]    Name:  Vanessa [name], Vanessa [name]  MR#:  [id number]  DOB:  [birth year]  ACCOUNT #:  [id number]  DATE OF SERVICE:  11/29/2019    NEUROLOGY CONSULTATION    HISTORY OF PRESENT ILLNESS:  This is a 64-year right-handed female who was admitted yesterday on 11/28/2019, she states for elevated blood pressure at home of 146/102.  After arrival, she had multiple complaints including headache, chest pain, blurry vision and tells me she cannot use her right arm, however, on review of the chart, this is a chronic complaint for which she has seen Dr. Dolores Lory recently 12/2018.  She had a normal workup which included an EMG, EEG, carotid Dopplers.  Her blood pressure on presentation here was 166/108.  She has undergone CT of the head, CTA of the head and neck, and MRI of the brain which are all unremarkable.  She only has mild chronic ischemic white matter disease.  The patient had an MRI of her cervical spine on 07/04/2019 with minimal degenerative changes seen and multiple MRIs of the cervical spine done in the past.  The patient also mentions pain down her left leg due to someone hitting a nerve during some procedure, this is also a chronic complaint.  The patient has extensive psych history which includes somatization, evaluated by Dr. Novella Rob in Neuropsychology in the past.  The patient is not on antiplatelet agent at home or oral anticoagulation.  No history of stroke.  No diabetes.  She does have hyperlipidemia but is not on a statin and does have hypertension.  No smoking.    PAST MEDICAL HISTORY:  1.  Mild cognitive impairment.  2.  Depression.  3.  Anxiety.  5.  Somatization.  6.  Conversion disorder.  7.  PTSD.  8.  Psychotic disorder.  9.  Diverticulosis.  10.  Hypertension.  11.  Hyperlipidemia.  12.  Tubal ligation.    REVIEW OF SYSTEMS:  As per past medical history or HPI, otherwise, reviewed and negative.    MEDICATIONS:  1. Aricept.  2.  Neurontin.  3.  Vitamin D.  4.  Mobic.   5.  Wellbutrin.  6.  Hyzaar.  7.  Lexapro.  8.  Rogaine.    ALLERGIES:  AMLODIPINE, LOSARTAN, NIFEDIPINE.    SOCIAL HISTORY:  She is married.  No tobacco, alcohol or drug use.  She is on disability for her memory and PTSD.    FAMILY HISTORY:  Mom and dad both had heart disease.    PHYSICAL EXAMINATION:  VITAL SIGNS:  Blood pressure 127/94, pulse 69, respiratory rate 14, satting 97% on room air, temperature is 97.8, BMI of 25.  GENERAL:  She is well-nourished, well-developed, healthy-appearing, lying in bed in no distress.  HEART:  Regular rate and rhythm without murmurs.  Carotids are 2+.  No bruits.  EXTREMITIES:  Warm with no edema.  She has 2+ radial pulses.  NEUROLOGIC:  Mental Status:  Alert and oriented x4.  Speech and language intact.  Attention, memory and fund of knowledge appropriate.  Cranial nerve examination shows no facial asymmetry or ptosis.  Her extraocular movements are intact without diplopia or nystagmus.  Her visual fields are full.  Pupils are equally round and reactive.  Her tongue midline.  Her palate elevates symmetrically.  Trapezius and sternocleidomastoid are 5/5.  Strength, sensation, hearing intact.  Motor exam is 5/5 throughout.  No pronator drift.  No tremor.  Sensory exam intact to light touch  and pinprick throughout.  Reflexes are symmetric.  Toes downgoing.  Coordination:  Intact to finger-to-nose, rapid alternating movements.  Gait not assessed at this time.    STUDIES AND REPORTS:  Reviewed above.  In addition, CBC, B12, CMP, magnesium, phosphorus, folate, TSH, all unremarkable.     ASSESSMENT AND PLAN:  This is a 64 year old right-handed female with mild cognitive impairment, depression, anxiety, somatization, conversion disorder, post-traumatic stress disorder, psychotic disorder, who came to the emergency department because of her high blood pressure and reports her only neurological issue is her chronic difficulties using her right arm and chronic pain down her left leg that she attributes to someone injuring a nerve during the procedure, both again chronic with previous evaluations including NCS/EMG study of the right arm and leg, EEG, MRI of the cervical spine, and MRI of the brain in the past that were all unremarkable and on this admission, a CT of the head, CTA of the head and neck and MRI of the brain that are all unremarkable.  Her exam is nonfocal.  I have no suspicion for any acute neurological change.  She is stable for discharge from a neuro standpoint.      Jodi Mourning, MD      [id number]  D:  11/30/2019 8:02  T:  11/30/2019 11:10  JOB #:  [id number]

## 2019-11-29 NOTE — H&P (Signed)
History & Physical    Primary Care Provider: Electa Sniff, MD  Source of Information: Patient and chart review    History of Presenting Illness:   Vanessa [name] is a 64 y.o. 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  Patient states over the last week, she has noted increased dizziness which is progressed  and to an unbalanced gait over the last 3 days.  Patient endorses about 3 episodes of falls this week and states at one point she fell down her stairs.  She denies hitting her head, suffering any injuries or have any loss of consciousness.  She endorses mild intermittent headache.  Also endorses bilateral lower extremity weakness.  She denies any vision changes, dysphagia, hearing loss, tinnitus or odynophagia.  She has had no recent travel or sick contacts.  Additionally, patient also complains of chest pain for the last week.  She complains of an aching, intermittent, 7/10, left-sided chest pain that occurs both at rest and with activity.  She denies any aggravating or relieving factors.  Patient states she has had similar pain in the past and has had cardiac work-up several years ago which she states was unremarkable.  Admits to increasingly high BPs at home with systolic BPs in 638V prior to ER presentation.    The patient denies any fever, chills, chest pain, cough, congestion, recent illness, palpitations, or dysuria.    On ER presentation, vital signs were remarkable for blood pressures of 160s over 108.  Chest x-ray showed no acute process.  CT head and CTA head and neck were unremarkable.     Review of Systems:  A comprehensive review of systems was negative except for that written in the History of Present Illness.     Past Medical History:   Diagnosis Date   ??? Depression     ??? H/O colonoscopy 09-24-12    diverticulosis   ??? Hepatic steatosis    ??? Hypertension    ??? Mild neurocognitive disorder 10/03/2016    neropsych eval -Donavan Foil.Ph.D with evidence of contributory psychiatric distress   ??? Normal cardiac stress test 02-10-12   ??? Pneumonia       Past Surgical History:   Procedure Laterality Date   ??? HX GYN      tubal ligation     Prior to Admission medications    Medication Sig Start Date End Date Taking? Authorizing Provider   gabapentin (NEURONTIN) 100 mg capsule TAKE 1 CAPSULE BY MOUTH THREE TIMES A DAY 01/13/19   O'Laughlin, Joycelyn Das, NP   omega-3 acid ethyl esters (LOVAZA) 1 gram capsule TAKE 1 CAPSULE BY MOUTH EVERY DAY 11/25/18   Provider, Historical   donepeziL (ARICEPT) 5 mg tablet Take 5 mg by mouth nightly. 11/29/18   Provider, Historical   lidocaine-prilocaine (EMLA) topical cream Apply  to affected area as needed for Pain. 06/05/18   Epps, Arlyss Repress, MD   CHOLECALCIFEROL, VITAMIN D3, PO Take 5,000 Int'l Units/day by mouth.    Provider, Historical   meloxicam (MOBIC) 15 mg tablet Take 15 mg by mouth. 03/04/17   Provider, Historical   buPROPion XL (WELLBUTRIN XL) 300 mg XL tablet Take 300 mg by mouth daily. 01/14/18   Provider, Historical   losartan-hydroCHLOROthiazide (HYZAAR) 100-25 mg per tablet Take 1 Tab by mouth daily.    Provider, Historical   escitalopram oxalate (LEXAPRO) 20 mg tablet  Take 20 mg by mouth daily.    Provider, Historical   minoxidil (ROGAINE) 2 % external solution Apply  to affected area two (2) times a day. Apply to scalp and rub it in    Provider, Historical   VAYARIN 75-8.5-21.5 mg cap Take 1 Cap by mouth two (2) times a day. 05/29/14   Provider, Historical     Allergies   Allergen Reactions   ??? Amlodipine Other (comments)     Loss of memory,nervous,jettery   ??? Losartan Other (comments)     Nervous,jettery   ??? Nifedipine Other (comments)     Swelling,palpations      Family History   Problem Relation Age of Onset   ??? Heart Disease Mother    ??? Heart Disease Father          SOCIAL HISTORY:  Patient resides:  Independently x   Assisted Living    SNF    With family care       Smoking history:   None x   Former    Chronic      Alcohol history:   None x   Social    Chronic      Ambulates:   Independently x   w/cane    w/walker    w/wc    CODE STATUS:  DNR    Full x   Other      Objective:     Physical Exam:     Visit Vitals  BP (!) 133/92 (BP 1 Location: Right arm, BP Patient Position: At rest)   Pulse 62   Temp 98.9 ??F (37.2 ??C)   Resp 16   Ht 5' 7" (1.702 m)   Wt 73.9 kg (163 lb)   SpO2 93%   BMI 25.53 kg/m??      O2 Device: Room air    General:  Alert, cooperative, no distress, appears stated age.   Head:  Normocephalic, without obvious abnormality, atraumatic.   Eyes:  Conjunctivae/corneas clear. PERRL, EOMs intact.   Nose: Nares normal. Septum midline. Mucosa normal. No drainage or sinus tenderness.   Throat: Lips, mucosa, and tongue normal. Teeth and gums normal.   Neck: Supple, symmetrical, trachea midline, no adenopathy, thyroid: no enlargement/tenderness/nodules, no carotid bruit and no JVD.   Back:   Symmetric, no curvature. ROM normal. No CVA tenderness.   Lungs:   Clear to auscultation bilaterally.   Chest wall:  No tenderness or deformity.   Heart:  Regular rate and rhythm, S1, S2 normal, no murmur, click, rub or gallop.   Abdomen:   Soft, non-tender. Bowel sounds normal. No masses,  No organomegaly.   Extremities: Extremities normal, atraumatic, no cyanosis or edema.   Pulses: 2+ and symmetric all extremities.   Skin: Skin color, texture, turgor normal. No rashes or lesions   Neurologic: CNII-XII intact.  5/5 strength in bilateral upper and lower extremities.  No dysmetria.  No DDDK.  Gait not tested.         EKG:  normal EKG, normal sinus rhythm, unchanged from previous tracings.  Data Review:     Recent Days:  Recent Labs     11/28/19  2116   WBC 6.6   HGB 13.6   HCT 40.3   PLT 373     Recent Labs     11/28/19  2116   NA 138   K 3.5   CL 106   CO2 28   GLU 98   BUN 17  CREA 1.02   CA 9.6   MG 2.5*   PHOS 3.6   ALB 4.0   ALT 27     No results for input(s): PH, PCO2, PO2, HCO3, FIO2 in the last 72 hours.    24 Hour Results:  Recent Results (from the past 24 hour(s))   EKG, 12 LEAD, INITIAL    Collection Time: 11/28/19  9:10 PM   Result Value Ref Range    Ventricular Rate 69 BPM    Atrial Rate 69 BPM    P-R Interval 152 ms    QRS Duration 82 ms    Q-T Interval 378 ms    QTC Calculation (Bezet) 405 ms    Calculated P Axis 76 degrees    Calculated R Axis 37 degrees    Calculated T Axis 46 degrees    Diagnosis       Normal sinus rhythm  Nonspecific T wave abnormality  When compared with ECG of 06-Sep-2014 16:55,  QT has lengthened     SAMPLES BEING HELD    Collection Time: 11/28/19  9:16 PM   Result Value Ref Range    SAMPLES BEING HELD 1 RED, 1 BLU     COMMENT        Add-on orders for these samples will be processed based on acceptable specimen integrity and analyte stability, which may vary by analyte.   CBC WITH AUTOMATED DIFF    Collection Time: 11/28/19  9:16 PM   Result Value Ref Range    WBC 6.6 3.6 - 11.0 K/uL    RBC 4.28 3.80 - 5.20 M/uL    HGB 13.6 11.5 - 16.0 g/dL    HCT 40.3 35.0 - 47.0 %    MCV 94.2 80.0 - 99.0 FL    MCH 31.8 26.0 - 34.0 PG    MCHC 33.7 30.0 - 36.5 g/dL    RDW 13.5 11.5 - 14.5 %    PLATELET 373 150 - 400 K/uL    MPV 9.0 8.9 - 12.9 FL    NRBC 0.0 0 PER 100 WBC    ABSOLUTE NRBC 0.00 0.00 - 0.01 K/uL    NEUTROPHILS 54 32 - 75 %    LYMPHOCYTES 37 12 - 49 %    MONOCYTES 4 (L) 5 - 13 %    EOSINOPHILS 4 0 - 7 %    BASOPHILS 1 0 - 1 %    IMMATURE GRANULOCYTES 0 0.0 - 0.5 %    ABS. NEUTROPHILS 3.6 1.8 - 8.0 K/UL    ABS. LYMPHOCYTES 2.4 0.8 - 3.5 K/UL    ABS. MONOCYTES 0.3 0.0 - 1.0 K/UL    ABS. EOSINOPHILS 0.2 0.0 - 0.4 K/UL    ABS. BASOPHILS 0.1 0.0 - 0.1 K/UL    ABS. IMM. GRANS. 0.0 0.00 - 0.04 K/UL    DF AUTOMATED     METABOLIC PANEL, COMPREHENSIVE    Collection Time: 11/28/19  9:16 PM   Result Value Ref Range    Sodium 138 136 - 145 mmol/L     Potassium 3.5 3.5 - 5.1 mmol/L    Chloride 106 97 - 108 mmol/L    CO2 28 21 - 32 mmol/L    Anion gap 4 (L) 5 - 15 mmol/L    Glucose 98 65 - 100 mg/dL    BUN 17 6 - 20 MG/DL    Creatinine 1.02 0.55 - 1.02 MG/DL    BUN/Creatinine ratio 17 12 - 20  GFR est AA >60 >60 ml/min/1.61m    GFR est non-AA 55 (L) >60 ml/min/1.766m   Calcium 9.6 8.5 - 10.1 MG/DL    Bilirubin, total 0.2 0.2 - 1.0 MG/DL    ALT (SGPT) 27 12 - 78 U/L    AST (SGOT) 20 15 - 37 U/L    Alk. phosphatase 69 45 - 117 U/L    Protein, total 7.6 6.4 - 8.2 g/dL    Albumin 4.0 3.5 - 5.0 g/dL    Globulin 3.6 2.0 - 4.0 g/dL    A-G Ratio 1.1 1.1 - 2.2     TROPONIN I    Collection Time: 11/28/19  9:16 PM   Result Value Ref Range    Troponin-I, Qt. <0.05 <0.05 ng/mL   TROPONIN I    Collection Time: 11/28/19  9:16 PM   Result Value Ref Range    Troponin-I, Qt. <0.05 <0.05 ng/mL   MAGNESIUM    Collection Time: 11/28/19  9:16 PM   Result Value Ref Range    Magnesium 2.5 (H) 1.6 - 2.4 mg/dL   PHOSPHORUS    Collection Time: 11/28/19  9:16 PM   Result Value Ref Range    Phosphorus 3.6 2.6 - 4.7 MG/DL   FOLATE    Collection Time: 11/29/19  3:17 AM   Result Value Ref Range    Folate 17.3 5.0 - 21.0 ng/mL   TSH 3RD GENERATION    Collection Time: 11/29/19  3:17 AM   Result Value Ref Range    TSH 2.11 0.36 - 3.74 uIU/mL   TROPONIN I    Collection Time: 11/29/19  3:17 AM   Result Value Ref Range    Troponin-I, Qt. <0.05 <0.05 ng/mL   VITAMIN B12    Collection Time: 11/29/19  3:17 AM   Result Value Ref Range    Vitamin B12 1,347 (H) 193 - 986 pg/mL         Imaging:     Assessment:     AnTRISTEN PENNINOs a [age].o. female with past medical history of major depressive disorder, neurocognitive deficits/transplantable disconnect hypertension who is admitted for CVA rule out and atypical angina.       Plan:        CVA r/o  -Patient with complaints of dizziness and ataxia for over 1 week  -CT head, CTA head and neck unremarkable  -MRI brain without contrast   - TTE with bubble study   - Lipid panel, TSH, ESR/CRP,  serial troponins   - Check A1C, fingersticks and ISS in interim - avoid hypoglycemia   - Neuro checks and vital signs q4h   - Bedside swallow evaluation per nursing   - Observe on telemetry   - PT/OT, orthostatic vitals  - Fall precautions   - Neurology following, appreciate recommendations     Atypical Angina  -Chart review shows normal stress test in 2015  -Initial troponin negative, EKG visit without any acute ischemic changes  -Continue serial troponins  -Will likely need stress testing after CVA work-up  -Follow-up TTE as part of CVA work-up    Major depressive disorder  -Continue Wellbutrin and Lexapro    Neurocognitive deficits/transplantable disconnect   -Followed by psychiatrist Dr. SePhilippa Sicks-Continue Aricept    Hypertension  -Continue losartan and HCTZ                    FEN/GI -  NS @ 75 ml/hr  Activity - As tolerated  DVT prophylaxis - Lovenox  GI prophylaxis -  NI  Disposition - TBD    CODE STATUS:  Full code       Signed By: Marguarite Arbour, MD     November 29, 2019

## 2019-11-29 NOTE — Progress Notes (Signed)
Problem: Mobility Impaired (Adult and Pediatric)  Goal: Interventions  Description: FUNCTIONAL STATUS PRIOR TO ADMISSION: Patient was independent and active without use of DME.  Has chronic LLE and RUE pain.      HOME SUPPORT PRIOR TO ADMISSION: The patient lived with spouse.    Physical Therapy Goals  Initiated 11/29/2019  1.  Patient will move from supine to sit and sit to supine , scoot up and down, and roll side to side in bed with modified independence within 7 day(s).    2.  Patient will transfer from bed to chair and chair to bed with modified independence using the least restrictive device within 7 day(s).  3.  Patient will perform sit to stand with modified independence within 7 day(s).  4.  Patient will ambulate with independence for 150 feet with the least restrictive device within 7 day(s).   5.  Patient will ascend/descend 4 stairs with handrail(s) with modified independence within 7 day(s).          Outcome: Not Met   PHYSICAL THERAPY EVALUATION  Patient: Vanessa [name] (64 y.o. female)  Date: 11/29/2019  Primary Diagnosis: CVA (cerebral vascular accident) [facility]) [I63.9]  Dizziness [R42]        Precautions:  Fall    ASSESSMENT   Based on the objective data described below, the patient presents with impaired functional mobility s/p admission for dizziness and CVA.  MRI shows no acute infarct.  Functional mobility is limited by light headedness with position changes (pt describes as dizziness though not room spinning), chronic pain (reported to be worse today), balance deficit, decreased tolerance to activity.   At baseline, patient is independent w/ transfers and ambulating with no device.  Today, she required CGA for bed mobility and sit<->stand and min assist to ambulate with no device.  Gait is most limited by pain, c/o light headedness on initial stance, and not donning shoes.  Noted improvement in gait quality and balance when ambulating w/ shoes donned though continued to require CGA.  BP was assessed sup/sit/stand as documented below.   Therapy will follow patient to address impairments.  Anticipate return home with family and no therapy follow up vs HHPT.    Vitals:    11/29/19 0907 11/29/19 0908 11/29/19 0914   BP: (!) 142/92 (!) 137/96 (!) 168/98   BP 1 Location: Left arm Left arm Left arm   BP Patient Position: Supine Sitting Standing   Pulse: (!) 59 68 69   SpO2: 100%           Current Level of Function Impacting Discharge (mobility/balance): CGA for bed mobility and sit<->stand from plinth, Min assist to ambulate w/ no device    Functional Outcome Measure:  The patient scored 11/28 on the Tinetti outcome measure which is indicative of high fall risk.      Other factors to consider for discharge: PLOF indep, chronic pain, lives w/ spouse     Patient will benefit from skilled therapy intervention to address the above noted impairments.       PLAN :  Recommendations and Planned Interventions: bed mobility training, transfer training, gait training, therapeutic exercises, patient and family training/education, and therapeutic activities       Frequency/Duration: Patient will be followed by physical therapy:  5 times a week to address goals.    Recommendation for discharge: (in order for the patient to meet his/her long term goals)  To be determined: Anticipate home with no therapy follow up vs HHPT  pending progress.    This discharge recommendation:  Has not yet been discussed the attending provider and/or case management    IF patient discharges home will need the following DME: to be determined (TBD) - anticipate none         SUBJECTIVE:   Patient stated "Do you have any CBD. They [OP] gave me that to use on my arm pain???    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/03/2016    neropsych eval -Donavan Foil.Ph.D with evidence of contributory psychiatric distress    Normal cardiac stress test 02-10-12    Pneumonia      Past Surgical History:   Procedure Laterality Date    HX GYN      tubal ligation       Personal factors and/or comorbidities impacting plan of care: PMH    Home Situation  Home Environment: Private residence  # Steps to Enter: 3(3 falls on steps last year)  Rails to Enter: Yes  Hand Rails : Left  One/Two Story Residence: Two story, live on 1st floor  Living Alone: No(l/w spouse)  Support Systems: Copy  Current DME Used/Available at Home: None  Tub or Shower Type: Tub/Shower combination    EXAMINATION/PRESENTATION/DECISION MAKING:   Critical Behavior:  Neurologic State: Alert  Orientation Level: Oriented X4  Cognition: Follows commands, Appropriate decision making, Appropriate for age attention/concentration     Hearing:  Intact  Skin:  Appears intact  Edema: None  Range Of Motion:  AROM: Generally decreased, functional                       Strength:    Strength: Generally decreased, functional                    Tone & Sensation:   Tone: Normal               Sensation: Impaired(LUE tingling)               Coordination:  Coordination: Within functional limits  Vision:   Diplopia: No  Reports blurred vision  Corrective Lenses: Glasses  Functional Mobility:  Bed Mobility:  Supine to Sit: Contact guard assistance  Sit to Supine: Contact guard assistance  Transfers:  Sit to Stand: Contact guard assistance(ED plinth)  Stand to Sit: Contact guard assistance(ED plinth)  Balance:   Sitting: Intact;Without support  Standing: Impaired;Without support  Standing - Static: Fair  Standing - Dynamic : Fair  Ambulation/Gait Training:  Distance (ft): 30 Feet  (amb again 20 ft with shoes donned w/ improvement in gait quality and balance)  Assistive Device: Gait belt  Ambulation - Level of Assistance: Contact guard assistance;Minimal assistance;Assist x1  Gait Description (WDL): Exceptions to WDL  Gait Abnormalities: Decreased step clearance;Altered arm swing;Step to gait  Stance: Left decreased  Speed/Cadence: Slow  Step Length: Right shortened;Left shortened  Recommended wearing shoe if they offer arch support and assist in ambulation.       Functional Measure:  Tinetti test:    Sitting Balance: 1  Arises: 1  Attempts to Rise: 1  Immediate Standing Balance: 0  Standing Balance: 1  Nudged: 0  Eyes Closed: 0  Turn 360 Degrees - Continuous/Discontinuous: 0  Turn 360 Degrees - Steady/Unsteady: 1  Sitting Down: 1  Balance Score: 6 Balance total score  Indication of Gait: 0  R Step Length/Height: 0  L Step Length/Height: 1  R Foot Clearance: 1  L Foot Clearance: 1  Step Symmetry: 0  Step Continuity: 0  Path: 1  Trunk: 0(HHA)  Walking Time: 1  Gait Score: 5 Gait total score  Total Score: 11/28 Overall total score         Tinetti Tool Score Risk of Falls  <19 = High Fall Risk  19-24 = Moderate Fall Risk  25-28 = Low Fall Risk  Tinetti ME. Performance-Oriented Assessment of Mobility Problems in Elderly Patients. [place]; [id number]. (Scoring Description: PT Bulletin Feb. 10, 1993)     Older adults: Letta Moynahan et al, 2009; n = [place] elderly evaluated with ABC, POMA, ADL, and IADL)  ?? Mean POMA score for males aged 17-79 years = 26.21(3.40)  ?? Mean POMA score for females age 56-79 years = 25.16(4.30)  ?? Mean POMA score for males over 80 years = 23.29(6.02)  ?? Mean POMA score for females over 80 years = 17.20(8.32)            Physical Therapy Evaluation Charge Determination   History Examination Presentation Decision-Making   MEDIUM  Complexity : 1-2 comorbidities / personal factors will impact the outcome/ POC  LOW Complexity : 1-2 Standardized tests and measures addressing body structure, function, activity limitation and / or participation in recreation  LOW Complexity : Stable, uncomplicated  LOW Complexity : FOTO score of 75-100      Based on the above components, the patient evaluation is determined to be of the following complexity level: LOW     Pain Rating:  Voiced 10/10 RUE and LLE pain.      Activity Tolerance:   Fair, requires rest breaks, and pain    After treatment patient left in no apparent distress:   Supine in bed, Call bell within reach, and Side rails x 3    COMMUNICATION/EDUCATION:   The patient???s plan of care was discussed with: Occupational therapist and Registered nurse.     Fall prevention education was provided and the patient/caregiver indicated understanding., Patient/family have participated as able in goal setting and plan of care., and Patient/family agree to work toward stated goals and plan of care.    Thank you for this referral.  Arnette Felts, PT   Time Calculation: 28 mins

## 2019-11-29 NOTE — Other (Signed)
Orders received, chart reviewed and patient evaluated by occupational therapy. Pending progression with skilled acute occupational therapy, recommend:  No skilled occupational therapy/ follow up rehabilitation needs identified at this time.     Recommend with nursing patient to complete as able in order to maintain strength, endurance and independence: OOB to chair 3x/day with SBA and functional mobility to the bathroom/BSC with SBA. Thank you for your assistance.     Full evaluation to follow.

## 2019-11-29 NOTE — ED Notes (Signed)
Pt returned from Echo.  Pt placed back on monitor and provided with lunch.  Call bell within reach.

## 2019-11-29 NOTE — Discharge Summary (Signed)
Discharge Summary       PATIENT ID: Vanessa [name]  MRN: [id number]   DATE OF BIRTH: 1956-01-03    DATE OF ADMISSION: 11/28/2019 10:15 PM    DATE OF DISCHARGE: 11/29/2019   PRIMARY CARE PROVIDER: Katherina Right, MD     ATTENDING PHYSICIAN: Mertha Finders  DISCHARGING PROVIDER: Fredrik Cove, MD    To contact this individual call [phone] and ask the operator to page.  If unavailable ask to be transferred the Adult Hospitalist Department.    CONSULTATIONS: IP CONSULT TO NEUROLOGY    PROCEDURES/SURGERIES: * No surgery found *    ADMITTING DIAGNOSES & HOSPITAL COURSE:     HPI  Vanessa [name] is a 64 y.o. 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  Patient states over the last week, she has noted increased dizziness which is progressed  and to an unbalanced gait over the last 3 days.  Patient endorses about 3 episodes of falls this week and states at one point she fell down her stairs.  She denies hitting her head, suffering any injuries or have any loss of consciousness.  She endorses mild intermittent headache.  Also endorses bilateral lower extremity weakness.  She denies any vision changes, dysphagia, hearing loss, tinnitus or odynophagia.  She has had no recent travel or sick contacts.  Additionally, patient also complains of chest pain for the last week.  She complains of an aching, intermittent, 7/10, left-sided chest pain that occurs both at rest and with activity.  She denies any aggravating or relieving factors.  Patient states she has had similar pain in the past and has had cardiac work-up several years ago which she states was unremarkable.  Admits to increasingly high BPs at home with systolic BPs in 170s prior to ER presentation.    Hospital Course   Patient presents with dizziness. Work up with MRI of the brain shows no acute infarction. Noted mild chronic white matter disease. Echo with normal EF and mild (grade 1) diastolic dysfunction. Also patient with uncontrolled HTN, takes Losartan-HCTZ at home. Added hydralazine during this admission. Recommended ambulatory BP check and follow up with PCP for reevaluation of BP. PT evaluated the patient and no weakness was noted.     DISCHARGE DIAGNOSES / PLAN:      1. Dizziness  2. Uncontrolled hypertension  3. Depression  4. Psychiatric disorder     ADDITIONAL CARE RECOMMENDATIONS:     Recommend ambulatory BP check once or twice a day  Follow up with PCP in one week for reevaluation of HTN     PENDING TEST RESULTS:   At the time of discharge the following test results are still pending: None    FOLLOW UP APPOINTMENTS:    Follow-up Information     Follow up With Specialties Details Why Contact Info    Vanessa Right, MD Family Medicine In 1 week  [address]  [address]  [place] [address]  [phone]               DIET: Cardiac Diet  Oral Nutritional Supplements: No Oral Supplement prescribed    ACTIVITY: Recommend against strenuous activities for next 2-3 days    WOUND CARE: None    EQUIPMENT needed: None      DISCHARGE MEDICATIONS:  Current Discharge Medication List      START taking  these medications    Details   hydrALAZINE (APRESOLINE) 10 mg tablet Take 1 Tab by mouth three (3) times daily.  Qty: 90 Tab, Refills: 0         CONTINUE these medications which have NOT CHANGED    Details   gabapentin (NEURONTIN) 100 mg capsule TAKE 1 CAPSULE BY MOUTH THREE TIMES A DAY  Qty: 90 Cap, Refills: 5    Comments: This request is for a new prescription for a controlled substance as required by Federal/State law..  Associated Diagnoses: Neuropathy      omega-3 acid ethyl esters (LOVAZA) 1 gram capsule TAKE 1 CAPSULE BY MOUTH EVERY DAY      donepeziL (ARICEPT) 5 mg tablet Take 5 mg by mouth nightly.       lidocaine-prilocaine (EMLA) topical cream Apply  to affected area as needed for Pain.  Qty: 30 g, Refills: 3    Comments: If able please supply two 30 gram tubes.  thanks  Associated Diagnoses: Paresthesia and pain of [name] extremity; Pain, arm, [name]      CHOLECALCIFEROL, VITAMIN D3, PO Take 5,000 Int'l Units/day by mouth.      buPROPion XL (WELLBUTRIN XL) 300 mg XL tablet Take 300 mg by mouth daily.  Refills: 2      losartan-hydroCHLOROthiazide (HYZAAR) 100-25 mg per tablet Take 1 Tab by mouth daily.      escitalopram oxalate (LEXAPRO) 20 mg tablet Take 20 mg by mouth daily.      minoxidil (ROGAINE) 2 % external solution Apply  to affected area two (2) times a day. Apply to scalp and rub it in      VAYARIN 75-8.5-21.5 mg cap Take 1 Cap by mouth two (2) times a day.         STOP taking these medications       meloxicam (MOBIC) 15 mg tablet Comments:   Reason for Stopping:                 NOTIFY YOUR PHYSICIAN FOR ANY OF THE FOLLOWING:   Fever over 101 degrees for 24 hours.   Chest pain, shortness of breath, fever, chills, nausea, vomiting, diarrhea, change in mentation, falling, weakness, bleeding. Severe pain or pain not relieved by medications.  Or, any other signs or symptoms that you may have questions about.    DISPOSITION:    Home With:   OT  PT  HH  RN       Long term SNF/Inpatient Rehab    Independent/assisted living    Hospice    Other:       PATIENT CONDITION AT DISCHARGE:     Functional status    Poor     Deconditioned     Independent      Cognition     Lucid     Forgetful     Dementia      Catheters/lines (plus indication)    Foley     PICC     PEG     None      Code status     Full code     DNR      PHYSICAL EXAMINATION AT DISCHARGE:  General:          Alert, cooperative, no distress, appears stated age.     HEENT:           Atraumatic, anicteric sclerae, pink conjunctivae  No oral ulcers, mucosa moist, throat clear, dentition fair  Neck:               Supple, symmetrical   Lungs:             Clear to auscultation bilaterally.  No Wheezing or Rhonchi. No rales.  Chest wall:      No tenderness  No Accessory muscle use.  Heart:              Regular  rhythm,  No  murmur   No edema  Abdomen:        Soft, non-tender. Not distended.  Bowel sounds normal  Extremities:     No cyanosis.  No clubbing,                            Skin turgor normal, Capillary refill normal  Skin:                Not pale.  Not Jaundiced  No rashes   Psych:             Not anxious or agitated.  Neurologic:      Alert, moves all extremities, answers questions appropriately and responds to commands       CHRONIC MEDICAL DIAGNOSES:  Problem List as of 11/29/2019 Date Reviewed: 01-17-19          Codes Class Noted - Resolved    Numbness and tingling ICD-10-CM: R20.0, R20.2  ICD-9-CM: 782.0  09/07/2014 - Present        Other chest pain ICD-10-CM: R07.89  ICD-9-CM: 786.59  08/12/2012 - Present        MDD (major depressive disorder) (Chronic) ICD-10-CM: F32.9  ICD-9-CM: 296.20  08/12/2012 - Present        Elevated liver enzymes ICD-10-CM: R74.8  ICD-9-CM: 790.5  02/10/2012 - Present        Anxiety and depression ICD-10-CM: F41.9, F32.9  ICD-9-CM: 300.00, 311  02/10/2012 - Present        Chest pain, unspecified ICD-10-CM: R07.9  ICD-9-CM: 786.50  02/09/2012 - Present        Headache ICD-10-CM: R51.9  ICD-9-CM: 784.0  02/09/2012 - Present        HTN (hypertension) ICD-10-CM: I10  ICD-9-CM: 401.9  09/07/2014 - Present        Low back pain ICD-10-CM: M54.5  ICD-9-CM: 724.2  02/10/2012 - Present        Dizziness ICD-10-CM: R42  ICD-9-CM: 780.4  11/29/2019 - Present        CVA (cerebral vascular accident) (HCC) ICD-10-CM: I63.9  ICD-9-CM: 434.91  11/29/2019 - Present        Recurrent depression (HCC) ICD-10-CM: F33.9  ICD-9-CM: 296.30  10/30/2016 - Present        Mild neurocognitive disorder ICD-10-CM: G31.84  ICD-9-CM: 331.83  10/03/2016 - Present    Overview Signed 11/20/2016  7:48 PM by Cristine Polio, MD      neropsych eval -De Blanch.Ph.D with evidence of contributory psychiatric distress             Memory disturbance ICD-10-CM: R41.3  ICD-9-CM: 780.93  Unknown - Present        Acute chest pain ICD-10-CM: R07.9  ICD-9-CM: 786.50  09/06/2014 - Present        GAD (generalized anxiety disorder) (Chronic) ICD-10-CM: F41.1  ICD-9-CM: 300.02  01/19/2014 - Present        Hypertension ICD-10-CM: I10  ICD-9-CM: 401.9  Unknown - Present        Hepatic steatosis ICD-10-CM: K76.0  ICD-9-CM: 571.8  Unknown - Present        Normal cardiac stress test ICD-10-CM: IMO0001  ICD-9-CM: IMO0001  02/10/2012 - Present              Greater than 60 minutes were spent with the patient on counseling and coordination of care    Signed:   Fredrik Cove, MD  11/29/2019  4:10 PM

## 2020-01-10 MED FILL — XARELTO 20 MG TABLET: 20 | 30 days supply | Qty: 30 | Fill #1

## 2020-02-22 MED FILL — GABAPENTIN 300 MG CAPSULE: 300 | 30 days supply | Qty: 90 | Fill #1

## 2020-02-22 MED FILL — XARELTO 20 MG TABLET: 20 | 30 days supply | Qty: 30 | Fill #2

## 2020-03-03 ENCOUNTER — Inpatient Hospital Stay
Admit: 2020-03-03 | Discharge: 2020-03-03 | Disposition: A | Discharge status: Home / Self Care | Payer: BLUE CROSS/BLUE SHIELD | Attending: Emergency Medicine

## 2020-03-03 DIAGNOSIS — M25561 Pain in right knee: Secondary | ICD-10-CM

## 2020-03-03 MED ORDER — OXYCODONE 5 MG TAB
5 mg | ORAL_TABLET | Freq: Four times a day (QID) | ORAL | 0 refills | Status: AC | PRN
Start: 2020-03-03 — End: 2020-03-05

## 2020-03-03 MED ORDER — OXYCODONE-ACETAMINOPHEN 5 MG-325 MG TAB
5-325 mg | ORAL | Status: AC
Start: 2020-03-03 — End: 2020-03-03
  Administered 2020-03-03: 20:00:00 via ORAL

## 2020-03-03 MED ORDER — ACETAMINOPHEN 500 MG TAB
500 mg | ORAL_TABLET | Freq: Four times a day (QID) | ORAL | 0 refills | Status: AC | PRN
Start: 2020-03-03 — End: 2020-03-08

## 2020-03-03 MED FILL — OXYCODONE-ACETAMINOPHEN 5 MG-325 MG TAB: 5-325 mg | ORAL | Qty: 1

## 2020-03-03 NOTE — ED Notes (Signed)
Pt c/o bilateral knee pain that started after receiving stem cell injections in bilateral knees this morning.  Pt reports her pain has been increasing since the injections and she is no longer able to move her legs d/t pain.  Pt tried contacting MD and has been unable to get in touch with him.

## 2020-03-03 NOTE — ED Notes (Signed)
Physician reviewed discharge paperwork with patient. Patient assisted into wheelchair and taken to ED main entrance.

## 2020-03-03 NOTE — ED Provider Notes (Signed)
64 year old female with past medical she of depression, chronic pain syndrome, osteoarthritis, presents to ED due to bilateral knee pain following joint injections which she received at 10 AM today. Primary care provider Dr.[name] Catha Gosselin injected both knees with PRP today at 10 AM. Patient states by the time she got home she had significant pain in both of her knees. Since then she has had intolerable pain and has not been able to walk due to the discomfort. Has not had this type of injection in the past. Denies any fever, nausea, vomiting, chills. Denies any redness or swelling of her knees or legs. Denies weakness or numbness of legs. Took no medications prior to arrival.           Past Medical History:   Diagnosis Date   ??? Depression    ??? H/O colonoscopy 09-24-12    diverticulosis   ??? Hepatic steatosis    ??? Hypertension    ??? 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 Normal cardiac stress test 02-10-12   ??? Pneumonia        Past Surgical History:   Procedure Laterality Date   ??? HX GYN      tubal ligation         Family History:   Problem Relation Age of Onset   ??? Heart Disease Mother    ??? Heart Disease Father        Social History     Socioeconomic History   ??? Marital status: MARRIED     Spouse name: Not on file   ??? Number of children: Not on file   ??? Years of education: Not on file   ??? Highest education level: Not on file   Occupational History   ??? Not on file   Social Needs   ??? Financial resource strain: Not on file   ??? Food insecurity     Worry: Not on file     Inability: Not on file   ??? Transportation needs     Medical: Not on file     Non-medical: Not on file   Tobacco Use   ??? Smoking status: Never Smoker   ??? Smokeless tobacco: Never Used   Substance and Sexual Activity   ??? Alcohol use: No   ??? Drug use: No   ??? Sexual activity: Yes     Partners: Male     Birth control/protection: None   Lifestyle   ??? Physical activity     Days  per week: Not on file     Minutes per session: Not on file   ??? Stress: Not on file   Relationships   ??? Social Wellsite geologist on phone: Not on file     Gets together: Not on file     Attends religious service: Not on file     Active member of club or organization: Not on file     Attends meetings of clubs or organizations: Not on file     Relationship status: Not on file   ??? Intimate partner violence     Fear of current or ex partner: Not on file     Emotionally abused: Not on file     Physically abused: Not on file     Forced sexual activity: Not on file   Other Topics Concern   ??? Not on file   Social History Narrative   ???  Not on file         ALLERGIES: Amlodipine, Losartan, and Nifedipine    Review of Systems   Constitutional: Negative for fever.   HENT: Negative for congestion and sore throat.    Respiratory: Negative for cough and shortness of breath.    Cardiovascular: Negative for chest pain.   Gastrointestinal: Negative for nausea and vomiting.   Genitourinary: Negative for dysuria.   Musculoskeletal: Positive for arthralgias (Bilateral knees 10/10). Negative for myalgias.   Skin: Negative for rash.   Neurological: Negative for dizziness and weakness.       Vitals:    03/03/20 1441   BP: (!) 156/94   Pulse: 77   Resp: 16   Temp: 97.8 ??F (36.6 ??C)   SpO2: 98%            Physical Exam  Vitals signs and nursing note reviewed.   Constitutional:       General: She is in acute distress (crying in pain).   HENT:      Head: Normocephalic.      Nose: Nose normal.      Mouth/Throat:      Mouth: Mucous membranes are moist.   Eyes:      Extraocular Movements: Extraocular movements intact.   Neck:      Musculoskeletal: Normal range of motion.   Pulmonary:      Effort: Pulmonary effort is normal. No respiratory distress.   Musculoskeletal:      Right knee: She exhibits decreased range of motion. She exhibits no swelling, no ecchymosis and no erythema. Tenderness found.      Left knee: She exhibits decreased range of  motion. She exhibits no effusion and no deformity. Tenderness found.   Skin:     General: Skin is dry.      Findings: No rash.   Neurological:      Mental Status: She is alert and oriented to person, place, and time.   Psychiatric:         Mood and Affect: Mood normal.        Medications   oxyCODONE-acetaminophen (PERCOCET) 5-325 mg per tablet 1 Tab (1 Tab Oral Given 03/03/20 1544)     Labs Reviewed - No data to display  No orders to display         MDM  Number of Diagnoses or Management Options  Acute bilateral knee pain  Post procedure discomfort  Diagnosis management comments: Patient pain significantly improved with 1 dose Percocet. She is ambulatory with some assistance. Unable to contact the injecting provider, Dr. Luiz Ochoa. Discussed with orthopedics any acute complications regarding a PRP injection intra-articularly. Reviewed that only significant complication at this point time that would be this painful would be necrotizing fasciitis which does not appear to be present on this patient upon initial or reevaluation her skin and knees appear completely unremarkable. Too soon for any other associated articular infection. Discussed with patient likelihood of pain due to increased fluid in the synovial capsule. Discussed continued care with Tylenol, other pain medication as needed, and follow-up with Dr. Luiz Ochoa on Monday.         Procedures      Terressa Koyanagi, PA-C  03/03/2020

## 2020-03-03 NOTE — ED Triage Notes (Signed)
Pt c/o bilateral knee pain that started after receiving stem cell injections in bilateral knees this morning.  Pt reports her pain has been increasing since the injections and she is no longer able to move her legs d/t pain.  Pt tried contacting MD and has been unable to get in touch with him.

## 2020-03-03 NOTE — ED Provider Notes (Signed)
64 year old female with past medical she of depression, chronic pain syndrome, osteoarthritis, presents to ED due to bilateral knee pain following joint injections which she received at 10 AM today. Primary care provider Dr.[name] Catha Gosselin injected both knees with PRP today at 10 AM. Patient states by the time she got home she had significant pain in both of her knees. Since then she has had intolerable pain and has not been able to walk due to the discomfort. Has not had this type of injection in the past. Denies any fever, nausea, vomiting, chills. Denies any redness or swelling of her knees or legs. Denies weakness or numbness of legs. Took no medications prior to arrival.           Past Medical History:   Diagnosis Date   ??? Depression    ??? H/O colonoscopy 09-24-12    diverticulosis   ??? Hepatic steatosis    ??? Hypertension    ??? 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 Normal cardiac stress test 02-10-12   ??? Pneumonia        Past Surgical History:   Procedure Laterality Date   ??? HX GYN      tubal ligation         Family History:   Problem Relation Age of Onset   ??? Heart Disease Mother    ??? Heart Disease Father        Social History     Socioeconomic History   ??? Marital status: MARRIED     Spouse name: Not on file   ??? Number of children: Not on file   ??? Years of education: Not on file   ??? Highest education level: Not on file   Occupational History   ??? Not on file   Social Needs   ??? Financial resource strain: Not on file   ??? Food insecurity     Worry: Not on file     Inability: Not on file   ??? Transportation needs     Medical: Not on file     Non-medical: Not on file   Tobacco Use   ??? Smoking status: Never Smoker   ??? Smokeless tobacco: Never Used   Substance and Sexual Activity   ??? Alcohol use: No   ??? Drug use: No   ??? Sexual activity: Yes     Partners: Male     Birth control/protection: None   Lifestyle   ??? Physical activity     Days  per week: Not on file     Minutes per session: Not on file   ??? Stress: Not on file   Relationships   ??? Social Wellsite geologist on phone: Not on file     Gets together: Not on file     Attends religious service: Not on file     Active member of club or organization: Not on file     Attends meetings of clubs or organizations: Not on file     Relationship status: Not on file   ??? Intimate partner violence     Fear of current or ex partner: Not on file     Emotionally abused: Not on file     Physically abused: Not on file     Forced sexual activity: Not on file   Other Topics Concern   ??? Not on file   Social History Narrative   ???  Not on file         ALLERGIES: Amlodipine, Losartan, and Nifedipine    Review of Systems   Constitutional: Negative for fever.   HENT: Negative for congestion and sore throat.    Respiratory: Negative for cough and shortness of breath.    Cardiovascular: Negative for chest pain.   Gastrointestinal: Negative for nausea and vomiting.   Genitourinary: Negative for dysuria.   Musculoskeletal: Positive for arthralgias (Bilateral knees 10/10). Negative for myalgias.   Skin: Negative for rash.   Neurological: Negative for dizziness and weakness.       Vitals:    03/03/20 1441   BP: (!) 156/94   Pulse: 77   Resp: 16   Temp: 97.8 ??F (36.6 ??C)   SpO2: 98%            Physical Exam  Vitals signs and nursing note reviewed.   Constitutional:       General: She is in acute distress (crying in pain).   HENT:      Head: Normocephalic.      Nose: Nose normal.      Mouth/Throat:      Mouth: Mucous membranes are moist.   Eyes:      Extraocular Movements: Extraocular movements intact.   Neck:      Musculoskeletal: Normal range of motion.   Pulmonary:      Effort: Pulmonary effort is normal. No respiratory distress.   Musculoskeletal:      Right knee: She exhibits decreased range of motion. She exhibits no swelling, no ecchymosis and no erythema. Tenderness found.      Left knee: She exhibits decreased range of  motion. She exhibits no effusion and no deformity. Tenderness found.   Skin:     General: Skin is dry.      Findings: No rash.   Neurological:      Mental Status: She is alert and oriented to person, place, and time.   Psychiatric:         Mood and Affect: Mood normal.        Medications   oxyCODONE-acetaminophen (PERCOCET) 5-325 mg per tablet 1 Tab (1 Tab Oral Given 03/03/20 1544)     Labs Reviewed - No data to display  No orders to display         MDM  Number of Diagnoses or Management Options  Acute bilateral knee pain  Post procedure discomfort  Diagnosis management comments: Patient pain significantly improved with 1 dose Percocet. She is ambulatory with some assistance. Unable to contact the injecting provider, Dr. Banerjee. Discussed with orthopedics any acute complications regarding a PRP injection intra-articularly. Reviewed that only significant complication at this point time that would be this painful would be necrotizing fasciitis which does not appear to be present on this patient upon initial or reevaluation her skin and knees appear completely unremarkable. Too soon for any other associated articular infection. Discussed with patient likelihood of pain due to increased fluid in the synovial capsule. Discussed continued care with Tylenol, other pain medication as needed, and follow-up with Dr. Banerjee on Monday.         Procedures      Sarina Robleto M Dhriti Fales, PA-C  03/03/2020

## 2020-03-03 NOTE — ED Notes (Signed)
Physician reviewed discharge paperwork with patient. Patient assisted into wheelchair and taken to ED main entrance.

## 2020-05-24 ENCOUNTER — Ambulatory Visit: Payer: Medicaid Other | Admitting: Cardiology

## 2020-05-28 MED FILL — GABAPENTIN 300 MG CAPSULE: 300 | 30 days supply | Qty: 90 | Fill #2

## 2020-05-28 MED FILL — XARELTO 20 MG TABLET: 20 | 30 days supply | Qty: 30 | Fill #3

## 2020-06-05 DIAGNOSIS — I10 Essential (primary) hypertension: Secondary | ICD-10-CM

## 2020-06-11 MED FILL — XARELTO 20 MG TABLET: 20 | 30 days supply | Qty: 30 | Fill #3

## 2020-06-11 MED FILL — GABAPENTIN 300 MG CAPSULE: 300 | 30 days supply | Qty: 90 | Fill #2

## 2020-06-14 NOTE — Telephone Encounter (Signed)
-----   Message from Rossie Muskrat sent at 06/14/2020  3:48 PM EDT -----  Regarding: Dr. Wynonia Hazard Telephone  Caller's first and last name: Self     Reason for call: F/up appt    Callback required yes/no and why: Yes, to schedule f/up     Best contact number(s): [phone]    Details to clarify the request: Pt spoke to her psychiatrist and they recommended that she have updated memory test and a follow-up appt.

## 2020-06-20 NOTE — Telephone Encounter (Signed)
-----   Message from Maryclare Labrador sent at 06/20/2020 10:06 AM EDT -----  Regarding: Dr. Cristal Ford  General Message/Vendor Calls    Caller's first and last name: Patient       Reason for call: has been calling to get a f/up appt with Dr. Wynonia Hazard with no response, would like to have appt soon      Callback required yes/no and why: yes      Best contact number(s):[phone]      Details to clarify the request:      Maryclare Labrador

## 2020-06-27 NOTE — Telephone Encounter (Signed)
-----   Message from Rossie Muskrat sent at 06/27/2020 11:39 AM EDT -----  Regarding: Dr. Wynonia Hazard Telephone  Caller's first and last name: Self     Reason for call: F/up appt     Callback required yes/no and why: Yes, to make f/up appt     Best contact number(s): [phone]    Details to clarify the request: Pt is needing to schedule a f/up appt and for updated testing. Pt has been calling for several weeks and has left messages to schedule appt, but has not yet heard back from office. Pt's psychiatrist, Dr. Morrie Sheldon, is requesting the updated testing before the end of the year.

## 2020-07-04 ENCOUNTER — Other Ambulatory Visit: Payer: Self-pay | Admitting: Cardiology

## 2020-07-04 ENCOUNTER — Other Ambulatory Visit: Payer: Self-pay

## 2020-07-04 ENCOUNTER — Ambulatory Visit (INDEPENDENT_AMBULATORY_CARE_PROVIDER_SITE_OTHER): Payer: Medicaid Other | Admitting: Cardiology

## 2020-07-04 ENCOUNTER — Encounter: Payer: Self-pay | Admitting: Cardiology

## 2020-07-04 VITALS — BP 130/82 | HR 97 | Temp 97.6°F | Ht 62.0 in | Wt 189.6 lb

## 2020-07-04 DIAGNOSIS — Z7189 Other specified counseling: Secondary | ICD-10-CM

## 2020-07-04 DIAGNOSIS — Z6834 Body mass index (BMI) 34.0-34.9, adult: Secondary | ICD-10-CM | POA: Diagnosis not present

## 2020-07-04 DIAGNOSIS — I10 Essential (primary) hypertension: Secondary | ICD-10-CM | POA: Diagnosis not present

## 2020-07-04 DIAGNOSIS — Z131 Encounter for screening for diabetes mellitus: Secondary | ICD-10-CM

## 2020-07-04 DIAGNOSIS — R0789 Other chest pain: Secondary | ICD-10-CM | POA: Diagnosis not present

## 2020-07-04 DIAGNOSIS — E6609 Other obesity due to excess calories: Secondary | ICD-10-CM

## 2020-07-04 DIAGNOSIS — E785 Hyperlipidemia, unspecified: Secondary | ICD-10-CM | POA: Diagnosis not present

## 2020-07-04 DIAGNOSIS — Z8249 Family history of ischemic heart disease and other diseases of the circulatory system: Secondary | ICD-10-CM | POA: Diagnosis not present

## 2020-07-04 DIAGNOSIS — Z716 Tobacco abuse counseling: Secondary | ICD-10-CM | POA: Diagnosis not present

## 2020-07-04 MED ORDER — ATORVASTATIN CALCIUM 40 MG PO TABS: 40.0000 mg | ORAL_TABLET | Freq: Every day | ORAL | 3 refills | Status: DC

## 2020-07-04 MED FILL — ATORVASTATIN CALCIUM 40 MG: 40 | 90 days supply | Qty: 90 | Fill #0

## 2020-07-04 NOTE — Patient Instructions (Signed)
Medication Instructions:  Your Physician recommend you continue on your current medication as directed.    *If you need a refill on your cardiac medications before your next appointment, please call your pharmacy*   Lab Work: Your physician recommends that you return for lab work today ( BMP, Lipid, A1C)  If you have labs (blood work) drawn today and your tests are completely normal, you will receive your results only by:  MyChart Message (if you have [place]) OR  A paper copy in the mail If you have any lab test that is abnormal or we need to change your treatment, we will call you to review the results.   Testing/Procedures: None   Follow-Up: At [facility], you and your health needs are our priority.  As part of our continuing mission to provide you with exceptional heart care, we have created designated Provider Care Teams.  These Care Teams include your primary Cardiologist (physician) and Advanced Practice Providers (APPs -  Physician Assistants and Nurse Practitioners) who all work together to provide you with the care you need, when you need it.  We recommend signing up for the patient portal called "MyChart".  Sign up information is provided on this After Visit Summary.  MyChart is used to connect with patients for Virtual Visits (Telemedicine).  Patients are able to view lab/test results, encounter notes, upcoming appointments, etc.  Non-urgent messages can be sent to your provider as well.   To learn more about what you can do with MyChart, go to [email or web address].    Your next appointment:   1 year(s)  The format for your next appointment:   In Person  Provider:   Buford Dresser, MD

## 2020-07-04 NOTE — Progress Notes (Signed)
Cardiology Office Note:    Date:  07/04/2020   ID:  Tammy [name], DOB 1956-03-30, MRN [id number]  PCP:  [name], MD  Cardiologist:  Buford Dresser, MD  Referring MD: [name], MD   CC: follow up  History of Present Illness:    Tammy [name] is a 64 y.o. female with a hx of PMH HTN, HLD, tobacco use, DVT 2014, PE 2008, CIDP, CKD stage 3. She was seen virtually for a new chest pain evaluation on 03/09/19.  Today: Has occasional right upper chest tightness, random, no clear associated aggravating or alleviating factors.   Back to smoking slightly, 1 pack lasts a month.   Walks her granddaughter to the bus stop, about a block, daily. Breathing does get short, improves if she slows down.  She is out of of many of her medications, has been some time (awhile). Has a BP cuff at home but doesn't know how to use it. Has a nurse coming in the next week or two to show her. Reports home nurse says her BP fluctuates, but usually improves with resting.  Working on losing weight, has lost about 2 lbs, wants to lose 30 lbs. BMI 34 today.  Brother just had bypass surgery, age 71. Had stents prior.  Denies chest pain, shortness of breath at rest. No PND, orthopnea, LE edema or unexpected weight gain. No syncope or palpitations.  Past Medical History:  Diagnosis Date  . Chronic headaches   . DVT (deep venous thrombosis) ([place])   . Leukemia ([place])    Told she had it at age 65, given some shots  . Pulmonary embolism ([place])   . Seasonal allergies     Past Surgical History:  Procedure Laterality Date  . CESAREAN SECTION     one previous  . right arm fracture    . TUBAL LIGATION      Current Medications: Current Outpatient Medications on File Prior to Visit  Medication Sig  . gabapentin (NEURONTIN) 300 MG capsule Take 1 tablet in the morning and 2 tablet at bedtime.  . Pediatric Multivit-Minerals-C ([employer] GUMMY MULTIVITAMIN KIDS) CHEW Chew by mouth.  . rivaroxaban  (XARELTO) 20 MG TABS tablet Take 1 tablet (20 mg total) by mouth daily.  . clobetasol cream (TEMOVATE) 9.79 % Apply 1 application topically 2 (two) times daily. (Patient not taking: Reported on 07/04/2020)  . DULoxetine (CYMBALTA) 30 MG capsule Take 1 capsule (30 mg total) by mouth daily. (Patient not taking: Reported on 07/04/2020)  . hydrOXYzine (ATARAX/VISTARIL) 25 MG tablet Take 1 tablet (25 mg total) by mouth 2 (two) times daily as needed. (Patient not taking: Reported on 07/04/2020)   Current Facility-Administered Medications on File Prior to Visit  Medication  . methylPREDNISolone sodium succinate (SOLU-MEDROL) 1,000 mg in sodium chloride 0.9 % 50 mL IVPB     Allergies:   Patient has no known allergies.   Social History   Tobacco Use  . Smoking status: Light Tobacco Smoker    Packs/day: 0.00    Years: 40.00    Pack years: 0.00    Types: Cigarettes  . Smokeless tobacco: Never Used  . Tobacco comment: Patient reports smoking 2-3 cigarettes a day   Vaping Use  . Vaping Use: Never used  Substance Use Topics  . Alcohol use: No  . Drug use: No    Family History: family history includes Breast cancer in her maternal grandmother and mother; Cancer in her sister; Diabetes in her brother; Emphysema  in her father; Heart disease in her sister.  ROS:   Please see the history of present illness.  Additional pertinent ROS otherwise unremarkable  EKGs/Labs/Other Studies Reviewed:    The following studies were reviewed today: Echo 10/24/2015 - Left ventricle: The cavity size was normal. Wall thickness was  normal. Systolic function was normal. The estimated ejection  fraction was in the range of 55% to 60%. Wall motion was normal;  there were no regional wall motion abnormalities. Doppler  parameters are consistent with abnormal left ventricular  relaxation (grade 1 diastolic dysfunction).   Impressions:   - Normal LV systolic function; grade 1 diastolic dysfunction; trace    MR and TR.  EKG:  EKG is personally reviewed.  The ekg ordered today demonstrates NSR at 97 bpm  Recent Labs: 10/06/2019: ALT 14; BUN 14; Creatinine 1.27; Hemoglobin 12.2; Platelet Count 344; Potassium 4.2; Sodium 141  Recent Lipid Panel    Component Value Date/Time   CHOL 221 (H) 05/13/2017 0956   TRIG 326 (H) 05/13/2017 0956   HDL 40 05/13/2017 0956   CHOLHDL 5.5 (H) 05/13/2017 0956   CHOLHDL 7.2 (H) 12/24/2015 1109   VLDL 74 (H) 12/24/2015 1109   LDLCALC 116 (H) 05/13/2017 0956    Physical Exam:    VS:  BP 130/82 (BP Location: Left Arm, Patient Position: Sitting, Cuff Size: Normal)   Pulse 97   Temp 97.6 F (36.4 C)   Ht 5\' 2"  (1.575 m)   Wt 189 lb 9.6 oz (86 kg)   SpO2 95% Comment: room air  BMI 34.68 kg/m     Wt Readings from Last 3 Encounters:  07/04/20 189 lb 9.6 oz (86 kg)  10/06/19 191 lb 14.4 oz (87 kg)  09/23/19 190 lb (86.2 kg)    GEN: Well nourished, well developed in no acute distress HEENT: Normal, moist mucous membranes NECK: No JVD CARDIAC: regular rhythm, normal S1 and S2, no rubs or gallops. No murmur. VASCULAR: Radial and DP pulses 2+ bilaterally. No carotid bruits RESPIRATORY:  Clear to auscultation without rales, wheezing or rhonchi  ABDOMEN: Soft, non-tender, non-distended MUSCULOSKELETAL:  Ambulates independently SKIN: Warm and dry, no edema NEUROLOGIC:  Alert and oriented x 3. No focal neuro deficits noted. PSYCHIATRIC:  Normal affect   ASSESSMENT:    1. Essential hypertension   2. Dyslipidemia   3. Family history of heart disease   4. Class 1 obesity due to excess calories without serious comorbidity with body mass index (BMI) of 34.0 to 34.9 in adult   5. Atypical chest pain   6. Tobacco abuse counseling   7. Cardiac risk counseling   8. Counseling on health promotion and disease prevention   9. Encounter for screening for diabetes mellitus    PLAN:    Chest pain: right sided pain is atypical -CT coronarywith calcium score  of 0, no plaque. Excellent long term prognosis based on this  Hypertension: near goal today -has been off amlodipine for some time, BP today near goal -has home nurse, BP cuff. Will get home measurement. -if BP rises, would restart amlodipine  Hyperlipidemia -had order for updated lipids, but not done. Reordered today. -written for atorvastatin 40 mg daily, has taken intermittently in the past. We discussed risk, calcium score, guidelines. With her strong family history and risk factors, she would like to continue -With 0 calcium score, has low risk for future events.  Obesity, BMI 34: -discussed diet, exercise, weight loss today  History of DVT/PE: lifelong anticoagulation,  on rivaroxaban   Tobacco counseling: The patient was counseled on tobacco cessation today for 4 minutes.  Counseling included reviewing the risks of smoking tobacco products, how it impacts the patient's current medical diagnoses and different strategies for quitting.  Pharmacotherapy to aid in tobacco cessation was not prescribed today.  Cardiac risk counseling and prevention recommendations: -recommend heart healthy/Mediterranean diet, with whole grains, fruits, vegetable, fish, lean meats, nuts, and olive oil. Limit salt. -recommend moderate walking, 3-5 times/week for 30-50 minutes each session. Aim for at least 150 minutes.week. Goal should be pace of 3 miles/hours, or walking 1.5 miles in 30 minutes -recommend avoidance of tobacco products. Avoid excess alcohol. -Additional risk factor control:  -Diabetes risk: A1c last 5.7 in 2018. With significant risk factors, will check today -ASCVD risk score: The ASCVD Risk score Mikey Bussing DC Jr., et al., 2013) failed to calculate for the following reasons:   Cannot find a previous HDL lab   Cannot find a previous total cholesterol lab    Plan for follow up: 1 year or sooner as needed  Buford Dresser, MD, PhD [place]  [facility]    Medication  Adjustments/Labs and Tests Ordered: Current medicines are reviewed at length with the patient today.  Concerns regarding medicines are outlined above.  Orders Placed This Encounter  Procedures  . Lipid panel  . Basic metabolic panel  . Hemoglobin A1c  . EKG 12-Lead   Meds ordered this encounter  Medications  . atorvastatin (LIPITOR) 40 MG tablet    Sig: Take 1 tablet (40 mg total) by mouth daily.    Dispense:  90 tablet    Refill:  3    Patient Instructions  Medication Instructions:  Your Physician recommend you continue on your current medication as directed.    *If you need a refill on your cardiac medications before your next appointment, please call your pharmacy*   Lab Work: Your physician recommends that you return for lab work today ( BMP, Lipid, A1C)  If you have labs (blood work) drawn today and your tests are completely normal, you will receive your results only by: Marland Kitchen MyChart Message (if you have MyChart) OR . A paper copy in the mail If you have any lab test that is abnormal or we need to change your treatment, we will call you to review the results.   Testing/Procedures: None   Follow-Up: At [facility], you and your health needs are our priority.  As part of our continuing mission to provide you with exceptional heart care, we have created designated Provider Care Teams.  These Care Teams include your primary Cardiologist (physician) and Advanced Practice Providers (APPs -  Physician Assistants and Nurse Practitioners) who all work together to provide you with the care you need, when you need it.  We recommend signing up for the patient portal called "MyChart".  Sign up information is provided on this After Visit Summary.  MyChart is used to connect with patients for Virtual Visits (Telemedicine).  Patients are able to view lab/test results, encounter notes, upcoming appointments, etc.  Non-urgent messages can be sent to your provider as well.   To learn more  about what you can do with MyChart, go to [email or web address].    Your next appointment:   1 year(s)  The format for your next appointment:   In Person  Provider:   Buford Dresser, MD       Signed, Buford Dresser, MD PhD 07/04/2020 1:10 PM    [facility]

## 2020-07-05 LAB — BASIC METABOLIC PANEL
BUN/Creatinine Ratio: 10 — ABNORMAL LOW (ref 12–28)
BUN: 11 mg/dL (ref 8–27)
CO2: 21 mmol/L (ref 20–29)
Calcium: 9.8 mg/dL (ref 8.7–10.3)
Chloride: 102 mmol/L (ref 96–106)
Creatinine, Ser: 1.06 mg/dL — ABNORMAL HIGH (ref 0.57–1.00)
GFR calc Af Amer: 65 mL/min/{1.73_m2} (ref >59)
GFR calc non Af Amer: 56 mL/min/{1.73_m2} — ABNORMAL LOW (ref >59)
Glucose: 101 mg/dL — ABNORMAL HIGH (ref 65–99)
Potassium: 4.8 mmol/L (ref 3.5–5.2)
Sodium: 138 mmol/L (ref 134–144)

## 2020-07-05 LAB — LIPID PANEL
Chol/HDL Ratio: 7.1 ratio — ABNORMAL HIGH (ref 0.0–4.4)
Cholesterol, Total: 235 mg/dL — ABNORMAL HIGH (ref 100–199)
HDL: 33 mg/dL — ABNORMAL LOW (ref >39)
LDL Chol Calc (NIH): 142 mg/dL — ABNORMAL HIGH (ref 0–99)
Triglycerides: 325 mg/dL — ABNORMAL HIGH (ref 0–149)
VLDL Cholesterol Cal: 60 mg/dL — ABNORMAL HIGH (ref 5–40)

## 2020-07-05 LAB — HEMOGLOBIN A1C
Est. average glucose Bld gHb Est-mCnc: 111 mg/dL
Hgb A1c MFr Bld: 5.5 % (ref 4.8–5.6)

## 2020-08-09 MED FILL — XARELTO 20 MG TABLET: 20 | 30 days supply | Qty: 30 | Fill #4

## 2020-08-09 MED FILL — GABAPENTIN 300 MG CAPSULE: 300 | 30 days supply | Qty: 90 | Fill #3

## 2020-08-09 MED FILL — ATORVASTATIN 40 MG TABLET: 40 | 90 days supply | Qty: 90 | Fill #0

## 2020-09-13 NOTE — Telephone Encounter (Signed)
Patient said Dr. Kirtland Bouchard will be getting a fax from [facility] asking for updated notes but she hasn't seen him since the last time it was filled out.

## 2020-10-05 ENCOUNTER — Inpatient Hospital Stay: Payer: Medicaid Other | Attending: Hematology and Oncology | Admitting: Hematology and Oncology

## 2020-10-05 ENCOUNTER — Encounter: Payer: Self-pay | Admitting: Hematology and Oncology

## 2020-10-05 ENCOUNTER — Other Ambulatory Visit: Payer: Self-pay | Admitting: Hematology and Oncology

## 2020-10-05 ENCOUNTER — Other Ambulatory Visit: Payer: Self-pay

## 2020-10-05 ENCOUNTER — Inpatient Hospital Stay: Payer: Medicaid Other

## 2020-10-05 DIAGNOSIS — G6181 Chronic inflammatory demyelinating polyneuritis: Secondary | ICD-10-CM

## 2020-10-05 DIAGNOSIS — I825Y9 Chronic embolism and thrombosis of unspecified deep veins of unspecified proximal lower extremity: Secondary | ICD-10-CM | POA: Diagnosis not present

## 2020-10-05 DIAGNOSIS — Z299 Encounter for prophylactic measures, unspecified: Secondary | ICD-10-CM | POA: Diagnosis not present

## 2020-10-05 DIAGNOSIS — Z79899 Other long term (current) drug therapy: Secondary | ICD-10-CM | POA: Insufficient documentation

## 2020-10-05 DIAGNOSIS — N183 Chronic kidney disease, stage 3 unspecified: Secondary | ICD-10-CM

## 2020-10-05 DIAGNOSIS — Z86718 Personal history of other venous thrombosis and embolism: Secondary | ICD-10-CM | POA: Diagnosis not present

## 2020-10-05 DIAGNOSIS — R944 Abnormal results of kidney function studies: Secondary | ICD-10-CM | POA: Insufficient documentation

## 2020-10-05 LAB — COMPREHENSIVE METABOLIC PANEL
ALT: 13 U/L (ref 0–44)
AST: 18 U/L (ref 15–41)
Albumin: 3.4 g/dL — ABNORMAL LOW (ref 3.5–5.0)
Alkaline Phosphatase: 117 U/L (ref 38–126)
Anion gap: 10 (ref 5–15)
BUN: 10 mg/dL (ref 8–23)
CO2: 23 mmol/L (ref 22–32)
Calcium: 9.4 mg/dL (ref 8.9–10.3)
Chloride: 105 mmol/L (ref 98–111)
Creatinine, Ser: 1.11 mg/dL — ABNORMAL HIGH (ref 0.44–1.00)
GFR, Estimated: 56 mL/min — ABNORMAL LOW (ref >60)
Glucose, Bld: 117 mg/dL — ABNORMAL HIGH (ref 70–99)
Potassium: 4.1 mmol/L (ref 3.5–5.1)
Sodium: 138 mmol/L (ref 135–145)
Total Bilirubin: 1.3 mg/dL — ABNORMAL HIGH (ref 0.3–1.2)
Total Protein: 9.4 g/dL — ABNORMAL HIGH (ref 6.5–8.1)

## 2020-10-05 LAB — CBC WITH DIFFERENTIAL/PLATELET
Abs Immature Granulocytes: 0.04 10*3/uL (ref 0.00–0.07)
Basophils Absolute: 0.1 10*3/uL (ref 0.0–0.1)
Basophils Relative: 1 %
Eosinophils Absolute: 0 10*3/uL (ref 0.0–0.5)
Eosinophils Relative: 0 %
HCT: 37.4 % (ref 36.0–46.0)
Hemoglobin: 12.2 g/dL (ref 12.0–15.0)
Immature Granulocytes: 1 %
Lymphocytes Relative: 26 %
Lymphs Abs: 1.9 10*3/uL (ref 0.7–4.0)
MCH: 30 pg (ref 26.0–34.0)
MCHC: 32.6 g/dL (ref 30.0–36.0)
MCV: 92.1 fL (ref 80.0–100.0)
Monocytes Absolute: 0.6 10*3/uL (ref 0.1–1.0)
Monocytes Relative: 9 %
Neutro Abs: 4.6 10*3/uL (ref 1.7–7.7)
Neutrophils Relative %: 63 %
Platelets: 377 10*3/uL (ref 150–400)
RBC: 4.06 MIL/uL (ref 3.87–5.11)
RDW: 14.1 % (ref 11.5–15.5)
WBC: 7.3 10*3/uL (ref 4.0–10.5)
nRBC: 0 % (ref 0.0–0.2)

## 2020-10-05 MED ORDER — RIVAROXABAN 20 MG PO TABS: 20.0000 mg | ORAL_TABLET | Freq: Every day | ORAL | 11 refills | Status: DC

## 2020-10-05 MED FILL — XARELTO 20 MG TABLET: 20 | 30 days supply | Qty: 30 | Fill #0

## 2020-10-05 NOTE — Assessment & Plan Note (Signed)
She is getting monthly IVIG as directed by her neurologist She is taking gabapentin and Lyrica for pain

## 2020-10-05 NOTE — Progress Notes (Signed)
[place] OFFICE PROGRESS NOTE  [name], MD  ASSESSMENT & PLAN:  DVT (deep venous thrombosis) She is doing with very well since her treatment was switched to Xarelto. She has no complications from treatment. Goal of treatment is lifelong. I will see her once a year She is educated to watch for signs and symptoms of bleeding  CKD (chronic kidney disease) stage 3, GFR 30-59 ml/min She has intermittent elevated serum creatinine Her creatinine clearance is adequate for her to remain on Xarelto I will continue to follow her once a year  CIDP (chronic inflammatory demyelinating polyneuropathy) ([place]) She is getting monthly IVIG as directed by her neurologist She is taking gabapentin and Lyrica for pain  Preventive measure We discussed the importance of influenza vaccination, pneumococcal vaccination and Covid vaccination The patient declined vaccination   Orders Placed This Encounter  Procedures  . CBC with Differential/Platelet    Standing Status:   Standing    Number of Occurrences:   22    Standing Expiration Date:   10/05/2021  . Basic Metabolic Panel - [place] Only    Standing Status:   Future    Standing Expiration Date:   10/05/2021    The total time spent in the appointment was 20 minutes encounter with patients including review of chart and various tests results, discussions about plan of care and coordination of care plan   All questions were answered. The patient knows to call the clinic with any problems, questions or concerns. No barriers to learning was detected.    [name], MD 12/3/202110:02 AM  INTERVAL HISTORY: Tammy [name] 64 y.o. 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 According to the patient, she was first diagnosed with blood clot  in 2008. According to the patient it was unprovoked. The patient complained of shortness of breath and cough and chest discomfort. She was smoking at that time. She had imaging study of the lung done which show evidence of PE. According to the patient she was on a blood thinner for 2 years. The date of the CT scan was 06/24/2007. In 2014, she started to complain of intermittent sharp discomfort on the right leg associated with some mild swelling. She states that she had pain on the left leg as well but is not as severe compared to the right leg. She went to the emergency department. Results of the ultrasound venous Doppler came back consistent with acute deep vein thrombosis involving the posterior tibial and perioneal vein of the right lower extremity. D-dimer was elevated. She was placed on Lovenox and transition to warfarin 5 mg daily. Her INR has been supratherapeutic requiring vitamin K and multiple dose interruption to keep her INR at the desire range at 2-3. She denies any bleeding complications such as spontaneous epistaxis, hematuria, hematochezia, or the need for blood transfusions. She had mild intermittent chest discomfort throughout this and had imaging study done which show possible new clot in the left lung on the CT scan from September 2014. She has been pregnant 4 times including C-section for her pregnancy and never developed any peripartum blood clots. There were no family history of blood clots. In October 2014, I made a decision to change her to Xarelto.  Duration of treatment is lifelong  I have reviewed the past  medical history, past surgical history, social history and family history with the patient and they are unchanged from previous note.  ALLERGIES:  has No Known Allergies.  MEDICATIONS:  Current Outpatient Medications  Medication Sig Dispense Refill  . atorvastatin (LIPITOR) 40 MG tablet Take 1 tablet (40 mg total) by mouth daily. 90 tablet 3  . clobetasol cream (TEMOVATE)  6.59 % Apply 1 application topically 2 (two) times daily. (Patient not taking: Reported on 07/04/2020) 60 g 3  . DULoxetine (CYMBALTA) 30 MG capsule Take 1 capsule (30 mg total) by mouth daily. (Patient not taking: Reported on 07/04/2020) 30 capsule 3  . gabapentin (NEURONTIN) 300 MG capsule Take 1 tablet in the morning and 2 tablet at bedtime. 270 capsule 3  . Pediatric Multivit-Minerals-C ([employer] GUMMY MULTIVITAMIN KIDS) CHEW Chew by mouth.    . rivaroxaban (XARELTO) 20 MG TABS tablet Take 1 tablet (20 mg total) by mouth daily. 30 tablet 11   Current Facility-Administered Medications  Medication Dose Route Frequency Provider Last Rate Last Admin  . methylPREDNISolone sodium succinate (SOLU-MEDROL) 1,000 mg in sodium chloride 0.9 % 50 mL IVPB  1,000 mg Intravenous Q28 days Alda Berthold, DO 58 mL/hr at 01/27/18 0928 1,000 mg at 01/27/18 9357     REVIEW OF SYSTEMS:   Constitutional: Denies fevers, chills or night sweats Eyes: Denies blurriness of vision Ears, nose, mouth, throat, and face: Denies mucositis or sore throat Respiratory: Denies cough, dyspnea or wheezes Cardiovascular: Denies palpitation, chest discomfort or lower extremity swelling Gastrointestinal:  Denies nausea, heartburn or change in bowel habits Skin: Denies abnormal skin rashes Lymphatics: Denies new lymphadenopathy or easy bruising Neurological:Denies numbness, tingling or new weaknesses Behavioral/Psych: Mood is stable, no new changes  All other systems were reviewed with the patient and are negative.  PHYSICAL EXAMINATION: ECOG PERFORMANCE STATUS: 0 - Asymptomatic  Vitals:   10/05/20 0938  BP: (!) 149/75  Pulse: (!) 104  Resp: 18  Temp: (!) 97.5 F (36.4 C)  SpO2: 98%   Filed Weights   10/05/20 0938  Weight: 188 lb 6.4 oz (85.5 kg)    GENERAL:alert, no distress and comfortable NEURO: alert & oriented x 3 with fluent speech, no focal motor/sensory deficits  LABORATORY DATA:  I have reviewed the data as  listed     Component Value Date/Time   NA 138 07/04/2020 0958   NA 141 08/11/2013 1046   K 4.8 07/04/2020 0958   K 4.3 08/11/2013 1046   CL 102 07/04/2020 0958   CO2 21 07/04/2020 0958   CO2 27 08/11/2013 1046   GLUCOSE 101 (H) 07/04/2020 0958   GLUCOSE 145 (H) 10/06/2019 0932   GLUCOSE 102 08/11/2013 1046   BUN 11 07/04/2020 0958   BUN 10.5 08/11/2013 1046   CREATININE 1.06 (H) 07/04/2020 0958   CREATININE 1.27 (H) 10/06/2019 0932   CREATININE 0.99 10/22/2015 1159   CREATININE 1.0 08/11/2013 1046   CALCIUM 9.8 07/04/2020 0958   CALCIUM 9.5 08/11/2013 1046   PROT 8.5 (H) 10/06/2019 0932   PROT 7.3 05/13/2017 0956   PROT 7.3 08/11/2013 1046   ALBUMIN 3.4 (L) 10/06/2019 0932   ALBUMIN 4.1 11/10/2017 1444   ALBUMIN 3.4 (L) 08/11/2013 1046   AST 17 10/06/2019 0932   AST 15 08/11/2013 1046   ALT 14 10/06/2019 0932   ALT 13 08/11/2013 1046   ALKPHOS 138 (H) 10/06/2019 0932   ALKPHOS 112 08/11/2013 1046   BILITOT 0.7 10/06/2019 0932   BILITOT 0.73 08/11/2013 1046  GFRNONAA 56 (L) 07/04/2020 0958   GFRNONAA 46 (L) 10/06/2019 0932   GFRNONAA 63 10/22/2015 1159   GFRAA 65 07/04/2020 0958   GFRAA 53 (L) 10/06/2019 0932   GFRAA 73 10/22/2015 1159    No results found for: SPEP, UPEP  Lab Results  Component Value Date   WBC 7.3 10/05/2020   NEUTROABS 4.6 10/05/2020   HGB 12.2 10/05/2020   HCT 37.4 10/05/2020   MCV 92.1 10/05/2020   PLT 377 10/05/2020      Chemistry      Component Value Date/Time   NA 138 07/04/2020 0958   NA 141 08/11/2013 1046   K 4.8 07/04/2020 0958   K 4.3 08/11/2013 1046   CL 102 07/04/2020 0958   CO2 21 07/04/2020 0958   CO2 27 08/11/2013 1046   BUN 11 07/04/2020 0958   BUN 10.5 08/11/2013 1046   CREATININE 1.06 (H) 07/04/2020 0958   CREATININE 1.27 (H) 10/06/2019 0932   CREATININE 0.99 10/22/2015 1159   CREATININE 1.0 08/11/2013 1046      Component Value Date/Time   CALCIUM 9.8 07/04/2020 0958   CALCIUM 9.5 08/11/2013 1046    ALKPHOS 138 (H) 10/06/2019 0932   ALKPHOS 112 08/11/2013 1046   AST 17 10/06/2019 0932   AST 15 08/11/2013 1046   ALT 14 10/06/2019 0932   ALT 13 08/11/2013 1046   BILITOT 0.7 10/06/2019 0932   BILITOT 0.73 08/11/2013 1046

## 2020-10-05 NOTE — Assessment & Plan Note (Signed)
She has intermittent elevated serum creatinine Her creatinine clearance is adequate for her to remain on Xarelto I will continue to follow her once a year

## 2020-10-05 NOTE — Assessment & Plan Note (Signed)
We discussed the importance of influenza vaccination, pneumococcal vaccination and Covid vaccination The patient declined vaccination

## 2020-10-05 NOTE — Assessment & Plan Note (Signed)
She is doing with very well since her treatment was switched to Xarelto. She has no complications from treatment. Goal of treatment is lifelong. I will see her once a year She is educated to watch for signs and symptoms of bleeding

## 2020-12-11 MED FILL — ATORVASTATIN 40 MG TABLET: 40 | 90 days supply | Qty: 90 | Fill #1

## 2020-12-11 MED FILL — XARELTO 20 MG TABLET: 20 | 30 days supply | Qty: 30 | Fill #1

## 2021-01-17 ENCOUNTER — Ambulatory Visit: Attending: Clinical Neuropsychologist | Primary: Family Medicine

## 2021-01-17 ENCOUNTER — Ambulatory Visit: Admit: 2021-01-17 | Payer: MEDICARE | Attending: Clinical Neuropsychologist | Primary: Family Medicine

## 2021-01-17 DIAGNOSIS — G3184 Mild cognitive impairment, so stated: Secondary | ICD-10-CM

## 2021-01-17 DIAGNOSIS — Z5329 Procedure and treatment not carried out because of patient's decision for other reasons: Secondary | ICD-10-CM

## 2021-01-17 NOTE — Progress Notes (Signed)
Progress Notes by Annitta Needs, PsyD at 01/17/21 1200                Author: Annitta Needs, PsyD  Service: --  Author Type: Psychologist       Filed: 01/17/21 0918  Encounter Date: 01/17/2021  Status: Signed          Editor: Annitta Needs, PsyD (Psychologist)                  [facility]   [facility]   NEUROLOGY CLINIC    [address] [place] [address]    [place], [place] [address]    [phone] Office    [phone] Fax         Neuropsychology       Exam # [id number]      Initial Diagnostic Interview Note         Referral:  Vanessa [name], Vanessa [name], Vanessa [name] is a 65 y.o. [name]  handed married African American  who was accompanied to the initial clinical interview on 01/17/21 .  Please refer to her medical records for  details pertaining to her history.   At the start of the appointment, I reviewed the patient's [facility] Epic Chart (including Media scanned in from  previous providers) for the active Problem List, all pertinent Past Medical Hx, medications, recent radiologic and laboratory findings.  In addition, I reviewed pt's documented Immunization Record and Encounter History.       When I saw her last:   Vanessa [name] is a 65 y.o. [name] handed married African American female who was unaccompanied to the initial clinical interview on 12/22/18 .  Please refer  to her medical records for details pertaining to her history.     At the start of the appointment, I reviewed the patient's [facility] Epic Chart (including Media scanned in from previous providers) for the active Problem List, all pertinent Past Medical Hx, medications, recent radiologic and laboratory findings. ??In  addition, I reviewed pt's documented Immunization Record and Encounter History.   When I saw her last:   ??   Vanessa [name] Reason??is a 65 y.o.??[name] handed married [place] American female who was unaccompanied to the initial clinical interview on 04/16/18 . ??Please refer  to her??medical records for details pertaining to her??history. ??Briefly, the patient  reported that she completed two years of college without history of previously diagnosed LD and/or receipt of special education services. ??Stopped working June 3 and she has applied for long term disability. ??She has been dealing with a progressive  decline in short term memory. ??Forgets the content of conversations. Misplaces things. Has to write everything down. ??Starts tasks and does not complete. Loses words. ??Loses train of thought. ??Loses two pairs of glasses and three diamond rings  in the past two months. ??Slower to learn and comprehend new things. ??No history of stroke, meningitis/encephalitis, RMS Fever, Lupus, Lyme, TBI, sz. ??She gets headaches when she is really tired. ??Her mother had dementia and was showing memory  problems in her 7s and died at age 6. ??Patient has not noticed no major changes in her sense of taste or smell. ??She has missed exits when driving and avoids driving at night or with heavy traffic. ??No accidents or near misses. ??She is fairly  independent for medications but keeps notes, and same with finances. ??She is independent for her ADLs. ??Sleep is poor, and has been  poor for quite some time. ??She does get frequent headaches and is tired a lot which makes the headaches worse,  too. Appetite is variable, but has increased. ??She has been on different medications for depression and anxiety for a number of years now and is currently on Wellbutrin and Lexapro. ??She is seeing Dr. Roddie Mc for psychotherapy and continues  to engage in same. ??She has noticed no major improvements there and cognition is declining. ??However, the medications do help with panic attacks, which are less frequent now. ??She has having a lot more panic attacks when working (2-3x a week).  ??No falls but loses her balance on occasion. ??She is engaged in church activities. ??Marriage is going well and family notices the cognitive  decline and are noted. ??   ??   I also reviewed job performance reviews and improvement plans and cognitive issues are noted here and are noted as ongoing and worsening despite attempts and understanding and moves towards improved job performance. ??   ??   Date: ??02/01/2018    Requesting Physician: ??Stacey L. Epps, Vanessa [name]    An EEG is requested in this 65 year-old woman to evaluate for   epileptiform abnormalities. ??    Medications: ??Medications are listed as Lexapro, Diovan,  calcium.   ??    This tracing is obtained during the awake state. ??During   wakefulness, there are brief intermittent runs of   posteriorly-dominant and symmetrical low-to-medium amplitude 9   cycle per second activities which  attenuate with eye opening. ??  Lower-voltage faster-frequency activities are seen symmetrically   over the anterior head regions.    Hyperventilation is not performed due to age and medical history.   ??    Intermittent  photic stimulation induces symmetric posterior   driving responses. ??    Sleep is not attained. ??    Interpretation: ??This EEG recorded during the awake state is   normal. ??No epileptiform abnormalities are seen.    ??   ??   Result Information    ??      Status: Final result (Exam End: 11/22/2017 17:12)  Provider Status: Open     Study Result    ??     EXAM: ??MRI BRAIN W WO CONT   INDICATION: ??mild cognitive impairment, G 31.84, memory loss, blackouts, sharp   pain in shoulders and needles in arms and legs worse on [name] side.   TECHNIQUE: Sagittal and axial T1 weighted images were obtained followed by   intravenous infusion 13 mL Dotarem and axial FLAIR and T-2 weighted images.   Delayed axial and coronal postgadolinium T1-weighted images were obtained as   well as sagittal FLAIR images and axial diffusion weighted images.   COMPARISON: MRI of the head 04/08/13, CT 02/16/13   FINDINGS:   The ventricular size and configuration are normal.    Scattered small foci of T2 hyperintensity again demonstrated in the  cerebral   white matter most prominent in the left frontal lobe without significant change   since the MRI 04/08/13. No associated abnormal enhancement or restricted   diffusion.   There is no evidence of intracranial hemorrhage, infarct, mass or abnormal   extra-axial fluid collections.    Flow voids are present in the vertebral, basilar and carotid artery systems. ??   The craniocervical junction is unremarkable.    The other structures of the cranial base including paranasal sinuses are    unremarkable.   ??   IMPRESSION   IMPRESSION:  1. Chronic nonspecific foci of T2 hyperintensity cerebral white matter without   significant change since 04/08/13.   2. No acute intracranial abnormality demonstrated..   ??     ??   ??   ??   Neuropsych reviewed from 2018. ????This is a lengthy report and history reported is consistent with history reported here. ??Concerns for MCI with mood issues raised and now concern is for a dementia versus pseudodementia. ??   ??   ??   ??   Dx then included:   ??   This patient has now undergone two Neuropsychological Evaluations (previously by another provider in 2017 and the incident evaluation with me). She was previously diagnosed with Mild Cognitive Impairment and there was significant psychiatric overlay.  ??The current test results indicate a mild DECLINE in cognitive functioning over time, with significant problems noted in terms of severe deficits in attention (previously moderate) and significant (moderate to severe) decline in visual memory. ??Interestingly,  her auditory memory is fully normal, which leads away from a dementia type diagnosis. ??She is also showing borderline range problems with working memory and processing speed, as well as problems with bilateral motor skills. ??Executive functioning,  perceptual reasoning, verbal comprehension, verbal fluency, and confrontation naming remain normal. ??From an emotional standpoint, she reports severe depression and severe anxiety on two brief  self-report questionnaires. ??Lengthier assessment of  personality functioning could not be further interpreted due to item confusion/comprehension and consistency problems. ??She previously generated a valid PAI which showed depression, anxiety, and PTSD. ??Ongoing PTSD issues are likely.   ??   ????????????????????????While the current profile not consistent with dementia, there is sufficient evidence to suggest a worsening mild cognitive disorder with significant psychiatric overlay. Consideration for psychiatric medication  management for marked attention problems is suggested, along with psychiatric treatment and intensive psychotherapy for severe mood problems. ??Consider EMDR. ??At this point, her problems are worsening, which is showing an increased impact on vocational  functioning, especially on any task requiring attention and/or memory, as well as mild issues with motor skills. ??Additionally, stress at work will exacerbate the underlying cognitive deficits. I see no evidence which would suggest malingering ??I  suggest short term disability be considered so as for her to fully engage with psychiatric and psychologic treatment, with a hopeful reduction in her cognitive difficulties pending successful psychiatric intervention. ??Her prognosis is currently guarded,  especially as a decline is seen over time. ??I find her competent currently, but she needs supervision for medications and finances, and I have concerns for driving safety at this time. ??We now have baseline and updated data on her. ??Follow up  in six months or prn, especially if there are any declines. ??Clinical correlation is, of course, indicated. ??   ??   ????????????????????????I will discuss these findings with the patient when she follows up with me in the near future. ??A follow up Neuropsychological Evaluation is indicated on a prn basis, especially if there are any cognitive  and/or emotional changes. ??   ??   DIAGNOSES:??????????????????????????????????????????????????Mild Cognitive Impairment  (worsening somewhat over time)??   ????????????????????????????????????????????????????????????????????????????????????????????????Major Depression - Severe    ????????????????????????????????????????????????????????????????????????????????????????????????Anxiety Disorder - Severe   ????????????????????????????????????????????????????????????????????????????????????????????????History of PTSD??????????????????????????????????????   ??   Since I saw her last, the patient saw a Clinical Psychologist (Dr. Ginny Forth) and has been working from October 1 to December on working on cognitive functioning.  She can't remember things.  She has lost her ability to spell every day common  words.  She  can't complete a sentence of find the [name] words.  She has coordination and motor skills issues.  Her neck blades feel like they are on fire.  She can't put water on her upper back or put on clothes because her skin feels raw.  She is  followed by psychiatry.  She is participating in meditation ([employer]), and helps with pain management.  She has had increased falls.   Her memory has been getting worse.  IT was suggest she see ortho due to back pain and knee pain.  Her  knees have been buckling.  She feels very jittery. Her skin hurts.  I do note that her EMG, EEG, carotid Doppler, MRI of the cervical spine, were normal.  Considering rheumatology. Showing up to wrong places when driving.  She has been feeling off.   She was supposed to see Dr. Sherryll Burger in the city and ended up here in our parking lot 30 minutes early, took 10 minutes to realize and 20 minutes to try and get to the city.  Remains independent for her ADLs.  Reminders for medications and finances.  See  updated medication list below.  No new known stroke, meningitis/encephalitis, RMS Fever, Lupus, Lyme, TBI, sz, etc.  Her anxiety rises easily, especially with all the things she tries to get done so she can be on times to meetings such as these.  She  gets more neurocognitively discombobulated the more anxious she is, and thus has restricted her activities.  She doesn't want to engage in conversations with others  because she loses words or train of thought.        Dx in 2020:   This patient has now undergone three evaluations of neurocognitive status.  The first was with another provider and the last two were here with me.  Previous testing showed  MCI and comparison of current data with previous data shows only a  mild decline in verbal fluency and ongoing problems with attention and focus, working memory and processing speed. She is showing a decline in executive functioning and a decline in  motor skills are also noted.  Thankfully, though, she is not showing a decline in memory.  This latter news is very reassuring.  From an emotional standpoint, she reports a decline in psychiatric status as well, despite engagement with psychiatry and  psychology.  She reports severe depression, severe anxiety, somatization and conversion, and PTSD.  Her anxiety is  severe to the degree whereby her ability to attend and to concentrate is significantly compromised. She also reports psychosis on personality  testing and denied this on interview.  Passive suicidal thinking is reported without current plan or intent.     ??               Thankfully,this profile remains consistent with MCI and is not (yet) dementia.  My concern is for worsening psychiatric functioning impacting her day-to-day cognition, especially her memory.   IF she is psychotic, then of course  this is a new clinical target to address well.  A psychiatric review of her medication management for mood is advised.  I also recommend consideration for an appropriate medication for attention if not medically contraindicated.  Continue active engagement  in psychotherapy.  Consider TMS.  I have some concerns about her competency now, and suggest assignment of a POA if this has not been done so already.  I do not believe she is safe to drive and recommend a formal evaluation of  driving safety.  Supervise/system  should be in place for medications and finances.  Stay active  mentally, physically, and socially.  I would like to see her again as soon as mood improves to better clarify this unusual MCI pattern.  We now have updated data on her.  Clinical correlation  is, of course, indicated.     ??               I will discuss these findings with the patient when she follows up with me in the near future.  A follow up Neuropsychological Evaluation is indicated on a prn basis, especially if there are any cognitive and/or emotional changes.      ??   DIAGNOSES:             MCI                                       Depression, Severe                                       Anxiety, Severe                                       Somatization                                       Conversion                                       PTSD                                       Rule out Psychotic Disorder, NOS                 Since I saw her last: The patient has no new stroke, meningitis/encephalitis, RMS Fever, Lupus, Lyme, TBI, sz.  She has ongoing progressive concerns with short term memory. Forgets the content of conversations Misplaces things. Spouse has to repeat things  over and over.  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  She has problems coming up with words and names. Hard time remembering appointments. Completing sentences is a challenge. No acute or focal issues. No changes in sense  of taste or smell.  She bends over and gets quite dizzy at times. No falls.  Mornings are particularly bad.  She has to put her hands on wall to put her clothes on.  She is on medication to try and help her sleep. It takes her three hours to fully arouse  in the morning.  Appetite - she is always hungry.  She continues to follow with Dr. Tami Ribas for depression/anxiety.  She is frustrated that she is not able to functional as well.  She has a hard time with organization.  She remains independent for driving  (uses GPS), medications (pillbox/reminders), finances, day-to-day chores, ADLs,  etc.  Uncle with Alzheimer's and a  cousin close to her in age who has dementia.        EXAMINATION:  MRI BRAIN WO CONTRAST   ??   COMPARISON:  [facility] 11/28/2019   ??   TECHNIQUE:  Multiplanar multisequence acquisition without contrast of the brain.   ??   ??   FINDINGS:     ??   Ventricles:  Midline, no hydrocephalus.     Brain Parenchyma/Brainstem:  Mild chronic white matter disease throughout the   supratentorial brain. No acute infarction.   Intracranial Hemorrhage:  None.    Basal Cisterns:  Normal.    Flow Voids:  Normal.   Additional Comments:  N/A.   ??   IMPRESSION   Mild chronic white matter disease with no acute infarction.      ??   Neuropsychological Mental Status Exam (NMSE):   Historian: Good   Praxis: No UE apraxia   R/L Orientation: Intact to self and to other   Dress: within normal limits    Weight: within normal limits    Appearance/Hygiene: within normal limits    Gait: within normal limits    Assistive Devices: Glasses   Mood: Mildly depressed   Affect: Mildly flat   Comprehension: within normal limits    Thought Process: within normal limits    Expressive Language: within normal limits    Receptive Language: within normal limits    Motor: ??No cognitive or motor perseveration   ETOH: Denied   Tobacco: Denied   Illicit: Denied   SI/HI: Denied current, but when things were really bad three years or so ago she had passive thoughts. ??No plan, intent, or attempts. ??She has been previously psychiatrically hospitalized at [facility]. ??   Psychosis: Denied   Insight: Within normal limits   Judgment: Within normal limits   Other Psych:   ??           Past Medical History:        Diagnosis  Date         ?  Depression       ?  H/O colonoscopy  09-24-12          diverticulosis         ?  Hepatic steatosis       ?  Hypertension       ?  Mild neurocognitive disorder  10/03/2016          neropsych eval -De BlanchMary Quig.Ph.D with evidence of contributory psychiatric distress         ?  Normal cardiac stress test  02-10-12         ?   Pneumonia               Past Surgical History:         Procedure  Laterality  Date          ?  HX GYN              tubal ligation             Allergies        Allergen  Reactions         ?  Amlodipine  Other (comments)             Loss of memory,nervous,jettery         ?  Losartan  Other (comments)             Nervous,jettery         ?  Nifedipine  Other (comments)             Swelling,palpations             Family History         Problem  Relation  Age of Onset          ?  Heart Disease  Mother            ?  Heart Disease  Father               Social History          Tobacco Use         ?  Smoking status:  Never Smoker         ?  Smokeless tobacco:  Never Used       Substance Use Topics         ?  Alcohol use:  No         ?  Drug use:  No             Current Outpatient Medications          Medication  Sig  Dispense  Refill           ?  hydrALAZINE (APRESOLINE) 10 mg tablet  Take 1 Tab by mouth three (3) times daily.  90 Tab  0     ?  gabapentin (NEURONTIN) 300 mg capsule  Take 300 mg by mouth three (3) times daily.         ?  baclofen (LIORESAL) 10 mg tablet  Take 10 mg by mouth three (3) times daily.         ?  ARIPiprazole (Abilify) 2 mg tablet  Take 2 mg by mouth daily.         ?  prazosin (MINIPRESS) 1 mg capsule  Take 1 mg by mouth nightly.         ?  ibuprofen-famotidine (Duexis) 800-26.6 mg tab  Take 1 Tab by mouth three (3) times daily.         ?  omega-3 acid ethyl esters (LOVAZA) 1 gram capsule  TAKE 1 CAPSULE BY MOUTH EVERY DAY         ?  buPROPion XL (WELLBUTRIN XL) 300 mg XL tablet  Take 300 mg by mouth daily.    2     ?  losartan-hydroCHLOROthiazide (HYZAAR) 100-25 mg per tablet  Take 1 Tab by mouth daily.               ?  escitalopram oxalate (LEXAPRO) 20 mg tablet  Take 20 mg by mouth daily.                  Plan:  Obtain authorization for testing from insurance company.  Report to follow once testing, scoring, and interpretation completed.  ? Organic based neurocognitive issues versus mood  disorder or combination of same.  ? Problems organic, functional,  or both? This note will not be viewable in MyChart.

## 2021-01-17 NOTE — Progress Notes (Signed)
Progress Notes by Annitta Needs, PsyD at 01/17/21 0840                Author: Annitta Needs, PsyD  Service: --  Author Type: Psychologist       Filed: 01/17/21 0857  Encounter Date: 01/17/2021  Status: Signed          Editor: Annitta Needs, PsyD (Psychologist)               No show. No call for initial neuropsych consult

## 2021-01-22 ENCOUNTER — Other Ambulatory Visit (HOSPITAL_BASED_OUTPATIENT_CLINIC_OR_DEPARTMENT_OTHER): Payer: Self-pay

## 2021-01-24 MED FILL — XARELTO 20 MG TABLET: 20 | 30 days supply | Qty: 30 | Fill #2

## 2021-03-01 NOTE — Progress Notes (Signed)
Confirmed testing for may 3

## 2021-03-05 ENCOUNTER — Ambulatory Visit: Attending: Clinical Neuropsychologist | Primary: Family Medicine

## 2021-03-05 ENCOUNTER — Ambulatory Visit: Admit: 2021-03-05 | Payer: MEDICARE | Attending: Clinical Neuropsychologist | Primary: Family Medicine

## 2021-03-05 DIAGNOSIS — G3184 Mild cognitive impairment, so stated: Secondary | ICD-10-CM

## 2021-03-05 NOTE — Progress Notes (Signed)
[facility]  [facility]  NEUROLOGY CLINIC   [address] [place] [address]   [place], [place] [address]   [phone] Office   [phone] Fax      Neuropsychological Evaluation Report     Exam # [id number]    Referral:  Vanessa Right, MD, Dr. Margarette Canada MONTEEN TOOPS is a 65 y.o. [name] handed married African American female who was unaccompanied to the initial clinical interview on 01/17/21 .  Please refer to her medical records for details pertaining to her history.   At the start of the appointment, I reviewed the patient's [facility] Epic Chart (including Media scanned in from previous providers) for the active Problem List, all pertinent Past Medical Hx, medications, recent radiologic and laboratory findings.  In addition, I reviewed pt's documented Immunization Record and Encounter History.     When I saw her last:  Vanessa [name] is a 65 y.o. [name] handed married African American female who was unaccompanied to the initial clinical interview on 12/22/18 .  Please refer to her medical records for details pertaining to her history.    At the start of the appointment, I reviewed the patient's [facility] Epic Chart (including Media scanned in from previous providers) for the active Problem List, all pertinent Past Medical Hx, medications, recent radiologic and laboratory findings. ??In addition, I reviewed pt's documented Immunization Record and Encounter History.  When I saw her last:  ??  Vanessa [name]??is a 65 y.o.??[name] handed married [place] American female who was unaccompanied to the initial clinical interview on 04/16/18 . ??Please refer to her??medical records for details pertaining to her??history. ??Briefly, the patient reported that she completed two years of college without history of previously diagnosed LD and/or receipt of special education services. ??Stopped working June 3 and she has applied for long term disability. ??She has been dealing with a progressive  decline in short term memory. ??Forgets the content of conversations. Misplaces things. Has to write everything down. ??Starts tasks and does not complete. Loses words. ??Loses train of thought. ??Loses two pairs of glasses and three diamond rings in the past two months. ??Slower to learn and comprehend new things. ??No history of stroke, meningitis/encephalitis, RMS Fever, Lupus, Lyme, TBI, sz. ??She gets headaches when she is really tired. ??Her mother had dementia and was showing memory problems in her 95s and died at age 32. ??Patient has not noticed no major changes in her sense of taste or smell. ??She has missed exits when driving and avoids driving at night or with heavy traffic. ??No accidents or near misses. ??She is fairly independent for medications but keeps notes, and same with finances. ??She is independent for her ADLs. ??Sleep is poor, and has been poor for quite some time. ??She does get frequent headaches and is tired a lot which makes the headaches worse, too. Appetite is variable, but has increased. ??She has been on different medications for depression and anxiety for a number of years now and is currently on Wellbutrin and Lexapro. ??She is seeing Dr. Roddie Mc for psychotherapy and continues to engage in same. ??She has noticed no major improvements there and cognition is declining. ??However, the medications do help with panic attacks, which are less frequent now. ??She has having a lot more panic attacks when working (2-3x a week). ??No falls but loses her balance on occasion. ??She is engaged in church activities. ??Marriage is going well and family notices the cognitive decline and are noted. ??  ??  I also reviewed job performance reviews and improvement plans and cognitive issues are noted here and are noted as ongoing and worsening despite attempts and understanding and moves towards improved job performance. ??  ??  Date: ??02/01/2018    Requesting Physician: ??Stacey L. Epps, MD    An EEG is requested in  this 65 year-old woman to evaluate for   epileptiform abnormalities. ??    Medications: ??Medications are listed as Lexapro, Diovan, calcium.   ??    This tracing is obtained during the awake state. ??During   wakefulness, there are brief intermittent runs of   posteriorly-dominant and symmetrical low-to-medium amplitude 9   cycle per second activities which attenuate with eye opening. ??  Lower-voltage faster-frequency activities are seen symmetrically   over the anterior head regions.    Hyperventilation is not performed due to age and medical history.   ??    Intermittent photic stimulation induces symmetric posterior   driving responses. ??    Sleep is not attained. ??    Interpretation: ??This EEG recorded during the awake state is   normal. ??No epileptiform abnormalities are seen.   ??  ??  Result Information   ??  Status: Final result (Exam End: 11/22/2017 17:12) Provider Status: Open   Study Result   ??  EXAM: ??MRI BRAIN W WO CONT  INDICATION: ??mild cognitive impairment, G 31.84, memory loss, blackouts, sharp  pain in shoulders and needles in arms and legs worse on [name] side.  TECHNIQUE: Sagittal and axial T1 weighted images were obtained followed by  intravenous infusion 13 mL Dotarem and axial FLAIR and T-2 weighted images.  Delayed axial and coronal postgadolinium T1-weighted images were obtained as  well as sagittal FLAIR images and axial diffusion weighted images.  COMPARISON: MRI of the head 04/08/13, CT 02/16/13  FINDINGS:  The ventricular size and configuration are normal.   Scattered small foci of T2 hyperintensity again demonstrated in the cerebral  white matter most prominent in the left frontal lobe without significant change  since the MRI 04/08/13. No associated abnormal enhancement or restricted  diffusion.  There is no evidence of intracranial hemorrhage, infarct, mass or abnormal  extra-axial fluid collections.   Flow voids are present in the vertebral, basilar and carotid artery systems. ??  The  craniocervical junction is unremarkable.   The other structures of the cranial base including paranasal sinuses are   unremarkable.  ??  IMPRESSION  IMPRESSION:   1. Chronic nonspecific foci of T2 hyperintensity cerebral white matter without  significant change since 04/08/13.  2. No acute intracranial abnormality demonstrated..  ??   ??  ??  ??  Neuropsych reviewed from 2018. ????This is a lengthy report and history reported is consistent with history reported here. ??Concerns for MCI with mood issues raised and now concern is for a dementia versus pseudodementia. ??  ??  ??  ??  Dx then included:  ??  This patient has now undergone two Neuropsychological Evaluations (previously by another provider in 2017 and the incident evaluation with me). She was previously diagnosed with Mild Cognitive Impairment and there was significant psychiatric overlay. ??The current test results indicate a mild DECLINE in cognitive functioning over time, with significant problems noted in terms of severe deficits in attention (previously moderate) and significant (moderate to severe) decline in visual memory. ??Interestingly, her auditory memory is fully normal, which leads away from a dementia type diagnosis. ??She is also showing borderline range problems with working memory and processing  speed, as well as problems with bilateral motor skills. ??Executive functioning, perceptual reasoning, verbal comprehension, verbal fluency, and confrontation naming remain normal. ??From an emotional standpoint, she reports severe depression and severe anxiety on two brief self-report questionnaires. ??Lengthier assessment of personality functioning could not be further interpreted due to item confusion/comprehension and consistency problems. ??She previously generated a valid PAI which showed depression, anxiety, and PTSD. ??Ongoing PTSD issues are likely.  ??  ????????????????????????While the current profile not consistent with dementia, there is sufficient evidence to suggest a  worsening mild cognitive disorder with significant psychiatric overlay. Consideration for psychiatric medication management for marked attention problems is suggested, along with psychiatric treatment and intensive psychotherapy for severe mood problems. ??Consider EMDR. ??At this point, her problems are worsening, which is showing an increased impact on vocational functioning, especially on any task requiring attention and/or memory, as well as mild issues with motor skills. ??Additionally, stress at work will exacerbate the underlying cognitive deficits. I see no evidence which would suggest malingering ??I suggest short term disability be considered so as for her to fully engage with psychiatric and psychologic treatment, with a hopeful reduction in her cognitive difficulties pending successful psychiatric intervention. ??Her prognosis is currently guarded, especially as a decline is seen over time. ??I find her competent currently, but she needs supervision for medications and finances, and I have concerns for driving safety at this time. ??We now have baseline and updated data on her. ??Follow up in six months or prn, especially if there are any declines. ??Clinical correlation is, of course, indicated. ??  ??  ????????????????????????I will discuss these findings with the patient when she follows up with me in the near future. ??A follow up Neuropsychological Evaluation is indicated on a prn basis, especially if there are any cognitive and/or emotional changes. ??  ??  DIAGNOSES:??????????????????????????????????????????????????Mild Cognitive Impairment (worsening somewhat over time)??  ????????????????????????????????????????????????????????????????????????????????????????????????Major Depression - Severe   ????????????????????????????????????????????????????????????????????????????????????????????????Anxiety Disorder - Severe  ????????????????????????????????????????????????????????????????????????????????????????????????History of PTSD??????????????????????????????????????  ??  Since I saw her last, the patient saw a Clinical Psychologist (Dr. Ginny Forth) and has been working from October 1 to December on  working on cognitive functioning.  She can't remember things.  She has lost her ability to spell every day common words.  She can't complete a sentence of find the [name] words.  She has coordination and motor skills issues.  Her neck blades feel like they are on fire.  She can't put water on her upper back or put on clothes because her skin feels raw.  She is followed by psychiatry.  She is participating in meditation ([employer]), and helps with pain management.  She has had increased falls.   Her memory has been getting worse.  IT was suggest she see ortho due to back pain and knee pain.  Her knees have been buckling.  She feels very jittery. Her skin hurts.  I do note that her EMG, EEG, carotid Doppler, MRI of the cervical spine, were normal.  Considering rheumatology. Showing up to wrong places when driving.  She has been feeling off.  She was supposed to see Dr. Sherryll Burger in the city and ended up here in our parking lot 30 minutes early, took 10 minutes to realize and 20 minutes to try and get to the city.  Remains independent for her ADLs.  Reminders for medications and finances.  See updated medication list below.  No new known stroke, meningitis/encephalitis, RMS Fever, Lupus, Lyme, TBI, sz, etc.  Her anxiety rises easily,  especially with all the things she tries to get done so she can be on times to meetings such as these.  She gets more neurocognitively discombobulated the more anxious she is, and thus has restricted her activities.  She doesn't want to engage in conversations with others because she loses words or train of thought.      Dx in 2020:  This patient has now undergone three evaluations of neurocognitive status.  The first was with another provider and the last two were here with me.  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  She is showing a decline in executive functioning and a decline in motor skills are also noted.  Thankfully, though, she is not showing a decline in memory.  This latter news is very reassuring.  From an emotional standpoint, she reports a decline in psychiatric status as well, despite engagement with psychiatry and psychology.  She reports severe depression, severe anxiety, somatization and conversion, and PTSD.  Her anxiety is  severe to the degree whereby her ability to attend and to concentrate is significantly compromised. She also reports psychosis on personality testing and denied this on interview.  Passive suicidal thinking is reported without current plan or intent.    ??              Thankfully,this profile remains consistent with MCI and is not (yet) dementia.  My concern is for worsening psychiatric functioning impacting her day-to-day cognition, especially her memory.   IF she is psychotic, then of course this is a new clinical target to address well.  A psychiatric review of her medication management for mood is advised.  I also recommend consideration for an appropriate medication for attention if not medically contraindicated.  Continue active engagement in psychotherapy.  Consider TMS.  I have some concerns about her competency now, and suggest assignment of a POA if this has not been done so already.  I do not believe she is safe to drive and recommend a formal evaluation of driving safety.  Supervise/system should be in place for medications and finances.  Stay active mentally, physically, and socially.  I would like to see her again as soon as mood improves to better clarify this unusual MCI pattern.  We now have updated data on her.  Clinical correlation is, of course, indicated.    ??              I will discuss these findings with the patient when she follows up with me in the near future.  A follow up Neuropsychological Evaluation is indicated on a prn basis, especially if there are any  cognitive and/or emotional changes.    ??  DIAGNOSES:             MCI                                      Depression, Severe                                      Anxiety, Severe  Somatization                                      Conversion                                      PTSD                                      Rule out Psychotic Disorder, NOS             Since I saw her last: The patient has no new stroke, meningitis/encephalitis, RMS Fever, Lupus, Lyme, TBI, sz.  She has ongoing progressive concerns with short term memory. Forgets the content of conversations Misplaces things. Spouse has to repeat things over and over.  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  She has problems coming up with words and names. Hard time remembering appointments. Completing sentences is a challenge. No acute or focal issues. No changes in sense of taste or smell.  She bends over and gets quite dizzy at times. No falls.  Mornings are particularly bad.  She has to put her hands on wall to put her clothes on.  She is on medication to try and help her sleep. It takes her three hours to fully arouse in the morning.  Appetite - she is always hungry.  She continues to follow with Dr. Tami Ribas for depression/anxiety.  She is frustrated that she is not able to functional as well.  She has a hard time with organization.  She remains independent for driving (uses GPS), medications (pillbox/reminders), finances, day-to-day chores, ADLs, etc.  Uncle with Alzheimer's and a cousin close to her in age who has dementia.      EXAMINATION:  MRI BRAIN WO CONTRAST  ??  COMPARISON:  [facility] 11/28/2019  ??  TECHNIQUE:  Multiplanar multisequence acquisition without contrast of the brain.  ??  ??  FINDINGS:    ??  Ventricles:  Midline, no hydrocephalus.    Brain Parenchyma/Brainstem:  Mild chronic white matter disease throughout the  supratentorial brain. No acute infarction.  Intracranial Hemorrhage:   None.   Basal Cisterns:  Normal.   Flow Voids:  Normal.  Additional Comments:  N/A.  ??  IMPRESSION  Mild chronic white matter disease with no acute infarction.    ??  Neuropsychological Mental Status Exam (NMSE):  Historian: Good  Praxis: No UE apraxia  R/L Orientation: Intact to self and to other  Dress: within normal limits   Weight: within normal limits   Appearance/Hygiene: within normal limits   Gait: within normal limits   Assistive Devices: Glasses  Mood: Mildly depressed  Affect: Mildly flat  Comprehension: within normal limits   Thought Process: within normal limits   Expressive Language: within normal limits   Receptive Language: within normal limits   Motor: ??No cognitive or motor perseveration  ETOH: Denied  Tobacco: Denied  Illicit: Denied  SI/HI: Denied current, but when things were really bad three years or so ago she had passive thoughts. ??No plan, intent, or attempts. ??She has been previously psychiat[facility] at [facility] ??  Psychosis: Denied  Insight: Within normal limits  Judgment: Within normal limits  Other  Psych:  ??      Past Medical History:   Diagnosis Date   ??? Depression    ??? H/O colonoscopy 09-24-12    diverticulosis   ??? Hepatic steatosis    ??? Hypertension    ??? 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 Normal cardiac stress test 02-10-12   ??? Pneumonia        Past Surgical History:   Procedure Laterality Date   ??? HX GYN      tubal ligation       Allergies   Allergen Reactions   ??? Amlodipine Other (comments)     Loss of memory,nervous,jettery   ??? Losartan Other (comments)     Nervous,jettery   ??? Nifedipine Other (comments)     Swelling,palpations       Family History   Problem Relation Age of Onset   ??? Heart Disease Mother    ??? Heart Disease Father        Social History     Tobacco Use   ??? Smoking status: Never Smoker   ??? Smokeless tobacco: Never Used   Substance Use Topics   ??? Alcohol use: No    ??? Drug use: No       Current Outpatient Medications   Medication Sig Dispense Refill   ??? hydrALAZINE (APRESOLINE) 10 mg tablet Take 1 Tab by mouth three (3) times daily. 90 Tab 0   ??? gabapentin (NEURONTIN) 300 mg capsule Take 300 mg by mouth three (3) times daily.     ??? baclofen (LIORESAL) 10 mg tablet Take 10 mg by mouth three (3) times daily.     ??? ARIPiprazole (Abilify) 2 mg tablet Take 2 mg by mouth daily.     ??? prazosin (MINIPRESS) 1 mg capsule Take 1 mg by mouth nightly.     ??? ibuprofen-famotidine (Duexis) 800-26.6 mg tab Take 1 Tab by mouth three (3) times daily.     ??? omega-3 acid ethyl esters (LOVAZA) 1 gram capsule TAKE 1 CAPSULE BY MOUTH EVERY DAY     ??? buPROPion XL (WELLBUTRIN XL) 300 mg XL tablet Take 300 mg by mouth daily.  2   ??? losartan-hydroCHLOROthiazide (HYZAAR) 100-25 mg per tablet Take 1 Tab by mouth daily.     ??? escitalopram oxalate (LEXAPRO) 20 mg tablet Take 20 mg by mouth daily.           Plan:  Obtain authorization for testing from insurance company.  Report to follow once testing, scoring, and interpretation completed.  ? Organic based neurocognitive issues versus mood disorder or combination of same.  ? Problems organic, functional, or both? This note will not be viewable in MyChart.      Neuropsychological Evaluation  Patient Testing 03/05/21 Report Completed 03/11/21  A Psychometrist Assisted w/ portions of this evaluation while under my direct supervision    Please refer to the patient's initial interview progress note (copied above) and her medical records for details pertaining to her history.  Today's neuropsychological test scores follow:    The following assessment procedures were performed:      Neuropsychologist Performed, Interpreted, & Reported: Neuropsychological Mental Status Exam, Revised Memory & Behavior Checklist, Mini Mental State Exam, Clock Drawing Test, Test Of Premorbid Functioning, McGill-Melzack Pain Questionnaire,  History Taking  & Clinical Interview With The  Patient, PAI, CPT-III, Review Of Available Records.    Psychometrist Administered & Neuropsychologist Interpreted & Neuropsychologist  Reported: Chief Financial Officer, Grooved Pegboard Test, CATA, , Wechsler Adult Intelligence Scale ??? IV, Verbal Fluency Tests, [employer] ??? Revised, Trailmaking Test Parts A & B, [place] Verbal Learning Test ??? 3, Rey Complex Figure Test, Beck Depression Inventory ??? II, Beck Anxiety Inventory,.      Test Findings:  Note:  The patient???s raw data have been compared with currently available norms which include demographic corrections for age, gender, and/or education.  Sometimes, the patient???s scores are compared to demographically similar individuals as close to the patient???s age, education level, etc., as possible.  "Average" is viewed as being +/- 1 standard deviation (SD) from the stated mean for a particular test score.  "Low average" is viewed as being between 1 and 2 SD below the mean, and above average is viewed as being 1 and 2 SD above the mean.  Scores falling in the ???borderline??? range (between 1-1/2 and 2 SD below the mean) are viewed with particular attention as to whether they are normal or abnormal neurocognitive test scores.  Other methods of inference in analyzing the test data are also utilized, including the pattern and range of scores in the profile, bilateral motor functions, and the presence, if any, of pathognomonic signs.      Behaviorally, the patient was friendly and cooperative and appeared motivated to perform well during this examination.  Within this context, the results of this evaluation are viewed as a valid reflection of the patient???s actual neurocognitive and emotional status.         Her structured word list fluency, as assessed by the FAS Test, was within the mildly impaired range with a T score of 31.  Category fluency was within the below average range with a T score of 42.  Confrontation naming, as assessed by the [facility] as  revised, was within the average range with a T score of 47.  This pattern of performance is indicative of a patient who is at increased risk for day-to-day problems with verbal fluency and confrontation naming was normal.  There are no changes in functioning over time.                The patient was administered the Conners??? Continuous Performance Test ??? III, a computer-administered test of sustained attention, and review of the subscales within this instrument revealed mild to moderate concern for inattentiveness without additional concern for impulsivity.    Auditory attention, as assessed by the C ATA, was normal.  This pattern of performance is  indicative of a patient who is at increased risk for day-to-day problems with sustained visual attention/concentration.    Mild improvement from previous testing.  ??              The patient was administered the Wechsler Adult Intelligence Scale ?????IV. ??See Appendix I for full breakdown of IQ test scores (scanned into media section of this EMR). ??As can be seen, there was no clinically significant difference between her  borderline range Working Memory Index score of 74 (4th %ile) and her low average range??Processing Speed Index score of??81 (10th??%ile). ??Her Verbal Comprehension Index score of 87 (19th %ile) was within the low average range. ??Her Perceptual Reasoning Index score of 90 (25th %ile) was within the average range.????T there is a trend of improvement here in scores, as with the exception of working memory, or where they would be expected to be based on her performance on a task estimating premorbid functioning levels.  The low  working memory issues suggests functional interference.  ??              The patient was administered the [place] Verbal Learning Test  - 3 and generated a normal range and positive learning curve over five repeated auditory word list learning trials.  An interference trial was within normal limits.  Free and cued, short and long delayed  recall were within the normal range.  Recognition recall was borderline.  Forced choice recall as normal, suggesting good effort on this test.  This pattern of performance is not indicative of a patient who is at increased risk for day-to-day problems with auditory learning and/or memory.  No change from previous testing.    ??              The patient???s performance on the copy portion of the Rey Complex Figure Test was within normal limits  (>16th %ile).  Recall for the complex design was within the normal range after a short delay, impaired after a long delay, and normal with respect to recognition recall.  This is generally consistent with previous testing and no major declines in visual memory are noted over time.    ??              Simple timed visual motor sequencing (Trailmaking Test Part A) was within the below average range with a T score of 42.  Her performance on a similar, but more complex task of timed visual motor sequencing (Trailmaking Test Part B) was within the below average range with a T score of 43.  She made 0 sequencing errors on this latter completed test.  Taken together, this pattern of performance is not indicative of a patient who is at increased risk for day-to-day problems with executive functioning.  This is a remarkable improvement especially in speed over time.               Grip strength was severely impaired bilaterally.  Fine motor dexterity was normal bilaterally.  This again does not raise concern for particularly focal or lateralized brain dysfunction.  ??              The patient rated her current level of pain as "2/5- Discomforting" on the McGill-Melzack Pain Questionnaire.  She reported pain in her in her left shoulder.  She also reported a discomforting level of pain previously.                Her Beck Depression Inventory ???II score of  19 was within the mildly depressed range.    This is a marked improvement in depression over time.  Her Beck Anxiety Inventory score of 12  reflected mild anxiety.    This is a marked improvement over time.  ??              The patient was also administered the Personality Assessment Inventory and generated a valid profile for interpretation.  Within this context, there are numerous clinical scale elevations.    She again reports psychosis on this measure.  She is a fairly rigid individual who follows her personal guidelines for conduct in an inflexible and unyielding manner.  She will ruminate about matters to the degree that she often has difficulties making decisions or perceiving the larger significance of those decisions that are made.  Changes in routine are likely to generate untoward stress.  There is again support for PTSD.  There is again support for somatization.  Marked anxiety and  depression issues are also present, which tend to manifest somatically.  She can be meek and unassertive at times.  She is highly motivated for treatment.   ??  Impressions & Recommendations:  This patient has now undergone four evaluations of neurocognitive status.    Comparison of current with previous testing showed no evidence of a decline in functioning over time.  In fact, many of her scores over improved to some degree.  She continues to fit the criteria of MCI, though.  The most salient deficit would be in the domain of sustained attention.  Working memory remains low, which suggests ongoing functional issues, and she again reports psychosis, depression, anxiety, somatization and conversion, and PTSD.  She again reports psychosis on personality testing and again denied this on interview.  She reports passive suicidal thinking without active plan or intent.       Thankfully, the profile maintains that of a MCI category.  Mood issues appear to be improving slowly but remains significant issues.  I recommend ongoing psychiatric treatment and counseling/behavioral therapy/support.  I again recommend, in addition to psychiatric interventions, consideration for  appropriate medication if this is not medically contraindicated.  I am no longer concerned about competency/capacity.  I wish her well.  The patient should be encouraged to remain as mentally, physically, and socially active as possible.  Not concerned about day-to-day supervision, either.  We have extensive baseline and updated neurocognitive data and psychologic data on her.  Follow-up as needed.  Clinical correlation is, of course, indicated.                I will discuss these findings with the patient when she follows up with me in the near future.  A follow up Neuropsychological Evaluation is indicated on a prn basis, especially if there are any cognitive and/or emotional changes.    ??  DIAGNOSES:             MCI                                      Depression, Moderate (improvement)                                       Anxiety, (improvement)                                       Somatization                                      Conversion                                      PTSD                                      Rule out Psychotic Disorder, NOS          The above information is based upon information currently available to me.  If there is any additional information of  which I am currently unaware, I would be more than happy to review it upon having it made available to me.  Thank you for the opportunity to see this interesting individual.     Sincerely,       Elder Davidian A. Wynonia Hazard, PsyD, EdS    CC: Vanessa Right, MD , Dr. Tami Ribas    Time Documentation:      [phone] x1 &  [phone] x 1 Neuropsych testing/data gathering by Neuropsychologist (60 minutes)     96138 x 1  96139 x 7 Test Administration/Data Gathering By Technician: (4 hours). 29562 x 1 (first 30 minutes), 96138 x 7 (each additional 30 minutes)    96132 x 1  96133 x 1 Testing Evaluation Services by Neuropsychologist (1 hour, 50 minutes) 96132 x 1 (first hour), 96133 x 1 (50 minutes)    Definitions:      96116/96121:  Neurobehavioral Status Exam,  Clinical interview.  Clinical assessment of thinking, reasoning and judgment, by neuropsychologist, both face to face time with patient and time interpreting those test results and reporting, first and subsequent hours)    96138/96139: Neuropsychological Test Administration by Technician/Psychometrist, first 30 minutes and each additional 30 minutes.     The above includes: Record review.  Review of history provided by patient.  Review of collaborative information.  Testing by Clinician.  Review of raw data. Scoring. Report writing of individual tests administered by Clinician.  Integration of individual tests administered by psychometrist with NSE/testing by clinician, review of records/history/collaborative information, case Conceptualization, treatment planning, clinical decision making, report writing, coordination Of Care. Psychometry test codes as time spent by psychometrist administering and scoring neurocognitive/psychological tests under supervision of neuropsychologist.  Integral services including scoring of raw data, data interpretation, case conceptualization, report writing etcetera were initiated after the patient finished testing/raw data collected and was completed on the date the report was signed.

## 2021-03-21 ENCOUNTER — Other Ambulatory Visit (HOSPITAL_COMMUNITY): Payer: Self-pay

## 2021-03-21 ENCOUNTER — Other Ambulatory Visit: Payer: Self-pay | Admitting: *Deleted

## 2021-03-21 ENCOUNTER — Telehealth: Payer: Self-pay | Admitting: Neurology

## 2021-03-21 MED FILL — Atorvastatin Calcium Tab 40 MG (Base Equivalent): ORAL | 90 days supply | Qty: 90 | Fill #0 | Status: AC

## 2021-03-21 MED FILL — Rivaroxaban Tab 20 MG: ORAL | 30 days supply | Qty: 30 | Fill #0 | Status: AC

## 2021-03-21 NOTE — Telephone Encounter (Deleted)
error 

## 2021-03-21 NOTE — Telephone Encounter (Signed)
Pt called and is requesting to have a medication refilled that helps with her hand. She states that it starts with a "P" and that she cannot remember the name. Please advise  Patient called and is requesting gabapentin 300 mg to be added to medication refills. appt has been scheduled for 05/17/21. Patient verbalized understanding medications will be submitted for refill and her pharmacy will contact her when ready for pick. Patient aware if any other concerns , she will be contacted.

## 2021-03-21 NOTE — Telephone Encounter (Signed)
Requested medication (s) are due for refill today: Yes  Requested medication (s) are on the active medication list: Yes  Last refill:  09/23/19  Future visit scheduled: Yes  Notes to clinic:  Prescription expired. Pt. Asking for a medication refill for her "hand, starts with a p."    Requested Prescriptions  Pending Prescriptions Disp Refills   gabapentin (NEURONTIN) 300 MG capsule 270 capsule 3    Sig: Take 1 tablet in the morning and 2 tablet at bedtime.      Neurology: Anticonvulsants - gabapentin Failed - 03/21/2021  1:24 PM      Failed - Valid encounter within last 12 months    Recent Outpatient Visits           2 years ago Chest pain in adult   [place], [place]   2 years ago Essential hypertension   [place], [place]   2 years ago Generalized anxiety disorder   [place], MD   3 years ago Dyslipidemia   [place], [name] E, MD   3 years ago Dyslipidemia   [place], MD       Future Appointments             In 1 month Wynetta Emery Dalbert Batman, MD [place]

## 2021-03-22 ENCOUNTER — Other Ambulatory Visit (HOSPITAL_COMMUNITY): Payer: Self-pay

## 2021-04-09 ENCOUNTER — Other Ambulatory Visit (HOSPITAL_COMMUNITY): Payer: Self-pay

## 2021-04-12 ENCOUNTER — Ambulatory Visit: Attending: Clinical Neuropsychologist | Primary: Family Medicine

## 2021-04-12 ENCOUNTER — Ambulatory Visit: Admit: 2021-04-12 | Payer: MEDICARE | Attending: Clinical Neuropsychologist | Primary: Family Medicine

## 2021-04-12 DIAGNOSIS — G3184 Mild cognitive impairment, so stated: Secondary | ICD-10-CM

## 2021-04-12 NOTE — Progress Notes (Signed)
Prior to seeing the patient I reviewed the records, including the previously completed report, the records in [place], and any updated visits from other providers since I saw the patient last.      Today, I engaged in a psychoeducational and supportive and cognitive/behavioral psychotherapy session with the patient.  I provided psychotherapy in the form of psychoeducation and support with respect to the results of the recent Neuropsychological Evaluation, including discussing individual tests as well as patient's areas of neurocognitive strength versus weakness.    We discussed, in detail, the following:      ??This patient has now undergone four evaluations of neurocognitive status. ??  Comparison of current with previous testing showed no evidence of a decline in functioning over time.  In fact, many of her scores over improved to some degree.  She continues to fit the criteria of MCI, though.  The most salient deficit would be in the domain of sustained attention.  Working memory remains low, which suggests ongoing functional issues, and she again reports psychosis, depression, anxiety, somatization and conversion, and PTSD.  She again reports psychosis on personality testing and again denied this on interview.  She reports passive suicidal thinking without active plan or intent.    ??              Thankfully, the profile maintains that of a MCI category.  Mood issues appear to be improving slowly but remains significant issues.  I recommend ongoing psychiatric treatment and counseling/behavioral therapy/support.  I again recommend, in addition to psychiatric interventions, consideration for appropriate medication if this is not medically contraindicated.  I am no longer concerned about competency/capacity.  I wish her well.  The patient should be encouraged to remain as mentally, physically, and socially active as possible.  Not concerned about day-to-day supervision, either.  We have extensive baseline and  updated neurocognitive data and psychologic data on her.  Follow-up as needed.  Clinical correlation is, of course, indicated.  ??  ????????????????????????I will discuss these findings with the patient when she follows up with me in the near future. ??A follow up Neuropsychological Evaluation is indicated on a prn basis, especially if there are any cognitive and/or emotional changes. ??  ??  DIAGNOSES:??????????????????????????MCI  ????????????????????????????????????????????????????????????????????????Depression, Moderate (improvement)   ????????????????????????????????????????????????????????????????????????Anxiety, (improvement)   ????????????????????????????????????????????????????????????????????????Somatization  ????????????????????????????????????????????????????????????????????????Conversion  ????????????????????????????????????????????????????????????????????????PTSD  ????????????????????????????????????????????????????????????????????????Rule out Psychotic Disorder, NOS??????????  ??    Education was provided regarding my diagnostic impressions, and we discussed treatment plan/options.   I also answered numerous questions related to the clinical findings, including discussing various methods to improve cognition and mood.  Counseling provided regarding mood and cognition.   CBT and supportive psychotherapy techniques were utilized.  Supportive/Cognitive Behavioral/Solution Focused psychotherapy provided  Discussed rational versus irrational thinking patterns and their consequences.Discussed healthy/adaptive and unhealthy/maladaptive coping.      The patient needs to follow with psychology but hard to find a therapist new [place].  Discussed nline therapy.  No dementia.     The patient had the following concerns which I deferred to their referring provider: meds for mood/cognition      Time spent today: 20

## 2021-04-25 ENCOUNTER — Other Ambulatory Visit (HOSPITAL_COMMUNITY): Payer: Self-pay

## 2021-04-25 ENCOUNTER — Other Ambulatory Visit: Payer: Self-pay

## 2021-04-25 ENCOUNTER — Telehealth (INDEPENDENT_AMBULATORY_CARE_PROVIDER_SITE_OTHER): Payer: Medicaid Other | Admitting: Neurology

## 2021-04-25 VITALS — Ht 62.0 in | Wt 186.0 lb

## 2021-04-25 DIAGNOSIS — G6181 Chronic inflammatory demyelinating polyneuritis: Secondary | ICD-10-CM

## 2021-04-25 MED ORDER — GABAPENTIN 300 MG PO CAPS
ORAL_CAPSULE | ORAL | 3 refills | Status: DC
  Filled 2021-04-25: qty 270, 90d supply, fill #0
  Filled 2021-09-05: qty 270, 90d supply, fill #1
  Filled 2021-12-06: qty 270, 90d supply, fill #2
  Filled 2022-04-15: qty 270, 90d supply, fill #3

## 2021-04-25 MED FILL — Rivaroxaban Tab 20 MG: ORAL | 30 days supply | Qty: 30 | Fill #1 | Status: AC

## 2021-04-25 NOTE — Progress Notes (Signed)
   Virtual Visit via Video Note The purpose of this virtual visit is to provide medical care while limiting exposure to the novel coronavirus.    Consent was obtained for video visit:  Yes.   Answered questions that patient had about telehealth interaction:  Yes.   I discussed the limitations, risks, security and privacy concerns of performing an evaluation and management service by telemedicine. I also discussed with the patient that there may be a patient responsible charge related to this service. The patient expressed understanding and agreed to proceed.  Pt location: Home Physician Location: office Name of referring provider:  [name], MD I connected with Tammy [name] at patients initiation/request on 04/25/2021 at  8:50 AM EDT by video enabled telemedicine application and verified that I am speaking with the correct person using two identifiers. Pt MRN:  [id number] Pt DOB:  September 13, 1956 Video Participants:  Tammy [name]   History of Present Illness: This is a 65 y.o. female returning for follow-up of CIDP.  She was last seen in 2020 at which time repeat EDX was performed and showed progressive neuropathy in the hands.  She continues to get IVIG every 21 days, which helps her pain, tingling, and hand strength.  She was taking gabapentin 300mg  in the morning and 600mg  at bedtime, which significantly improved her pain. However, she has been out of this.  She was overdue for follow-up visit. She denies any worsening neuropathy, falls, or weakness.       Observations/Objective:   Vitals:   04/25/21 0756  Weight: 186 lb (84.4 kg)  Height: 5\' 2"  (1.575 m)   Patient is awake, alert, and appears comfortable.  Oriented x 4.   Extraocular muscles are intact. No ptosis.  Face is symmetric.  Speech is not dysarthric. Tongue is midline. Antigravity in all extremities.  No pronator drift. Gait appears normal, unassisted.  DATA: NCS/EMG of the arms 10/18/2019: The electrophysiologic  findings are consistent with a severe active and chronic polyradiculoneuropathy affecting the upper extremities, which has worsened from study on 10/15/2017.  Assessment and Plan:  Chronic inflammatory demyelinating polyradiculoneuropathy, diagnosed 10/2017, affecting the upper extremities, supported by EDX and CSF findings.  She was briefly on IVMP.  In 07/2018, she was started on IVIG which has helped her paresthesias.  Repeat EMG from 2020 reviewed and shows progressive neuropathy, so will keep on IVIG.  - Continue IVIG 1gkg every 21 days which seems to stabilize symptoms  - Will need to consider adding imuran/cellcept going forward to taper IVIG  - Prescription provided for gabapentin 300mg  in the morning and 600mg  at bedtime.   Follow Up Instructions:   I discussed the assessment and treatment plan with the patient. The patient was provided an opportunity to ask questions and all were answered. The patient agreed with the plan and demonstrated an understanding of the instructions.   The patient was advised to call back or seek an in-person evaluation if the symptoms worsen or if the condition fails to improve as anticipated.  Follow-up in 6 months  Alda Berthold, DO

## 2021-04-30 ENCOUNTER — Other Ambulatory Visit (HOSPITAL_COMMUNITY): Payer: Self-pay

## 2021-05-02 ENCOUNTER — Other Ambulatory Visit (HOSPITAL_COMMUNITY): Payer: Self-pay

## 2021-05-03 ENCOUNTER — Other Ambulatory Visit (HOSPITAL_COMMUNITY): Payer: Self-pay

## 2021-05-17 ENCOUNTER — Ambulatory Visit: Payer: Medicaid Other | Admitting: Internal Medicine

## 2021-05-31 NOTE — Telephone Encounter (Signed)
Formatting of this note might be different from the original.  Patient calling to see if paperwork forms for disability was received today. She faxed it this morning.    Please call.   Electronically signed by Mortimer Fries at 05/31/2021 12:02 PM EDT

## 2021-05-31 NOTE — Telephone Encounter (Signed)
Formatting of this note might be different from the original.  Spoke with Ms. Takhar let her know we have received her paperwork and I'll give her a call once forms have been completed.  Electronically signed by Laurina Bustle at 05/31/2021  2:15 PM EDT

## 2021-05-31 NOTE — Telephone Encounter (Signed)
Patient calling to see if paperwork forms for disability was received today. She faxed it this morning.    Please call.

## 2021-05-31 NOTE — Telephone Encounter (Signed)
Spoke with Vanessa [name] let her know we have received her paperwork and I'll give her a call once forms have been completed.

## 2021-06-20 ENCOUNTER — Other Ambulatory Visit (HOSPITAL_COMMUNITY): Payer: Self-pay

## 2021-06-20 ENCOUNTER — Other Ambulatory Visit: Payer: Self-pay

## 2021-06-20 ENCOUNTER — Ambulatory Visit: Payer: Medicaid Other | Attending: Internal Medicine | Admitting: Internal Medicine

## 2021-06-20 VITALS — BP 147/81 | HR 95 | Resp 16 | Ht 62.0 in | Wt 180.6 lb

## 2021-06-20 DIAGNOSIS — Z2821 Immunization not carried out because of patient refusal: Secondary | ICD-10-CM | POA: Diagnosis not present

## 2021-06-20 DIAGNOSIS — I129 Hypertensive chronic kidney disease with stage 1 through stage 4 chronic kidney disease, or unspecified chronic kidney disease: Secondary | ICD-10-CM | POA: Diagnosis present

## 2021-06-20 DIAGNOSIS — N183 Chronic kidney disease, stage 3 unspecified: Secondary | ICD-10-CM | POA: Insufficient documentation

## 2021-06-20 DIAGNOSIS — E785 Hyperlipidemia, unspecified: Secondary | ICD-10-CM | POA: Diagnosis not present

## 2021-06-20 DIAGNOSIS — Z28310 Unvaccinated for covid-19: Secondary | ICD-10-CM | POA: Insufficient documentation

## 2021-06-20 DIAGNOSIS — Z1231 Encounter for screening mammogram for malignant neoplasm of breast: Secondary | ICD-10-CM | POA: Diagnosis not present

## 2021-06-20 DIAGNOSIS — D6859 Other primary thrombophilia: Secondary | ICD-10-CM | POA: Diagnosis not present

## 2021-06-20 DIAGNOSIS — R1012 Left upper quadrant pain: Secondary | ICD-10-CM | POA: Diagnosis not present

## 2021-06-20 DIAGNOSIS — G6181 Chronic inflammatory demyelinating polyneuritis: Secondary | ICD-10-CM | POA: Diagnosis not present

## 2021-06-20 DIAGNOSIS — Z7689 Persons encountering health services in other specified circumstances: Secondary | ICD-10-CM | POA: Diagnosis not present

## 2021-06-20 DIAGNOSIS — I10 Essential (primary) hypertension: Secondary | ICD-10-CM

## 2021-06-20 DIAGNOSIS — F17210 Nicotine dependence, cigarettes, uncomplicated: Secondary | ICD-10-CM | POA: Insufficient documentation

## 2021-06-20 DIAGNOSIS — Z86711 Personal history of pulmonary embolism: Secondary | ICD-10-CM | POA: Diagnosis not present

## 2021-06-20 DIAGNOSIS — Z86718 Personal history of other venous thrombosis and embolism: Secondary | ICD-10-CM | POA: Insufficient documentation

## 2021-06-20 DIAGNOSIS — Z7901 Long term (current) use of anticoagulants: Secondary | ICD-10-CM | POA: Diagnosis not present

## 2021-06-20 DIAGNOSIS — Z79899 Other long term (current) drug therapy: Secondary | ICD-10-CM | POA: Insufficient documentation

## 2021-06-20 DIAGNOSIS — Z8249 Family history of ischemic heart disease and other diseases of the circulatory system: Secondary | ICD-10-CM | POA: Insufficient documentation

## 2021-06-20 DIAGNOSIS — Z1211 Encounter for screening for malignant neoplasm of colon: Secondary | ICD-10-CM | POA: Diagnosis not present

## 2021-06-20 MED ORDER — AMLODIPINE BESYLATE 5 MG PO TABS
5.0000 mg | ORAL_TABLET | Freq: Every day | ORAL | 3 refills | Status: DC
  Filled 2021-06-20: qty 30, 30d supply, fill #0
  Filled 2021-09-05: qty 30, 30d supply, fill #1
  Filled 2021-10-07: qty 30, 30d supply, fill #2
  Filled 2021-12-06: qty 30, 30d supply, fill #3

## 2021-06-20 NOTE — Progress Notes (Signed)
Patient ID: ALIANA [name], female    DOB: [birth year]  MRN: [id number]  CC: New Patient (Initial Visit) and Hypertension   Subjective: Tammy [name] is a 65 y.o. female who presents for new pt/re-est care. 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  She tells me she has not come in because she was feeling well  HTN: when last seen, I had placed her on Norvasc.  Out for a while. Checking BP once a wk -sometimes high and sometimes low but does not have specifics.  She tells me that a nurse comes out to her house once every 2 weeks to give steroid injection due to her diagnosis of CIDP.  The nurse always checks her blood pressure and she is told that the blood pressure is high.  -limits salt in foods No CP /SOB.  Occasional swelling in the legs  Reports intermittent pain in the left mid quadrant of the abdomen that occurs intermittently over the past few months.  Last episode was about a month ago.  No associated nausea or vomiting.  No associated diarrhea.  No blood in the stools.  She is moving her bowels okay.  She has never done a colonoscopy for colon cancer screening.    HL: tolerating Lipitor.  Taking consistently.  DVT/PE: She is on lifelong anticoagulation.  Currently on Xarelto.  Denies any bruising or bleeding on the medication.  She sees Dr. Alvy Bimler about once a year.  She was last seen December of last year.  CIDP:  sees Dr. Posey Pronto and was last seen in June of this year.  She is on IVIG every 3 weeks and is stable on this.  She is also on gabapentin.   HM: due for MMG, PAP due 05/2022.  Declines COVID vaccine, Shingles and Pneumonia vaccines Patient Active Problem List   Diagnosis Date Noted   Preventive measure 10/05/2020   CKD (chronic kidney disease) stage 3, GFR 30-59 ml/min (HCC) 10/06/2019   Essential hypertension  03/14/2019   Hyperlipidemia 03/14/2019   Precordial chest pain 03/14/2019   Former tobacco use 02/08/2019   CIDP (chronic inflammatory demyelinating polyneuropathy) ([place]) 01/12/2018   Positive ANA (antinuclear antibody) 12/24/2015   Generalized anxiety disorder 12/24/2015   Dyslipidemia 10/09/2014   Cervical spondylolysis 10/09/2014   DVT (deep venous thrombosis) ([place]) 08/22/2013   LGSIL (low grade squamous intraepithelial lesion) on Pap smear 08/04/2013   Sleep apnea 05/21/2011     Current Outpatient Medications on File Prior to Visit  Medication Sig Dispense Refill   atorvastatin (LIPITOR) 40 MG tablet TAKE 1 TABLET BY MOUTH ONCE DAILY 90 tablet 3   DULoxetine (CYMBALTA) 30 MG capsule Take 1 capsule (30 mg total) by mouth daily. 30 capsule 3   gabapentin (NEURONTIN) 300 MG capsule Take 1 capsule by mouth in the morning and take 2 capsules at bedtime. 270 capsule 3   rivaroxaban (XARELTO) 20 MG TABS tablet TAKE 1 TABLET BY MOUTH ONCE A DAY 30 tablet 11   clobetasol cream (TEMOVATE) [id number] % Apply 1 application topically 2 (two) times daily. (Patient not taking: No sig reported) 60 g 3   Pediatric Multivit-Minerals-C ([employer] GUMMY MULTIVITAMIN KIDS) CHEW Chew by mouth. (Patient not taking: Reported on 04/25/2021)     Current Facility-Administered Medications on File Prior to Visit  Medication Dose Route Frequency  Provider Last Rate Last Admin   methylPREDNISolone sodium succinate (SOLU-MEDROL) 1,000 mg in sodium chloride 0.9 % 50 mL IVPB  1,000 mg Intravenous Q28 days Alda Berthold, DO 58 mL/hr at 01/27/18 0928 1,000 mg at 01/27/18 [id number]    No Known Allergies  Social History   Socioeconomic History   Marital status: Single    Spouse name: Not on file   Number of children: 4   Years of education: 12   Highest education level: Not on file  Occupational History   Occupation: unemployed  Tobacco Use   Smoking status: Light Smoker    Packs/day: 0.00    Years: 40.00    Pack years:  0.00    Types: Cigarettes   Smokeless tobacco: Never   Tobacco comments:    Patient reports smoking 2-3 cigarettes a day   Vaping Use   Vaping Use: Never used  Substance and Sexual Activity   Alcohol use: No   Drug use: No   Sexual activity: Yes  Other Topics Concern   Not on file  Social History Narrative   Lives alone in a one story home.  Has 4 children.  Works as a Education officer, environmental at [employer].  Education: GED   Recent travel to [place] 01/2011   Right handed   Social Determinants of Health   Financial Resource Strain: Not on file  Food Insecurity: Not on file  Transportation Needs: Not on file  Physical Activity: Not on file  Stress: Not on file  Social Connections: Not on file  Intimate Partner Violence: Not on file    Family History  Problem Relation Age of Onset   Emphysema Father    Breast cancer Mother    Breast cancer Maternal Grandmother    Heart disease Sister    Cancer Sister    Diabetes Brother     Past Surgical History:  Procedure Laterality Date   CESAREAN SECTION     one previous   right arm fracture     TUBAL LIGATION      ROS: Review of Systems Negative except as stated above  PHYSICAL EXAM: BP (!) 147/81   Pulse 95   Resp 16   Ht '5\' 2"'$  (1.575 m)   Wt 180 lb 9.6 oz (81.9 kg)   SpO2 97%   BMI 33.03 kg/m   Physical Exam Repeat blood pressure 154/80 General appearance - alert, well appearing, older African-American female and in no distress Mental status - normal mood, behavior, speech, dress, motor activity, and thought processes Eyes - pupils equal and reactive, extraocular eye movements intact Neck - supple, no significant adenopathy Chest - clear to auscultation, no wheezes, rales or rhonchi, symmetric air entry Heart - normal rate, regular rhythm, normal S1, S2, no murmurs, rubs, clicks or gallops Abdomen - soft, nontender, nondistended, no masses or organomegaly Extremities - peripheral pulses normal, no pedal  edema, no clubbing or cyanosis   CMP Latest Ref Rng & Units 10/05/2020 07/04/2020 10/06/2019  Glucose 70 - 99 mg/dL 117(H) 101(H) 145(H)  BUN 8 - 23 mg/dL '10 11 14  '$ Creatinine 0.44 - 1.00 mg/dL 1.11(H) 1.06(H) 1.27(H)  Sodium 135 - 145 mmol/L 138 138 141  Potassium 3.5 - 5.1 mmol/L 4.1 4.8 4.2  Chloride 98 - 111 mmol/L 105 102 107  CO2 22 - 32 mmol/L '23 21 25  '$ Calcium 8.9 - 10.3 mg/dL 9.4 9.8 9.7  Total Protein 6.5 - 8.1 g/dL 9.4(H) - 8.5(H)  Total Bilirubin  0.3 - 1.2 mg/dL 1.3(H) - 0.7  Alkaline Phos 38 - 126 U/L 117 - 138(H)  AST 15 - 41 U/L 18 - 17  ALT 0 - 44 U/L 13 - 14   Lipid Panel     Component Value Date/Time   CHOL 235 (H) 07/04/2020 0958   TRIG 325 (H) 07/04/2020 0958   HDL 33 (L) 07/04/2020 0958   CHOLHDL 7.1 (H) 07/04/2020 0958   CHOLHDL 7.2 (H) 12/24/2015 1109   VLDL 74 (H) 12/24/2015 1109   LDLCALC 142 (H) 07/04/2020 0958    CBC    Component Value Date/Time   WBC 7.3 10/05/2020 0926   RBC 4.06 10/05/2020 0926   HGB 12.2 10/05/2020 0926   HGB 12.2 10/06/2019 0932   HGB 13.3 05/13/2017 0956   HGB 12.3 08/11/2013 1046   HCT 37.4 10/05/2020 0926   HCT 39.9 05/13/2017 0956   HCT 37.0 08/11/2013 1046   PLT 377 10/05/2020 0926   PLT 344 10/06/2019 0932   PLT 346 05/13/2017 0956   MCV 92.1 10/05/2020 0926   MCV 90 05/13/2017 0956   MCV 91.2 08/11/2013 1046   MCH 30.0 10/05/2020 0926   MCHC 32.6 10/05/2020 0926   RDW 14.1 10/05/2020 0926   RDW 13.8 05/13/2017 0956   RDW 13.6 08/11/2013 1046   LYMPHSABS 1.9 10/05/2020 0926   LYMPHSABS 2.4 05/13/2017 0956   LYMPHSABS 1.7 08/11/2013 1046   MONOABS 0.6 10/05/2020 0926   MONOABS 0.6 08/11/2013 1046   EOSABS 0.0 10/05/2020 0926   EOSABS 0.2 05/13/2017 0956   BASOSABS 0.1 10/05/2020 0926   BASOSABS 0.0 05/13/2017 0956   BASOSABS 0.1 08/11/2013 1046    ASSESSMENT AND PLAN: 1. Establishing care with new doctor, encounter for   2. Essential hypertension Not at goal.  We will restart low-dose of  amlodipine.  DASH diet discussed and encouraged.  Encouraged her to write down her blood pressure reading.  Follow-up with clinical pharmacist in 2 to 3 weeks for recheck. - CBC - Comprehensive metabolic panel - amLODipine (NORVASC) 5 MG tablet; Take 1 tablet by mouth daily.  Dispense: 30 tablet; Refill: 3  3. Hyperlipidemia, unspecified hyperlipidemia type Continue atorvastatin. - Lipid panel  4. Left upper quadrant abdominal pain Advised patient to follow-up the next time she has an acute episode of the abdominal pain.  Recommend colon cancer screening. - Ambulatory referral to Gastroenterology  5. CIDP (chronic inflammatory demyelinating polyneuropathy) (HCC) Followed by neurology and stable on every 3 weeks IVIG  6. Thrombophilia ([place]) On Xarelto Advised to report any unusual bruising or if she has any bleeding.  7. Encounter for screening mammogram for malignant neoplasm of breast - MM Digital Screening; Future  8. Screening for colon cancer - Ambulatory referral to Gastroenterology  9. COVID-19 vaccination declined   10. 23-polyvalent pneumococcal polysaccharide vaccine declined    Patient was given the opportunity to ask questions.  Patient verbalized understanding of the plan and was able to repeat key elements of the plan.   Orders Placed This Encounter  Procedures   MM Digital Screening   CBC   Comprehensive metabolic panel   Lipid panel   Ambulatory referral to Gastroenterology     Requested Prescriptions   Signed Prescriptions Disp Refills   amLODipine (NORVASC) 5 MG tablet 30 tablet 3    Sig: Take 1 tablet by mouth daily.    Return in about 4 months (around 10/20/2021) for Give appt with [facility] in 2 wks for BP check.  Tammy Plumber, MD, FACP

## 2021-06-21 ENCOUNTER — Other Ambulatory Visit (HOSPITAL_COMMUNITY): Payer: Self-pay

## 2021-06-21 LAB — COMPREHENSIVE METABOLIC PANEL
ALT: 15 IU/L (ref 0–32)
AST: 21 IU/L (ref 0–40)
Albumin/Globulin Ratio: 0.9 — ABNORMAL LOW (ref 1.2–2.2)
Albumin: 4.1 g/dL (ref 3.8–4.8)
Alkaline Phosphatase: 124 IU/L — ABNORMAL HIGH (ref 44–121)
BUN/Creatinine Ratio: 9 — ABNORMAL LOW (ref 12–28)
BUN: 10 mg/dL (ref 8–27)
Bilirubin Total: 0.6 mg/dL (ref 0.0–1.2)
CO2: 24 mmol/L (ref 20–29)
Calcium: 9.9 mg/dL (ref 8.7–10.3)
Chloride: 101 mmol/L (ref 96–106)
Creatinine, Ser: 1.13 mg/dL — ABNORMAL HIGH (ref 0.57–1.00)
Globulin, Total: 4.4 g/dL (ref 1.5–4.5)
Glucose: 86 mg/dL (ref 65–99)
Potassium: 5.1 mmol/L (ref 3.5–5.2)
Sodium: 139 mmol/L (ref 134–144)
Total Protein: 8.5 g/dL (ref 6.0–8.5)
eGFR: 55 mL/min/{1.73_m2} — ABNORMAL LOW (ref >59)

## 2021-06-21 LAB — CBC
Hematocrit: 40.1 % (ref 34.0–46.6)
Hemoglobin: 13 g/dL (ref 11.1–15.9)
MCH: 30 pg (ref 26.6–33.0)
MCHC: 32.4 g/dL (ref 31.5–35.7)
MCV: 92 fL (ref 79–97)
Platelets: 355 10*3/uL (ref 150–450)
RBC: 4.34 x10E6/uL (ref 3.77–5.28)
RDW: 12.8 % (ref 11.7–15.4)
WBC: 9.1 10*3/uL (ref 3.4–10.8)

## 2021-06-21 LAB — LIPID PANEL
Chol/HDL Ratio: 6 ratio — ABNORMAL HIGH (ref 0.0–4.4)
Cholesterol, Total: 222 mg/dL — ABNORMAL HIGH (ref 100–199)
HDL: 37 mg/dL — ABNORMAL LOW (ref >39)
LDL Chol Calc (NIH): 126 mg/dL — ABNORMAL HIGH (ref 0–99)
Triglycerides: 335 mg/dL — ABNORMAL HIGH (ref 0–149)
VLDL Cholesterol Cal: 59 mg/dL — ABNORMAL HIGH (ref 5–40)

## 2021-06-21 MED FILL — Atorvastatin Calcium Tab 40 MG (Base Equivalent): ORAL | 90 days supply | Qty: 90 | Fill #1 | Status: AC

## 2021-06-21 MED FILL — Rivaroxaban Tab 20 MG: ORAL | 30 days supply | Qty: 30 | Fill #2 | Status: AC

## 2021-06-21 NOTE — Progress Notes (Signed)
Blood cell count is normal meaning no anemia.  Kidney function is not 100% but stable compared to when it was last checked in December of last year.  Liver function tests good.  Cholesterol levels are elevated suggesting that she is not taking the atorvastatin consistently.  Please encourage her to do so.

## 2021-06-24 ENCOUNTER — Telehealth: Payer: Self-pay

## 2021-06-24 NOTE — Telephone Encounter (Signed)
Contacted pt to go over lab results pt didn't answer lvm   Sent a CRM and forward labs to NT to give pt labs when they call back   

## 2021-07-11 ENCOUNTER — Other Ambulatory Visit: Payer: Self-pay

## 2021-07-11 ENCOUNTER — Ambulatory Visit: Payer: Medicaid Other | Attending: Internal Medicine | Admitting: Pharmacist

## 2021-07-11 ENCOUNTER — Encounter: Payer: Self-pay | Admitting: Pharmacist

## 2021-07-11 VITALS — BP 121/81

## 2021-07-11 DIAGNOSIS — I10 Essential (primary) hypertension: Secondary | ICD-10-CM

## 2021-07-11 NOTE — Progress Notes (Signed)
   S:   PCP: Dr. Wynetta Emery    Patient arrives in good spirits. Presents to the clinic for hypertension evaluation, counseling, and management. Patient was referred and last seen by Primary Care Provider on 06/20/2021. At that visit, patient's blood pressure 147/81 and amlodipine 5 mg daily was restarted.   Today, patient reports that she believes her blood pressure was elevated at her PCP visit because right before her BP was taken, she read a story upset her.   Patient denies chest pain, vision changes, and headaches.   Medication adherence reported, however patient does not take amlodipine at the same time each day. She reports she has not taken amlodipine this morning, last dose was ~2000 last night.   Current BP Medications include:  amlodipine 5 mg daily   Antihypertensives tried in the past include: amlodipine 5 mg daily  Dietary habits include: No change in diet, eats a lot of processed food. Eats water Exercise habits include: walk, cut grass  O:  Vitals:   07/11/21 0857  BP: 121/81   Home BP readings 8/18-present SBP range: 117-144 (outliers: 151, 160, 163) DBP range: 71-88 (outliers: 60)  Last 3 Office BP readings: BP Readings from Last 3 Encounters:  07/11/21 121/81  06/20/21 (!) 147/81  10/05/20 (!) 149/75    BMET    Component Value Date/Time   NA 139 06/20/2021 1539   NA 141 08/11/2013 1046   K 5.1 06/20/2021 1539   K 4.3 08/11/2013 1046   CL 101 06/20/2021 1539   CO2 24 06/20/2021 1539   CO2 27 08/11/2013 1046   GLUCOSE 86 06/20/2021 1539   GLUCOSE 117 (H) 10/05/2020 0926   GLUCOSE 102 08/11/2013 1046   BUN 10 06/20/2021 1539   BUN 10.5 08/11/2013 1046   CREATININE 1.13 (H) 06/20/2021 1539   CREATININE 1.27 (H) 10/06/2019 0932   CREATININE 0.99 10/22/2015 1159   CREATININE 1.0 08/11/2013 1046   CALCIUM 9.9 06/20/2021 1539   CALCIUM 9.5 08/11/2013 1046   GFRNONAA 56 (L) 10/05/2020 0926   GFRNONAA 46 (L) 10/06/2019 0932   GFRNONAA 63 10/22/2015 1159    GFRAA 65 07/04/2020 0958   GFRAA 53 (L) 10/06/2019 0932   GFRAA 73 10/22/2015 1159    Renal function: CrCl cannot be calculated (Unknown ideal weight.).  Clinical ASCVD: No  The 10-year ASCVD risk score (Arnett DK, et al., 2019) is: 17.1%   Values used to calculate the score:     Age: 65 years     Sex: Female     Is Non-Hispanic African American: Yes     Diabetic: No     Tobacco smoker: Yes     Systolic Blood Pressure: 123XX123 mmHg     Is BP treated: Yes     HDL Cholesterol: 37 mg/dL     Total Cholesterol: 222 mg/dL   A/P: Hypertension diagnosed in 2015 currently controlled on current medications. BP Goal = < 130/80 mmHg. Medication adherence reported. Encouraged patient to take her amlodipine at the same time every day for more consistent BP lowering.  -Continued amlodipine 5 mg daily  -Counseled on lifestyle modifications for blood pressure control including reduced dietary sodium, increased exercise, adequate sleep.  Results reviewed and written information provided.   Total time in face-to-face counseling 25 minutes.   F/U pharmacist Clinic Visit in 1 month.  Benard Halsted, PharmD, [place], [place] [phone]

## 2021-08-26 ENCOUNTER — Ambulatory Visit: Payer: Medicaid Other | Admitting: Pharmacist

## 2021-09-02 ENCOUNTER — Encounter: Payer: Self-pay | Admitting: Pharmacist

## 2021-09-02 ENCOUNTER — Ambulatory Visit: Payer: Medicaid Other | Attending: Family Medicine | Admitting: Pharmacist

## 2021-09-02 ENCOUNTER — Other Ambulatory Visit: Payer: Self-pay

## 2021-09-02 VITALS — BP 121/73

## 2021-09-02 DIAGNOSIS — I10 Essential (primary) hypertension: Secondary | ICD-10-CM | POA: Diagnosis not present

## 2021-09-02 NOTE — Progress Notes (Signed)
   S:   PCP: Dr. Wynetta Emery    Patient arrives in good spirits. Presents to the clinic for hypertension evaluation, counseling, and management. Patient was referred and last seen by Primary Care Provider on 06/20/2021. We saw her on 07/11/2021 and BP was good.   Patient denies chest pain, vision changes, and headaches.   Medication adherence reported.  Current BP Medications include:  amlodipine 5 mg daily   Antihypertensives tried in the past include: amlodipine 5 mg daily  Dietary habits include: No change in diet, eats a lot of processed food. Eats water Exercise habits include: walk, cut grass  O:  Vitals:   09/02/21 1433  BP: 121/73    Last 3 Office BP readings: BP Readings from Last 3 Encounters:  09/02/21 121/73  07/11/21 121/81  06/20/21 (!) 147/81   BMET    Component Value Date/Time   NA 139 06/20/2021 1539   NA 141 08/11/2013 1046   K 5.1 06/20/2021 1539   K 4.3 08/11/2013 1046   CL 101 06/20/2021 1539   CO2 24 06/20/2021 1539   CO2 27 08/11/2013 1046   GLUCOSE 86 06/20/2021 1539   GLUCOSE 117 (H) 10/05/2020 0926   GLUCOSE 102 08/11/2013 1046   BUN 10 06/20/2021 1539   BUN 10.5 08/11/2013 1046   CREATININE 1.13 (H) 06/20/2021 1539   CREATININE 1.27 (H) 10/06/2019 0932   CREATININE 0.99 10/22/2015 1159   CREATININE 1.0 08/11/2013 1046   CALCIUM 9.9 06/20/2021 1539   CALCIUM 9.5 08/11/2013 1046   GFRNONAA 56 (L) 10/05/2020 0926   GFRNONAA 46 (L) 10/06/2019 0932   GFRNONAA 63 10/22/2015 1159   GFRAA 65 07/04/2020 0958   GFRAA 53 (L) 10/06/2019 0932   GFRAA 73 10/22/2015 1159    Renal function: CrCl cannot be calculated (Patient's most recent lab result is older than the maximum 21 days allowed.).  Clinical ASCVD: No  The 10-year ASCVD risk score (Arnett DK, et al., 2019) is: 17.1%   Values used to calculate the score:     Age: 65 years     Sex: Female     Is Non-Hispanic African American: Yes     Diabetic: No     Tobacco smoker: Yes     Systolic  Blood Pressure: 121 mmHg     Is BP treated: Yes     HDL Cholesterol: 37 mg/dL     Total Cholesterol: 222 mg/dL   A/P: Hypertension diagnosed in 2015 currently controlled on amlodipine. BP Goal = < 130/80 mmHg. Medication adherence reported.  -Continued amlodipine 5 mg daily  -Counseled on lifestyle modifications for blood pressure control including reduced dietary sodium, increased exercise, adequate sleep.  Results reviewed and written information provided.   Total time in face-to-face counseling 25 minutes.   F/U with PCP.   Benard Halsted, PharmD, [place], [place] [phone]

## 2021-09-05 ENCOUNTER — Other Ambulatory Visit: Payer: Self-pay | Admitting: Cardiology

## 2021-09-05 ENCOUNTER — Other Ambulatory Visit (HOSPITAL_COMMUNITY): Payer: Self-pay

## 2021-09-05 DIAGNOSIS — E785 Hyperlipidemia, unspecified: Secondary | ICD-10-CM

## 2021-09-05 MED ORDER — ATORVASTATIN CALCIUM 40 MG PO TABS
40.0000 mg | ORAL_TABLET | Freq: Every day | ORAL | 0 refills | Status: DC
  Filled 2021-09-05: qty 90, 90d supply, fill #0

## 2021-09-05 MED FILL — Rivaroxaban Tab 20 MG: ORAL | 30 days supply | Qty: 30 | Fill #3 | Status: AC

## 2021-09-06 ENCOUNTER — Ambulatory Visit
Admission: RE | Admit: 2021-09-06 | Discharge: 2021-09-06 | Disposition: A | Discharge status: Home / Self Care | Payer: Medicaid Other | Source: Ambulatory Visit | Attending: Internal Medicine | Admitting: Internal Medicine

## 2021-09-06 ENCOUNTER — Other Ambulatory Visit: Payer: Self-pay

## 2021-09-06 DIAGNOSIS — Z1231 Encounter for screening mammogram for malignant neoplasm of breast: Secondary | ICD-10-CM

## 2021-10-07 ENCOUNTER — Encounter: Payer: Self-pay | Admitting: Hematology and Oncology

## 2021-10-07 ENCOUNTER — Other Ambulatory Visit (HOSPITAL_COMMUNITY): Payer: Self-pay

## 2021-10-07 ENCOUNTER — Inpatient Hospital Stay: Payer: Medicaid Other | Attending: Hematology and Oncology | Admitting: Hematology and Oncology

## 2021-10-07 ENCOUNTER — Other Ambulatory Visit: Payer: Self-pay

## 2021-10-07 ENCOUNTER — Inpatient Hospital Stay: Payer: Medicaid Other

## 2021-10-07 DIAGNOSIS — N183 Chronic kidney disease, stage 3 unspecified: Secondary | ICD-10-CM | POA: Insufficient documentation

## 2021-10-07 DIAGNOSIS — G6181 Chronic inflammatory demyelinating polyneuritis: Secondary | ICD-10-CM

## 2021-10-07 DIAGNOSIS — Z86718 Personal history of other venous thrombosis and embolism: Secondary | ICD-10-CM | POA: Diagnosis not present

## 2021-10-07 DIAGNOSIS — E785 Hyperlipidemia, unspecified: Secondary | ICD-10-CM | POA: Diagnosis not present

## 2021-10-07 DIAGNOSIS — Z7901 Long term (current) use of anticoagulants: Secondary | ICD-10-CM | POA: Diagnosis not present

## 2021-10-07 DIAGNOSIS — I825Y9 Chronic embolism and thrombosis of unspecified deep veins of unspecified proximal lower extremity: Secondary | ICD-10-CM

## 2021-10-07 LAB — CBC WITH DIFFERENTIAL/PLATELET
Abs Immature Granulocytes: 0.04 10*3/uL (ref 0.00–0.07)
Basophils Absolute: 0.1 10*3/uL (ref 0.0–0.1)
Basophils Relative: 1 %
Eosinophils Absolute: 0.3 10*3/uL (ref 0.0–0.5)
Eosinophils Relative: 3 %
HCT: 37.2 % (ref 36.0–46.0)
Hemoglobin: 12 g/dL (ref 12.0–15.0)
Immature Granulocytes: 0 %
Lymphocytes Relative: 30 %
Lymphs Abs: 2.8 10*3/uL (ref 0.7–4.0)
MCH: 29.6 pg (ref 26.0–34.0)
MCHC: 32.3 g/dL (ref 30.0–36.0)
MCV: 91.6 fL (ref 80.0–100.0)
Monocytes Absolute: 0.6 10*3/uL (ref 0.1–1.0)
Monocytes Relative: 7 %
Neutro Abs: 5.5 10*3/uL (ref 1.7–7.7)
Neutrophils Relative %: 59 %
Platelets: 360 10*3/uL (ref 150–400)
RBC: 4.06 MIL/uL (ref 3.87–5.11)
RDW: 14.4 % (ref 11.5–15.5)
WBC: 9.2 10*3/uL (ref 4.0–10.5)
nRBC: 0 % (ref 0.0–0.2)

## 2021-10-07 LAB — BASIC METABOLIC PANEL - CANCER CENTER ONLY
Anion gap: 9 (ref 5–15)
BUN: 9 mg/dL (ref 8–23)
CO2: 24 mmol/L (ref 22–32)
Calcium: 9.2 mg/dL (ref 8.9–10.3)
Chloride: 105 mmol/L (ref 98–111)
Creatinine: 1.11 mg/dL — ABNORMAL HIGH (ref 0.44–1.00)
GFR, Estimated: 56 mL/min — ABNORMAL LOW (ref >60)
Glucose, Bld: 125 mg/dL — ABNORMAL HIGH (ref 70–99)
Potassium: 3.9 mmol/L (ref 3.5–5.1)
Sodium: 138 mmol/L (ref 135–145)

## 2021-10-07 MED ORDER — RIVAROXABAN 20 MG PO TABS
ORAL_TABLET | Freq: Every day | ORAL | 11 refills | Status: DC
  Filled 2021-10-07: qty 30, 30d supply, fill #0
  Filled 2021-12-06: qty 30, 30d supply, fill #1
  Filled 2022-02-06: qty 30, 30d supply, fill #2
  Filled 2022-04-15: qty 30, 30d supply, fill #3
  Filled 2022-06-10: qty 30, 30d supply, fill #4
  Filled 2022-07-17: qty 30, 30d supply, fill #5
  Filled 2022-08-26: qty 30, 30d supply, fill #6
  Filled 2022-09-29: qty 30, 30d supply, fill #7

## 2021-10-07 NOTE — Progress Notes (Signed)
[place] OFFICE PROGRESS NOTE  Tammy Pier, MD  ASSESSMENT & PLAN:  DVT (deep venous thrombosis) She is doing with very well on long termXarelto. She has no complications from treatment. Goal of treatment is lifelong. I will see her once a year She is educated to watch for signs and symptoms of bleeding  CKD (chronic kidney disease) stage 3, GFR 30-59 ml/min She has intermittent elevated serum creatinine Her creatinine clearance is adequate for her to remain on Xarelto I will continue to follow her once a year  Dyslipidemia She has significant abnormal lipid profile recently We discussed importance of aggressive lifestyle modification She appears motivated to lose weight in the future before I see her back  No orders of the defined types were placed in this encounter.   The total time spent in the appointment was 20 minutes encounter with patients including review of chart and various tests results, discussions about plan of care and coordination of care plan   All questions were answered. The patient knows to call the clinic with any problems, questions or concerns. No barriers to learning was detected.    [name], MD 12/5/202210:28 AM  INTERVAL HISTORY: Tammy [name] 65 y.o. female returns for further follow-up on chronic anticoagulation therapy for recurrent DVT She is doing well No recent bleeding complications No recent leg swelling, chest pain or shortness of breath  SUMMARY OF HEMATOLOGIC HISTORY:  She is being referred because of recurrent DVT. According to the patient, she was first diagnosed with blood clot in 2008. According to the patient it was unprovoked. The patient complained of shortness of breath and cough and chest discomfort. She was smoking at that time. She had imaging study of the lung done which show evidence of PE. According to the patient she was on a blood thinner for 2 years. The date of the CT scan was  06/24/2007. In 2014, she started to complain of intermittent sharp discomfort on the right leg associated with some mild swelling. She states that she had pain on the left leg as well but is not as severe compared to the right leg. She went to the emergency department. Results of the ultrasound venous Doppler came back consistent with acute deep vein thrombosis involving the posterior tibial and perioneal vein of the right lower extremity. D-dimer was elevated. She was placed on Lovenox and transition to warfarin 5 mg daily. Her INR has been supratherapeutic requiring vitamin K and multiple dose interruption to keep her INR at the desire range at 2-3. She denies any bleeding complications such as spontaneous epistaxis, hematuria, hematochezia, or the need for blood transfusions. She had mild intermittent chest discomfort throughout this and had imaging study done which show possible new clot in the left lung on the CT scan from September 2014. She has been pregnant 4 times including C-section for her pregnancy and never developed any peripartum blood clots. There were no family history of blood clots. In October 2014, I made a decision to change her to Xarelto.  Duration of treatment is lifelong  I have reviewed the past medical history, past surgical history, social history and family history with the patient and they are unchanged from previous note.  ALLERGIES:  has No Known Allergies.  MEDICATIONS:  Current Outpatient Medications  Medication Sig Dispense Refill   amLODipine (NORVASC) 5 MG tablet Take 1 tablet by mouth daily. 30 tablet 3   atorvastatin (LIPITOR) 40 MG tablet Take 1 tablet (40  mg total) by mouth daily. Patient need a appointment for future refills. 90 tablet 0   clobetasol cream (TEMOVATE) 6.19 % Apply 1 application topically 2 (two) times daily. (Patient not taking: No sig reported) 60 g 3   DULoxetine (CYMBALTA) 30 MG capsule Take 1 capsule (30 mg total) by mouth daily. 30 capsule  3   gabapentin (NEURONTIN) 300 MG capsule Take 1 capsule by mouth in the morning and take 2 capsules at bedtime. 270 capsule 3   Pediatric Multivit-Minerals-C ([facility] GUMMY MULTIVITAMIN KIDS) CHEW Chew by mouth. (Patient not taking: Reported on 04/25/2021)     rivaroxaban (XARELTO) 20 MG TABS tablet TAKE 1 TABLET BY MOUTH ONCE A DAY 30 tablet 11   Current Facility-Administered Medications  Medication Dose Route Frequency Provider Last Rate Last Admin   methylPREDNISolone sodium succinate (SOLU-MEDROL) 1,000 mg in sodium chloride 0.9 % 50 mL IVPB  1,000 mg Intravenous Q28 days Alda Berthold, DO 58 mL/hr at 01/27/18 0928 1,000 mg at 01/27/18 5093     REVIEW OF SYSTEMS:   Constitutional: Denies fevers, chills or night sweats Eyes: Denies blurriness of vision Ears, nose, mouth, throat, and face: Denies mucositis or sore throat Respiratory: Denies cough, dyspnea or wheezes Cardiovascular: Denies palpitation, chest discomfort or lower extremity swelling Gastrointestinal:  Denies nausea, heartburn or change in bowel habits Skin: Denies abnormal skin rashes Lymphatics: Denies new lymphadenopathy or easy bruising Neurological:Denies numbness, tingling or new weaknesses Behavioral/Psych: Mood is stable, no new changes  All other systems were reviewed with the patient and are negative.  PHYSICAL EXAMINATION: ECOG PERFORMANCE STATUS: 0 - Asymptomatic  Vitals:   10/07/21 0935  BP: (!) 151/81  Pulse: (!) 114  Resp: 18  Temp: 97.6 F (36.4 C)  SpO2: 99%   Filed Weights   10/07/21 0935  Weight: 181 lb 3.2 oz (82.2 kg)    GENERAL:alert, no distress and comfortable SKIN: skin color, texture, turgor are normal, no rashes or significant lesions EYES: normal, Conjunctiva are pink and non-injected, sclera clear OROPHARYNX:no exudate, no erythema and lips, buccal mucosa, and tongue normal  NECK: supple, thyroid normal size, non-tender, without nodularity LYMPH:  no palpable lymphadenopathy in the  cervical, axillary or inguinal LUNGS: clear to auscultation and percussion with normal breathing effort HEART: regular rate & rhythm and no murmurs and no lower extremity edema ABDOMEN:abdomen soft, non-tender and normal bowel sounds Musculoskeletal:no cyanosis of digits and no clubbing  NEURO: alert & oriented x 3 with fluent speech, no focal motor/sensory deficits  LABORATORY DATA:  I have reviewed the data as listed     Component Value Date/Time   NA 138 10/07/2021 0920   NA 139 06/20/2021 1539   NA 141 08/11/2013 1046   K 3.9 10/07/2021 0920   K 4.3 08/11/2013 1046   CL 105 10/07/2021 0920   CO2 24 10/07/2021 0920   CO2 27 08/11/2013 1046   GLUCOSE 125 (H) 10/07/2021 0920   GLUCOSE 102 08/11/2013 1046   BUN 9 10/07/2021 0920   BUN 10 06/20/2021 1539   BUN 10.5 08/11/2013 1046   CREATININE 1.11 (H) 10/07/2021 0920   CREATININE 0.99 10/22/2015 1159   CREATININE 1.0 08/11/2013 1046   CALCIUM 9.2 10/07/2021 0920   CALCIUM 9.5 08/11/2013 1046   PROT 8.5 06/20/2021 1539   PROT 7.3 08/11/2013 1046   ALBUMIN 4.1 06/20/2021 1539   ALBUMIN 4.1 11/10/2017 1444   ALBUMIN 3.4 (L) 08/11/2013 1046   AST 21 06/20/2021 1539   AST 17 10/06/2019  0932   AST 15 08/11/2013 1046   ALT 15 06/20/2021 1539   ALT 14 10/06/2019 0932   ALT 13 08/11/2013 1046   ALKPHOS 124 (H) 06/20/2021 1539   ALKPHOS 112 08/11/2013 1046   BILITOT 0.6 06/20/2021 1539   BILITOT 0.7 10/06/2019 0932   BILITOT 0.73 08/11/2013 1046   GFRNONAA 56 (L) 10/07/2021 0920   GFRNONAA 63 10/22/2015 1159   GFRAA 65 07/04/2020 0958   GFRAA 53 (L) 10/06/2019 0932   GFRAA 73 10/22/2015 1159    No results found for: SPEP, UPEP  Lab Results  Component Value Date   WBC 9.2 10/07/2021   NEUTROABS 5.5 10/07/2021   HGB 12.0 10/07/2021   HCT 37.2 10/07/2021   MCV 91.6 10/07/2021   PLT 360 10/07/2021      Chemistry      Component Value Date/Time   NA 138 10/07/2021 0920   NA 139 06/20/2021 1539   NA 141 08/11/2013  1046   K 3.9 10/07/2021 0920   K 4.3 08/11/2013 1046   CL 105 10/07/2021 0920   CO2 24 10/07/2021 0920   CO2 27 08/11/2013 1046   BUN 9 10/07/2021 0920   BUN 10 06/20/2021 1539   BUN 10.5 08/11/2013 1046   CREATININE 1.11 (H) 10/07/2021 0920   CREATININE 0.99 10/22/2015 1159   CREATININE 1.0 08/11/2013 1046      Component Value Date/Time   CALCIUM 9.2 10/07/2021 0920   CALCIUM 9.5 08/11/2013 1046   ALKPHOS 124 (H) 06/20/2021 1539   ALKPHOS 112 08/11/2013 1046   AST 21 06/20/2021 1539   AST 17 10/06/2019 0932   AST 15 08/11/2013 1046   ALT 15 06/20/2021 1539   ALT 14 10/06/2019 0932   ALT 13 08/11/2013 1046   BILITOT 0.6 06/20/2021 1539   BILITOT 0.7 10/06/2019 0932   BILITOT 0.73 08/11/2013 1046

## 2021-10-07 NOTE — Assessment & Plan Note (Signed)
She has intermittent elevated serum creatinine Her creatinine clearance is adequate for her to remain on Xarelto I will continue to follow her once a year

## 2021-10-07 NOTE — Assessment & Plan Note (Signed)
She has significant abnormal lipid profile recently We discussed importance of aggressive lifestyle modification She appears motivated to lose weight in the future before I see her back

## 2021-10-07 NOTE — Assessment & Plan Note (Signed)
She is doing with very well on long termXarelto. She has no complications from treatment. Goal of treatment is lifelong. I will see her once a year She is educated to watch for signs and symptoms of bleeding 

## 2021-10-21 ENCOUNTER — Ambulatory Visit: Payer: Medicaid Other | Admitting: Internal Medicine

## 2021-10-25 ENCOUNTER — Other Ambulatory Visit: Payer: Self-pay

## 2021-10-25 ENCOUNTER — Telehealth (INDEPENDENT_AMBULATORY_CARE_PROVIDER_SITE_OTHER): Payer: Medicaid Other | Admitting: Neurology

## 2021-10-25 DIAGNOSIS — G6181 Chronic inflammatory demyelinating polyneuritis: Secondary | ICD-10-CM

## 2021-10-25 NOTE — Progress Notes (Signed)
° °  Virtual Visit via Video Note The purpose of this virtual visit is to provide medical care while limiting exposure to the novel coronavirus.    Consent was obtained for video visit:  Yes.   Answered questions that patient had about telehealth interaction:  Yes.   I discussed the limitations, risks, security and privacy concerns of performing an evaluation and management service by telemedicine. I also discussed with the patient that there may be a patient responsible charge related to this service. The patient expressed understanding and agreed to proceed.  Pt location: Home Physician Location: office Name of referring provider:  [name], MD I connected with Balinda Quails at patients initiation/request on 10/25/2021 at  8:30 AM EST by video enabled telemedicine application and verified that I am speaking with the correct person using two identifiers. Pt MRN:  [id number] Pt DOB:  [birth year] Video Participants:  Balinda Quails   History of Present Illness: This is a 65 y.o. female returning for follow-up of CIDP.  She remains on IVIG every 21 days and feels that this has stabilized her neuropathy. She continues to have paresthesias of the hands, but nothing which is worse.  She takes gabapentin 300mg  in the morning and 600mg  at bedtime which helps.  No new weakness, falls, or imbalance.  Observations/Objective:   There were no vitals filed for this visit.  Patient is awake, alert, and appears comfortable.  Oriented x 4.   Extraocular muscles are intact. No ptosis.  Face is symmetric.  Speech is not dysarthric.  Antigravity in all extremities.  No pronator drift. Gait appears normal, unassisted.  Finger tapping intact biilterally.   DATA: NCS/EMG of the arms 10/18/2019: The electrophysiologic findings are consistent with a severe active and chronic polyradiculoneuropathy affecting the upper extremities, which has worsened from study on 10/15/2017.  Assessment and Plan:   Chronic inflammatory demyelinating polyradiculoneuropathy (diagnosed 2018) manifesting with paresthesia and weakness in the upper extremities, supported by EMG and CSF.  IVIG started in 07/2018 every 21 days, which has stabilized symptoms.   Repeat EMG from 2020 reviewed and shows progressive neuropathy.  - Reduce IVIG to every 28 days to see if symptoms can be controlled on a lower dose.  If symptoms get worse, IVIG can be increased to every 21 days  - Will need to consider adding imuran/cellcept going forward to taper IVIG  - Continue gabapentin 300mg  in the morning and 600mg  at bedtime.   Follow Up Instructions:   I discussed the assessment and treatment plan with the patient. The patient was provided an opportunity to ask questions and all were answered. The patient agreed with the plan and demonstrated an understanding of the instructions.   The patient was advised to call back or seek an in-person evaluation if the symptoms worsen or if the condition fails to improve as anticipated.  Follow-up in 1 year  Alda Berthold, DO

## 2021-12-02 ENCOUNTER — Telehealth: Payer: Self-pay | Admitting: Neurology

## 2021-12-02 ENCOUNTER — Other Ambulatory Visit (HOSPITAL_COMMUNITY): Payer: Self-pay

## 2021-12-02 ENCOUNTER — Other Ambulatory Visit: Payer: Self-pay | Admitting: Cardiology

## 2021-12-02 ENCOUNTER — Ambulatory Visit: Payer: Self-pay | Admitting: *Deleted

## 2021-12-02 DIAGNOSIS — Z1211 Encounter for screening for malignant neoplasm of colon: Secondary | ICD-10-CM

## 2021-12-02 DIAGNOSIS — R1012 Left upper quadrant pain: Secondary | ICD-10-CM

## 2021-12-02 DIAGNOSIS — E785 Hyperlipidemia, unspecified: Secondary | ICD-10-CM

## 2021-12-02 MED ORDER — ATORVASTATIN CALCIUM 40 MG PO TABS
40.0000 mg | ORAL_TABLET | Freq: Every day | ORAL | 0 refills | Status: DC
  Filled 2021-12-02: qty 30, 30d supply, fill #0

## 2021-12-02 NOTE — Telephone Encounter (Signed)
°  Chief Complaint: pain in left side.  Saw Dr. Wynetta Emery about this and was referred to a specialist.   "They hung up on me" Symptoms: intermittent pain in left side Frequency: Started again yesterday. Pertinent Negatives: Patient denies vomiting or diarrhea Disposition: [] ED /[] Urgent Care (no appt availability in office) / [x] Appointment(In office/virtual)/ []  [place] [facility]/ [] Home Care/ [] Refused Recommended Disposition /[] [place] [facility]/ []  Follow-up with PCP Additional Notes: Pt was agreeable to go to the ED if it got worse before her upcoming appt with Dr. Wynetta Emery

## 2021-12-02 NOTE — Telephone Encounter (Signed)
Patient called stating she needs a refill for her gabapentin.  She is almost out.

## 2021-12-02 NOTE — Telephone Encounter (Signed)
Called patient and informed her that she should still have another refill on file. Informed patient that she was provided 90 days with 3 refills. Patient stated she will call pharmacy to check and let me know if she has any issues.

## 2021-12-02 NOTE — Telephone Encounter (Signed)
Pt calling in.   Dr. Wynetta Emery made me a referral appt to see me about my side.   That Dr. called and they asked if I was in pain and when I said no they hung up on me.   Now I'm having the pain again in my side.  I'm having pain on my left side down in my stomach area.   Dr. Wynetta Emery wanted me to see a specialist but they hung up on me.     Reason for Disposition  Abdominal pain is a chronic symptom (recurrent or ongoing AND present > 4 weeks)  Answer Assessment - Initial Assessment Questions 1. LOCATION: "Where does it hurt?"      It's in the left side of my stomach.   It started yesterday evening.   2. RADIATION: "Does the pain shoot anywhere else?" (e.g., chest, back)     No 3. ONSET: "When did the pain begin?" (e.g., minutes, hours or days ago)      Yesterday evening.   It hurts until it stops.    4. SUDDEN: "Gradual or sudden onset?"     *No Answer* 5. PATTERN "Does the pain come and go, or is it constant?"    - If constant: "Is it getting better, staying the same, or worsening?"      (Note: Constant means the pain never goes away completely; most serious pain is constant and it progresses)     - If intermittent: "How long does it last?" "Do you have pain now?"     (Note: Intermittent means the pain goes away completely between bouts)     intermittent 6. SEVERITY: "How bad is the pain?"  (e.g., Scale 1-10; mild, moderate, or severe)   - MILD (1-3): doesn't interfere with normal activities, abdomen soft and not tender to touch    - MODERATE (4-7): interferes with normal activities or awakens from sleep, abdomen tender to touch    - SEVERE (8-10): excruciating pain, doubled over, unable to do any normal activities      *No Answer* 7. RECURRENT SYMPTOM: "Have you ever had this type of stomach pain before?" If Yes, ask: "When was the last time?" and "What happened that time?"      *No Answer* 8. CAUSE: "What do you think is causing the stomach pain?"     *No Answer* 9.  RELIEVING/AGGRAVATING FACTORS: "What makes it better or worse?" (e.g., movement, antacids, bowel movement)     *No Answer* 10. OTHER SYMPTOMS: "Do you have any other symptoms?" (e.g., back pain, diarrhea, fever, urination pain, vomiting)       *No Answer* 11. PREGNANCY: "Is there any chance you are pregnant?" "When was your last menstrual period?"       *No Answer*  Protocols used: Abdominal Pain - [facility]

## 2021-12-03 ENCOUNTER — Other Ambulatory Visit: Payer: Self-pay

## 2021-12-03 ENCOUNTER — Encounter (HOSPITAL_COMMUNITY): Payer: Self-pay | Admitting: Emergency Medicine

## 2021-12-03 ENCOUNTER — Ambulatory Visit (HOSPITAL_COMMUNITY)
Admission: EM | Admit: 2021-12-03 | Discharge: 2021-12-03 | Disposition: A | Discharge status: Home / Self Care | Payer: Medicaid Other | Attending: Internal Medicine | Admitting: Internal Medicine

## 2021-12-03 DIAGNOSIS — R1012 Left upper quadrant pain: Secondary | ICD-10-CM | POA: Diagnosis not present

## 2021-12-03 NOTE — ED Provider Notes (Signed)
[place]    CSN: [id number] Arrival date & time: 12/03/21  1656      History   Chief Complaint Chief Complaint  Patient presents with   Abdominal Pain    HPI Tammy [name] is a 66 y.o. female comes to the urgent care with intermittent left upper quadrant pain.  Pain is sharp, localized to the left upper quadrant region and of moderate severity.  Pain is aggravated by palpation and resolved spontaneously.  No trauma to the abdomen.  No bruising noted.  No fever or chills.  No nausea or vomiting.  No constipation.  No abdominal distention.  This is a recurrent phenomenon.  Patient has not taken any medication at this time.  No shortness of breath or wheezing.  No long distance travel.  No calf pain.  Patient is anticoagulated. HPI  Past Medical History:  Diagnosis Date   Chronic headaches    DVT (deep venous thrombosis) (HCC)    Leukemia ([place])    Told she had it at age 78, given some shots   Pulmonary embolism ([place])    Seasonal allergies     Patient Active Problem List   Diagnosis Date Noted   Preventive measure 10/05/2020   CKD (chronic kidney disease) stage 3, GFR 30-59 ml/min ([place]) 10/06/2019   Essential hypertension 03/14/2019   Hyperlipidemia 03/14/2019   Precordial chest pain 03/14/2019   Former tobacco use 02/08/2019   CIDP (chronic inflammatory demyelinating polyneuropathy) ([place]) 01/12/2018   Positive ANA (antinuclear antibody) 12/24/2015   Generalized anxiety disorder 12/24/2015   Dyslipidemia 10/09/2014   Cervical spondylolysis 10/09/2014   DVT (deep venous thrombosis) ([place]) 08/22/2013   LGSIL (low grade squamous intraepithelial lesion) on Pap smear 08/04/2013   Sleep apnea 05/21/2011    Past Surgical History:  Procedure Laterality Date   CESAREAN SECTION     one previous   right arm fracture     TUBAL LIGATION      OB History     Gravida  7   Para  4   Term  4   Preterm  0   AB  3   Living  4      SAB  2   IAB  0    Ectopic  1   Multiple  0   Live Births               Home Medications    Prior to Admission medications   Medication Sig Start Date End Date Taking? Authorizing Provider  amLODipine (NORVASC) 5 MG tablet Take 1 tablet by mouth daily. 06/20/21   [name], MD  atorvastatin (LIPITOR) 40 MG tablet Take 1 tablet (40 mg total) by mouth daily. Patient need a appointment for future refills. 12/02/21 12/02/22  Buford Dresser, MD  gabapentin (NEURONTIN) 300 MG capsule Take 1 capsule by mouth in the morning and take 2 capsules at bedtime. 04/25/21   Narda Amber K, DO  rivaroxaban (XARELTO) 20 MG TABS tablet TAKE 1 TABLET BY MOUTH ONCE A DAY 10/07/21 10/07/22  [name], MD    Family History Family History  Problem Relation Age of Onset   Emphysema Father    Breast cancer Mother    Breast cancer Maternal Grandmother    Heart disease Sister    Cancer Sister    Diabetes Brother     Social History Social History   Tobacco Use   Smoking status: Light Smoker    Packs/day: 0.00  Years: 40.00    Pack years: 0.00    Types: Cigarettes   Smokeless tobacco: Never   Tobacco comments:    Patient reports smoking 2-3 cigarettes a day   Vaping Use   Vaping Use: Never used  Substance Use Topics   Alcohol use: No   Drug use: No     Allergies   Patient has no known allergies.   Review of Systems Review of Systems As per HPI  Physical Exam Triage Vital Signs ED Triage Vitals  Enc Vitals Group     BP 12/03/21 1844 (!) 138/91     Pulse Rate 12/03/21 1844 96     Resp 12/03/21 1844 18     Temp 12/03/21 1844 98.3 F (36.8 C)     Temp Source 12/03/21 1844 Oral     SpO2 12/03/21 1844 93 %     Weight --      Height --      Head Circumference --      Peak Flow --      Pain Score 12/03/21 1841 10     Pain Loc --      Pain Edu? --      Excl. in [place]? --    No data found.  Updated Vital Signs BP (!) 138/91 (BP Location: Left Arm)    Pulse 96    Temp 98.3 F  (36.8 C) (Oral)    Resp 18    SpO2 93%   Visual Acuity Right Eye Distance:   Left Eye Distance:   Bilateral Distance:    Right Eye Near:   Left Eye Near:    Bilateral Near:     Physical Exam Vitals and nursing note reviewed.  Constitutional:      General: She is not in acute distress.    Appearance: She is well-developed. She is not ill-appearing.  Abdominal:     General: Abdomen is flat. Bowel sounds are normal.     Palpations: Abdomen is soft. There is no hepatomegaly or splenomegaly.     Tenderness: There is abdominal tenderness in the left upper quadrant.     Comments: Point tenderness of the anterior abdominal wall in the left upper quadrant region.  Neurological:     Mental Status: She is alert.     UC Treatments / Results  Labs (all labs ordered are listed, but only abnormal results are displayed) Labs Reviewed - No data to display  EKG   Radiology No results found.  Procedures Procedures (including critical care time)  Medications Ordered in UC Medications - No data to display  Initial Impression / Assessment and Plan / UC Course  I have reviewed the triage vital signs and the nursing notes.  Pertinent labs & imaging results that were available during my care of the patient were reviewed by me and considered in my medical decision making (see chart for details).     1.  Abdominal wall pain in the left upper quadrant region: This is likely musculoskeletal Heating pad use recommended Pain is currently 5 out of 10 Tylenol as needed for pain Follow-up with primary care Final Clinical Impressions(s) / UC Diagnoses   Final diagnoses:  Abdominal wall pain in left upper quadrant     Discharge Instructions      Heating pad use only 20 minutes on-20 minutes off cycle If pain worsens she could take Tylenol as needed for pain Return to urgent care if symptoms worsen   ED Prescriptions   None  PDMP not reviewed this encounter.   Chase Picket, MD 12/03/21 2018

## 2021-12-03 NOTE — ED Triage Notes (Signed)
Having intermittent left abd pains since Saturday. Denies n/v, urinary or bowel problems.

## 2021-12-03 NOTE — Discharge Instructions (Addendum)
Heating pad use only 20 minutes on-20 minutes off cycle If pain worsens she could take Tylenol as needed for pain Return to urgent care if symptoms worsen

## 2021-12-04 NOTE — Telephone Encounter (Signed)
Contacted pt to go over provider message pt states she would like referral placed again

## 2021-12-04 NOTE — Addendum Note (Signed)
Addended by: Karle Plumber B on: 12/04/2021 05:23 PM   Modules accepted: Orders

## 2021-12-06 ENCOUNTER — Other Ambulatory Visit (HOSPITAL_COMMUNITY): Payer: Self-pay

## 2021-12-09 ENCOUNTER — Other Ambulatory Visit (HOSPITAL_COMMUNITY): Payer: Self-pay

## 2022-01-27 ENCOUNTER — Other Ambulatory Visit: Payer: Self-pay

## 2022-01-27 ENCOUNTER — Ambulatory Visit: Payer: Medicaid Other | Attending: Internal Medicine | Admitting: Internal Medicine

## 2022-01-27 ENCOUNTER — Encounter: Payer: Self-pay | Admitting: Internal Medicine

## 2022-01-27 VITALS — BP 130/79 | HR 94 | Resp 16 | Ht 61.0 in | Wt 185.6 lb

## 2022-01-27 DIAGNOSIS — D6859 Other primary thrombophilia: Secondary | ICD-10-CM | POA: Diagnosis not present

## 2022-01-27 DIAGNOSIS — Z2821 Immunization not carried out because of patient refusal: Secondary | ICD-10-CM | POA: Diagnosis not present

## 2022-01-27 DIAGNOSIS — N1831 Chronic kidney disease, stage 3a: Secondary | ICD-10-CM | POA: Diagnosis not present

## 2022-01-27 DIAGNOSIS — E66812 Obesity, class 2: Secondary | ICD-10-CM

## 2022-01-27 DIAGNOSIS — I10 Essential (primary) hypertension: Secondary | ICD-10-CM | POA: Diagnosis not present

## 2022-01-27 DIAGNOSIS — Z6835 Body mass index (BMI) 35.0-35.9, adult: Secondary | ICD-10-CM | POA: Diagnosis not present

## 2022-01-27 DIAGNOSIS — G6181 Chronic inflammatory demyelinating polyneuritis: Secondary | ICD-10-CM | POA: Diagnosis not present

## 2022-01-27 NOTE — Progress Notes (Signed)
? ? ?Patient ID: Tammy [name], female    DOB: [birth year]  MRN: [id number] ? ?CC: Hypertension ? ? ?Subjective: ?Tammy [name] is a 66 y.o. 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 CKD 3 ? ?HTN:  taking Norvasc 5 mg daily. Limits salt in foods ?Checks BP occasionally.  Readings good. ?No CP/SOB/LE edema ? ?Hx DVT/PE:  saw Dr. Alvy Bimler end of last yr. Plan to continue lifelong DOAG. ?No bruising or bleeding on Xarelto ? ?CIPD: still on IVIG now Q 4 wks. ?Saw Dr. Posey Pronto 10/2022 ?Feels she is stable ? ?CKD 3a: GFR has remain stable in the 50s.  Not on NSAIDs ? ?Obese: does not eat that much.  Thinks she carries most wgh in breasts.  Wears 44 3D bra size. Use to be 18 D as younger woman. ?Drinks a 16 oz bottle of regular [employer] daily.  ?Snacks on chocolate, rasins and ice cream ?Not getting in much exercise.  Waiting for weather to warm up ? ?HM:  declines flu shot and shingles vaccines ?Patient Active Problem List  ? Diagnosis Date Noted  ? Preventive measure 10/05/2020  ? CKD (chronic kidney disease) stage 3, GFR 30-59 ml/min (HCC) 10/06/2019  ? Essential hypertension 03/14/2019  ? Hyperlipidemia 03/14/2019  ? Precordial chest pain 03/14/2019  ? Former tobacco use 02/08/2019  ? CIDP (chronic inflammatory demyelinating polyneuropathy) ([place]) 01/12/2018  ? Positive ANA (antinuclear antibody) 12/24/2015  ? Generalized anxiety disorder 12/24/2015  ? Dyslipidemia 10/09/2014  ? Cervical spondylolysis 10/09/2014  ? DVT (deep venous thrombosis) ([place]) 08/22/2013  ? LGSIL (low grade squamous intraepithelial lesion) on Pap smear 08/04/2013  ? Sleep apnea 05/21/2011  ?  ? ?Current Outpatient Medications on File Prior to Visit  ?Medication Sig Dispense Refill  ? amLODipine (NORVASC) 5 MG tablet Take 1 tablet by mouth daily. 30 tablet 3  ? atorvastatin (LIPITOR)  40 MG tablet Take 1 tablet (40 mg total) by mouth daily. Patient need a appointment for future refills. 30 tablet 0  ? gabapentin (NEURONTIN) 300 MG capsule Take 1 capsule by mouth in the morning and take 2 capsules at bedtime. 270 capsule 3  ? rivaroxaban (XARELTO) 20 MG TABS tablet TAKE 1 TABLET BY MOUTH ONCE A DAY 30 tablet 11  ? ?Current Facility-Administered Medications on File Prior to Visit  ?Medication Dose Route Frequency Provider Last Rate Last Admin  ? methylPREDNISolone sodium succinate (SOLU-MEDROL) 1,000 mg in sodium chloride 0.9 % 50 mL IVPB  1,000 mg Intravenous Q28 days Alda Berthold, DO 58 mL/hr at 01/27/18 0928 1,000 mg at 01/27/18 7846  ? ? ?No Known Allergies ? ?Social History  ? ?Socioeconomic History  ? Marital status: Single  ?  Spouse name: Not on file  ? Number of children: 4  ? Years of education: 95  ? Highest education level: Not on file  ?Occupational History  ? Occupation: unemployed  ?Tobacco Use  ? Smoking status: Light Smoker  ?  Packs/day: 0.00  ?  Years: 40.00  ?  Pack years: 0.00  ?  Types: Cigarettes  ? Smokeless tobacco: Never  ? Tobacco comments:  ?  Patient reports smoking 2-3 cigarettes a day   ?Vaping Use  ? Vaping Use: Never used  ?Substance and Sexual Activity  ? Alcohol use: No  ? Drug use: No  ? Sexual  activity: Yes  ?Other Topics Concern  ? Not on file  ?Social History Narrative  ? Lives alone in a one story home.  Has 4 children.  Works as a Education officer, environmental at [employer].  Education: GED  ? Recent travel to [place] 01/2011  ? Right handed  ? ?Social Determinants of Health  ? ?Financial Resource Strain: Not on file  ?Food Insecurity: Not on file  ?Transportation Needs: Not on file  ?Physical Activity: Not on file  ?Stress: Not on file  ?Social Connections: Not on file  ?Intimate Partner Violence: Not on file  ? ? ?Family History  ?Problem Relation Age of Onset  ? Emphysema Father   ? Breast cancer Mother   ? Breast cancer Maternal Grandmother   ? Heart  disease Sister   ? Cancer Sister   ? Diabetes Brother   ? ? ?Past Surgical History:  ?Procedure Laterality Date  ? CESAREAN SECTION    ? one previous  ? right arm fracture    ? TUBAL LIGATION    ? ? ?ROS: ?Review of Systems ?Negative except as stated above ? ?PHYSICAL EXAM: ?BP 130/79   Pulse 94   Resp 16   Ht '5\' 1"'$  (1.549 m)   Wt 185 lb 9.6 oz (84.2 kg)   SpO2 95%   BMI 35.07 kg/m?   ?Wt Readings from Last 3 Encounters:  ?01/27/22 185 lb 9.6 oz (84.2 kg)  ?10/24/21 181 lb (82.1 kg)  ?10/07/21 181 lb 3.2 oz (82.2 kg)  ? ? ?Physical Exam ? ?General appearance - alert, well appearing, older AAF and in no distress ?Mental status - normal mood, behavior, speech, dress, motor activity, and thought processes ?Neck - supple, no significant adenopathy ?Chest - clear to auscultation, no wheezes, rales or rhonchi, symmetric air entry ?Heart - normal rate, regular rhythm, normal S1, S2, no murmurs, rubs, clicks or gallops ?Extremities - peripheral pulses normal, no pedal edema, no clubbing or cyanosis ? ? ? ?  Latest Ref Rng & Units 10/07/2021  ?  9:20 AM 06/20/2021  ?  3:39 PM 10/05/2020  ?  9:26 AM  ?CMP  ?Glucose 70 - 99 mg/dL 125   86   117    ?BUN 8 - 23 mg/dL '9   10   10    '$ ?Creatinine 0.44 - 1.00 mg/dL 1.11   1.13   1.11    ?Sodium 135 - 145 mmol/L 138   139   138    ?Potassium 3.5 - 5.1 mmol/L 3.9   5.1   4.1    ?Chloride 98 - 111 mmol/L 105   101   105    ?CO2 22 - 32 mmol/L '24   24   23    '$ ?Calcium 8.9 - 10.3 mg/dL 9.2   9.9   9.4    ?Total Protein 6.0 - 8.5 g/dL  8.5   9.4    ?Total Bilirubin 0.0 - 1.2 mg/dL  0.6   1.3    ?Alkaline Phos 44 - 121 IU/L  124   117    ?AST 0 - 40 IU/L  21   18    ?ALT 0 - 32 IU/L  15   13    ? ?Lipid Panel  ?   ?Component Value Date/Time  ? CHOL 222 (H) 06/20/2021 1539  ? TRIG 335 (H) 06/20/2021 1539  ? HDL 37 (L) 06/20/2021 1539  ? CHOLHDL 6.0 (H) 06/20/2021 1539  ? CHOLHDL 7.2 (  H) 12/24/2015 1109  ? VLDL 74 (H) 12/24/2015 1109  ? LDLCALC 126 (H) 06/20/2021 1539  ? ? ?CBC ?    ?Component Value Date/Time  ? WBC 9.2 10/07/2021 0920  ? RBC 4.06 10/07/2021 0920  ? HGB 12.0 10/07/2021 0920  ? HGB 13.0 06/20/2021 1539  ? HGB 12.3 08/11/2013 1046  ? HCT 37.2 10/07/2021 0920  ? HCT 40.1 06/20/2021 1539  ? HCT 37.0 08/11/2013 1046  ? PLT 360 10/07/2021 0920  ? PLT 355 06/20/2021 1539  ? MCV 91.6 10/07/2021 0920  ? MCV 92 06/20/2021 1539  ? MCV 91.2 08/11/2013 1046  ? MCH 29.6 10/07/2021 0920  ? MCHC 32.3 10/07/2021 0920  ? RDW 14.4 10/07/2021 0920  ? RDW 12.8 06/20/2021 1539  ? RDW 13.6 08/11/2013 1046  ? LYMPHSABS 2.8 10/07/2021 0920  ? LYMPHSABS 2.4 05/13/2017 0956  ? LYMPHSABS 1.7 08/11/2013 1046  ? MONOABS 0.6 10/07/2021 0920  ? MONOABS 0.6 08/11/2013 1046  ? EOSABS 0.3 10/07/2021 0920  ? EOSABS 0.2 05/13/2017 0956  ? BASOSABS 0.1 10/07/2021 0920  ? BASOSABS 0.0 05/13/2017 0956  ? BASOSABS 0.1 08/11/2013 1046  ? ? ?ASSESSMENT AND PLAN: ?1. Essential hypertension ?At goal ?Continue Norvasc and DASH ? ?2. Class 2 severe obesity due to excess calories with serious comorbidity and body mass index (BMI) of 35.0 to 35.9 in adult [facility]) ?Patient advised to eliminate sugary drinks from the diet, cut back on portion sizes especially of white carbohydrates, eat more white lean meat like chicken [place] and seafood instead of beef or pork and incorporate fresh fruits and vegetables into the diet daily. ?-pt declined referral to nutritionist ?-discussed ways she can exercise at home like walking in place for 15-30 mins 2-4 times a wk so that weather is not a barrier to exercise ? ?3. Stage 3a chronic kidney disease ([place]) ?GFR stable in 50s.   ?Discussed importance of good BP control ?Advise to avoid use of NSAIDs for long periods.  Currently not on NSAIDS ? ?4. Thrombophilia ([place]) ?Stable on Xarelto ? ?5. CIDP (chronic inflammatory demyelinating polyneuropathy) (HCC) ?Stable and managed by neurology ? ?6. Influenza vaccination declined ? ? ? ?Patient was given the opportunity to ask questions.  Patient  verbalized understanding of the plan and was able to repeat key elements of the plan.  ? ?This documentation was completed using Radio producer.  Any transcriptional errors are unintentional. ? ?No or

## 2022-01-27 NOTE — Patient Instructions (Signed)
Healthy Eating ?Following a healthy eating pattern may help you to achieve and maintain a healthy body weight, reduce the risk of chronic disease, and live a long and productive life. It is important to follow a healthy eating pattern at an appropriate calorie level for your body. Your nutritional needs should be met primarily through food by choosing a variety of nutrient-rich foods. ?What are tips for following this plan? ?Reading food labels ?Read labels and choose the following: ?Reduced or low sodium. ?Juices with 100% fruit juice. ?Foods with low saturated fats and high polyunsaturated and monounsaturated fats. ?Foods with whole grains, such as whole wheat, cracked wheat, [place] rice, and wild rice. ?Whole grains that are fortified with folic acid. This is recommended for women who are pregnant or who want to become pregnant. ?Read labels and avoid the following: ?Foods with a lot of added sugars. These include foods that contain [place] sugar, corn sweetener, corn syrup, dextrose, fructose, glucose, high-fructose corn syrup, honey, invert sugar, lactose, malt syrup, maltose, molasses, raw sugar, sucrose, trehalose, or turbinado sugar. ?Do not eat more than the following amounts of added sugar per day: ?6 teaspoons (25 g) for women. ?9 teaspoons (38 g) for men. ?Foods that contain processed or refined starches and grains. ?Refined grain products, such as white flour, degermed cornmeal, white bread, and white rice. ?Shopping ?Choose nutrient-rich snacks, such as vegetables, whole fruits, and nuts. Avoid high-calorie and high-sugar snacks, such as potato chips, fruit snacks, and candy. ?Use oil-based dressings and spreads on foods instead of solid fats such as butter, stick margarine, or cream cheese. ?Limit pre-made sauces, mixes, and "instant" products such as flavored rice, instant noodles, and ready-made pasta. ?Try more plant-protein sources, such as tofu, tempeh, black beans, edamame, lentils, nuts, and  seeds. ?Explore eating plans such as the Mediterranean diet or vegetarian diet. ?Cooking ?Use oil to saut? or stir-fry foods instead of solid fats such as butter, stick margarine, or lard. ?Try baking, boiling, grilling, or broiling instead of frying. ?Remove the fatty part of meats before cooking. ?Steam vegetables in water or broth. ?Meal planning ? ?At meals, imagine dividing your plate into fourths: ?One-half of your plate is fruits and vegetables. ?One-fourth of your plate is whole grains. ?One-fourth of your plate is protein, especially lean meats, poultry, eggs, tofu, beans, or nuts. ?Include low-fat dairy as part of your daily diet. ?Lifestyle ?Choose healthy options in all settings, including home, work, school, restaurants, or stores. ?Prepare your food safely: ?Wash your hands after handling raw meats. ?Keep food preparation surfaces clean by regularly washing with hot, soapy water. ?Keep raw meats separate from ready-to-eat foods, such as fruits and vegetables. ?Cook seafood, meat, poultry, and eggs to the recommended internal temperature. ?Store foods at safe temperatures. In general: ?Keep cold foods at 40?F (4.4?C) or below. ?Keep hot foods at 140?F (60?C) or above. ?Keep your freezer at 0?F (-17.8?C) or below. ?Foods are no longer safe to eat when they have been between the temperatures of 40?-140?F (4.4-60?C) for more than 2 hours. ?What foods should I eat? ?Fruits ?Aim to eat 2 cup-equivalents of fresh, canned (in natural juice), or frozen fruits each day. Examples of 1 cup-equivalent of fruit include 1 small apple, 8 large strawberries, 1 cup canned fruit, ? cup dried fruit, or 1 cup 100% juice. ?Vegetables ?Aim to eat 2?-3 cup-equivalents of fresh and frozen vegetables each day, including different varieties and colors. Examples of 1 cup-equivalent of vegetables include 2 medium carrots, 2 cups raw,  leafy greens, 1 cup chopped vegetable (raw or cooked), or 1 medium baked potato. ?Grains ?Aim to  eat 6 ounce-equivalents of whole grains each day. Examples of 1 ounce-equivalent of grains include 1 slice of bread, 1 cup ready-to-eat cereal, 3 cups popcorn, or ? cup cooked rice, pasta, or cereal. ?Meats and other proteins ?Aim to eat 5-6 ounce-equivalents of protein each day. Examples of 1 ounce-equivalent of protein include 1 egg, 1/2 cup nuts or seeds, or 1 tablespoon (16 g) peanut butter. A cut of meat or fish that is the size of a deck of cards is about 3-4 ounce-equivalents. ?Of the protein you eat each week, try to have at least 8 ounces come from seafood. This includes salmon, trout, herring, and anchovies. ?Dairy ?Aim to eat 3 cup-equivalents of fat-free or low-fat dairy each day. Examples of 1 cup-equivalent of dairy include 1 cup (240 mL) milk, 8 ounces (250 g) yogurt, 1? ounces (44 g) natural cheese, or 1 cup (240 mL) fortified soy milk. ?Fats and oils ?Aim for about 5 teaspoons (21 g) per day. Choose monounsaturated fats, such as canola and olive oils, avocados, peanut butter, and most nuts, or polyunsaturated fats, such as sunflower, corn, and soybean oils, walnuts, pine nuts, sesame seeds, sunflower seeds, and flaxseed. ?Beverages ?Aim for six 8-oz glasses of water per day. Limit coffee to three to five 8-oz cups per day. ?Limit caffeinated beverages that have added calories, such as soda and energy drinks. ?Limit alcohol intake to no more than 1 drink a day for nonpregnant women and 2 drinks a day for men. One drink equals 12 oz of beer (355 mL), 5 oz of wine (148 mL), or 1? oz of hard liquor (44 mL). ?Seasoning and other foods ?Avoid adding excess amounts of salt to your foods. Try flavoring foods with herbs and spices instead of salt. ?Avoid adding sugar to foods. ?Try using oil-based dressings, sauces, and spreads instead of solid fats. ?This information is based on general [place] nutrition guidelines. For more information, visit Build[employer].es. Exact amounts may vary based on your nutrition  needs. ?Summary ?A healthy eating plan may help you to maintain a healthy weight, reduce the risk of chronic diseases, and stay active throughout your life. ?Plan your meals. Make sure you eat the right portions of a variety of nutrient-rich foods. ?Try baking, boiling, grilling, or broiling instead of frying. ?Choose healthy options in all settings, including home, work, school, restaurants, or stores. ?This information is not intended to replace advice given to you by your health care provider. Make sure you discuss any questions you have with your health care provider. ?Document Revised: 06/18/2021 Document Reviewed: 06/18/2021 ?Elsevier Patient Education ? [place]. ? ?

## 2022-02-06 ENCOUNTER — Other Ambulatory Visit (HOSPITAL_COMMUNITY): Payer: Self-pay

## 2022-02-06 ENCOUNTER — Other Ambulatory Visit: Payer: Self-pay | Admitting: Cardiology

## 2022-02-06 ENCOUNTER — Other Ambulatory Visit: Payer: Self-pay | Admitting: Internal Medicine

## 2022-02-06 DIAGNOSIS — E785 Hyperlipidemia, unspecified: Secondary | ICD-10-CM

## 2022-02-06 DIAGNOSIS — I10 Essential (primary) hypertension: Secondary | ICD-10-CM

## 2022-02-06 MED ORDER — AMLODIPINE BESYLATE 5 MG PO TABS
5.0000 mg | ORAL_TABLET | Freq: Every day | ORAL | 3 refills | Status: DC
  Filled 2022-02-06: qty 30, 30d supply, fill #0
  Filled 2022-04-15: qty 30, 30d supply, fill #1

## 2022-02-07 ENCOUNTER — Other Ambulatory Visit (HOSPITAL_COMMUNITY): Payer: Self-pay

## 2022-03-20 ENCOUNTER — Encounter: Payer: Self-pay | Admitting: Hematology and Oncology

## 2022-04-15 ENCOUNTER — Other Ambulatory Visit (HOSPITAL_COMMUNITY): Payer: Self-pay

## 2022-04-16 ENCOUNTER — Other Ambulatory Visit (HOSPITAL_COMMUNITY): Payer: Self-pay

## 2022-05-21 NOTE — Telephone Encounter (Signed)
Dr. Maxie Barb office is requesting the current Neuro Pysch. Testing to be faxed over to his office. The last one they have is from 2019.    Please contact with any questions.

## 2022-05-29 ENCOUNTER — Encounter: Payer: Self-pay | Admitting: Internal Medicine

## 2022-05-29 ENCOUNTER — Other Ambulatory Visit (HOSPITAL_COMMUNITY): Payer: Self-pay

## 2022-05-29 ENCOUNTER — Ambulatory Visit: Payer: Medicaid Other | Attending: Internal Medicine | Admitting: Internal Medicine

## 2022-05-29 VITALS — BP 130/68 | HR 95 | Ht 62.0 in | Wt 179.2 lb

## 2022-05-29 DIAGNOSIS — N1831 Chronic kidney disease, stage 3a: Secondary | ICD-10-CM

## 2022-05-29 DIAGNOSIS — G6181 Chronic inflammatory demyelinating polyneuritis: Secondary | ICD-10-CM

## 2022-05-29 DIAGNOSIS — E66811 Obesity, class 1: Secondary | ICD-10-CM

## 2022-05-29 DIAGNOSIS — E782 Mixed hyperlipidemia: Secondary | ICD-10-CM | POA: Diagnosis not present

## 2022-05-29 DIAGNOSIS — E669 Obesity, unspecified: Secondary | ICD-10-CM | POA: Diagnosis not present

## 2022-05-29 DIAGNOSIS — I2782 Chronic pulmonary embolism: Secondary | ICD-10-CM

## 2022-05-29 DIAGNOSIS — I10 Essential (primary) hypertension: Secondary | ICD-10-CM | POA: Diagnosis not present

## 2022-05-29 LAB — POCT GLYCOSYLATED HEMOGLOBIN (HGB A1C): HbA1c, POC (controlled diabetic range): 5.3 % (ref 0.0–7.0)

## 2022-05-29 LAB — GLUCOSE, POCT (MANUAL RESULT ENTRY): POC Glucose: 126 mg/dl — AB (ref 70–99)

## 2022-05-29 MED ORDER — AMLODIPINE BESYLATE 5 MG PO TABS
5.0000 mg | ORAL_TABLET | Freq: Every day | ORAL | 1 refills | Status: DC
  Filled 2022-05-29: qty 90, 90d supply, fill #0
  Filled 2022-09-29: qty 90, 90d supply, fill #1

## 2022-05-29 MED ORDER — ATORVASTATIN CALCIUM 40 MG PO TABS
40.0000 mg | ORAL_TABLET | Freq: Every day | ORAL | 1 refills | Status: DC
  Filled 2022-05-29: qty 90, 90d supply, fill #0
  Filled 2022-09-29: qty 90, 90d supply, fill #1

## 2022-05-29 NOTE — Progress Notes (Signed)
Requesting RF on Atorvastatin.

## 2022-05-29 NOTE — Progress Notes (Signed)
Patient ID: HEIDEE AUDI, female    DOB: 06-03-1956  MRN: [id number]  CC: chronic ds management  Subjective: Tammy [name] is a 66 y.o. 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 CKD 3    HTN: Reports compliance with Norvasc 5 mg daily and with salt restriction.  She takes it at nights.  Denies any chest pain, shortness of breath, lower extremity edema, headaches or dizziness. Out of Lipitor x 1 mth.  Tolerating medication without significant cramps.  Needs RF.  CKD 3: GFR remains in the 50s with most recent reading at 56. Not on NSAIDs  Obesity: loss 6 lbs since March 2023.  Not drinking [facility] as much.  On last visit she reports snacking on chocolate, ice cream.  She has cut back on frequency of eating ice cream.  Eating more fruits -just started walking 2x/wk with her grandkids 2 blocks to the store.  CIPD: Continues to be followed by Dr. Posey Pronto and receives IVIG every 4 weeks.  Feels her symptoms are stable. Next appt is 10/2022  History of DVT/PE: Taking and tolerating Xarelto.  No bruising or bleeding.  HM: She declines Shingrix vaccine. 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   Class 2 severe obesity due to excess calories with serious comorbidity and body mass index (BMI) of 35.0 to 35.9 in adult ([place]) 01/27/2022   Preventive measure 10/05/2020   CKD (chronic kidney disease) stage 3, GFR 30-59 ml/min ([place]) 10/06/2019   Essential hypertension 03/14/2019   Hyperlipidemia 03/14/2019   Precordial chest pain 03/14/2019   Former tobacco use 02/08/2019   CIDP (chronic inflammatory demyelinating polyneuropathy) ([place]) 01/12/2018   Positive ANA (antinuclear antibody) 12/24/2015   Generalized anxiety disorder 12/24/2015   Dyslipidemia 10/09/2014   Cervical  spondylolysis 10/09/2014   DVT (deep venous thrombosis) ([place]) 08/22/2013   LGSIL (low grade squamous intraepithelial lesion) on Pap smear 08/04/2013   Sleep apnea 05/21/2011     Current Outpatient Medications on File Prior to Visit  Medication Sig Dispense Refill   amLODipine (NORVASC) 5 MG tablet Take 1 tablet by mouth daily. 30 tablet 3   atorvastatin (LIPITOR) 40 MG tablet Take 1 tablet (40 mg total) by mouth daily. Patient need a appointment for future refills. 30 tablet 0   gabapentin (NEURONTIN) 300 MG capsule Take 1 capsule by mouth in the morning and take 2 capsules at bedtime. 270 capsule 3   rivaroxaban (XARELTO) 20 MG TABS tablet TAKE 1 TABLET BY MOUTH ONCE A DAY 30 tablet 11   Current Facility-Administered Medications on File Prior to Visit  Medication Dose Route Frequency Provider Last Rate Last Admin   methylPREDNISolone sodium succinate (SOLU-MEDROL) 1,000 mg in sodium chloride 0.9 % 50 mL IVPB  1,000 mg Intravenous Q28 days Alda Berthold, DO 58 mL/hr at 01/27/18 0928 1,000 mg at 01/27/18 7829    Not on File  Social History   Socioeconomic History   Marital status: Single    Spouse name: Not on file   Number of children: 4   Years of education: 12   Highest education level: Not on file  Occupational History   Occupation: unemployed  Tobacco Use   Smoking status: Light Smoker    Packs/day: 0.00    Years: 40.00    Total  pack years: 0.00    Types: Cigarettes   Smokeless tobacco: Never   Tobacco comments:    Patient reports smoking 2-3 cigarettes a day   Vaping Use   Vaping Use: Never used  Substance and Sexual Activity   Alcohol use: No   Drug use: No   Sexual activity: Yes  Other Topics Concern   Not on file  Social History Narrative   Lives alone in a one story home.  Has 4 children.  Works as a Education officer, environmental at [employer].  Education: GED   Recent travel to [place] 01/2011   Right handed   Social Determinants of Health   Financial  Resource Strain: Not on file  Food Insecurity: Not on file  Transportation Needs: Not on file  Physical Activity: Sufficiently Active (10/01/2017)   Exercise Vital Sign    Days of Exercise per Week: 7 days    Minutes of Exercise per Session: 110 min  Stress: Stress Concern Present (10/01/2017)   [place]    Feeling of Stress : Very much  Social Connections: Moderately Isolated (10/01/2017)   Social Connection and Isolation Panel [NHANES]    Frequency of Communication with Friends and Family: More than three times a week    Frequency of Social Gatherings with Friends and Family: More than three times a week    Attends Religious Services: Never    Marine scientist or Organizations: No    Attends Archivist Meetings: Never    Marital Status: Divorced  Human resources officer Violence: Not At Risk (10/01/2017)   Humiliation, Afraid, Rape, and Kick questionnaire    Fear of Current or Ex-Partner: No    Emotionally Abused: No    Physically Abused: No    Sexually Abused: No    Family History  Problem Relation Age of Onset   Emphysema Father    Breast cancer Mother    Breast cancer Maternal Grandmother    Heart disease Sister    Cancer Sister    Diabetes Brother     Past Surgical History:  Procedure Laterality Date   CESAREAN SECTION     one previous   right arm fracture     TUBAL LIGATION      ROS: Review of Systems Negative except as stated above  PHYSICAL EXAM: BP 130/68   Pulse 95   Ht '5\' 2"'$  (1.575 m)   Wt 179 lb 3.2 oz (81.3 kg)   SpO2 97%   BMI 32.78 kg/m   Wt Readings from Last 3 Encounters:  05/29/22 179 lb 3.2 oz (81.3 kg)  01/27/22 185 lb 9.6 oz (84.2 kg)  10/24/21 181 lb (82.1 kg)    Physical Exam  General appearance - alert, well appearing, older African-American female and in no distress Mental status - normal mood, behavior, speech, dress, motor activity, and thought  processes Neck - supple, no significant adenopathy Chest - clear to auscultation, no wheezes, rales or rhonchi, symmetric air entry Heart - normal rate, regular rhythm, normal S1, S2, no murmurs, rubs, clicks or gallops Extremities - peripheral pulses normal, no pedal edema, no clubbing or cyanosis    Results for orders placed or performed in visit on 05/29/22  POCT glucose (manual entry)  Result Value Ref Range   POC Glucose 126 (A) 70 - 99 mg/dl  POCT glycosylated hemoglobin (Hb A1C)  Result Value Ref Range   Hemoglobin A1C     HbA1c  POC (<> result, manual entry)     HbA1c, POC (prediabetic range)     HbA1c, POC (controlled diabetic range) 5.3 0.0 - 7.0 %       Latest Ref Rng & Units 10/07/2021    9:20 AM 06/20/2021    3:39 PM 10/05/2020    9:26 AM  CMP  Glucose 70 - 99 mg/dL 125  86  117   BUN 8 - 23 mg/dL '9  10  10   '$ Creatinine 0.44 - 1.00 mg/dL 1.11  1.13  1.11   Sodium 135 - 145 mmol/L 138  139  138   Potassium 3.5 - 5.1 mmol/L 3.9  5.1  4.1   Chloride 98 - 111 mmol/L 105  101  105   CO2 22 - 32 mmol/L '24  24  23   '$ Calcium 8.9 - 10.3 mg/dL 9.2  9.9  9.4   Total Protein 6.0 - 8.5 g/dL  8.5  9.4   Total Bilirubin 0.0 - 1.2 mg/dL  0.6  1.3   Alkaline Phos 44 - 121 IU/L  124  117   AST 0 - 40 IU/L  21  18   ALT 0 - 32 IU/L  15  13    Lipid Panel     Component Value Date/Time   CHOL 222 (H) 06/20/2021 1539   TRIG 335 (H) 06/20/2021 1539   HDL 37 (L) 06/20/2021 1539   CHOLHDL 6.0 (H) 06/20/2021 1539   CHOLHDL 7.2 (H) 12/24/2015 1109   VLDL 74 (H) 12/24/2015 1109   LDLCALC 126 (H) 06/20/2021 1539    CBC    Component Value Date/Time   WBC 9.2 10/07/2021 0920   RBC 4.06 10/07/2021 0920   HGB 12.0 10/07/2021 0920   HGB 13.0 06/20/2021 1539   HGB 12.3 08/11/2013 1046   HCT 37.2 10/07/2021 0920   HCT 40.1 06/20/2021 1539   HCT 37.0 08/11/2013 1046   PLT 360 10/07/2021 0920   PLT 355 06/20/2021 1539   MCV 91.6 10/07/2021 0920   MCV 92 06/20/2021 1539   MCV 91.2  08/11/2013 1046   MCH 29.6 10/07/2021 0920   MCHC 32.3 10/07/2021 0920   RDW 14.4 10/07/2021 0920   RDW 12.8 06/20/2021 1539   RDW 13.6 08/11/2013 1046   LYMPHSABS 2.8 10/07/2021 0920   LYMPHSABS 2.4 05/13/2017 0956   LYMPHSABS 1.7 08/11/2013 1046   MONOABS 0.6 10/07/2021 0920   MONOABS 0.6 08/11/2013 1046   EOSABS 0.3 10/07/2021 0920   EOSABS 0.2 05/13/2017 0956   BASOSABS 0.1 10/07/2021 0920   BASOSABS 0.0 05/13/2017 0956   BASOSABS 0.1 08/11/2013 1046    ASSESSMENT AND PLAN:  1. Essential hypertension At goal.  Advised to continue low-salt diet and amlodipine 5 mg daily. - CBC - Comprehensive metabolic panel  2. Obesity (BMI 30.0-34.9) Commended on weight loss so far. Discussed healthier snacks.  Encouraged her to to continue to cut back on [facility]. Encouraged her to increase her walking to 3 days a week for 30 minutes.  3. Stage 3a chronic kidney disease (HCC) Stable.  We will continue to monitor.  4. Mixed hyperlipidemia Continue atorvastatin.  Refill sent to her pharmacy - Lipid panel  5. CIDP (chronic inflammatory demyelinating polyneuropathy) (HCC) Stable and followed by neurology - CBC  6. Other chronic pulmonary embolism without acute cor pulmonale (HCC) Continue Xarelto.  Advised to report any bruising or bleeding.    Patient was given the opportunity to ask questions.  Patient verbalized  understanding of the plan and was able to repeat key elements of the plan.   This documentation was completed using Radio producer.  Any transcriptional errors are unintentional.  Orders Placed This Encounter  Procedures   CBC   Comprehensive metabolic panel   Lipid panel     Requested Prescriptions    No prescriptions requested or ordered in this encounter    No follow-ups on file.  Karle Plumber, MD, FACP

## 2022-05-30 LAB — COMPREHENSIVE METABOLIC PANEL
ALT: 12 IU/L (ref 0–32)
AST: 17 IU/L (ref 0–40)
Albumin/Globulin Ratio: 1.4 (ref 1.2–2.2)
Albumin: 4.3 g/dL (ref 3.9–4.9)
Alkaline Phosphatase: 120 IU/L (ref 44–121)
BUN/Creatinine Ratio: 9 — ABNORMAL LOW (ref 12–28)
BUN: 10 mg/dL (ref 8–27)
Bilirubin Total: 0.6 mg/dL (ref 0.0–1.2)
CO2: 23 mmol/L (ref 20–29)
Calcium: 9.4 mg/dL (ref 8.7–10.3)
Chloride: 106 mmol/L (ref 96–106)
Creatinine, Ser: 1.13 mg/dL — ABNORMAL HIGH (ref 0.57–1.00)
Globulin, Total: 3 g/dL (ref 1.5–4.5)
Glucose: 114 mg/dL — ABNORMAL HIGH (ref 70–99)
Potassium: 4.7 mmol/L (ref 3.5–5.2)
Sodium: 143 mmol/L (ref 134–144)
Total Protein: 7.3 g/dL (ref 6.0–8.5)
eGFR: 54 mL/min/{1.73_m2} — ABNORMAL LOW (ref >59)

## 2022-05-30 LAB — CBC
Hematocrit: 39.3 % (ref 34.0–46.6)
Hemoglobin: 12.6 g/dL (ref 11.1–15.9)
MCH: 29.6 pg (ref 26.6–33.0)
MCHC: 32.1 g/dL (ref 31.5–35.7)
MCV: 92 fL (ref 79–97)
Platelets: 307 10*3/uL (ref 150–450)
RBC: 4.26 x10E6/uL (ref 3.77–5.28)
RDW: 12.5 % (ref 11.7–15.4)
WBC: 8.1 10*3/uL (ref 3.4–10.8)

## 2022-05-30 LAB — LIPID PANEL
Chol/HDL Ratio: 5 ratio — ABNORMAL HIGH (ref 0.0–4.4)
Cholesterol, Total: 181 mg/dL (ref 100–199)
HDL: 36 mg/dL — ABNORMAL LOW (ref >39)
LDL Chol Calc (NIH): 108 mg/dL — ABNORMAL HIGH (ref 0–99)
Triglycerides: 211 mg/dL — ABNORMAL HIGH (ref 0–149)
VLDL Cholesterol Cal: 37 mg/dL (ref 5–40)

## 2022-06-01 ENCOUNTER — Other Ambulatory Visit: Payer: Self-pay | Admitting: Internal Medicine

## 2022-06-01 MED ORDER — ATORVASTATIN CALCIUM 20 MG PO TABS
ORAL_TABLET | ORAL | 1 refills | Status: DC
  Filled 2022-06-01 – 2023-01-02 (×2): qty 90, 90d supply, fill #0

## 2022-06-02 ENCOUNTER — Other Ambulatory Visit (HOSPITAL_COMMUNITY): Payer: Self-pay

## 2022-06-10 ENCOUNTER — Other Ambulatory Visit (HOSPITAL_COMMUNITY): Payer: Self-pay

## 2022-06-10 ENCOUNTER — Other Ambulatory Visit: Payer: Self-pay | Admitting: Neurology

## 2022-06-10 ENCOUNTER — Other Ambulatory Visit: Payer: Self-pay

## 2022-06-11 ENCOUNTER — Other Ambulatory Visit (HOSPITAL_COMMUNITY): Payer: Self-pay

## 2022-06-11 MED ORDER — GABAPENTIN 300 MG PO CAPS
ORAL_CAPSULE | ORAL | 3 refills | Status: DC
Start: 2022-06-11 — End: 2022-09-03
  Filled 2022-06-11: qty 270, fill #0
  Filled 2022-07-17: qty 270, 90d supply, fill #0

## 2022-07-17 ENCOUNTER — Other Ambulatory Visit (HOSPITAL_COMMUNITY): Payer: Self-pay

## 2022-07-18 ENCOUNTER — Other Ambulatory Visit (HOSPITAL_COMMUNITY): Payer: Self-pay

## 2022-08-13 ENCOUNTER — Telehealth: Payer: Self-pay | Admitting: Neurology

## 2022-08-13 NOTE — Telephone Encounter (Signed)
[employer] called in stating the pt's Gammagard injection needs prior authorization

## 2022-08-22 ENCOUNTER — Other Ambulatory Visit (HOSPITAL_COMMUNITY): Payer: Self-pay

## 2022-08-26 ENCOUNTER — Other Ambulatory Visit (HOSPITAL_COMMUNITY): Payer: Self-pay

## 2022-08-28 NOTE — Telephone Encounter (Signed)
Appears [facility] manages Gammagard PA. Called [facility], they advised the [facility] PA expires on 11/02/22 according to their records.  They stated they will follow up with insurance, but will need updated chart note if a new PA is needed. Patient's next appt is 10/17/22, rep inquired if patient could be seen sooner.  [facility]# [phone]

## 2022-08-29 NOTE — Telephone Encounter (Signed)
Sure, we can reschedule her to sooner appointment.

## 2022-09-03 ENCOUNTER — Encounter: Payer: Self-pay | Admitting: Neurology

## 2022-09-03 ENCOUNTER — Ambulatory Visit (INDEPENDENT_AMBULATORY_CARE_PROVIDER_SITE_OTHER): Payer: Medicaid Other | Admitting: Neurology

## 2022-09-03 ENCOUNTER — Other Ambulatory Visit (HOSPITAL_COMMUNITY): Payer: Self-pay

## 2022-09-03 VITALS — BP 156/88 | HR 101 | Ht 62.0 in | Wt 177.0 lb

## 2022-09-03 DIAGNOSIS — G6181 Chronic inflammatory demyelinating polyneuritis: Secondary | ICD-10-CM | POA: Diagnosis not present

## 2022-09-03 MED ORDER — GABAPENTIN 300 MG PO CAPS
ORAL_CAPSULE | ORAL | 3 refills | Status: DC
Start: 2022-09-03 — End: 2023-12-09
  Filled 2022-09-03: qty 360, fill #0
  Filled 2022-11-17: qty 360, 90d supply, fill #0
  Filled 2023-04-13: qty 360, 90d supply, fill #1
  Filled 2023-08-03: qty 360, 90d supply, fill #2

## 2022-09-03 NOTE — Patient Instructions (Addendum)
Nerve testing of the arms  Increase gabapentin to '900mg'$  at bedtime and continue '300mg'$  in the morning  Continue IVIG every 4 weeks  Return to clinic in 4 months

## 2022-09-03 NOTE — Progress Notes (Signed)
Follow-up Visit   Date: 09/03/22    Tammy [name] MRN: [id number] DOB: 08/09/1956   Interim History: Tammy [name] is a 66 y.o. 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  The patient was accompanied to the clinic by self.  IMPRESSION/PLAN: Chronic inflammatory demyelinating polyradiculoneuropathy (diagnosed 2018) presenting with paresthesia and weakness in the upper extremities, supported by EMG and CSF.  IVIG started in 07/2018 every 21 days, which has stabilized symptoms.  Repeat EMG from 2020 reviewed and shows progressive neuropathy, thus IVIG has been continued every 4 weeks.  - Repeat NCS/EMG of the arms to assess for ongoing active neuropathy  - Increase gabapentin to 937m at bedtime, continue 3031min the morning  - Continue IVIG 1k/kg every 4 weeks  - Consider reducing frequency of IVIG, if EMG shows stable neuropathy  Return to clinic in 4 months   --------------------------------------------------------------------------- History of present illness: Starting around 2018, she began having numbness and tingling of the hands. She has some weakness of her grip, such as holding a phone or opening jars.  Symptoms are worse at night time and often wake her up from sleeping.  She denies any chronic neck pain.  She takes gabapentin 30067mhree times daily and nortriptyline 52m55mwhich does not provide any relief.   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 Because of clinical suspicion for CIDP, she had induction dose of IVMP and has noticed some improvement in her burning sensation.  NCS/EMG of the legs was normal.  Labs also indicated thiamine deficiency and she was started on supplementation.  She was  switched to home IVIG in September 2019 due to lack of benefit on steroids.  She has noticed mild improvement only.  Repeat NCS/EMG in 2020 shows progressive neuropathy.  UPDATE 09/03/2022:  She is here for follow-up visit.  She continues to have burning and tingling of the hands, which is worse at night time.  She wld like to increase the doseof night time gabapentin.  She currently takes gabapentin 300mg32mthe AM and 600mg 93medtime.  No new weakness, imbalance or falls.  She continues to receive IVIG every 28 days and she feels that it helps.      Medications:  Current Outpatient Medications on File Prior to Visit  Medication Sig Dispense Refill   amLODipine (NORVASC) 5 MG tablet Take 1 tablet by mouth daily. 90 tablet 1   atorvastatin (LIPITOR) 40 MG tablet Take 1 tablet (40 mg total) by mouth daily. Patient need a appointment for future refills. 90 tablet 1   gabapentin (NEURONTIN) 300 MG capsule Take 1 capsule by mouth in the morning and take 2 capsules at bedtime. 270 capsule 3   rivaroxaban (XARELTO) 20 MG TABS tablet TAKE 1 TABLET BY MOUTH ONCE A DAY 30 tablet 11   atorvastatin (LIPITOR) 20 MG tablet Take 1 tablet by mouth daily with a 40 mg tablet (Patient not taking: Reported on 09/03/2022) 90 tablet 1   Current Facility-Administered Medications on File Prior to Visit  Medication Dose Route Frequency Provider Last Rate Last Admin   methylPREDNISolone sodium succinate (SOLU-MEDROL) 1,000 mg in sodium chloride 0.9 % 50 mL IVPB  1,000 mg Intravenous Q28 days Jordynn Marcella,Narda Amber 58 mL/hr at 01/27/18 0928 1,000  mg at 01/27/18 0174    Allergies: No Known Allergies  Vital Signs:  BP (!) 156/88   Pulse (!) 101   Ht _0  (1.575 m)   Wt 177 lb (80.3 kg)   SpO2 99%   BMI 32.37 kg/m   Neurological Exam: MENTAL STATUS including orientation to time, place, person, recent and remote memory, attention span and concentration, language, and fund of knowledge is normal.  Speech is not  dysarthric.  CRANIAL NERVES:  Pupils equal round and reactive to light.  Normal conjugate, extra-ocular eye movements in all directions of gaze.  No ptosis.   Face is symmetric.   MOTOR:  Motor strength is 5/5 in all extremities, except intrinsic hand muscles 5-/5.  No atrophy, fasciculations or abnormal movements.  No pronator drift.  Tone is normal.    MSRs:  Reflexes are 2+/4 throughout  SENSORY:  Reduced sensation to temperature over the palmer surface bilaterally. Vibration is intact throughout  COORDINATION/GAIT:   Gait narrow based and stable.    Data: NCS/EMG of the arms 10/15/2017:  The electrophysiologic findings are most consistent with a subacute sensorimotor polyneuropathy, axon loss and demyelinating in type, affecting the upper extremities; these findings are moderate in degree electrically.  NCS/EMG of the legs 10/29/2017:  Normal  CSF 11/10/2017:  R1 W1 G75 P85*    IgG 0.64, MBP < 2, OCB + CSF and serum, cytology negative, ACE 4  Labs 10/29/2017:  ESR 72*, CRP <0.3, vitamin B12 434, vitamin B1 7*, copper 153, folate 15.6, MMA 122, ANA 1:80 (speckled), SPEP with IFE no M protein, ACE 77*, heavy metal screen  Labs 01/19/2019:  Vitamin B1 8  MRI cervical spine 01/20/2018: 1. Stable mild right foraminal narrowing at C6-7. 2. Slight progression of uncovertebral disease and mild right foraminal narrowing at C5-6. 3. No focal cord signal abnormality or thickening of nerve roots.      Thank you for allowing me to participate in patient's care.  If I can answer any additional questions, I would be pleased to do so.    Sincerely,    Ryin Schillo K. Posey Pronto, DO

## 2022-09-22 ENCOUNTER — Telehealth: Payer: Self-pay | Admitting: Neurology

## 2022-09-22 NOTE — Telephone Encounter (Signed)
I believe her insurance needs updated notes to authorize further infusions.  She is scheduled to see me later this month.  We can talk about this at her follow-up.  In the meantime, we can contact [facility] to see if there is anything else causing IVIG delay.

## 2022-09-22 NOTE — Telephone Encounter (Signed)
Pt called about an infusion. Stated she has not had it this month and wants to know if she can get it. I told [place], I would call her back  after I make sure I am on the same page as Dr. Posey Pronto.

## 2022-09-23 NOTE — Telephone Encounter (Signed)
Sent Message to [facility]) for fax number to send in updated notes. Pending response.

## 2022-09-24 NOTE — Telephone Encounter (Signed)
Called patient and informed her that her insurance is wanting updated notes. I informed her that I have not heard back from [facility], but we are going to go ahead and fax her notes over so Insurance can have her updated notes. Informed patient that insurances are wanting updated notes sooner which is causing a delay. I informed patient of Dr. Serita Grit advice and informed her if I hear anything from [facility] I will call her right away. Patient verbalized understanding and had no further questions or concerns.

## 2022-09-29 ENCOUNTER — Telehealth: Payer: Self-pay | Admitting: Neurology

## 2022-09-29 ENCOUNTER — Other Ambulatory Visit (HOSPITAL_COMMUNITY): Payer: Self-pay

## 2022-09-29 NOTE — Telephone Encounter (Signed)
Received notification from [employer]: We did receive the clinical notes from the 09-03-22 visit.  They were uploaded to her record on 09-24-22.  I will send a note to the PA team to get this reviewed and authorization submitted.

## 2022-09-29 NOTE — Telephone Encounter (Signed)
Pt said she missed a call from the office today. Would like to know what is going on with her infusion

## 2022-09-29 NOTE — Telephone Encounter (Signed)
Updated office notes were faxed when I previously spoke to patient on 09/22/22 letting her know where we stand with her Infusions. Still waiting to hear back from her insurance and [employer]. Our main contact is out on vacation until 10/07/22. Will contact [employer] again to see where we stand and call patient back.

## 2022-09-30 ENCOUNTER — Other Ambulatory Visit (HOSPITAL_COMMUNITY): Payer: Self-pay

## 2022-09-30 NOTE — Telephone Encounter (Signed)
Called patient and left a detailed message per DPR that all paperwork for her infusion has been submitted and we are waiting for authorization from her insurance. Left contact information if patient had any further questions.

## 2022-10-02 ENCOUNTER — Ambulatory Visit (INDEPENDENT_AMBULATORY_CARE_PROVIDER_SITE_OTHER): Payer: Medicaid Other | Admitting: Neurology

## 2022-10-02 DIAGNOSIS — G6181 Chronic inflammatory demyelinating polyneuritis: Secondary | ICD-10-CM

## 2022-10-02 NOTE — Procedures (Signed)
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  NCV & EMG Findings: Extensive electrodiagnostic testing of the right upper extremity and additional studies of the left shows:  Bilateral median sensory responses are absent.  Bilateral ulnar sensory responses are prolonged (R6.7, L6.3 ms) with normal amplitude, which is an interval improvement compared to prior study on 10/18/2019..  Bilateral radial sensory responses are within normal limits. Right median motor response is severely prolonged _R11.0 ms), however amplitude is within normal limits and improved from prior study.  Left median motor response is severely prolonged (L16.6 ms) and severely reduced (L1.2 mV).  Right ulnar motor response shows prolonged latency (3.8 ms), reduced amplitude, and slowed conduction velocity (33-46 m/s).  There has been a reduction in the left ulnar motor amplitude (4.0 mV) with conduction block and temporal dispersion, and slowed conduction velocity across the elbow (31 m/s). Active on chronic chronic motor axonal loss changes are seen affecting the distal hand muscles, which is worse in the right hand.  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   ___________________________ Arvin Collard Tammy Sacco,DO    Nerve Conduction Studies Anti Sensory Summary Table   Stim Site NR Peak (ms) Norm Peak (ms) O-P Amp (V) Norm O-P Amp  Left Median Anti Sensory (2nd Digit)  34C  Wrist  NR  <3.8  >10  Right Median Anti Sensory (2nd Digit)  34C  Wrist NR  <3.8  >10  Left Radial Anti Sensory (Base 1st Digit)  34C  Wrist    2.6 <2.8 26.6 >10  Right Radial Anti Sensory (Base 1st Digit)  34C  Wrist    2.2 <2.8 42.7 >10  Left Ulnar Anti Sensory (5th Digit)  34C  Wrist    6.3 <3.2 11.4 >5  Right Ulnar Anti Sensory (5th Digit)  34C  Wrist    6.7 <3.2 8.5 >5   Motor Summary Table   Stim Site NR Onset (ms) Norm Onset (ms) O-P Amp (mV) Norm O-P Amp Site1 Site2 Delta-0 (ms) Dist (cm) Vel (m/s) Norm Vel (m/s)  Left Median Motor (Abd Poll Brev)  34C  Wrist    16.6 <4.0 1.2 >5 Elbow Wrist 4.5 27.0 60 >50  Elbow    21.1  1.1         Right Median Motor (Abd Poll Brev)  34C  Wrist    11.0 <4.0 5.6 >5 Elbow Wrist 5.7 30.0 53 >50  Elbow    16.7  5.0         Left Ulnar Motor (Abd Dig Minimi)  34C  Wrist    2.3 <3.1 4.0 >7 B Elbow Wrist 4.4 22.0 50 >50  B Elbow    6.7  3.7  A Elbow B Elbow 3.2 10.0 31 >50  A Elbow    9.9  3.6         Right Ulnar Motor (Abd Dig Minimi)  34C  Wrist    3.8 <3.1 1.4 >7 B Elbow Wrist 4.8 22.0 46 >50  B Elbow  8.6  0.5  A Elbow B Elbow 3.0 10.0 33 >50  A Elbow    11.6  0.4          EMG   Side Muscle Ins Act Fibs Fasc Recrt Dur. Amp. Poly. Activation Comment  Right 1stDorInt Nml 2+ Nml 3- 1+ 1+ 1+ Nml N/A  Right Abd Poll Brev Nml Nml Nml 2- 1+ 1+ 1+ Nml N/A  Right Ext Indicis Nml 1+ Nml 3- 1+ 1+ 1+ Nml N/A  Right PronatorTeres Nml Nml Nml Nml Nml Nml Nml Nml N/A  Right Biceps Nml Nml Nml Nml Nml Nml Nml Nml N/A  Right Triceps Nml Nml Nml Nml Nml Nml Nml Nml N/A  Right Deltoid Nml Nml Nml Nml Nml Nml Nml Nml N/A  Left 1stDorInt Nml 1+ Nml 3- 1+ 1+ 1+ Nml N/A  Left Abd Poll Brev Nml 1+ Nml 3- 1+ 1+ 1+ Nml N/A  Left Ext Indicis Nml 1+ Nml 3- 1+ 1+ 1+ Nml N/A  Left PronatorTeres Nml Nml Nml Nml Nml Nml Nml Nml N/A      Waveforms:

## 2022-10-07 ENCOUNTER — Other Ambulatory Visit (HOSPITAL_COMMUNITY): Payer: Self-pay

## 2022-10-07 ENCOUNTER — Other Ambulatory Visit: Payer: Self-pay

## 2022-10-07 ENCOUNTER — Inpatient Hospital Stay: Payer: Medicare Other | Attending: Hematology and Oncology

## 2022-10-07 ENCOUNTER — Inpatient Hospital Stay (HOSPITAL_BASED_OUTPATIENT_CLINIC_OR_DEPARTMENT_OTHER): Payer: Medicare Other | Admitting: Hematology and Oncology

## 2022-10-07 ENCOUNTER — Encounter: Payer: Self-pay | Admitting: Hematology and Oncology

## 2022-10-07 VITALS — BP 147/75 | HR 94 | Temp 97.7°F | Resp 18 | Ht 62.0 in | Wt 181.0 lb

## 2022-10-07 DIAGNOSIS — G6181 Chronic inflammatory demyelinating polyneuritis: Secondary | ICD-10-CM

## 2022-10-07 DIAGNOSIS — I825Y9 Chronic embolism and thrombosis of unspecified deep veins of unspecified proximal lower extremity: Secondary | ICD-10-CM

## 2022-10-07 DIAGNOSIS — Z86718 Personal history of other venous thrombosis and embolism: Secondary | ICD-10-CM | POA: Insufficient documentation

## 2022-10-07 DIAGNOSIS — Z7901 Long term (current) use of anticoagulants: Secondary | ICD-10-CM | POA: Insufficient documentation

## 2022-10-07 DIAGNOSIS — N183 Chronic kidney disease, stage 3 unspecified: Secondary | ICD-10-CM | POA: Insufficient documentation

## 2022-10-07 LAB — CBC WITH DIFFERENTIAL/PLATELET
Abs Immature Granulocytes: 0.02 10*3/uL (ref 0.00–0.07)
Basophils Absolute: 0.1 10*3/uL (ref 0.0–0.1)
Basophils Relative: 1 %
Eosinophils Absolute: 0.2 10*3/uL (ref 0.0–0.5)
Eosinophils Relative: 2 %
HCT: 40.6 % (ref 36.0–46.0)
Hemoglobin: 13.5 g/dL (ref 12.0–15.0)
Immature Granulocytes: 0 %
Lymphocytes Relative: 29 %
Lymphs Abs: 2.1 10*3/uL (ref 0.7–4.0)
MCH: 31.2 pg (ref 26.0–34.0)
MCHC: 33.3 g/dL (ref 30.0–36.0)
MCV: 93.8 fL (ref 80.0–100.0)
Monocytes Absolute: 0.4 10*3/uL (ref 0.1–1.0)
Monocytes Relative: 6 %
Neutro Abs: 4.5 10*3/uL (ref 1.7–7.7)
Neutrophils Relative %: 62 %
Platelets: 334 10*3/uL (ref 150–400)
RBC: 4.33 MIL/uL (ref 3.87–5.11)
RDW: 12.8 % (ref 11.5–15.5)
WBC: 7.2 10*3/uL (ref 4.0–10.5)
nRBC: 0 % (ref 0.0–0.2)

## 2022-10-07 MED ORDER — RIVAROXABAN 20 MG PO TABS
20.0000 mg | ORAL_TABLET | Freq: Every day | ORAL | 11 refills | Status: DC
  Filled 2022-10-07: qty 30, fill #0
  Filled 2022-11-04 – 2022-11-17 (×2): qty 30, 30d supply, fill #0
  Filled 2023-01-02: qty 30, 30d supply, fill #1
  Filled 2023-02-09: qty 30, 30d supply, fill #2
  Filled 2023-03-19: qty 30, 30d supply, fill #3
  Filled 2023-04-13: qty 30, 30d supply, fill #4
  Filled 2023-05-25: qty 30, 30d supply, fill #5
  Filled 2023-07-02: qty 30, 30d supply, fill #6
  Filled 2023-08-03: qty 30, 30d supply, fill #7
  Filled 2023-09-01: qty 30, 30d supply, fill #8

## 2022-10-07 NOTE — Assessment & Plan Note (Signed)
She is doing with very well on long termXarelto. She has no complications from treatment. Goal of treatment is lifelong. I will see her once a year She is educated to watch for signs and symptoms of bleeding

## 2022-10-07 NOTE — Progress Notes (Signed)
[facility] OFFICE PROGRESS NOTE  [name], MD  ASSESSMENT & PLAN:  DVT (deep venous thrombosis) She is doing with very well on long termXarelto. She has no complications from treatment. Goal of treatment is lifelong. I will see her once a year She is educated to watch for signs and symptoms of bleeding  CKD (chronic kidney disease) stage 3, GFR 30-59 ml/min She has intermittent elevated serum creatinine Her recent creatinine clearance is adequate for her to remain on Xarelto I will continue to follow her once a year  No orders of the defined types were placed in this encounter.   The total time spent in the appointment was 20 minutes encounter with patients including review of chart and various tests results, discussions about plan of care and coordination of care plan   All questions were answered. The patient knows to call the clinic with any problems, questions or concerns. No barriers to learning was detected.    [name], MD 12/5/202310:33 AM  INTERVAL HISTORY: Tammy [name] 66 y.o. 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 According to the patient, she was first diagnosed with blood clot in 2008. According to the patient it was unprovoked. The patient complained of shortness of breath and cough and chest discomfort. She was smoking at that time. She had imaging study of the lung done which show evidence of PE. According to the patient she was on a blood thinner for 2 years. The date of the CT scan was 06/24/2007. In 2014, she started to complain of intermittent sharp discomfort on the right leg associated with some mild swelling. She states that she had pain on the left leg as well but is not as severe compared to the right leg. She went to the emergency department. Results of the ultrasound venous  Doppler came back consistent with acute deep vein thrombosis involving the posterior tibial and perioneal vein of the right lower extremity. D-dimer was elevated. She was placed on Lovenox and transition to warfarin 5 mg daily. Her INR has been supratherapeutic requiring vitamin K and multiple dose interruption to keep her INR at the desire range at 2-3. She denies any bleeding complications such as spontaneous epistaxis, hematuria, hematochezia, or the need for blood transfusions. She had mild intermittent chest discomfort throughout this and had imaging study done which show possible new clot in the left lung on the CT scan from September 2014. She has been pregnant 4 times including C-section for her pregnancy and never developed any peripartum blood clots. There were no family history of blood clots. In October 2014, I made a decision to change her to Xarelto.  Duration of treatment is lifelong  I have reviewed the past medical history, past surgical history, social history and family history with the patient and they are unchanged from previous note.  ALLERGIES:  has No Known Allergies.  MEDICATIONS:  Current Outpatient Medications  Medication Sig Dispense Refill   amLODipine (NORVASC) 5 MG tablet Take 1 tablet by mouth daily. 90 tablet 1   atorvastatin (LIPITOR) 20 MG tablet Take 1 tablet by mouth daily with a 40 mg tablet (Patient not taking: Reported on 09/03/2022) 90 tablet 1   gabapentin (NEURONTIN) 300 MG capsule Take 1 capsule by mouth in the morning and take 3 capsules at bedtime. 360 capsule 3   rivaroxaban (  XARELTO) 20 MG TABS tablet TAKE 1 TABLET BY MOUTH ONCE A DAY 30 tablet 11   Current Facility-Administered Medications  Medication Dose Route Frequency Provider Last Rate Last Admin   methylPREDNISolone sodium succinate (SOLU-MEDROL) 1,000 mg in sodium chloride 0.9 % 50 mL IVPB  1,000 mg Intravenous Q28 days Alda Berthold, DO 58 mL/hr at 01/27/18 0928 1,000 mg at 01/27/18 8341      REVIEW OF SYSTEMS:   Constitutional: Denies fevers, chills or night sweats Eyes: Denies blurriness of vision Ears, nose, mouth, throat, and face: Denies mucositis or sore throat Respiratory: Denies cough, dyspnea or wheezes Cardiovascular: Denies palpitation, chest discomfort or lower extremity swelling Gastrointestinal:  Denies nausea, heartburn or change in bowel habits Skin: Denies abnormal skin rashes Lymphatics: Denies new lymphadenopathy or easy bruising Neurological:Denies numbness, tingling or new weaknesses Behavioral/Psych: Mood is stable, no new changes  All other systems were reviewed with the patient and are negative.  PHYSICAL EXAMINATION: ECOG PERFORMANCE STATUS: 0 - Asymptomatic  Vitals:   10/07/22 0937  BP: (!) 147/75  Pulse: 94  Resp: 18  Temp: 97.7 F (36.5 C)  SpO2: 100%   Filed Weights   10/07/22 0937  Weight: 181 lb (82.1 kg)    GENERAL:alert, no distress and comfortable  NEURO: alert & oriented x 3 with fluent speech, no focal motor/sensory deficits  LABORATORY DATA:  I have reviewed the data as listed     Component Value Date/Time   NA 143 05/29/2022 0925   NA 141 08/11/2013 1046   K 4.7 05/29/2022 0925   K 4.3 08/11/2013 1046   CL 106 05/29/2022 0925   CO2 23 05/29/2022 0925   CO2 27 08/11/2013 1046   GLUCOSE 114 (H) 05/29/2022 0925   GLUCOSE 125 (H) 10/07/2021 0920   GLUCOSE 102 08/11/2013 1046   BUN 10 05/29/2022 0925   BUN 10.5 08/11/2013 1046   CREATININE 1.13 (H) 05/29/2022 0925   CREATININE 1.11 (H) 10/07/2021 0920   CREATININE 0.99 10/22/2015 1159   CREATININE 1.0 08/11/2013 1046   CALCIUM 9.4 05/29/2022 0925   CALCIUM 9.5 08/11/2013 1046   PROT 7.3 05/29/2022 0925   PROT 7.3 08/11/2013 1046   ALBUMIN 4.3 05/29/2022 0925   ALBUMIN 4.1 11/10/2017 1444   ALBUMIN 3.4 (L) 08/11/2013 1046   AST 17 05/29/2022 0925   AST 17 10/06/2019 0932   AST 15 08/11/2013 1046   ALT 12 05/29/2022 0925   ALT 14 10/06/2019 0932   ALT 13  08/11/2013 1046   ALKPHOS 120 05/29/2022 0925   ALKPHOS 112 08/11/2013 1046   BILITOT 0.6 05/29/2022 0925   BILITOT 0.7 10/06/2019 0932   BILITOT 0.73 08/11/2013 1046   GFRNONAA 56 (L) 10/07/2021 0920   GFRNONAA 63 10/22/2015 1159   GFRAA 65 07/04/2020 0958   GFRAA 53 (L) 10/06/2019 0932   GFRAA 73 10/22/2015 1159    No results found for: "SPEP", "UPEP"  Lab Results  Component Value Date   WBC 7.2 10/07/2022   NEUTROABS 4.5 10/07/2022   HGB 13.5 10/07/2022   HCT 40.6 10/07/2022   MCV 93.8 10/07/2022   PLT 334 10/07/2022      Chemistry      Component Value Date/Time   NA 143 05/29/2022 0925   NA 141 08/11/2013 1046   K 4.7 05/29/2022 0925   K 4.3 08/11/2013 1046   CL 106 05/29/2022 0925   CO2 23 05/29/2022 0925   CO2 27 08/11/2013 1046   BUN 10 05/29/2022 0925  BUN 10.5 08/11/2013 1046   CREATININE 1.13 (H) 05/29/2022 0925   CREATININE 1.11 (H) 10/07/2021 0920   CREATININE 0.99 10/22/2015 1159   CREATININE 1.0 08/11/2013 1046      Component Value Date/Time   CALCIUM 9.4 05/29/2022 0925   CALCIUM 9.5 08/11/2013 1046   ALKPHOS 120 05/29/2022 0925   ALKPHOS 112 08/11/2013 1046   AST 17 05/29/2022 0925   AST 17 10/06/2019 0932   AST 15 08/11/2013 1046   ALT 12 05/29/2022 0925   ALT 14 10/06/2019 0932   ALT 13 08/11/2013 1046   BILITOT 0.6 05/29/2022 0925   BILITOT 0.7 10/06/2019 0932   BILITOT 0.73 08/11/2013 1046

## 2022-10-07 NOTE — Assessment & Plan Note (Signed)
She has intermittent elevated serum creatinine Her recent creatinine clearance is adequate for her to remain on Xarelto I will continue to follow her once a year

## 2022-10-17 ENCOUNTER — Ambulatory Visit: Payer: Medicaid Other | Admitting: Neurology

## 2022-10-29 ENCOUNTER — Other Ambulatory Visit: Payer: Self-pay | Admitting: Internal Medicine

## 2022-10-29 NOTE — Telephone Encounter (Signed)
Requested medication (s) are due for refill today: routing for approval  Requested medication (s) are on the active medication list: yes  Last refill:  10/07/22  Future visit scheduled: yes  Notes to clinic:  Unable to refill per protocol, last refill by another provider.      Requested Prescriptions  Pending Prescriptions Disp Refills   rivaroxaban (XARELTO) 20 MG TABS tablet 30 tablet 11    Sig: TAKE 1 TABLET BY MOUTH ONCE A DAY     Hematology: Anticoagulants - rivaroxaban Failed - 10/29/2022  1:29 PM      Failed - Cr in normal range and within 360 days    Creatinine  Date Value Ref Range Status  10/07/2021 1.11 (H) 0.44 - 1.00 mg/dL Final  08/11/2013 1.0 0.6 - 1.1 mg/dL Final   Creat  Date Value Ref Range Status  10/22/2015 0.99 0.50 - 1.05 mg/dL Final   Creatinine, Ser  Date Value Ref Range Status  05/29/2022 1.13 (H) 0.57 - 1.00 mg/dL Final   Creatinine,U  Date Value Ref Range Status  06/26/2009 164.2 mg/dL Final    Comment:    See lab report for associated comment(s)         Passed - ALT in normal range and within 360 days    ALT  Date Value Ref Range Status  05/29/2022 12 0 - 32 IU/L Final  10/06/2019 14 0 - 44 U/L Final  08/11/2013 13 0 - 55 U/L Final         Passed - AST in normal range and within 360 days    AST  Date Value Ref Range Status  05/29/2022 17 0 - 40 IU/L Final  10/06/2019 17 15 - 41 U/L Final  08/11/2013 15 5 - 34 U/L Final         Passed - HCT in normal range and within 360 days    HCT  Date Value Ref Range Status  10/07/2022 40.6 36.0 - 46.0 % Final  08/11/2013 37.0 34.8 - 46.6 % Final   Hematocrit  Date Value Ref Range Status  05/29/2022 39.3 34.0 - 46.6 % Final         Passed - HGB in normal range and within 360 days    Hemoglobin  Date Value Ref Range Status  10/07/2022 13.5 12.0 - 15.0 g/dL Final  05/29/2022 12.6 11.1 - 15.9 g/dL Final   HGB  Date Value Ref Range Status  08/11/2013 12.3 11.6 - 15.9 g/dL Final          Passed - PLT in normal range and within 360 days    Platelets  Date Value Ref Range Status  10/07/2022 334 150 - 400 K/uL Final  05/29/2022 307 150 - 450 x10E3/uL Final         Passed - eGFR is 15 or above and within 360 days    GFR, Est African American  Date Value Ref Range Status  10/22/2015 73 >=60 mL/min Final   GFR, Est AFR Am  Date Value Ref Range Status  10/06/2019 53 (L) >60 mL/min Final   GFR calc Af Amer  Date Value Ref Range Status  07/04/2020 65 >59 mL/min/1.73 Final    Comment:    **[facility] currently reports eGFR in compliance with the current**   recommendations of the [employer]. [facility] will   update reporting as new guidelines are published from the NKF-ASN   Task force.    GFR, Est Non African American  Date Value  Ref Range Status  10/22/2015 63 >=60 mL/min Final    Comment:      The estimated GFR is a calculation valid for adults (>=78 years old) that uses the CKD-EPI algorithm to adjust for age and sex. It is   not to be used for children, pregnant women, hospitalized patients,    patients on dialysis, or with rapidly changing kidney function. According to the NKDEP, eGFR >89 is normal, 60-89 shows mild impairment, 30-59 shows moderate impairment, 15-29 shows severe impairment and <15 is ESRD.      GFR, Estimated  Date Value Ref Range Status  10/07/2021 56 (L) >60 mL/min Final    Comment:    (NOTE) Calculated using the CKD-EPI Creatinine Equation (2021)    GFR  Date Value Ref Range Status  10/29/2017 65.84 >60.00 mL/min Final   eGFR  Date Value Ref Range Status  05/29/2022 54 (L) >59 mL/min/1.73 Final         Passed - Patient is not pregnant      Passed - Valid encounter within last 12 months    Recent Outpatient Visits           5 months ago Essential hypertension   [place], Deborah B, MD   9 months ago Essential hypertension   [place], Deborah B, MD   1 year ago Essential hypertension   [place], Sylvia, RPH-CPP   1 year ago Essential hypertension   [place], Jarome Matin, RPH-CPP   1 year ago Establishing care with new doctor, encounter for   [place], MD       Future Appointments             In 2 months Wynetta Emery Dalbert Batman, MD [place]

## 2022-10-29 NOTE — Telephone Encounter (Signed)
Medication Refill - Medication: rivaroxaban (XARELTO) 20 MG TABS tablet    Has the patient contacted their pharmacy? Yes.    (Agent: If yes, when and what did the pharmacy advise?) Patient was advised by [facility]  to contact PCP and have PCP fill medication. Patient has 2 pills left. Informed patient she was due for an appointment on 09/29/2022 and she stated the nurse normally calls her to schedule. Patient has been placed on wait list but would like a short supply to hold her over.   Preferred Pharmacy (with phone number or street name):  [facility] Phone: [phone]  Fax: [phone]      Has the patient been seen for an appointment in the last year OR does the patient have an upcoming appointment? Yes.    Agent: Please be advised that RX refills may take up to 3 business days. We ask that you follow-up with your pharmacy.

## 2022-11-04 ENCOUNTER — Other Ambulatory Visit (HOSPITAL_COMMUNITY): Payer: Self-pay

## 2022-11-11 ENCOUNTER — Other Ambulatory Visit (HOSPITAL_COMMUNITY): Payer: Self-pay

## 2022-11-17 ENCOUNTER — Other Ambulatory Visit (HOSPITAL_COMMUNITY): Payer: Self-pay

## 2022-11-17 ENCOUNTER — Other Ambulatory Visit: Payer: Self-pay

## 2022-12-30 ENCOUNTER — Telehealth: Payer: Self-pay

## 2022-12-30 NOTE — Telephone Encounter (Signed)
[employer] [facility] is calling in, a PA is needing to be completed for the medication GAMMAGARD IV Infusion.

## 2022-12-30 NOTE — Telephone Encounter (Signed)
Email has been sent to [place] at [place] to check on Prior Authorization.

## 2022-12-30 NOTE — Telephone Encounter (Signed)
Response from [place] at [employer]: "[facility], I hope you are doing great. Our PA team should be taking care of that for her.  I will reach out to see where we are and if a Cover My Meds code is needed to complete her PA.  I will report back as soon as I know more. Thanks so much!"

## 2023-01-02 ENCOUNTER — Other Ambulatory Visit (HOSPITAL_COMMUNITY): Payer: Self-pay

## 2023-01-02 ENCOUNTER — Other Ambulatory Visit: Payer: Self-pay | Admitting: Internal Medicine

## 2023-01-02 DIAGNOSIS — I10 Essential (primary) hypertension: Secondary | ICD-10-CM

## 2023-01-02 MED ORDER — AMLODIPINE BESYLATE 5 MG PO TABS
5.0000 mg | ORAL_TABLET | Freq: Every day | ORAL | 0 refills | Status: DC
  Filled 2023-01-02: qty 30, 30d supply, fill #0

## 2023-01-02 NOTE — Telephone Encounter (Addendum)
[employer] called they need additional clinical information.

## 2023-01-02 NOTE — Telephone Encounter (Signed)
Pending response from [place]. Contacted her through [employer]

## 2023-01-05 ENCOUNTER — Encounter: Payer: Self-pay | Admitting: Internal Medicine

## 2023-01-05 ENCOUNTER — Other Ambulatory Visit (HOSPITAL_COMMUNITY): Payer: Self-pay

## 2023-01-05 ENCOUNTER — Ambulatory Visit: Payer: 59 | Attending: Internal Medicine | Admitting: Internal Medicine

## 2023-01-05 ENCOUNTER — Telehealth: Payer: Self-pay | Admitting: Neurology

## 2023-01-05 VITALS — BP 117/72 | HR 91 | Temp 98.6°F | Ht 62.0 in | Wt 183.0 lb

## 2023-01-05 DIAGNOSIS — G6181 Chronic inflammatory demyelinating polyneuritis: Secondary | ICD-10-CM

## 2023-01-05 DIAGNOSIS — I10 Essential (primary) hypertension: Secondary | ICD-10-CM | POA: Diagnosis not present

## 2023-01-05 DIAGNOSIS — N183 Chronic kidney disease, stage 3 unspecified: Secondary | ICD-10-CM

## 2023-01-05 DIAGNOSIS — D6859 Other primary thrombophilia: Secondary | ICD-10-CM | POA: Diagnosis not present

## 2023-01-05 DIAGNOSIS — E669 Obesity, unspecified: Secondary | ICD-10-CM

## 2023-01-05 DIAGNOSIS — E782 Mixed hyperlipidemia: Secondary | ICD-10-CM

## 2023-01-05 DIAGNOSIS — Z86711 Personal history of pulmonary embolism: Secondary | ICD-10-CM | POA: Diagnosis not present

## 2023-01-05 MED ORDER — ATORVASTATIN CALCIUM 20 MG PO TABS
ORAL_TABLET | ORAL | 1 refills | Status: DC
  Filled 2023-01-05: qty 90, fill #0
  Filled 2023-04-13: qty 90, 90d supply, fill #0

## 2023-01-05 MED ORDER — AMLODIPINE BESYLATE 5 MG PO TABS
5.0000 mg | ORAL_TABLET | Freq: Every day | ORAL | 1 refills | Status: DC
  Filled 2023-01-05 – 2023-02-09 (×2): qty 90, 90d supply, fill #0
  Filled 2023-05-25: qty 90, 90d supply, fill #1

## 2023-01-05 NOTE — Telephone Encounter (Signed)
I have [place] at [place] to check on this ASAP

## 2023-01-05 NOTE — Telephone Encounter (Signed)
Santiago Glad from [facility] called in and left a message with the access nurse on 01/03/23. The prior authorization department needs additional information. Ref # [id number]. Deadline for PA is 01/05/23 at 7:40 AM

## 2023-01-05 NOTE — Progress Notes (Signed)
Patient ID: Tammy [name], female    DOB: 13-Mar-1956  MRN: [id number]  CC: Hypertension (Htn f/u. Med refills. /No to flu vax. Yes to pap for next appt. No to pneumonia vax.)   Subjective: Tammy [name] is a 67 y.o. 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 CKD 3   HTN: Reports compliance with Norvasc 5 mg daily and with salt restriction.    Checks BP about once a wk and it has been good. No CP/SOB/occasional LE edema Pt does not smoke Reports compliance with taking atorvastatin.  Tolerating the medication without muscle cramps/muscle aches.  History of DVT/PE: Taking and tolerating Xarelto. No bruising or bleeding. Last CBC 10/2022 and was nl.  CKD 3:  GFR stable in 50s.  Last was 85 with creat 1.13  CIPD:  Continues to be followed by Dr. Posey Pronto and receives IVIG every 4 weeks.  Feels her symptoms are stable.  Has appt coming up.  Obesity:  wgh has been slowly creeping up.  Stress eater - wakes up at nights snacking on jelly beans, cookies, ice cream  HM:  declines PCV, COVID booster.  Due for pap.  Declines bone density study.  Declines MMG order at this time.  Last MMG was 09/2021.  Patient Active Problem List   Diagnosis Date Noted   Thrombophilia ([place]) 01/27/2022   Class 2 severe obesity due to excess calories with serious comorbidity and body mass index (BMI) of 35.0 to 35.9 in adult ([place]) 01/27/2022   Preventive measure 10/05/2020   CKD (chronic kidney disease) stage 3, GFR 30-59 ml/min ([place]) 10/06/2019   Essential hypertension 03/14/2019   Hyperlipidemia 03/14/2019   Precordial chest pain 03/14/2019   Former tobacco use 02/08/2019   CIDP (chronic inflammatory demyelinating polyneuropathy) ([place]) 01/12/2018   Positive ANA (antinuclear antibody) 12/24/2015   Generalized anxiety disorder 12/24/2015    Dyslipidemia 10/09/2014   Cervical spondylolysis 10/09/2014   DVT (deep venous thrombosis) ([place]) 08/22/2013   LGSIL (low grade squamous intraepithelial lesion) on Pap smear 08/04/2013   Sleep apnea 05/21/2011     Current Outpatient Medications on File Prior to Visit  Medication Sig Dispense Refill   amLODipine (NORVASC) 5 MG tablet Take 1 tablet (5 mg total) by mouth daily. Must have office visit for refills 30 tablet 0   atorvastatin (LIPITOR) 20 MG tablet Take 1 tablet by mouth daily with a 40 mg tablet 90 tablet 1   gabapentin (NEURONTIN) 300 MG capsule Take 1 capsule by mouth in the morning and take 3 capsules at bedtime. 360 capsule 3   rivaroxaban (XARELTO) 20 MG TABS tablet Take 1 tablet (20 mg total) by mouth daily. 30 tablet 11   Current Facility-Administered Medications on File Prior to Visit  Medication Dose Route Frequency Provider Last Rate Last Admin   methylPREDNISolone sodium succinate (SOLU-MEDROL) 1,000 mg in sodium chloride 0.9 % 50 mL IVPB  1,000 mg Intravenous Q28 days Alda Berthold, DO 58 mL/hr at 01/27/18 0928 1,000 mg at 01/27/18 [id number]    No Known Allergies  Social History   Socioeconomic History   Marital status: Single    Spouse name: Not on file   Number of children: 4   Years of education: 12   Highest education level: Not on file  Occupational History   Occupation: unemployed  Tobacco Use  Smoking status: Light Smoker    Packs/day: 0.00    Years: 40.00    Total pack years: 0.00    Types: Cigarettes   Smokeless tobacco: Never   Tobacco comments:    Patient reports smoking 2-3 cigarettes a day   Vaping Use   Vaping Use: Never used  Substance and Sexual Activity   Alcohol use: No   Drug use: No   Sexual activity: Yes  Other Topics Concern   Not on file  Social History Narrative   Lives alone in a one story home.  Has 4 children.  Works as a Education officer, environmental at [employer].  Education: GED   Recent travel to [place] 01/2011   Right  handed   Social Determinants of Health   Financial Resource Strain: Not on file  Food Insecurity: Not on file  Transportation Needs: Not on file  Physical Activity: Sufficiently Active (10/01/2017)   Exercise Vital Sign    Days of Exercise per Week: 7 days    Minutes of Exercise per Session: 110 min  Stress: Stress Concern Present (10/01/2017)   [place]    Feeling of Stress : Very much  Social Connections: Moderately Isolated (10/01/2017)   Social Connection and Isolation Panel [NHANES]    Frequency of Communication with Friends and Family: More than three times a week    Frequency of Social Gatherings with Friends and Family: More than three times a week    Attends Religious Services: Never    Marine scientist or Organizations: No    Attends Archivist Meetings: Never    Marital Status: Divorced  Human resources officer Violence: Not At Risk (10/01/2017)   Humiliation, Afraid, Rape, and Kick questionnaire    Fear of Current or Ex-Partner: No    Emotionally Abused: No    Physically Abused: No    Sexually Abused: No    Family History  Problem Relation Age of Onset   Emphysema Father    Breast cancer Mother    Breast cancer Maternal Grandmother    Heart disease Sister    Cancer Sister    Diabetes Brother     Past Surgical History:  Procedure Laterality Date   CESAREAN SECTION     one previous   right arm fracture     TUBAL LIGATION      ROS: Review of Systems Negative except as stated above  PHYSICAL EXAM: BP 117/72 (BP Location: Left Arm, Patient Position: Sitting, Cuff Size: Large)   Pulse 91   Temp 98.6 F (37 C) (Oral)   Ht '5\' 2"'$  (1.575 m)   Wt 183 lb (83 kg)   SpO2 98%   BMI 33.47 kg/m   Wt Readings from Last 3 Encounters:  01/05/23 183 lb (83 kg)  10/07/22 181 lb (82.1 kg)  09/03/22 177 lb (80.3 kg)    Physical Exam  General appearance - alert, well appearing, older  African-American female and in no distress Mental status - normal mood, behavior, speech, dress, motor activity, and thought processes Neck - supple, no significant adenopathy Chest - clear to auscultation, no wheezes, rales or rhonchi, symmetric air entry Heart - normal rate, regular rhythm, normal S1, S2, no murmurs, rubs, clicks or gallops Extremities - peripheral pulses normal, no pedal edema, no clubbing or cyanosis      Latest Ref Rng & Units 05/29/2022    9:25 AM 10/07/2021    9:20 AM 06/20/2021  3:39 PM  CMP  Glucose 70 - 99 mg/dL 114  125  86   BUN 8 - 27 mg/dL '10  9  10   '$ Creatinine 0.57 - 1.00 mg/dL 1.13  1.11  1.13   Sodium 134 - 144 mmol/L 143  138  139   Potassium 3.5 - 5.2 mmol/L 4.7  3.9  5.1   Chloride 96 - 106 mmol/L 106  105  101   CO2 20 - 29 mmol/L '23  24  24   '$ Calcium 8.7 - 10.3 mg/dL 9.4  9.2  9.9   Total Protein 6.0 - 8.5 g/dL 7.3   8.5   Total Bilirubin 0.0 - 1.2 mg/dL 0.6   0.6   Alkaline Phos 44 - 121 IU/L 120   124   AST 0 - 40 IU/L 17   21   ALT 0 - 32 IU/L 12   15    Lipid Panel     Component Value Date/Time   CHOL 181 05/29/2022 0925   TRIG 211 (H) 05/29/2022 0925   HDL 36 (L) 05/29/2022 0925   CHOLHDL 5.0 (H) 05/29/2022 0925   CHOLHDL 7.2 (H) 12/24/2015 1109   VLDL 74 (H) 12/24/2015 1109   LDLCALC 108 (H) 05/29/2022 0925    CBC    Component Value Date/Time   WBC 7.2 10/07/2022 0923   RBC 4.33 10/07/2022 0923   HGB 13.5 10/07/2022 0923   HGB 12.6 05/29/2022 0925   HGB 12.3 08/11/2013 1046   HCT 40.6 10/07/2022 0923   HCT 39.3 05/29/2022 0925   HCT 37.0 08/11/2013 1046   PLT 334 10/07/2022 0923   PLT 307 05/29/2022 0925   MCV 93.8 10/07/2022 0923   MCV 92 05/29/2022 0925   MCV 91.2 08/11/2013 1046   MCH 31.2 10/07/2022 0923   MCHC 33.3 10/07/2022 0923   RDW 12.8 10/07/2022 0923   RDW 12.5 05/29/2022 0925   RDW 13.6 08/11/2013 1046   LYMPHSABS 2.1 10/07/2022 0923   LYMPHSABS 2.4 05/13/2017 0956   LYMPHSABS 1.7 08/11/2013 1046    MONOABS 0.4 10/07/2022 0923   MONOABS 0.6 08/11/2013 1046   EOSABS 0.2 10/07/2022 0923   EOSABS 0.2 05/13/2017 0956   BASOSABS 0.1 10/07/2022 0923   BASOSABS 0.0 05/13/2017 0956   BASOSABS 0.1 08/11/2013 1046    ASSESSMENT AND PLAN:  1. Essential hypertension At goal.  Continue Norvasc 5 mg daily and low-salt diet - amLODipine (NORVASC) 5 MG tablet; Take 1 tablet (5 mg total) by mouth daily. Must have office visit for refills  Dispense: 90 tablet; Refill: 1  2. Mixed hyperlipidemia - atorvastatin (LIPITOR) 20 MG tablet; Take 1 tablet by mouth daily with a 40 mg tablet  Dispense: 90 tablet; Refill: 1  3. CIDP (chronic inflammatory demyelinating polyneuropathy) ([place]) Followed by and being managed her neurologist Dr. Posey Pronto.  4. Thrombophilia ([place]) 5. History of pulmonary embolism -Tolerating Xarelto without problems.  Had seen Dr. Alvy Bimler 10/2022.  6. Stage 3 chronic kidney disease, unspecified whether stage 3a or 3b CKD ([place]) Stable.  Continue to follow. - Basic Metabolic Panel  7. Obesity (BMI 30.0-34.9) Patient advised to eliminate sugary drinks from the diet, cut back on portion sizes especially of white carbohydrates, eat more white lean meat like chicken [place] and seafood instead of beef or pork and incorporate fresh fruits and vegetables into the diet daily. Encouraged her to get in some form of moderate intensity exercise at least 5 days a week for 30 minutes.  Patient was given the opportunity to ask questions.  Patient verbalized understanding of the plan and was able to repeat key elements of the plan.   This documentation was completed using Radio producer.  Any transcriptional errors are unintentional.  No orders of the defined types were placed in this encounter.    Requested Prescriptions    No prescriptions requested or ordered in this encounter    No follow-ups on file.  Karle Plumber, MD, FACP

## 2023-01-06 ENCOUNTER — Ambulatory Visit: Payer: 59 | Admitting: Neurology

## 2023-01-06 ENCOUNTER — Other Ambulatory Visit (HOSPITAL_COMMUNITY): Payer: Self-pay

## 2023-01-06 ENCOUNTER — Other Ambulatory Visit: Payer: Self-pay

## 2023-01-06 LAB — BASIC METABOLIC PANEL
BUN/Creatinine Ratio: 11 — ABNORMAL LOW (ref 12–28)
BUN: 11 mg/dL (ref 8–27)
CO2: 23 mmol/L (ref 20–29)
Calcium: 9.8 mg/dL (ref 8.7–10.3)
Chloride: 104 mmol/L (ref 96–106)
Creatinine, Ser: 0.96 mg/dL (ref 0.57–1.00)
Glucose: 86 mg/dL (ref 70–99)
Potassium: 4.4 mmol/L (ref 3.5–5.2)
Sodium: 142 mmol/L (ref 134–144)
eGFR: 66 mL/min/{1.73_m2} (ref >59)

## 2023-01-14 ENCOUNTER — Encounter: Payer: Self-pay | Admitting: Neurology

## 2023-01-26 DIAGNOSIS — G6181 Chronic inflammatory demyelinating polyneuritis: Secondary | ICD-10-CM | POA: Diagnosis not present

## 2023-01-27 DIAGNOSIS — G6181 Chronic inflammatory demyelinating polyneuritis: Secondary | ICD-10-CM | POA: Diagnosis not present

## 2023-01-28 DIAGNOSIS — G6181 Chronic inflammatory demyelinating polyneuritis: Secondary | ICD-10-CM | POA: Diagnosis not present

## 2023-02-05 ENCOUNTER — Telehealth: Payer: Self-pay | Admitting: Internal Medicine

## 2023-02-05 NOTE — Telephone Encounter (Signed)
[facility] to schedule their annual wellness visit. Welcome to Medicare visit Due by 10/04/23.  [facility] [employer] AWV direct phone # [phone]   WTM before 10/04/23 per [facility]

## 2023-02-09 ENCOUNTER — Ambulatory Visit (INDEPENDENT_AMBULATORY_CARE_PROVIDER_SITE_OTHER): Payer: 59 | Admitting: Neurology

## 2023-02-09 ENCOUNTER — Encounter: Payer: Self-pay | Admitting: Neurology

## 2023-02-09 ENCOUNTER — Other Ambulatory Visit: Payer: Self-pay

## 2023-02-09 ENCOUNTER — Other Ambulatory Visit (HOSPITAL_COMMUNITY): Payer: Self-pay

## 2023-02-09 VITALS — BP 158/87 | HR 111 | Ht 62.0 in | Wt 185.0 lb

## 2023-02-09 DIAGNOSIS — G6181 Chronic inflammatory demyelinating polyneuritis: Secondary | ICD-10-CM

## 2023-02-09 MED ORDER — DULOXETINE HCL 30 MG PO CPEP
30.0000 mg | ORAL_CAPSULE | Freq: Every day | ORAL | 3 refills | Status: DC
  Filled 2023-02-09: qty 90, 90d supply, fill #0
  Filled 2023-05-25: qty 90, 90d supply, fill #1
  Filled 2023-09-01: qty 90, 90d supply, fill #2

## 2023-02-09 NOTE — Progress Notes (Signed)
Follow-up Visit   Date: 02/09/23    Tammy [name] MRN: [id number] DOB: [birth year]   Interim History: Tammy [name] is a 67 y.o. 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  The patient was accompanied to the clinic by self.  IMPRESSION/PLAN: Chronic inflammatory demyelinating polyradiculoneuropathy (diagnosed 20218) (diagnosed 2018) presenting with paresthesia and weakness in the upper extremities, supported by EMG and CSF.  IVIG started in 07/2018 every 21 days, which has stabilized symptoms.  Repeat EMG from 2023 shows progressive neuropathy, so I recommend that she continue IVIG every 4 weeks.  Unfortunately, due to insurance approval she has been off therapy for > 2 months and reports worsening hand parethesias and pain.    - She will be restarted IVIG 1g/kg later this month which will be continued every 4 weeks  - Continue gabapentin 600mg  twice daily (unable to titrate due to sedation)  - Start Cymbalta 30mg  daily  Return to clinic in 6 months   --------------------------------------------------------------------------- History of present illness: Starting around 2018, she began having numbness and tingling of the hands. She has some weakness of her grip, such as holding a phone or opening jars.  Symptoms are worse at night time and often wake her up from sleeping.  She denies any chronic neck pain.  She takes gabapentin 300mg  three times daily and nortriptyline 25mg  , which does not provide any relief.   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 Because of clinical suspicion for CIDP, she had induction dose of IVMP and has noticed some improvement in her burning sensation.  NCS/EMG of the legs was normal.   Labs also indicated thiamine deficiency and she was started on supplementation.  She was switched to home IVIG in September 2019 due to lack of benefit on steroids.  She has noticed mild improvement only.  Repeat NCS/EMG in 2020 shows progressive neuropathy.  UPDATE 09/03/2022:  She is here for follow-up visit.  She continues to have burning and tingling of the hands, which is worse at night time.  She wld like to increase the doseof night time gabapentin.  She currently takes gabapentin 300mg  in the AM and 600mg  at bedtime.  No new weakness, imbalance or falls.  She continues to receive IVIG every 28 days and she feels that it helps.     UPDATE 02/09/2023:  She is here for follow-up visit.  She was off IVIG for 2 months due to insurance approval.  Since being off therapy, she feels that her hands feel like steel wool. She is taking gabapentin 600mg  twice daily which helps control her pain.  She has difficulty with fine motor tasks and unable to perform hobbies like sewing or crochet. She denies falls or imbalance. She was finally approved and will get restarting IVIG later this month.   Medications:  Current Outpatient Medications on File Prior to Visit  Medication Sig Dispense Refill   amLODipine (NORVASC) 5 MG tablet Take 1 tablet (5 mg total) by mouth daily. Must have office visit for refills 90 tablet 1   atorvastatin (LIPITOR) 20 MG tablet Take 1 tablet by mouth daily with a 40 mg tablet 90 tablet 1   gabapentin (NEURONTIN) 300 MG capsule Take 1 capsule by mouth in the morning and take 3 capsules  at bedtime. 360 capsule 3   rivaroxaban (XARELTO) 20 MG TABS tablet Take 1 tablet (20 mg total) by mouth daily. 30 tablet 11   Current Facility-Administered Medications on File Prior to Visit  Medication Dose Route Frequency Provider Last Rate Last Admin   methylPREDNISolone sodium succinate (SOLU-MEDROL) 1,000 mg in sodium chloride 0.9 % 50 mL IVPB  1,000 mg Intravenous Q28 days Glendale Chard, DO 58  mL/hr at 01/27/18 0928 1,000 mg at 01/27/18 9163    Allergies: No Known Allergies  Vital Signs:  BP (!) 158/87   Pulse (!) 111   Ht 5\' 2"  (1.575 m)   Wt 185 lb (83.9 kg)   SpO2 99%   BMI 33.84 kg/m   Neurological Exam: MENTAL STATUS including orientation to time, place, person, recent and remote memory, attention span and concentration, language, and fund of knowledge is normal.  Speech is not dysarthric.  CRANIAL NERVES:  Pupils equal round and reactive to light.  Normal conjugate, extra-ocular eye movements in all directions of gaze.  No ptosis.   Face is symmetric.   MOTOR:  Motor strength is 5/5 in all extremities, except intrinsic hand muscles 5-/5.  No atrophy, fasciculations or abnormal movements.  No pronator drift.  Tone is normal.    MSRs:  Reflexes are 2+/4 throughout  SENSORY:  Reduced sensation to temperature over the palmer surface bilaterally. Vibration is intact throughout  COORDINATION/GAIT:   Gait narrow based and stable.    Data:  NCS/EMG of the upper extremities 10/02/2022: The electrophysiologic findings continue to show a severe active on chronic polyradiculoneuropathy affecting the upper extremities, with mild progression compared to prior study on 10/18/2019.  NCS/EMG of the arms 10/15/2017:  The electrophysiologic findings are most consistent with a subacute sensorimotor polyneuropathy, axon loss and demyelinating in type, affecting the upper extremities; these findings are moderate in degree electrically.  NCS/EMG of the legs 10/29/2017:  Normal  CSF 11/10/2017:  R1 W1 G75 P85*    IgG 0.64, MBP < 2, OCB + CSF and serum, cytology negative, ACE 4  Labs 10/29/2017:  ESR 72*, CRP <0.3, vitamin B12 434, vitamin B1 7*, copper 153, folate 15.6, MMA 122, ANA 1:80 (speckled), SPEP with IFE no M protein, ACE 77*, heavy metal screen  Labs 01/19/2019:  Vitamin B1 8  MRI cervical spine 01/20/2018: 1. Stable mild right foraminal narrowing at C6-7. 2. Slight  progression of uncovertebral disease and mild right foraminal narrowing at C5-6. 3. No focal cord signal abnormality or thickening of nerve roots.      Thank you for allowing me to participate in patient's care.  If I can answer any additional questions, I would be pleased to do so.    Sincerely,    Aireal Slater K. Allena Katz, DO

## 2023-02-09 NOTE — Patient Instructions (Signed)
Start Cymbalta 30mg  daily  I will see you back in 6 months

## 2023-02-18 ENCOUNTER — Ambulatory Visit (HOSPITAL_BASED_OUTPATIENT_CLINIC_OR_DEPARTMENT_OTHER): Payer: 59 | Admitting: Physician Assistant

## 2023-02-18 ENCOUNTER — Other Ambulatory Visit (HOSPITAL_COMMUNITY)
Admission: RE | Admit: 2023-02-18 | Discharge: 2023-02-18 | Disposition: A | Discharge status: Home / Self Care | Payer: 59 | Source: Ambulatory Visit | Attending: Physician Assistant | Admitting: Physician Assistant

## 2023-02-18 VITALS — BP 150/77 | HR 89 | Wt 183.6 lb

## 2023-02-18 DIAGNOSIS — Z01419 Encounter for gynecological examination (general) (routine) without abnormal findings: Secondary | ICD-10-CM | POA: Insufficient documentation

## 2023-02-18 DIAGNOSIS — Z124 Encounter for screening for malignant neoplasm of cervix: Secondary | ICD-10-CM | POA: Insufficient documentation

## 2023-02-18 DIAGNOSIS — Z1151 Encounter for screening for human papillomavirus (HPV): Secondary | ICD-10-CM | POA: Insufficient documentation

## 2023-02-18 DIAGNOSIS — Z780 Asymptomatic menopausal state: Secondary | ICD-10-CM | POA: Insufficient documentation

## 2023-02-18 NOTE — Patient Instructions (Signed)
Pap Test Why am I having this test? A Pap test, also called a Pap smear, is a screening test to check for signs of: Infection. Cancer of the cervix. The cervix is the lower part of the uterus that opens into the vagina. Changes that may be a sign that cancer is developing (precancerous changes). Women need this test on a regular basis. In general, you should have a Pap test every 3 years until you reach menopause or age 65. Women aged 30-60 may choose to have their Pap test done at the same time as an HPV (human papillomavirus) test every 5 years (instead of every 3 years). Your health care provider may recommend having Pap tests more or less often depending on your medical conditions and past Pap test results. What is being tested? Cervical cells are tested for signs of infection or abnormalities. What kind of sample is taken?  Your health care provider will collect a sample of cells from the surface of your cervix. This will be done using a small cotton swab, plastic spatula, or brush that is inserted into your vagina using a tool called a speculum. This sample is often collected during a pelvic exam, when you are lying on your back on an exam table with your feet in footrests (stirrups). In some cases, fluids (secretions) from the cervix or vagina may also be collected. How do I prepare for this test? Be aware of where you are in your menstrual cycle. If you are menstruating on the day of the test, you may be asked to reschedule. You may need to reschedule if you have a known vaginal infection on the day of the test. Follow instructions from your health care provider about: Changing or stopping your regular medicines. Some medicines can cause abnormal test results, such as vaginal medicines and tetracycline. Avoiding douching 2-3 days before or the day of the test. Tell a health care provider about: Any allergies you have. All medicines you are taking, including vitamins, herbs, eye drops,  creams, and over-the-counter medicines. Any bleeding problems you have. Any surgeries you have had. Any medical conditions you have. Whether you are pregnant or may be pregnant. How are the results reported? Your test results will be reported as either abnormal or normal. What do the results mean? A normal test result means that you do not have signs of cancer of the cervix. An abnormal result may mean that you have: Cancer. A Pap test by itself is not enough to diagnose cancer. You will have more tests done if cancer is suspected. Precancerous changes in your cervix. Inflammation of the cervix. An STI (sexually transmitted infection). A fungal infection. A parasite infection. Talk with your health care provider about what your results mean. In some cases, your health care provider may do more testing to confirm the results. Questions to ask your health care provider Ask your health care provider, or the department that is doing the test: When will my results be ready? How will I get my results? What are my treatment options? What other tests do I need? What are my next steps? Summary In general, women should have a Pap test every 3 years until they reach menopause or age 65. Your health care provider will collect a sample of cells from the surface of your cervix. This will be done using a small cotton swab, plastic spatula, or brush. In some cases, fluids (secretions) from the cervix or vagina may also be collected. This information is not   intended to replace advice given to you by your health care provider. Make sure you discuss any questions you have with your health care provider. Document Revised: 01/18/2021 Document Reviewed: 01/18/2021 Elsevier Patient Education  2023 Elsevier Inc.  

## 2023-02-18 NOTE — Progress Notes (Signed)
Patient ID: Tammy [name], female   DOB: 04/21/1956, 67 y.o.   MRN: [id number]   Tammy [name], is a 67 y.o. female  [id number]  [id number]  DOB - 06/24/1956  Chief Complaint  Patient presents with   Gynecologic Exam       Subjective:   Tammy [name] is a 67 y.o. female here today for pap.  No issues or concerns.  No vaginal discharge.  No pelvic/abdominal pain.  No bleeding.  No h/o abnormal pap.    No problems updated.  ALLERGIES: No Known Allergies  PAST MEDICAL HISTORY: Past Medical History:  Diagnosis Date   Chronic headaches    DVT (deep venous thrombosis)    Leukemia    Told she had it at age 65, given some shots   Pulmonary embolism    Seasonal allergies     MEDICATIONS AT HOME: Prior to Admission medications   Medication Sig Start Date End Date Taking? Authorizing Provider  amLODipine (NORVASC) 5 MG tablet Take 1 tablet (5 mg total) by mouth daily. Must have office visit for refills 01/05/23  Yes Marcine Matar, MD  atorvastatin (LIPITOR) 20 MG tablet Take 1 tablet by mouth daily with a 40 mg tablet 01/05/23  Yes Marcine Matar, MD  DULoxetine (CYMBALTA) 30 MG capsule Take 1 capsule (30 mg total) by mouth daily. 02/09/23  Yes Patel, Donika K, DO  gabapentin (NEURONTIN) 300 MG capsule Take 1 capsule by mouth in the morning and take 3 capsules at bedtime. 09/03/22  Yes Patel, Donika K, DO  rivaroxaban (XARELTO) 20 MG TABS tablet Take 1 tablet (20 mg total) by mouth daily. 10/07/22  Yes Gorsuch, Ni, MD    ROS: Neg HEENT Neg resp Neg cardiac Neg GI Neg GU Neg MS Neg psych Neg neuro  Objective:   Vitals:   02/18/23 0917  BP: (!) 150/77  Pulse: 89  SpO2: 94%  Weight: 183 lb 9.6 oz (83.3 kg)   Exam General appearance : Awake, alert, not in any distress. Speech Clear. Not toxic looking HEENT: Atraumatic and Normocephalic Neck: Supple, no JVD. No cervical lymphadenopathy.  Chest: Good air entry bilaterally, CTAB.  No rales/rhonchi/wheezing CVS:  S1 S2 regular, no murmurs.  GU:  external genitalia WNL.  Speculum inserted.  Cervix WNL but difficult to keep in one place for pap swab-I think I got a good swab.  Bimanual unrmarkable Extremities: B/L Lower Ext shows no edema, both legs are warm to touch Neurology: Awake alert, and oriented X 3, CN II-XII intact, Non focal Skin: No Rash  Data Review Lab Results  Component Value Date   HGBA1C 5.3 05/29/2022   HGBA1C 5.5 07/04/2020   HGBA1C 5.7 05/13/2017    Assessment & Plan   1. Screening for cervical cancer - Cytology - PAP([place])    Return in about 4 months (around 06/20/2023) for PCP for chronic conditions.  The patient was given clear instructions to go to ER or return to medical center if symptoms don't improve, worsen or new problems develop. The patient verbalized understanding. The patient was told to call to get lab results if they haven't heard anything in the next week.      Georgian Co, PA-C [facility] and [facility] [place], [place] [phone]   02/18/2023, 9:37 AM

## 2023-02-20 LAB — CYTOLOGY - PAP
Adequacy: ABSENT
Comment: NEGATIVE
Diagnosis: NEGATIVE
High risk HPV: NEGATIVE

## 2023-02-23 DIAGNOSIS — G6181 Chronic inflammatory demyelinating polyneuritis: Secondary | ICD-10-CM | POA: Diagnosis not present

## 2023-03-05 DIAGNOSIS — G6181 Chronic inflammatory demyelinating polyneuritis: Secondary | ICD-10-CM | POA: Diagnosis not present

## 2023-03-06 DIAGNOSIS — G6181 Chronic inflammatory demyelinating polyneuritis: Secondary | ICD-10-CM | POA: Diagnosis not present

## 2023-03-19 ENCOUNTER — Other Ambulatory Visit (HOSPITAL_COMMUNITY): Payer: Self-pay

## 2023-03-23 DIAGNOSIS — G6181 Chronic inflammatory demyelinating polyneuritis: Secondary | ICD-10-CM | POA: Diagnosis not present

## 2023-03-24 DIAGNOSIS — G6181 Chronic inflammatory demyelinating polyneuritis: Secondary | ICD-10-CM | POA: Diagnosis not present

## 2023-04-13 ENCOUNTER — Other Ambulatory Visit (HOSPITAL_COMMUNITY): Payer: Self-pay

## 2023-04-20 DIAGNOSIS — G6181 Chronic inflammatory demyelinating polyneuritis: Secondary | ICD-10-CM | POA: Diagnosis not present

## 2023-04-21 DIAGNOSIS — G6181 Chronic inflammatory demyelinating polyneuritis: Secondary | ICD-10-CM | POA: Diagnosis not present

## 2023-05-01 ENCOUNTER — Telehealth: Payer: Self-pay | Admitting: Neurology

## 2023-05-01 NOTE — Telephone Encounter (Signed)
Pending review by Dr. Allena Katz then will fax.

## 2023-05-01 NOTE — Telephone Encounter (Signed)
Left message with the after hour service on 05-01-23 at 12:49 pm   Caller states that she is calling from [facility]  is regards to prior Auth the office needs to submit for patient

## 2023-05-06 ENCOUNTER — Telehealth: Payer: Self-pay | Admitting: Internal Medicine

## 2023-05-06 ENCOUNTER — Other Ambulatory Visit: Payer: Self-pay

## 2023-05-06 ENCOUNTER — Telehealth: Payer: Self-pay | Admitting: Neurology

## 2023-05-06 ENCOUNTER — Other Ambulatory Visit (HOSPITAL_COMMUNITY): Payer: Self-pay

## 2023-05-06 DIAGNOSIS — E782 Mixed hyperlipidemia: Secondary | ICD-10-CM

## 2023-05-06 MED ORDER — ATORVASTATIN CALCIUM 40 MG PO TABS
40.0000 mg | ORAL_TABLET | Freq: Every day | ORAL | 1 refills | Status: DC
Start: 2023-05-06 — End: 2023-10-09
  Filled 2023-05-06: qty 90, 90d supply, fill #0
  Filled 2023-08-03: qty 90, 90d supply, fill #1

## 2023-05-06 NOTE — Telephone Encounter (Signed)
Pt states that we need to prior auth  so that she can get some medication  and please call patient  she did not leave the name  of medication on the vm

## 2023-05-06 NOTE — Telephone Encounter (Signed)
Adela Lank from [facility] called as there were some conflicting information on the script for atorvastatin (LIPITOR) 20 MG tablet and she just wanted to clear it up for the pharmacy. The script is written to take 1 tablet by mouth daily with a 40mg  tablet and pharmacy is stating they never recvd a script for 40mg . Please reach out to patient for further instructions at [phone].

## 2023-05-06 NOTE — Telephone Encounter (Signed)
Clarity needed on the dosage for atorvastatin ( Lipitor). Please advise patient [phone].

## 2023-05-06 NOTE — Telephone Encounter (Signed)
Called patient and left a detailed message per DPR that we have been working with [employer] to get her PA done and have faxed all required documentation to them. If she had any questions she may give [place] a call back and I have provided her with my contact information.

## 2023-05-13 ENCOUNTER — Telehealth: Payer: Self-pay | Admitting: Neurology

## 2023-05-13 NOTE — Telephone Encounter (Signed)
Called patient and informed her that I have finally been able to get the paperwork faxed successfully. The Fax number provided was wrong. Patient aware to call me if any issues.

## 2023-05-13 NOTE — Telephone Encounter (Signed)
Pt is wanting to know if we have sent the auth form for medication the shot she takes every month

## 2023-05-25 ENCOUNTER — Other Ambulatory Visit (HOSPITAL_COMMUNITY): Payer: Self-pay

## 2023-06-03 DIAGNOSIS — G6181 Chronic inflammatory demyelinating polyneuritis: Secondary | ICD-10-CM | POA: Diagnosis not present

## 2023-06-04 DIAGNOSIS — G6181 Chronic inflammatory demyelinating polyneuritis: Secondary | ICD-10-CM | POA: Diagnosis not present

## 2023-06-08 DIAGNOSIS — G6181 Chronic inflammatory demyelinating polyneuritis: Secondary | ICD-10-CM | POA: Diagnosis not present

## 2023-06-29 DIAGNOSIS — G6181 Chronic inflammatory demyelinating polyneuritis: Secondary | ICD-10-CM | POA: Diagnosis not present

## 2023-06-30 DIAGNOSIS — G6181 Chronic inflammatory demyelinating polyneuritis: Secondary | ICD-10-CM | POA: Diagnosis not present

## 2023-07-02 ENCOUNTER — Other Ambulatory Visit (HOSPITAL_COMMUNITY): Payer: Self-pay

## 2023-07-11 ENCOUNTER — Emergency Department (HOSPITAL_COMMUNITY): Payer: 59

## 2023-07-11 ENCOUNTER — Other Ambulatory Visit: Payer: Self-pay

## 2023-07-11 ENCOUNTER — Emergency Department (HOSPITAL_COMMUNITY)
Admission: EM | Admit: 2023-07-11 | Discharge: 2023-07-11 | Disposition: A | Discharge status: Home / Self Care | Payer: 59 | Attending: Emergency Medicine | Admitting: Emergency Medicine

## 2023-07-11 ENCOUNTER — Encounter (HOSPITAL_COMMUNITY): Payer: Self-pay

## 2023-07-11 ENCOUNTER — Other Ambulatory Visit (HOSPITAL_COMMUNITY): Payer: Self-pay

## 2023-07-11 DIAGNOSIS — Z7901 Long term (current) use of anticoagulants: Secondary | ICD-10-CM | POA: Insufficient documentation

## 2023-07-11 DIAGNOSIS — M1712 Unilateral primary osteoarthritis, left knee: Secondary | ICD-10-CM | POA: Diagnosis not present

## 2023-07-11 DIAGNOSIS — M25562 Pain in left knee: Secondary | ICD-10-CM

## 2023-07-11 DIAGNOSIS — M79605 Pain in left leg: Secondary | ICD-10-CM | POA: Diagnosis not present

## 2023-07-11 MED ORDER — OXYCODONE-ACETAMINOPHEN 5-325 MG PO TABS
2.0000 | ORAL_TABLET | Freq: Once | ORAL | Status: AC
  Administered 2023-07-11: 2 via ORAL
  Filled 2023-07-11: qty 2

## 2023-07-11 MED ORDER — OXYCODONE-ACETAMINOPHEN 5-325 MG PO TABS
1.0000 | ORAL_TABLET | Freq: Four times a day (QID) | ORAL | 0 refills | Status: DC | PRN
  Filled 2023-07-11: qty 15, 4d supply, fill #0

## 2023-07-11 NOTE — ED Provider Notes (Signed)
[facility] EMERGENCY DEPARTMENT AT [facility] Provider Note   CSN: [id number] Arrival date & time: 07/11/23  2025     History  Chief Complaint  Patient presents with   Leg Pain    Tammy [name] is a 67 y.o. female.  67 year old female presents with left knee pain times several days.  History of DVT in the past.  She is on Xarelto and states that she has been compliant.  Denies any chest pain or shortness of breath.  Denies any trauma to her knee.  No fever or chills.  Pain starts at the bottom of the knee and goes up into her distal left thigh.  Denies any hip or back pain.       Home Medications Prior to Admission medications   Medication Sig Start Date End Date Taking? Authorizing Provider  amLODipine (NORVASC) 5 MG tablet Take 1 tablet (5 mg total) by mouth daily. Must have office visit for refills 01/05/23   Marcine Matar, MD  atorvastatin (LIPITOR) 40 MG tablet Take 1 tablet (40 mg total) by mouth daily. 05/06/23   Marcine Matar, MD  DULoxetine (CYMBALTA) 30 MG capsule Take 1 capsule (30 mg total) by mouth daily. 02/09/23   Nita Sickle K, DO  gabapentin (NEURONTIN) 300 MG capsule Take 1 capsule by mouth in the morning and take 3 capsules at bedtime. 09/03/22   Nita Sickle K, DO  rivaroxaban (XARELTO) 20 MG TABS tablet Take 1 tablet (20 mg total) by mouth daily. 10/07/22   Artis Delay, MD      Allergies    Patient has no known allergies.    Review of Systems   Review of Systems  All other systems reviewed and are negative.   Physical Exam Updated Vital Signs BP (!) 149/91   Pulse (!) 110   Temp 98.3 F (36.8 C) (Oral)   Resp 19   SpO2 93%  Physical Exam Vitals and nursing note reviewed.  Constitutional:      General: She is not in acute distress.    Appearance: Normal appearance. She is well-developed. She is not toxic-appearing.  HENT:     Head: Normocephalic and atraumatic.  Eyes:     General: Lids are normal.     Conjunctiva/sclera:  Conjunctivae normal.     Pupils: Pupils are equal, round, and reactive to light.  Neck:     Thyroid: No thyroid mass.     Trachea: No tracheal deviation.  Cardiovascular:     Rate and Rhythm: Normal rate and regular rhythm.     Heart sounds: Normal heart sounds. No murmur heard.    No gallop.  Pulmonary:     Effort: Pulmonary effort is normal. No respiratory distress.     Breath sounds: Normal breath sounds. No stridor. No decreased breath sounds, wheezing, rhonchi or rales.  Abdominal:     General: There is no distension.     Palpations: Abdomen is soft.     Tenderness: There is no abdominal tenderness. There is no rebound.  Musculoskeletal:        General: No tenderness. Normal range of motion.     Cervical back: Normal range of motion and neck supple.     Left knee: No swelling, deformity or effusion.       Legs:     Comments: Neurovascular intact at left foot  Skin:    General: Skin is warm and dry.     Findings: No abrasion or rash.  Neurological:     Mental Status: She is alert and oriented to person, place, and time. Mental status is at baseline.     GCS: GCS eye subscore is 4. GCS verbal subscore is 5. GCS motor subscore is 6.     Cranial Nerves: Cranial nerves are intact. No cranial nerve deficit.     Sensory: No sensory deficit.     Motor: Motor function is intact.  Psychiatric:        Attention and Perception: Attention normal.        Speech: Speech normal.        Behavior: Behavior normal.     ED Results / Procedures / Treatments   Labs (all labs ordered are listed, but only abnormal results are displayed) Labs Reviewed - No data to display  EKG None  Radiology No results found.  Procedures Procedures    Medications Ordered in ED Medications  oxyCODONE-acetaminophen (PERCOCET/ROXICET) 5-325 MG per tablet 2 tablet (has no administration in time range)    ED Course/ Medical Decision Making/ A&P                                 Medical Decision  Making Amount and/or Complexity of Data Reviewed Radiology: ordered.  Risk Prescription drug management.   Patient medicated with Percocet here and feels better.  X-ray of left knee per interpretation shows arthritis of possible effusion.  No evidence of septic joint at this time per my physical exam.  No concern for DVT as patient is already on Eliquis.  Will discharge home        Final Clinical Impression(s) / ED Diagnoses Final diagnoses:  None    Rx / DC Orders ED Discharge Orders     None         Lorre Nick, MD 07/11/23 2240

## 2023-07-11 NOTE — ED Triage Notes (Signed)
Pt. Arrives POV for left leg pain. Pt. States that her pain is so bad that she cannot put any pressure on it. She describes pain as a throbbing sensation from the front and back of the calf to the mid thigh region. Pt. Denies increasing pain on palpation. Denies swelling of the extremity.

## 2023-07-13 ENCOUNTER — Other Ambulatory Visit: Payer: Self-pay

## 2023-07-27 DIAGNOSIS — G6181 Chronic inflammatory demyelinating polyneuritis: Secondary | ICD-10-CM | POA: Diagnosis not present

## 2023-07-28 DIAGNOSIS — G6181 Chronic inflammatory demyelinating polyneuritis: Secondary | ICD-10-CM | POA: Diagnosis not present

## 2023-08-03 ENCOUNTER — Other Ambulatory Visit (HOSPITAL_COMMUNITY): Payer: Self-pay

## 2023-08-13 ENCOUNTER — Encounter

## 2023-08-14 ENCOUNTER — Encounter

## 2023-08-18 ENCOUNTER — Ambulatory Visit: Payer: 59 | Admitting: Neurology

## 2023-08-21 ENCOUNTER — Encounter

## 2023-08-24 DIAGNOSIS — G6181 Chronic inflammatory demyelinating polyneuritis: Secondary | ICD-10-CM | POA: Diagnosis not present

## 2023-08-27 DIAGNOSIS — G6181 Chronic inflammatory demyelinating polyneuritis: Secondary | ICD-10-CM | POA: Diagnosis not present

## 2023-08-28 DIAGNOSIS — G6181 Chronic inflammatory demyelinating polyneuritis: Secondary | ICD-10-CM | POA: Diagnosis not present

## 2023-09-01 ENCOUNTER — Other Ambulatory Visit: Payer: Self-pay | Admitting: Internal Medicine

## 2023-09-01 ENCOUNTER — Other Ambulatory Visit: Payer: Self-pay

## 2023-09-01 DIAGNOSIS — I10 Essential (primary) hypertension: Secondary | ICD-10-CM

## 2023-09-01 MED ORDER — AMLODIPINE BESYLATE 5 MG PO TABS
5.0000 mg | ORAL_TABLET | Freq: Every day | ORAL | 0 refills | Status: DC
Start: 2023-09-01 — End: 2023-10-09
  Filled 2023-09-01: qty 30, 30d supply, fill #0

## 2023-09-02 ENCOUNTER — Other Ambulatory Visit (HOSPITAL_COMMUNITY): Payer: Self-pay

## 2023-09-09 ENCOUNTER — Encounter: Payer: Self-pay | Admitting: Physician Assistant

## 2023-09-09 ENCOUNTER — Ambulatory Visit: Payer: 59 | Attending: Physician Assistant | Admitting: Physician Assistant

## 2023-09-09 VITALS — BP 135/76 | HR 114 | Wt 183.8 lb

## 2023-09-09 DIAGNOSIS — M25562 Pain in left knee: Secondary | ICD-10-CM

## 2023-09-09 MED ORDER — TRAMADOL HCL 50 MG PO TABS
50.0000 mg | ORAL_TABLET | Freq: Three times a day (TID) | ORAL | 0 refills | Status: AC | PRN
Start: 2023-09-09 — End: 2023-09-14

## 2023-09-09 NOTE — Progress Notes (Signed)
Patient ID: Tammy [name], female   DOB: 1956-04-21, 67 y.o.   MRN: [id number]   Tammy [name], is a 67 y.o. female  WUJ:[id number]  [id number]  DOB - July 15, 1956  Chief Complaint  Patient presents with   Knee Pain    Left knee       Subjective:   Tammy [name] is a 67 y.o. female here today for a follow up visit after being seen in the ED in September for L knee pain that has been going on for a couple months now. No fever or swelling.  Xray showed arthritis. Percocet helped.  Tylenol and topical gel did not help much.  Pain is constant.  No calf pain.  Compliant with blood thinners  No problems updated.  ALLERGIES: No Known Allergies  PAST MEDICAL HISTORY: Past Medical History:  Diagnosis Date   Chronic headaches    DVT (deep venous thrombosis) (HCC)    Leukemia (HCC)    Told she had it at age 70, given some shots   Pulmonary embolism (HCC)    Seasonal allergies     MEDICATIONS AT HOME: Prior to Admission medications   Medication Sig Start Date End Date Taking? Authorizing Provider  amLODipine (NORVASC) 5 MG tablet Take 1 tablet (5 mg total) by mouth daily. Must have office visit for refills 09/01/23  Yes Marcine Matar, MD  atorvastatin (LIPITOR) 40 MG tablet Take 1 tablet (40 mg total) by mouth daily. 05/06/23  Yes Marcine Matar, MD  DULoxetine (CYMBALTA) 30 MG capsule Take 1 capsule (30 mg total) by mouth daily. 02/09/23  Yes Patel, Donika K, DO  gabapentin (NEURONTIN) 300 MG capsule Take 1 capsule by mouth in the morning and take 3 capsules at bedtime. 09/03/22  Yes Glendale Chard, DO  GAMMAGARD 20 GM/200ML SOLN  04/10/23  Yes [provider]  GAMMAGARD 5 GM/50ML SOLN  04/10/23  Yes [provider]  rivaroxaban (XARELTO) 20 MG TABS tablet Take 1 tablet (20 mg total) by mouth daily. 10/07/22  Yes Artis Delay, MD  traMADol (ULTRAM) 50 MG tablet Take 1 tablet (50 mg total) by mouth every 8 (eight) hours as needed for up to 5 days. 09/09/23 09/14/23 Yes  Laquentin Loudermilk, Marzella Schlein, PA-C    ROS: Neg HEENT Neg resp Neg cardiac Neg GI Neg GU Neg psych Neg neuro  Objective:   Vitals:   09/09/23 0959  BP: 135/76  Pulse: (!) 114  SpO2: 94%  Weight: 183 lb 12.8 oz (83.4 kg)   Exam General appearance : Awake, alert, not in any distress. Speech Clear. Not toxic looking HEENT: Atraumatic and Normocephalic, pupils equally reactive to light and accomodation Neck: Supple, no JVD. No cervical lymphadenopathy.  Chest: Good air entry bilaterally, CTAB.  No rales/rhonchi/wheezing CVS: S1 S2 regular, no murmurs. Rate at 88 on exam.   L knee examined compared to R.  Ligaments stable.  No erythema/induration/obvious effusion or ballotment.  No calf erythema or tenderness Extremities: B/L Lower Ext shows no edema, both legs are warm to touch Neurology: Awake alert, and oriented X 3, CN II-XII intact, Non focal Skin: No Rash  Data Review Lab Results  Component Value Date   HGBA1C 5.3 05/29/2022   HGBA1C 5.5 07/04/2020   HGBA1C 5.7 05/13/2017    Assessment & Plan   1. Left knee pain, unspecified chronicity Gave ace wrap to wear loosely for support - traMADol (ULTRAM) 50 MG tablet; Take 1 tablet (50 mg total) by mouth every 8 (eight)  hours as needed for up to 5 days.  Dispense: 15 tablet; Refill: 0 - Ambulatory referral to Orthopedic Surgery    Return in about 3 months (around 12/10/2023) for PCP for chronic conditions-[place].  The patient was given clear instructions to go to ER or return to medical center if symptoms don't improve, worsen or new problems develop. The patient verbalized understanding. The patient was told to call to get lab results if they haven't heard anything in the next week.      Georgian Co, PA-C [facility] [place], [place] [phone]   09/09/2023, 10:26 AM

## 2023-09-10 ENCOUNTER — Inpatient Hospital Stay: Admit: 2023-09-10 | Payer: MEDICARE | Primary: Family Medicine

## 2023-09-10 ENCOUNTER — Encounter

## 2023-09-10 ENCOUNTER — Other Ambulatory Visit (HOSPITAL_COMMUNITY): Payer: Self-pay

## 2023-09-10 VITALS — Ht 67.0 in | Wt 161.0 lb

## 2023-09-10 DIAGNOSIS — R42 Dizziness and giddiness: Secondary | ICD-10-CM

## 2023-09-10 DIAGNOSIS — Z13820 Encounter for screening for osteoporosis: Secondary | ICD-10-CM

## 2023-09-10 DIAGNOSIS — Z1231 Encounter for screening mammogram for malignant neoplasm of breast: Secondary | ICD-10-CM

## 2023-09-10 MED ORDER — TRAMADOL HCL 50 MG PO TABS
50.0000 mg | ORAL_TABLET | Freq: Three times a day (TID) | ORAL | 0 refills | Status: AC | PRN
  Filled 2023-09-10: qty 15, 5d supply, fill #0

## 2023-09-10 MED ORDER — GADOTERIDOL 279.3 MG/ML IV SOLN
279.3 | Freq: Once | INTRAVENOUS | Status: AC | PRN
Start: 2023-09-10 — End: 2023-09-10
  Administered 2023-09-10: 16:00:00 15 mL via INTRAVENOUS

## 2023-09-17 ENCOUNTER — Other Ambulatory Visit: Payer: Self-pay

## 2023-09-17 ENCOUNTER — Other Ambulatory Visit (HOSPITAL_COMMUNITY): Payer: Self-pay

## 2023-09-17 ENCOUNTER — Ambulatory Visit: Payer: 59 | Admitting: Orthopaedic Surgery

## 2023-09-17 DIAGNOSIS — M1712 Unilateral primary osteoarthritis, left knee: Secondary | ICD-10-CM | POA: Diagnosis not present

## 2023-09-17 MED ORDER — PREDNISONE 10 MG (21) PO TBPK
ORAL_TABLET | ORAL | 3 refills | Status: DC
  Filled 2023-09-17: qty 21, 6d supply, fill #0

## 2023-09-17 NOTE — Progress Notes (Signed)
Office Visit Note   Patient: Tammy [name]           Date of Birth: [birth year]           MRN: [id number] Visit Date: 09/17/2023              Requested by: Anders Simmonds, PA-C [address] [place],  [place] [address] PCP: Marcine Matar, MD   Assessment & Plan: Visit Diagnoses:  1. Primary osteoarthritis of left knee     Plan: Tammy [name] is a 67 year old female with left knee pain impression osteoarthritis flare.  Treatment options given.  She cannot take NSAIDs due to Xarelto.  She would like to try a 6-day course of a steroid Dosepak instead of doing a cortisone injection.  She will follow-up if symptoms do not improve.  Follow-Up Instructions: No follow-ups on file.   Orders:  No orders of the defined types were placed in this encounter.  No orders of the defined types were placed in this encounter.     Procedures: No procedures performed   Clinical Data: No additional findings.   Subjective: Chief Complaint  Patient presents with   Left Knee - Pain    HPI Patient is a 67 year old female here for evaluation of chronic left knee pain since September.  Started after she went to [employer].  Then she went to the ER for evaluation.  X-rays show osteoarthritis.  She has been taking Advil and Tylenol.  Rates the pain as 7 out of 10.  Denies any mechanical symptoms.  Review of Systems  Constitutional: Negative.   HENT: Negative.    Eyes: Negative.   Respiratory: Negative.    Cardiovascular: Negative.   Endocrine: Negative.   Musculoskeletal: Negative.   Neurological: Negative.   Hematological: Negative.   Psychiatric/Behavioral: Negative.    All other systems reviewed and are negative.    Objective: Vital Signs: There were no vitals taken for this visit.  Physical Exam Vitals and nursing note reviewed.  Constitutional:      Appearance: She is well-developed.  HENT:     Head: Atraumatic.     Nose: Nose normal.  Eyes:     Extraocular  Movements: Extraocular movements intact.  Cardiovascular:     Pulses: Normal pulses.  Pulmonary:     Effort: Pulmonary effort is normal.  Abdominal:     Palpations: Abdomen is soft.  Musculoskeletal:     Cervical back: Neck supple.  Skin:    General: Skin is warm.     Capillary Refill: Capillary refill takes less than 2 seconds.  Neurological:     Mental Status: She is alert. Mental status is at baseline.  Psychiatric:        Behavior: Behavior normal.        Thought Content: Thought content normal.        Judgment: Judgment normal.     Ortho Exam Exam of the left knee shows minor crepitus.  Normal range of motion.  No joint line tenderness.  No joint effusion. Specialty Comments:  No specialty comments available.  Imaging: X-rays of the left knee independently reviewed and interpreted shows mild tricompartment osteoarthritis.   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   Positive ANA (antinuclear antibody) 12/24/2015   Generalized anxiety disorder 12/24/2015   Dyslipidemia 10/09/2014   Cervical spondylolysis 10/09/2014   DVT (deep venous thrombosis) (HCC) 08/22/2013   LGSIL (low grade squamous intraepithelial lesion) on Pap smear 08/04/2013   Sleep apnea 05/21/2011   Past Medical History:  Diagnosis Date   Chronic headaches    DVT (deep venous thrombosis) (HCC)    Leukemia (HCC)    Told she had it at age 23, given some shots   Pulmonary embolism (HCC)    Seasonal allergies     Family History  Problem Relation Age of Onset   Emphysema Father    Breast cancer Mother    Breast cancer Maternal  Grandmother    Heart disease Sister    Cancer Sister    Diabetes Brother     Past Surgical History:  Procedure Laterality Date   CESAREAN SECTION     one previous   right arm fracture     TUBAL LIGATION     Social History   Occupational History   Occupation: unemployed  Tobacco Use   Smoking status: Light Smoker    Current packs/day: 0.00    Types: Cigarettes   Smokeless tobacco: Never   Tobacco comments:    Patient reports smoking 2-3 cigarettes a day   Vaping Use   Vaping status: Never Used  Substance and Sexual Activity   Alcohol use: No   Drug use: No   Sexual activity: Yes

## 2023-09-21 DIAGNOSIS — G6181 Chronic inflammatory demyelinating polyneuritis: Secondary | ICD-10-CM | POA: Diagnosis not present

## 2023-09-23 DIAGNOSIS — G6181 Chronic inflammatory demyelinating polyneuritis: Secondary | ICD-10-CM | POA: Diagnosis not present

## 2023-09-24 DIAGNOSIS — G6181 Chronic inflammatory demyelinating polyneuritis: Secondary | ICD-10-CM | POA: Diagnosis not present

## 2023-09-25 ENCOUNTER — Telehealth: Payer: Self-pay | Admitting: Internal Medicine

## 2023-09-25 NOTE — Telephone Encounter (Signed)
Copied from CRM [id number]. Topic: General - Other >> Sep 23, 2023  3:04 PM Phill Myron wrote: Please call Ms. Darin Engels... regarding [employer] Medicare... she is trying to put Dr Laural Benes on Pt. Hardge's plan but Dr Laural Benes as well as other providers are not showing up as [facility] Medicare provider..  She ask are the doctors in the process of renewing their contracts? Please advise and notate.

## 2023-10-06 ENCOUNTER — Ambulatory Visit: Payer: 59 | Attending: Internal Medicine

## 2023-10-08 ENCOUNTER — Encounter: Payer: Self-pay | Admitting: Hematology and Oncology

## 2023-10-08 ENCOUNTER — Inpatient Hospital Stay: Payer: 59 | Admitting: Hematology and Oncology

## 2023-10-08 ENCOUNTER — Inpatient Hospital Stay: Payer: 59 | Attending: Hematology and Oncology

## 2023-10-08 DIAGNOSIS — Z7901 Long term (current) use of anticoagulants: Secondary | ICD-10-CM | POA: Insufficient documentation

## 2023-10-08 DIAGNOSIS — Z86718 Personal history of other venous thrombosis and embolism: Secondary | ICD-10-CM | POA: Insufficient documentation

## 2023-10-09 ENCOUNTER — Other Ambulatory Visit (HOSPITAL_COMMUNITY): Payer: Self-pay

## 2023-10-09 ENCOUNTER — Other Ambulatory Visit: Payer: Self-pay | Admitting: Internal Medicine

## 2023-10-09 ENCOUNTER — Other Ambulatory Visit: Payer: Self-pay

## 2023-10-09 ENCOUNTER — Other Ambulatory Visit: Payer: Self-pay | Admitting: Hematology and Oncology

## 2023-10-09 DIAGNOSIS — E782 Mixed hyperlipidemia: Secondary | ICD-10-CM

## 2023-10-09 DIAGNOSIS — I10 Essential (primary) hypertension: Secondary | ICD-10-CM

## 2023-10-09 MED ORDER — AMLODIPINE BESYLATE 5 MG PO TABS
5.0000 mg | ORAL_TABLET | Freq: Every day | ORAL | 0 refills | Status: DC
  Filled 2023-10-09: qty 90, 90d supply, fill #0

## 2023-10-09 MED ORDER — RIVAROXABAN 20 MG PO TABS
20.0000 mg | ORAL_TABLET | Freq: Every day | ORAL | 1 refills | Status: DC
  Filled 2023-10-09: qty 30, 30d supply, fill #0

## 2023-10-09 MED ORDER — ATORVASTATIN CALCIUM 40 MG PO TABS
40.0000 mg | ORAL_TABLET | Freq: Every day | ORAL | 0 refills | Status: DC
  Filled 2023-10-09 – 2023-11-12 (×2): qty 90, 90d supply, fill #0

## 2023-10-15 ENCOUNTER — Inpatient Hospital Stay (HOSPITAL_BASED_OUTPATIENT_CLINIC_OR_DEPARTMENT_OTHER): Payer: 59 | Admitting: Hematology and Oncology

## 2023-10-15 ENCOUNTER — Other Ambulatory Visit: Payer: 59

## 2023-10-15 ENCOUNTER — Other Ambulatory Visit: Payer: Self-pay

## 2023-10-15 ENCOUNTER — Encounter: Payer: Self-pay | Admitting: Hematology and Oncology

## 2023-10-15 ENCOUNTER — Other Ambulatory Visit (HOSPITAL_COMMUNITY): Payer: Self-pay

## 2023-10-15 VITALS — BP 141/74 | HR 100 | Temp 97.6°F | Resp 18 | Ht 62.0 in | Wt 181.8 lb

## 2023-10-15 DIAGNOSIS — Z86718 Personal history of other venous thrombosis and embolism: Secondary | ICD-10-CM | POA: Diagnosis not present

## 2023-10-15 DIAGNOSIS — I825Y9 Chronic embolism and thrombosis of unspecified deep veins of unspecified proximal lower extremity: Secondary | ICD-10-CM

## 2023-10-15 DIAGNOSIS — N183 Chronic kidney disease, stage 3 unspecified: Secondary | ICD-10-CM

## 2023-10-15 DIAGNOSIS — Z7901 Long term (current) use of anticoagulants: Secondary | ICD-10-CM | POA: Diagnosis not present

## 2023-10-15 DIAGNOSIS — G6181 Chronic inflammatory demyelinating polyneuritis: Secondary | ICD-10-CM

## 2023-10-15 LAB — CBC WITH DIFFERENTIAL/PLATELET
Abs Immature Granulocytes: 0.04 K/uL (ref 0.00–0.07)
Basophils Absolute: 0.1 K/uL (ref 0.0–0.1)
Basophils Relative: 1 %
Eosinophils Absolute: 0.1 K/uL (ref 0.0–0.5)
Eosinophils Relative: 1 %
HCT: 39 % (ref 36.0–46.0)
Hemoglobin: 12.7 g/dL (ref 12.0–15.0)
Immature Granulocytes: 1 %
Lymphocytes Relative: 32 %
Lymphs Abs: 2.8 K/uL (ref 0.7–4.0)
MCH: 30.5 pg (ref 26.0–34.0)
MCHC: 32.6 g/dL (ref 30.0–36.0)
MCV: 93.5 fL (ref 80.0–100.0)
Monocytes Absolute: 0.6 K/uL (ref 0.1–1.0)
Monocytes Relative: 7 %
Neutro Abs: 5 K/uL (ref 1.7–7.7)
Neutrophils Relative %: 58 %
Platelets: 387 K/uL (ref 150–400)
RBC: 4.17 MIL/uL (ref 3.87–5.11)
RDW: 13.5 % (ref 11.5–15.5)
WBC: 8.5 K/uL (ref 4.0–10.5)
nRBC: 0 % (ref 0.0–0.2)

## 2023-10-15 MED ORDER — RIVAROXABAN 20 MG PO TABS
20.0000 mg | ORAL_TABLET | Freq: Every day | ORAL | 11 refills | Status: DC
  Filled 2023-10-15 – 2023-11-12 (×2): qty 30, 30d supply, fill #0
  Filled 2023-12-09: qty 30, 30d supply, fill #1
  Filled 2024-01-15: qty 30, 30d supply, fill #2
  Filled 2024-02-18: qty 30, 30d supply, fill #3
  Filled 2024-03-17: qty 30, 30d supply, fill #4
  Filled 2024-04-21: qty 30, 30d supply, fill #5
  Filled 2024-05-24: qty 30, 30d supply, fill #6
  Filled 2024-06-20: qty 30, 30d supply, fill #7
  Filled 2024-07-25: qty 30, 30d supply, fill #8
  Filled 2024-08-25: qty 30, 30d supply, fill #9
  Filled 2024-09-23: qty 30, 30d supply, fill #10

## 2023-10-15 NOTE — Assessment & Plan Note (Signed)
She is doing with very well on long term Xarelto. She has no complications from treatment. Goal of treatment is lifelong. I will see her once a year for toxicity review and medication refills She is educated to watch for signs and symptoms of bleeding

## 2023-10-15 NOTE — Progress Notes (Signed)
[facility] OFFICE PROGRESS NOTE  Marcine Matar, MD  ASSESSMENT & PLAN:  DVT (deep venous thrombosis) She is doing with very well on long term Xarelto. She has no complications from treatment. Goal of treatment is lifelong. I will see her once a year for toxicity review and medication refills She is educated to watch for signs and symptoms of bleeding  Orders Placed This Encounter  Procedures   Basic Metabolic Panel - Cancer Center Only    Standing Status:   Future    Expiration Date:   10/14/2024    The total time spent in the appointment was 20 minutes encounter with patients including review of chart and various tests results, discussions about plan of care and coordination of care plan   All questions were answered. The patient knows to call the clinic with any problems, questions or concerns. No barriers to learning was detected.    Artis Delay, MD 12/12/202411:38 AM  INTERVAL HISTORY: Tammy [name] 67 y.o. 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 According to the patient, she was first diagnosed with blood clot in 2008. According to the patient it was unprovoked. The patient complained of shortness of breath and cough and chest discomfort. She was smoking at that time. She had imaging study of the lung done which show evidence of PE. According to the patient she was on a blood thinner for 2 years. The date of the CT scan was 06/24/2007. In 2014, she started to complain of intermittent sharp discomfort on the right leg associated with some mild swelling. She states that she had pain on the left leg as well but is not as severe compared to the right leg. She went to the emergency  department. Results of the ultrasound venous Doppler came back consistent with acute deep vein thrombosis involving the posterior tibial and perioneal vein of the right lower extremity. D-dimer was elevated. She was placed on Lovenox and transition to warfarin 5 mg daily. Her INR has been supratherapeutic requiring vitamin K and multiple dose interruption to keep her INR at the desire range at 2-3. She denies any bleeding complications such as spontaneous epistaxis, hematuria, hematochezia, or the need for blood transfusions. She had mild intermittent chest discomfort throughout this and had imaging study done which show possible new clot in the left lung on the CT scan from September 2014. She has been pregnant 4 times including C-section for her pregnancy and never developed any peripartum blood clots. There were no family history of blood clots. In October 2014, I made a decision to change her to Xarelto.  Duration of treatment is lifelong  I have reviewed the past medical history, past surgical history, social history and family history with the patient and they are unchanged from previous note.  ALLERGIES:  has no known allergies.  MEDICATIONS:  Current Outpatient Medications  Medication Sig Dispense Refill   amLODipine (NORVASC) 5 MG tablet Take 1 tablet (5 mg total) by mouth daily. 90 tablet 0   atorvastatin (LIPITOR) 40 MG tablet Take 1 tablet (40 mg total) by mouth daily. 90 tablet 0   DULoxetine (CYMBALTA) 30 MG capsule Take 1 capsule (30 mg total) by mouth daily. 90 capsule 3   gabapentin (NEURONTIN) 300 MG  capsule Take 1 capsule by mouth in the morning and take 3 capsules at bedtime. 360 capsule 3   GAMMAGARD 20 GM/200ML SOLN      GAMMAGARD 5 GM/50ML SOLN      predniSONE (STERAPRED UNI-PAK 21 TAB) 10 MG (21) TBPK tablet Take as directed on package 21 tablet 3   rivaroxaban (XARELTO) 20 MG TABS tablet Take 1 tablet (20 mg total) by mouth daily. 30 tablet 11   traMADol (ULTRAM) 50 MG  tablet Take 1 tablet (50 mg total) by mouth every 8 (eight) hours as needed. 15 tablet 0   No current facility-administered medications for this visit.     REVIEW OF SYSTEMS:   Constitutional: Denies fevers, chills or night sweats Eyes: Denies blurriness of vision Ears, nose, mouth, throat, and face: Denies mucositis or sore throat Respiratory: Denies cough, dyspnea or wheezes Cardiovascular: Denies palpitation, chest discomfort or lower extremity swelling Gastrointestinal:  Denies nausea, heartburn or change in bowel habits Skin: Denies abnormal skin rashes Lymphatics: Denies new lymphadenopathy or easy bruising Neurological:Denies numbness, tingling or new weaknesses Behavioral/Psych: Mood is stable, no new changes  All other systems were reviewed with the patient and are negative.  PHYSICAL EXAMINATION: ECOG PERFORMANCE STATUS: 0 - Asymptomatic  Vitals:   10/15/23 1109  BP: (!) 141/74  Pulse: 100  Resp: 18  Temp: 97.6 F (36.4 C)  SpO2: 98%   Filed Weights   10/15/23 1109  Weight: 181 lb 12.8 oz (82.5 kg)    GENERAL:alert, no distress and comfortable   LABORATORY DATA:  I have reviewed the data as listed     Component Value Date/Time   NA 142 01/05/2023 1628   NA 141 08/11/2013 1046   K 4.4 01/05/2023 1628   K 4.3 08/11/2013 1046   CL 104 01/05/2023 1628   CO2 23 01/05/2023 1628   CO2 27 08/11/2013 1046   GLUCOSE 86 01/05/2023 1628   GLUCOSE 125 (H) 10/07/2021 0920   GLUCOSE 102 08/11/2013 1046   BUN 11 01/05/2023 1628   BUN 10.5 08/11/2013 1046   CREATININE 0.96 01/05/2023 1628   CREATININE 1.11 (H) 10/07/2021 0920   CREATININE 0.99 10/22/2015 1159   CREATININE 1.0 08/11/2013 1046   CALCIUM 9.8 01/05/2023 1628   CALCIUM 9.5 08/11/2013 1046   PROT 7.3 05/29/2022 0925   PROT 7.3 08/11/2013 1046   ALBUMIN 4.3 05/29/2022 0925   ALBUMIN 4.1 11/10/2017 1444   ALBUMIN 3.4 (L) 08/11/2013 1046   AST 17 05/29/2022 0925   AST 17 10/06/2019 0932   AST 15  08/11/2013 1046   ALT 12 05/29/2022 0925   ALT 14 10/06/2019 0932   ALT 13 08/11/2013 1046   ALKPHOS 120 05/29/2022 0925   ALKPHOS 112 08/11/2013 1046   BILITOT 0.6 05/29/2022 0925   BILITOT 0.7 10/06/2019 0932   BILITOT 0.73 08/11/2013 1046   GFRNONAA 56 (L) 10/07/2021 0920   GFRNONAA 63 10/22/2015 1159   GFRAA 65 07/04/2020 0958   GFRAA 53 (L) 10/06/2019 0932   GFRAA 73 10/22/2015 1159    No results found for: "SPEP", "UPEP"  Lab Results  Component Value Date   WBC 8.5 10/15/2023   NEUTROABS 5.0 10/15/2023   HGB 12.7 10/15/2023   HCT 39.0 10/15/2023   MCV 93.5 10/15/2023   PLT 387 10/15/2023      Chemistry      Component Value Date/Time   NA 142 01/05/2023 1628   NA 141 08/11/2013 1046   K 4.4 01/05/2023 1628  K 4.3 08/11/2013 1046   CL 104 01/05/2023 1628   CO2 23 01/05/2023 1628   CO2 27 08/11/2013 1046   BUN 11 01/05/2023 1628   BUN 10.5 08/11/2013 1046   CREATININE 0.96 01/05/2023 1628   CREATININE 1.11 (H) 10/07/2021 0920   CREATININE 0.99 10/22/2015 1159   CREATININE 1.0 08/11/2013 1046      Component Value Date/Time   CALCIUM 9.8 01/05/2023 1628   CALCIUM 9.5 08/11/2013 1046   ALKPHOS 120 05/29/2022 0925   ALKPHOS 112 08/11/2013 1046   AST 17 05/29/2022 0925   AST 17 10/06/2019 0932   AST 15 08/11/2013 1046   ALT 12 05/29/2022 0925   ALT 14 10/06/2019 0932   ALT 13 08/11/2013 1046   BILITOT 0.6 05/29/2022 0925   BILITOT 0.7 10/06/2019 0932   BILITOT 0.73 08/11/2013 1046

## 2023-10-19 DIAGNOSIS — G6181 Chronic inflammatory demyelinating polyneuritis: Secondary | ICD-10-CM | POA: Diagnosis not present

## 2023-10-21 DIAGNOSIS — G6181 Chronic inflammatory demyelinating polyneuritis: Secondary | ICD-10-CM | POA: Diagnosis not present

## 2023-10-22 DIAGNOSIS — G6181 Chronic inflammatory demyelinating polyneuritis: Secondary | ICD-10-CM | POA: Diagnosis not present

## 2023-11-12 ENCOUNTER — Other Ambulatory Visit: Payer: Self-pay

## 2023-11-12 ENCOUNTER — Other Ambulatory Visit (HOSPITAL_COMMUNITY): Payer: Self-pay

## 2023-12-09 ENCOUNTER — Encounter: Payer: Self-pay | Admitting: Neurology

## 2023-12-09 ENCOUNTER — Other Ambulatory Visit (HOSPITAL_COMMUNITY): Payer: Self-pay

## 2023-12-09 ENCOUNTER — Ambulatory Visit (INDEPENDENT_AMBULATORY_CARE_PROVIDER_SITE_OTHER): Payer: 59 | Admitting: Neurology

## 2023-12-09 ENCOUNTER — Other Ambulatory Visit: Payer: Self-pay

## 2023-12-09 VITALS — BP 135/80 | HR 112 | Ht 62.0 in | Wt 180.0 lb

## 2023-12-09 DIAGNOSIS — G6181 Chronic inflammatory demyelinating polyneuritis: Secondary | ICD-10-CM

## 2023-12-09 MED ORDER — DULOXETINE HCL 30 MG PO CPEP
30.0000 mg | ORAL_CAPSULE | Freq: Two times a day (BID) | ORAL | 3 refills | Status: AC
  Filled 2023-12-09: qty 180, 90d supply, fill #0
  Filled 2024-03-17: qty 180, 90d supply, fill #1
  Filled 2024-06-20: qty 180, 90d supply, fill #2
  Filled 2024-09-23: qty 180, 90d supply, fill #3

## 2023-12-09 MED ORDER — GABAPENTIN 300 MG PO CAPS
ORAL_CAPSULE | ORAL | 3 refills | Status: AC
  Filled 2023-12-09: qty 360, 90d supply, fill #0
  Filled 2024-03-17 – 2024-05-24 (×2): qty 360, 90d supply, fill #1
  Filled 2024-08-25: qty 360, 90d supply, fill #2
  Filled 2024-11-30: qty 360, 90d supply, fill #3

## 2023-12-09 NOTE — Progress Notes (Signed)
 Follow-up Visit   Date: 12/09/23    JALEIAH ASAY MRN: [id number] DOB: 1956-01-19   Interim History: Tammy [name] is a 68 y.o. 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  The patient was accompanied to the clinic by self.  IMPRESSION/PLAN: Chronic inflammatory demyelinating polyradiculoneuropathy (diagnosed 2018) presenting with paresthesia and weakness in the upper extremities, supported by EMG and CSF.  IVIG started in 07/2018 every 21 days, which has stabilized symptoms.  Repeat EMG from 2023 shows progressive neuropathy, and therefore it was decided to continue IVIG every 4 weeks.  She has ongoing paresthesias, so will optimize duloxetine .  - Continue IVIG 1g/kg every 4 weeks  - Increase duloxetine  to 30mg  twice daily  - Continue gabapentin  600mg  twice daily (unable to titrate due to sedation)  Return to clinic in 1 year   --------------------------------------------------------------------------- History of present illness: Starting around 2018, she began having numbness and tingling of the hands. She has some weakness of her grip, such as holding a phone or opening jars.  Symptoms are worse at night time and often wake her up from sleeping.  She denies any chronic neck pain.  She takes gabapentin  300mg  three times daily and nortriptyline  25mg  , which does not provide any relief.   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 Because of clinical suspicion for CIDP, she had induction dose of IVMP and has noticed some improvement in her burning sensation.  NCS/EMG of the legs was normal.  Labs also indicated thiamine  deficiency and she was started on supplementation.  She was switched to home IVIG in September  2019 due to lack of benefit on steroids.  She has noticed mild improvement only.  Repeat NCS/EMG in 2020 shows progressive neuropathy.  UPDATE 09/03/2022:  She is here for follow-up visit.  She continues to have burning and tingling of the hands, which is worse at night time.  She wld like to increase the doseof night time gabapentin .  She currently takes gabapentin  300mg  in the AM and 600mg  at bedtime.  No new weakness, imbalance or falls.  She continues to receive IVIG every 28 days and she feels that it helps.     UPDATE 02/09/2023:  She is here for follow-up visit.  She was off IVIG for 2 months due to insurance approval.  Since being off therapy, she feels that her hands feel like steel wool. She is taking gabapentin  600mg  twice daily which helps control her pain.  She has difficulty with fine motor tasks and unable to perform hobbies like sewing or crochet. She denies falls or imbalance. She was finally approved and will get restarting IVIG later this month.   UPDATE 12/09/2023:  She is here for follow-up visit.  She continues to receive IVIG every 4 weeks, but notices worsening symptoms towards the end of her cycle.  She did find that duloxetine  helps which was started at her last visit and would like to increase the dose.  She takes gabapentin  1200mg /d; typically she takes gabapentin  600mg  twice daily during the weekdays and 300mg  in the morning and 900mg  at bedtime on the weekend. She helps to take care of her 7 grandchildren and her daughter who lives with her.  Medications:  Current Outpatient Medications on File Prior to Visit  Medication Sig Dispense Refill   amLODipine  (NORVASC ) 5 MG tablet Take 1 tablet (5 mg total) by mouth daily. 90 tablet 0   atorvastatin  (LIPITOR) 40 MG tablet Take 1 tablet (40 mg total) by mouth daily. 90 tablet 0   DULoxetine  (CYMBALTA ) 30 MG capsule Take 1 capsule (30 mg total) by mouth daily. 90 capsule 3   gabapentin  (NEURONTIN ) 300 MG capsule Take 1 capsule by mouth  in the morning and take 3 capsules at bedtime. (Patient taking differently: Sunday-Thursday: Take 2 capsules in the morning and 2 capsules at bedtime. Friday and Saturday: Take 1 capsule by mouth in the morning and take 3 capsules at bedtime.) 360 capsule 3   GAMMAGARD 20 GM/200ML SOLN      GAMMAGARD 5 GM/50ML SOLN      rivaroxaban  (XARELTO ) 20 MG TABS tablet Take 1 tablet (20 mg total) by mouth daily. 30 tablet 11   traMADol  (ULTRAM ) 50 MG tablet Take 1 tablet (50 mg total) by mouth every 8 (eight) hours as needed. 15 tablet 0   No current facility-administered medications on file prior to visit.    Allergies: No Known Allergies  Vital Signs:  BP 135/80   Pulse (!) 112   Ht 5' 2 (1.575 m)   Wt 180 lb (81.6 kg)   SpO2 100%   BMI 32.92 kg/m   Neurological Exam: MENTAL STATUS including orientation to time, place, person, recent and remote memory, attention span and concentration, language, and fund of knowledge is normal.  Speech is not dysarthric.  CRANIAL NERVES:  Pupils equal round and reactive to light.  Normal conjugate, extra-ocular eye movements in all directions of gaze.  No ptosis.   Face is symmetric.   MOTOR:  Motor strength is 5/5 in all extremities, except intrinsic hand muscles 5-/5.  No atrophy, fasciculations or abnormal movements.  No pronator drift.  Tone is normal.    MSRs:  Reflexes are 2+/4 throughout  SENSORY:  Reduced sensation to temperature over the palmer surface bilaterally. Vibration is intact throughout  COORDINATION/GAIT:   Gait narrow based and stable.    Data:  NCS/EMG of the upper extremities 10/02/2022: The electrophysiologic findings continue to show a severe active on chronic polyradiculoneuropathy affecting the upper extremities, with mild progression compared to prior study on 10/18/2019.  NCS/EMG of the arms 10/15/2017:  The electrophysiologic findings are most consistent with a subacute sensorimotor polyneuropathy, axon loss and  demyelinating in type, affecting the upper extremities; these findings are moderate in degree electrically.  NCS/EMG of the legs 10/29/2017:  Normal  CSF 11/10/2017:  R1 W1 G75 P85*    IgG 0.64, MBP < 2, OCB + CSF and serum, cytology negative, ACE 4  Labs 10/29/2017:  ESR 72*, CRP <0.3, vitamin B12 434, vitamin B1 7*, copper  153, folate 15.6, MMA 122, ANA 1:80 (speckled), SPEP with IFE no M protein, ACE 77*, heavy metal screen  Labs 01/19/2019:  Vitamin B1 8  MRI cervical spine 01/20/2018: 1. Stable mild right foraminal narrowing at C6-7. 2. Slight progression of uncovertebral disease and mild right foraminal narrowing at C5-6. 3. No focal cord signal abnormality or thickening of nerve roots.      Thank you for allowing me to participate in patient's care.  If I can answer any additional questions, I would be pleased to do so.    Sincerely,    Curran Lenderman K. Tobie, DO

## 2023-12-09 NOTE — Patient Instructions (Signed)
 Increase duloxetine  to 30mg  twice daily  Continue gabapentin   Continue IVIG

## 2023-12-15 ENCOUNTER — Ambulatory Visit: Payer: 59 | Attending: Internal Medicine | Admitting: Internal Medicine

## 2023-12-15 ENCOUNTER — Other Ambulatory Visit (HOSPITAL_COMMUNITY): Payer: Self-pay

## 2023-12-15 ENCOUNTER — Encounter: Payer: Self-pay | Admitting: Internal Medicine

## 2023-12-15 VITALS — BP 129/76 | HR 101 | Temp 97.7°F | Ht 62.0 in | Wt 181.0 lb

## 2023-12-15 DIAGNOSIS — I10 Essential (primary) hypertension: Secondary | ICD-10-CM

## 2023-12-15 DIAGNOSIS — E782 Mixed hyperlipidemia: Secondary | ICD-10-CM

## 2023-12-15 DIAGNOSIS — G6181 Chronic inflammatory demyelinating polyneuritis: Secondary | ICD-10-CM | POA: Diagnosis not present

## 2023-12-15 DIAGNOSIS — Z1231 Encounter for screening mammogram for malignant neoplasm of breast: Secondary | ICD-10-CM

## 2023-12-15 DIAGNOSIS — Z780 Asymptomatic menopausal state: Secondary | ICD-10-CM

## 2023-12-15 DIAGNOSIS — N182 Chronic kidney disease, stage 2 (mild): Secondary | ICD-10-CM | POA: Diagnosis not present

## 2023-12-15 DIAGNOSIS — M1712 Unilateral primary osteoarthritis, left knee: Secondary | ICD-10-CM

## 2023-12-15 MED ORDER — ATORVASTATIN CALCIUM 40 MG PO TABS
40.0000 mg | ORAL_TABLET | Freq: Every day | ORAL | 0 refills | Status: DC
  Filled 2023-12-15 – 2024-02-18 (×2): qty 90, 90d supply, fill #0

## 2023-12-15 MED ORDER — AMLODIPINE BESYLATE 5 MG PO TABS
5.0000 mg | ORAL_TABLET | Freq: Every day | ORAL | 0 refills | Status: DC
  Filled 2023-12-15 – 2024-01-15 (×2): qty 90, 90d supply, fill #0

## 2023-12-15 NOTE — Patient Instructions (Signed)
Stop ibuprofen.  Use Tylenol arthritis when needed for arthritis pain.

## 2023-12-15 NOTE — Progress Notes (Signed)
Patient ID: Tammy [name], female    DOB: 05-29-1956  MRN: [id number]  CC: Hypertension (HTN f/u. Med refill. /No questions / concerns/No to shingles vax. Yes to mammogram)   Subjective: Tammy [name] is a 68 y.o. female who presents for chronic ds management. 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 CKD 3   CIPD: Saw her neurologist Dr. Allena Katz earlier this month.  She was continued on IVIG once a month, Cymbalta increased to 30 mg twice a day and gabapentin remained at 300mg  a.m and 900 mg in p.m day.  History of DVT: Saw Dr. Bertis Ruddy in December.  Patient deemed to be stable on Xarelto.  Plan is to continue lifelong.  Denies any bruising or bleeding on the Xarelto.  HTN: Left on amlodipine 5 mg daily.  Taking consistently.  Checks BP every other day; does not have log with her but reports readings have not been high. Limit salt in the foods.  HL: Reports compliance with atorvastatin 20 mg daily.  CKD: GFR had been in the 50s.  However last GFR done about a year ago was 66.  Saw Dr. Roda Shutters 09/2023 for LT knee pain/OA. Given Steroid taper; did not want knee inj.  Reports steroid helped a lot.  Takes one Ibuprofen about 5x/mth  HM: Due for mammogram and DEXA scan.  Also needs Medicare wellness visit (got Medicare 10/2022)  Patient Active Problem List   Diagnosis Date Noted   Thrombophilia (HCC) 01/27/2022   Preventive measure 10/05/2020   CKD (chronic kidney disease) stage 3, GFR 30-59 ml/min (HCC) 10/06/2019   Essential hypertension 03/14/2019   Hyperlipidemia 03/14/2019   Precordial chest pain 03/14/2019   Former tobacco use 02/08/2019   CIDP (chronic inflammatory demyelinating polyneuropathy) (HCC) 01/12/2018   Positive ANA (antinuclear antibody) 12/24/2015   Generalized anxiety disorder 12/24/2015   Dyslipidemia 10/09/2014   Cervical spondylolysis 10/09/2014    DVT (deep venous thrombosis) (HCC) 08/22/2013   LGSIL (low grade squamous intraepithelial lesion) on Pap smear 08/04/2013   Sleep apnea 05/21/2011     Current Outpatient Medications on File Prior to Visit  Medication Sig Dispense Refill   amLODipine (NORVASC) 5 MG tablet Take 1 tablet (5 mg total) by mouth daily. 90 tablet 0   atorvastatin (LIPITOR) 40 MG tablet Take 1 tablet (40 mg total) by mouth daily. 90 tablet 0   DULoxetine (CYMBALTA) 30 MG capsule Take 1 capsule (30 mg total) by mouth 2 (two) times daily. 180 capsule 3   gabapentin (NEURONTIN) 300 MG capsule Take 1 capsule (300 mg total) by mouth in the morning AND 3 capsules (900 mg total) at bedtime. 360 capsule 3   GAMMAGARD 20 GM/200ML SOLN      GAMMAGARD 5 GM/50ML SOLN      rivaroxaban (XARELTO) 20 MG TABS tablet Take 1 tablet (20 mg total) by mouth daily. 30 tablet 11   traMADol (ULTRAM) 50 MG tablet Take 1 tablet (50 mg total) by mouth every 8 (eight) hours as needed. 15 tablet 0   No current facility-administered medications on file prior to visit.    No Known Allergies  Social History   Socioeconomic History   Marital status: Single    Spouse name: Not on file   Number of children: 4   Years of education: 12   Highest education level: Not on file  Occupational History  Occupation: unemployed  Tobacco Use   Smoking status: Former    Current packs/day: 0.00    Types: Cigarettes   Smokeless tobacco: Never  Vaping Use   Vaping status: Never Used  Substance and Sexual Activity   Alcohol use: No   Drug use: No   Sexual activity: Yes  Other Topics Concern   Not on file  Social History Narrative   Lives alone in a one story home.  Has 4 children. Retired.  Education: GED   Recent travel to [place] 01/2011   Right handed   Social Drivers of Health   Financial Resource Strain: Not on file  Food Insecurity: Not on file  Transportation Needs: Not on file  Physical Activity: Sufficiently Active  (10/01/2017)   Exercise Vital Sign    Days of Exercise per Week: 7 days    Minutes of Exercise per Session: 110 min  Stress: Stress Concern Present (10/01/2017)   [employer] of Occupational Health - Occupational Stress Questionnaire    Feeling of Stress : Very much  Social Connections: Moderately Isolated (10/01/2017)   Social Connection and Isolation Panel [NHANES]    Frequency of Communication with Friends and Family: More than three times a week    Frequency of Social Gatherings with Friends and Family: More than three times a week    Attends Religious Services: Never    Database administrator or Organizations: No    Attends Banker Meetings: Never    Marital Status: Divorced  Catering manager Violence: Not At Risk (10/01/2017)   Humiliation, Afraid, Rape, and Kick questionnaire    Fear of Current or Ex-Partner: No    Emotionally Abused: No    Physically Abused: No    Sexually Abused: No    Family History  Problem Relation Age of Onset   Emphysema Father    Breast cancer Mother    Breast cancer Maternal Grandmother    Heart disease Sister    Cancer Sister    Diabetes Brother     Past Surgical History:  Procedure Laterality Date   CESAREAN SECTION     one previous   right arm fracture     TUBAL LIGATION      ROS: Review of Systems Negative except as stated above  PHYSICAL EXAM: BP 129/76 (BP Location: Left Arm, Patient Position: Sitting, Cuff Size: Large)   Pulse (!) 101   Temp 97.7 F (36.5 C) (Oral)   Ht 5\' 2"  (1.575 m)   Wt 181 lb (82.1 kg)   SpO2 98%   BMI 33.11 kg/m   Wt Readings from Last 3 Encounters:  12/15/23 181 lb (82.1 kg)  12/09/23 180 lb (81.6 kg)  10/15/23 181 lb 12.8 oz (82.5 kg)    Physical Exam  General appearance - alert, well appearing, older AAF and in no distress Mental status - normal mood, behavior, speech, dress, motor activity, and thought processes Neck - supple, no significant adenopathy Chest - clear  to auscultation, no wheezes, rales or rhonchi, symmetric air entry Heart - normal rate, regular rhythm, normal S1, S2, no murmurs, rubs, clicks or gallops Musculoskeletal - LT knee: Mild joint enlargement.  Mild crepitus on passive range of motion. Extremities - peripheral pulses normal, no pedal edema, no clubbing or cyanosis      Latest Ref Rng & Units 01/05/2023    4:28 PM 05/29/2022    9:25 AM 10/07/2021    9:20 AM  CMP  Glucose 70 - 99 mg/dL  86  114  125   BUN 8 - 27 mg/dL 11  10  9    Creatinine 0.57 - 1.00 mg/dL 5.78  4.69  6.29   Sodium 134 - 144 mmol/L 142  143  138   Potassium 3.5 - 5.2 mmol/L 4.4  4.7  3.9   Chloride 96 - 106 mmol/L 104  106  105   CO2 20 - 29 mmol/L 23  23  24    Calcium 8.7 - 10.3 mg/dL 9.8  9.4  9.2   Total Protein 6.0 - 8.5 g/dL  7.3    Total Bilirubin 0.0 - 1.2 mg/dL  0.6    Alkaline Phos 44 - 121 IU/L  120    AST 0 - 40 IU/L  17    ALT 0 - 32 IU/L  12     Lipid Panel     Component Value Date/Time   CHOL 181 05/29/2022 0925   TRIG 211 (H) 05/29/2022 0925   HDL 36 (L) 05/29/2022 0925   CHOLHDL 5.0 (H) 05/29/2022 0925   CHOLHDL 7.2 (H) 12/24/2015 1109   VLDL 74 (H) 12/24/2015 1109   LDLCALC 108 (H) 05/29/2022 0925    CBC    Component Value Date/Time   WBC 8.5 10/15/2023 1045   RBC 4.17 10/15/2023 1045   HGB 12.7 10/15/2023 1045   HGB 12.6 05/29/2022 0925   HGB 12.3 08/11/2013 1046   HCT 39.0 10/15/2023 1045   HCT 39.3 05/29/2022 0925   HCT 37.0 08/11/2013 1046   PLT 387 10/15/2023 1045   PLT 307 05/29/2022 0925   MCV 93.5 10/15/2023 1045   MCV 92 05/29/2022 0925   MCV 91.2 08/11/2013 1046   MCH 30.5 10/15/2023 1045   MCHC 32.6 10/15/2023 1045   RDW 13.5 10/15/2023 1045   RDW 12.5 05/29/2022 0925   RDW 13.6 08/11/2013 1046   LYMPHSABS 2.8 10/15/2023 1045   LYMPHSABS 2.4 05/13/2017 0956   LYMPHSABS 1.7 08/11/2013 1046   MONOABS 0.6 10/15/2023 1045   MONOABS 0.6 08/11/2013 1046   EOSABS 0.1 10/15/2023 1045   EOSABS 0.2 05/13/2017  0956   BASOSABS 0.1 10/15/2023 1045   BASOSABS 0.0 05/13/2017 0956   BASOSABS 0.1 08/11/2013 1046    ASSESSMENT AND PLAN:  1. Essential hypertension (Primary) At goal.  Continue Norvasc - amLODipine (NORVASC) 5 MG tablet; Take 1 tablet (5 mg total) by mouth daily.  Dispense: 90 tablet; Refill: 0 - Comprehensive metabolic panel - Lipid panel  2. Mixed hyperlipidemia Continue Lipitor - atorvastatin (LIPITOR) 40 MG tablet; Take 1 tablet (40 mg total) by mouth daily.  Dispense: 90 tablet; Refill: 0  3. CIDP (chronic inflammatory demyelinating polyneuropathy) (HCC) Stable. Being management by neurology  4. CKD (chronic kidney disease) stage 2, GFR 60-89 ml/min GFR improved. Recheck chem today. Advised to avoid NSAIDs.  Use Tylenol arthritis instead for arthritis pain.  5. Primary osteoarthritis of left knee See #4 aboce  6. Encounter for screening mammogram for malignant neoplasm of breast - MM Digital Screening; Future  7. Postmenopausal estrogen deficiency - DG Bone Density; Future  Patient was given the opportunity to ask questions.  Patient verbalized understanding of the plan and was able to repeat key elements of the plan.   This documentation was completed using Paediatric nurse.  Any transcriptional errors are unintentional.  No orders of the defined types were placed in this encounter.    Requested Prescriptions   Pending Prescriptions Disp Refills   amLODipine (NORVASC) 5  MG tablet 90 tablet 0    Sig: Take 1 tablet (5 mg total) by mouth daily.   atorvastatin (LIPITOR) 40 MG tablet 90 tablet 0    Sig: Take 1 tablet (40 mg total) by mouth daily.   traMADol (ULTRAM) 50 MG tablet 15 tablet 0    Sig: Take 1 tablet (50 mg total) by mouth every 8 (eight) hours as needed.    No follow-ups on file.  Jonah Blue, MD, FACP

## 2023-12-16 LAB — COMPREHENSIVE METABOLIC PANEL
ALT: 12 [IU]/L (ref 0–32)
AST: 16 [IU]/L (ref 0–40)
Albumin: 4 g/dL (ref 3.9–4.9)
Alkaline Phosphatase: 139 [IU]/L — ABNORMAL HIGH (ref 44–121)
BUN/Creatinine Ratio: 8 — ABNORMAL LOW (ref 12–28)
BUN: 9 mg/dL (ref 8–27)
Bilirubin Total: 0.5 mg/dL (ref 0.0–1.2)
CO2: 23 mmol/L (ref 20–29)
Calcium: 9.6 mg/dL (ref 8.7–10.3)
Chloride: 104 mmol/L (ref 96–106)
Creatinine, Ser: 1.06 mg/dL — ABNORMAL HIGH (ref 0.57–1.00)
Globulin, Total: 3.2 g/dL (ref 1.5–4.5)
Glucose: 105 mg/dL — ABNORMAL HIGH (ref 70–99)
Potassium: 4.4 mmol/L (ref 3.5–5.2)
Sodium: 142 mmol/L (ref 134–144)
Total Protein: 7.2 g/dL (ref 6.0–8.5)
eGFR: 58 mL/min/{1.73_m2} — ABNORMAL LOW (ref >59)

## 2023-12-16 LAB — LIPID PANEL
Chol/HDL Ratio: 4.2 {ratio} (ref 0.0–4.4)
Cholesterol, Total: 166 mg/dL (ref 100–199)
HDL: 40 mg/dL (ref >39)
LDL Chol Calc (NIH): 99 mg/dL (ref 0–99)
Triglycerides: 155 mg/dL — ABNORMAL HIGH (ref 0–149)
VLDL Cholesterol Cal: 27 mg/dL (ref 5–40)

## 2024-01-12 ENCOUNTER — Ambulatory Visit: Payer: 59

## 2024-01-15 ENCOUNTER — Other Ambulatory Visit (HOSPITAL_COMMUNITY): Payer: Self-pay

## 2024-01-15 ENCOUNTER — Other Ambulatory Visit: Payer: Self-pay

## 2024-02-18 ENCOUNTER — Other Ambulatory Visit (HOSPITAL_COMMUNITY): Payer: Self-pay

## 2024-02-18 ENCOUNTER — Other Ambulatory Visit: Payer: Self-pay

## 2024-02-26 ENCOUNTER — Ambulatory Visit

## 2024-03-10 ENCOUNTER — Encounter (HOSPITAL_COMMUNITY): Payer: Self-pay

## 2024-03-17 ENCOUNTER — Ambulatory Visit
Admission: RE | Admit: 2024-03-17 | Discharge: 2024-03-17 | Disposition: A | Discharge status: Home / Self Care | Source: Ambulatory Visit | Attending: Internal Medicine | Admitting: Internal Medicine

## 2024-03-17 ENCOUNTER — Other Ambulatory Visit (HOSPITAL_COMMUNITY): Payer: Self-pay

## 2024-03-17 ENCOUNTER — Other Ambulatory Visit: Payer: Self-pay

## 2024-03-17 DIAGNOSIS — Z1231 Encounter for screening mammogram for malignant neoplasm of breast: Secondary | ICD-10-CM

## 2024-03-22 ENCOUNTER — Ambulatory Visit: Payer: Self-pay | Admitting: Internal Medicine

## 2024-04-15 ENCOUNTER — Ambulatory Visit: Payer: 59 | Admitting: Internal Medicine

## 2024-04-19 ENCOUNTER — Ambulatory Visit: Payer: 59 | Attending: Internal Medicine

## 2024-04-19 VITALS — BP 129/76 | Ht 62.0 in | Wt 189.0 lb

## 2024-04-19 DIAGNOSIS — Z0000 Encounter for general adult medical examination without abnormal findings: Secondary | ICD-10-CM

## 2024-04-19 NOTE — Patient Instructions (Signed)
 Tammy [name] , Thank you for taking time out of your busy schedule to complete your Annual Wellness Visit with me. I enjoyed our conversation and look forward to speaking with you again next year. I, as well as your care team,  appreciate your ongoing commitment to your health goals. Please review the following plan we discussed and let me know if I can assist you in the future. Your Game plan/ To Do List    Referrals: If you haven't heard from the office you've been referred to, please reach out to them at the phone provided.   Follow up Visits: Next Medicare AWV with our clinical staff: patient decclined   Have you seen your provider in the last 6 months (3 months if uncontrolled diabetes)? No Next Office Visit with your provider:   Clinician Recommendations:  Aim for 30 minutes of exercise or brisk walking, 6-8 glasses of water, and 5 servings of fruits and vegetables each day.       This is a list of the screening recommended for you and due dates:  Health Maintenance  Topic Date Due   Pneumococcal Vaccine for age over 3 (1 of 2 - PCV) Never done   Zoster (Shingles) Vaccine (1 of 2) Never done   DEXA scan (bone density measurement)  Never done   COVID-19 Vaccine (1 - 2024-25 season) Never done   Flu Shot  06/03/2024   Colon Cancer Screening  03/07/2025   Medicare Annual Wellness Visit  04/19/2025   Mammogram  03/17/2026   DTaP/Tdap/Td vaccine (2 - Td or Tdap) 05/14/2027   Hepatitis C Screening  Completed   HPV Vaccine  Aged Out   Meningitis B Vaccine  Aged Out    Advanced directives: (Declined) Advance directive discussed with you today. Even though you declined this today, please call our office should you change your mind, and we can give you the proper paperwork for you to fill out. Advance Care Planning is important because it:  [x]  Makes sure you receive the medical care that is consistent with your values, goals, and preferences  [x]  It provides guidance to your family and  loved ones and reduces their decisional burden about whether or not they are making the right decisions based on your wishes.  Follow the link provided in your after visit summary or read over the paperwork we have mailed to you to help you started getting your Advance Directives in place. If you need assistance in completing these, please reach out to us  so that we can help you!  See attachments for Preventive Care and Fall Prevention Tips.

## 2024-04-19 NOTE — Progress Notes (Signed)
 Because this visit was a virtual/telehealth visit,  certain criteria was not obtained, such a blood pressure, CBG if applicable, and timed get up and go. Any medications not marked as taking were not mentioned during the medication reconciliation part of the visit. Any vitals not documented were not able to be obtained due to this being a telehealth visit or patient was unable to self-report a recent blood pressure reading due to a lack of equipment at home via telehealth. Vitals that have been documented are verbally provided by the patient.   This visit was performed by a medical professional under my direct supervision. I was immediately available for consultation/collaboration. I have reviewed and agree with the Annual Wellness Visit documentation.  Subjective:   Tammy [name] is a 68 y.o. who presents for a Medicare Wellness preventive visit.  As a reminder, Annual Wellness Visits don't include a physical exam, and some assessments may be limited, especially if this visit is performed virtually. We may recommend an in-person follow-up visit with your provider if needed.  Visit Complete: Virtual I connected with  Tammy [name] on 04/19/24 by a audio enabled telemedicine application and verified that I am speaking with the correct person using two identifiers.  Patient Location: Home  Provider Location: Home Office  I discussed the limitations of evaluation and management by telemedicine. The patient expressed understanding and agreed to proceed.  Vital Signs: Because this visit was a virtual/telehealth visit, some criteria may be missing or patient reported. Any vitals not documented were not able to be obtained and vitals that have been documented are patient reported.  VideoDeclined- This patient declined Librarian, academic. Therefore the visit was completed with audio only.  Persons Participating in Visit: Patient.  AWV Questionnaire: No: Patient  Medicare AWV questionnaire was not completed prior to this visit.  Cardiac Risk Factors include: advanced age (>65men, >44 women);obesity (BMI >30kg/m2);dyslipidemia     Objective:    Today's Vitals   04/19/24 1502  BP: 129/76  Weight: 189 lb (85.7 kg)  Height: 5' 2 (1.575 m)   Body mass index is 34.57 kg/m.     04/19/2024    3:02 PM 12/09/2023    8:53 AM 07/11/2023    8:48 PM 02/09/2023   10:34 AM 09/03/2022    9:50 AM 10/24/2021    2:35 PM 04/25/2021    7:56 AM  Advanced Directives  Does Patient Have a Medical Advance Directive? No No No Yes No No No  Type of Theme park manager;Living will;Out of facility DNR (pink MOST or yellow form)     Would patient like information on creating a medical advance directive? No - Patient declined  No - Patient declined        Current Medications (verified) Outpatient Encounter Medications as of 04/19/2024  Medication Sig   amLODipine  (NORVASC ) 5 MG tablet Take 1 tablet (5 mg total) by mouth daily.   atorvastatin  (LIPITOR) 40 MG tablet Take 1 tablet (40 mg total) by mouth daily.   DULoxetine  (CYMBALTA ) 30 MG capsule Take 1 capsule (30 mg total) by mouth 2 (two) times daily.   gabapentin  (NEURONTIN ) 300 MG capsule Take 1 capsule (300 mg total) by mouth in the morning AND 3 capsules (900 mg total) at bedtime.   GAMMAGARD 20 GM/200ML SOLN    GAMMAGARD 5 GM/50ML SOLN    rivaroxaban  (XARELTO ) 20 MG TABS tablet Take 1 tablet (20 mg total) by mouth daily.  traMADol  (ULTRAM ) 50 MG tablet Take 1 tablet (50 mg total) by mouth every 8 (eight) hours as needed.   No facility-administered encounter medications on file as of 04/19/2024.    Allergies (verified) Patient has no known allergies.   History: Past Medical History:  Diagnosis Date   Chronic headaches    DVT (deep venous thrombosis) (HCC)    Leukemia (HCC)    Told she had it at age 73, given some shots   Pulmonary embolism (HCC)    Seasonal allergies     Past Surgical History:  Procedure Laterality Date   CESAREAN SECTION     one previous   right arm fracture     TUBAL LIGATION     Family History  Problem Relation Age of Onset   Emphysema Father    Breast cancer Mother    Breast cancer Maternal Grandmother    Heart disease Sister    Cancer Sister    Diabetes Brother    Social History   Socioeconomic History   Marital status: Single    Spouse name: Not on file   Number of children: 4   Years of education: 12   Highest education level: Not on file  Occupational History   Occupation: unemployed  Tobacco Use   Smoking status: Former    Current packs/day: 0.00    Types: Cigarettes   Smokeless tobacco: Never  Vaping Use   Vaping status: Never Used  Substance and Sexual Activity   Alcohol use: No   Drug use: No   Sexual activity: Yes  Other Topics Concern   Not on file  Social History Narrative   Lives alone in a one story home.  Has 4 children. Retired.  Education: GED   Recent travel to [place]  01/2011   Right handed   Social Drivers of Health   Financial Resource Strain: Low Risk  (04/19/2024)   Overall Financial Resource Strain (CARDIA)    Difficulty of Paying Living Expenses: Not hard at all  Food Insecurity: No Food Insecurity (04/19/2024)   Hunger Vital Sign    Worried About Running Out of Food in the Last Year: Never true    Ran Out of Food in the Last Year: Never true  Transportation Needs: No Transportation Needs (04/19/2024)   PRAPARE - Administrator, Civil Service (Medical): No    Lack of Transportation (Non-Medical): No  Physical Activity: Sufficiently Active (04/19/2024)   Exercise Vital Sign    Days of Exercise per Week: 7 days    Minutes of Exercise per Session: 110 min  Stress: Stress Concern Present (04/19/2024)   [employer] of Occupational Health - Occupational Stress Questionnaire    Feeling of Stress: Very much  Social Connections: Socially Isolated (04/19/2024)    Social Connection and Isolation Panel    Frequency of Communication with Friends and Family: More than three times a week    Frequency of Social Gatherings with Friends and Family: More than three times a week    Attends Religious Services: Never    Database administrator or Organizations: No    Attends Engineer, structural: Never    Marital Status: Divorced    Tobacco Counseling Counseling given: Not Answered    Clinical Intake:  Pre-visit preparation completed: Yes  Pain : No/denies pain     BMI - recorded: 34.57 Nutritional Status: BMI > 30  Obese Nutritional Risks: None Diabetes: No  Lab Results  Component Value Date   HGBA1C  5.3 05/29/2022   HGBA1C 5.5 07/04/2020   HGBA1C 5.7 05/13/2017     How often do you need to have someone help you when you read instructions, pamphlets, or other written materials from your doctor or pharmacy?: 1 - Never  Interpreter Needed?: No  Information entered by :: Juliann Ochoa   Activities of Daily Living     04/19/2024    3:05 PM  In your present state of health, do you have any difficulty performing the following activities:  Hearing? 0  Vision? 0  Difficulty concentrating or making decisions? 0  Walking or climbing stairs? 0  Dressing or bathing? 0  Doing errands, shopping? 0  Preparing Food and eating ? N  Using the Toilet? N  In the past six months, have you accidently leaked urine? N  Do you have problems with loss of bowel control? N  Managing your Medications? N  Managing your Finances? N  Housekeeping or managing your Housekeeping? N    Patient Care Team: Lawrance Presume, MD as PCP - General (Internal Medicine) Sheryle Donning, MD as PCP - Cardiology (Cardiology) Almeda Jacobs, MD as Consulting Physician (Hematology and Oncology) Patel, Donika K, DO as Consulting Physician (Neurology)  I have updated your Care Teams any recent Medical Services you may have received from other  providers in the past year.     Assessment:   This is a routine wellness examination for Felice.  Hearing/Vision screen Hearing Screening - Comments:: No hearing issues  Vision Screening - Comments:: Patient wears glasses    Goals Addressed             This Visit's Progress    Patient Stated       Patient would like to lose weight        Depression Screen     04/19/2024    3:06 PM 12/15/2023   10:04 AM 02/18/2023    9:29 AM 01/05/2023    3:38 PM 05/29/2022    8:43 AM 01/27/2022    8:24 AM 06/20/2021    3:06 PM  PHQ 2/9 Scores  PHQ - 2 Score 2  0   0 1  PHQ- 9 Score 3        Exception Documentation  Patient refusal  Patient refusal Patient refusal      Fall Risk     04/19/2024    3:04 PM 12/09/2023    8:53 AM 09/09/2023    9:59 AM 02/18/2023    9:29 AM 02/09/2023   10:33 AM  Fall Risk   Falls in the past year? 0 0 0 0 0  Number falls in past yr: 0 0 0 0 0  Injury with Fall? 0 0 0 0 0  Risk for fall due to : No Fall Risks  No Fall Risks No Fall Risks   Follow up Falls evaluation completed Falls evaluation completed   Falls evaluation completed    MEDICARE RISK AT HOME:  Medicare Risk at Home Any stairs in or around the home?: Yes If so, are there any without handrails?: No Home free of loose throw rugs in walkways, pet beds, electrical cords, etc?: Yes Adequate lighting in your home to reduce risk of falls?: Yes Life alert?: No Use of a cane, walker or w/c?: Yes (cane) Grab bars in the bathroom?: No Shower chair or bench in shower?: No Elevated toilet seat or a handicapped toilet?: No  TIMED UP AND GO:  Was the test performed?  No  Cognitive  Function: 6CIT completed        04/19/2024    3:04 PM  6CIT Screen  What Year? 0 points  What month? 0 points  What time? 0 points  Count back from 20 0 points  Months in reverse 0 points  Repeat phrase 0 points  Total Score 0 points    Immunizations Immunization History  Administered Date(s) Administered    Tdap 05/13/2017    Screening Tests Health Maintenance  Topic Date Due   Pneumococcal Vaccine: 50+ Years (1 of 2 - PCV) Never done   Zoster Vaccines- Shingrix (1 of 2) Never done   DEXA SCAN  Never done   COVID-19 Vaccine (1 - 2024-25 season) Never done   INFLUENZA VACCINE  06/03/2024   Colonoscopy  03/07/2025   Medicare Annual Wellness (AWV)  04/19/2025   MAMMOGRAM  03/17/2026   DTaP/Tdap/Td (2 - Td or Tdap) 05/14/2027   Hepatitis C Screening  Completed   HPV VACCINES  Aged Out   Meningococcal B Vaccine  Aged Out    Health Maintenance  Health Maintenance Due  Topic Date Due   Pneumococcal Vaccine: 50+ Years (1 of 2 - PCV) Never done   Zoster Vaccines- Shingrix (1 of 2) Never done   DEXA SCAN  Never done   COVID-19 Vaccine (1 - 2024-25 season) Never done   Health Maintenance Items Addressed:   Additional Screening:  Vision Screening: Recommended annual ophthalmology exams for early detection of glaucoma and other disorders of the eye. Would you like a referral to an eye doctor? No    Dental Screening: Recommended annual dental exams for proper oral hygiene  Community Resource Referral / Chronic Care Management: CRR required this visit?  No   CCM required this visit?  No   Plan:    I have personally reviewed and noted the following in the patient's chart:   Medical and social history Use of alcohol, tobacco or illicit drugs  Current medications and supplements including opioid prescriptions. Patient is not currently taking opioid prescriptions. Functional ability and status Nutritional status Physical activity Advanced directives List of other physicians Hospitalizations, surgeries, and ER visits in previous 12 months Vitals Screenings to include cognitive, depression, and falls Referrals and appointments  In addition, I have reviewed and discussed with patient certain preventive protocols, quality metrics, and best practice recommendations. A written  personalized care plan for preventive services as well as general preventive health recommendations were provided to patient.   Freeda Jerry, [place]   04/19/2024   After Visit Summary: (MyChart) Due to this being a telephonic visit, the after visit summary with patients personalized plan was offered to patient via MyChart   Notes: Nothing significant to report at this time.

## 2024-04-21 ENCOUNTER — Other Ambulatory Visit (HOSPITAL_COMMUNITY): Payer: Self-pay

## 2024-04-21 ENCOUNTER — Other Ambulatory Visit: Payer: Self-pay

## 2024-04-21 ENCOUNTER — Other Ambulatory Visit: Payer: Self-pay | Admitting: Internal Medicine

## 2024-04-21 DIAGNOSIS — I10 Essential (primary) hypertension: Secondary | ICD-10-CM

## 2024-04-21 MED ORDER — AMLODIPINE BESYLATE 5 MG PO TABS
5.0000 mg | ORAL_TABLET | Freq: Every day | ORAL | 0 refills | Status: DC
  Filled 2024-04-21: qty 90, 90d supply, fill #0

## 2024-05-24 ENCOUNTER — Other Ambulatory Visit: Payer: Self-pay | Admitting: Internal Medicine

## 2024-05-24 ENCOUNTER — Other Ambulatory Visit (HOSPITAL_COMMUNITY): Payer: Self-pay

## 2024-05-24 DIAGNOSIS — E782 Mixed hyperlipidemia: Secondary | ICD-10-CM

## 2024-05-24 MED ORDER — ATORVASTATIN CALCIUM 40 MG PO TABS
40.0000 mg | ORAL_TABLET | Freq: Every day | ORAL | 0 refills | Status: DC
  Filled 2024-05-24: qty 90, 90d supply, fill #0

## 2024-06-07 ENCOUNTER — Encounter: Payer: Self-pay | Admitting: Neurology

## 2024-06-20 ENCOUNTER — Other Ambulatory Visit (HOSPITAL_COMMUNITY): Payer: Self-pay

## 2024-07-01 ENCOUNTER — Encounter

## 2024-07-01 ENCOUNTER — Inpatient Hospital Stay: Admit: 2024-07-01 | Payer: Medicare (Managed Care) | Primary: Family Medicine

## 2024-07-01 DIAGNOSIS — M47814 Spondylosis without myelopathy or radiculopathy, thoracic region: Principal | ICD-10-CM

## 2024-07-25 ENCOUNTER — Other Ambulatory Visit: Payer: Self-pay | Admitting: Internal Medicine

## 2024-07-25 ENCOUNTER — Other Ambulatory Visit (HOSPITAL_COMMUNITY): Payer: Self-pay

## 2024-07-25 DIAGNOSIS — I10 Essential (primary) hypertension: Secondary | ICD-10-CM

## 2024-07-26 ENCOUNTER — Other Ambulatory Visit (HOSPITAL_COMMUNITY): Payer: Self-pay

## 2024-07-26 MED ORDER — AMLODIPINE BESYLATE 5 MG PO TABS
5.0000 mg | ORAL_TABLET | Freq: Every day | ORAL | 0 refills | Status: DC
  Filled 2024-07-26 (×2): qty 90, 90d supply, fill #0

## 2024-07-26 NOTE — Telephone Encounter (Signed)
 Requested Prescriptions  Pending Prescriptions Disp Refills   amLODipine  (NORVASC ) 5 MG tablet 90 tablet 0    Sig: Take 1 tablet (5 mg total) by mouth daily.     Cardiovascular: Calcium  Channel Blockers 2 Failed - 07/26/2024  2:27 PM      Failed - Valid encounter within last 6 months    Recent Outpatient Visits           7 months ago Essential hypertension   [facility] [place]. [facility] Vicci Barnie NOVAK, [place]   10 months ago Left knee pain, unspecified chronicity   [facility] [place]. [facility] [place], Jon HERO, [place]   1 year ago Screening for cervical cancer   [facility] Of [place]. [facility] [place], Jon HERO, [place]   1 year ago Essential hypertension   [facility] Of [place]. [facility] Vicci Barnie NOVAK, MD   2 years ago Essential hypertension   [facility] [place] Of . [facility] Vicci Barnie B, MD              Passed - Last BP in normal range    BP Readings from Last 1 Encounters:  04/19/24 129/76         Passed - Last Heart Rate in normal range    Pulse Readings from Last 1 Encounters:  12/15/23 (!) 101

## 2024-08-25 ENCOUNTER — Other Ambulatory Visit: Payer: Self-pay | Admitting: Internal Medicine

## 2024-08-25 ENCOUNTER — Other Ambulatory Visit (HOSPITAL_COMMUNITY): Payer: Self-pay

## 2024-08-25 DIAGNOSIS — E782 Mixed hyperlipidemia: Secondary | ICD-10-CM

## 2024-08-26 ENCOUNTER — Other Ambulatory Visit (HOSPITAL_COMMUNITY): Payer: Self-pay

## 2024-08-26 MED ORDER — ATORVASTATIN CALCIUM 40 MG PO TABS
40.0000 mg | ORAL_TABLET | Freq: Every day | ORAL | 0 refills | Status: DC
  Filled 2024-08-26: qty 90, 90d supply, fill #0

## 2024-08-26 NOTE — Telephone Encounter (Signed)
 Requested Prescriptions  Pending Prescriptions Disp Refills   atorvastatin  (LIPITOR) 40 MG tablet 90 tablet 0    Sig: Take 1 tablet (40 mg total) by mouth daily.     Cardiovascular:  Antilipid - Statins Failed - 08/26/2024  3:37 PM      Failed - Lipid Panel in normal range within the last 12 months    Cholesterol, Total  Date Value Ref Range Status  12/15/2023 166 100 - 199 mg/dL Final   LDL Chol Calc (NIH)  Date Value Ref Range Status  12/15/2023 99 0 - 99 mg/dL Final   HDL  Date Value Ref Range Status  12/15/2023 40 >39 mg/dL Final   Triglycerides  Date Value Ref Range Status  12/15/2023 155 (H) 0 - 149 mg/dL Final         Passed - Patient is not pregnant      Passed - Valid encounter within last 12 months    Recent Outpatient Visits           8 months ago Essential hypertension   [facility] Of [place]. [facility] Vicci Barnie NOVAK, MD   11 months ago Left knee pain, unspecified chronicity   [facility] [place]. [facility] [place], Jon HERO, [place]   1 year ago Screening for cervical cancer   [facility] [place] [facility] Of [place]. [facility] [place], Jon HERO, [place]   1 year ago Essential hypertension   [facility] Of [place]. [facility] Vicci Barnie NOVAK, MD   2 years ago Essential hypertension   [facility] Of [place]. [facility] Vicci Barnie NOVAK, [place]

## 2024-08-31 ENCOUNTER — Encounter

## 2024-08-31 ENCOUNTER — Inpatient Hospital Stay: Admit: 2024-08-31 | Payer: Medicare (Managed Care) | Primary: Family Medicine

## 2024-08-31 DIAGNOSIS — M25562 Pain in left knee: Principal | ICD-10-CM

## 2024-09-05 ENCOUNTER — Encounter: Payer: Self-pay | Admitting: Radiology

## 2024-09-23 ENCOUNTER — Other Ambulatory Visit (HOSPITAL_COMMUNITY): Payer: Self-pay

## 2024-10-14 ENCOUNTER — Inpatient Hospital Stay: Payer: 59 | Attending: Internal Medicine

## 2024-10-14 ENCOUNTER — Ambulatory Visit: Payer: 59 | Admitting: Hematology and Oncology

## 2024-10-20 ENCOUNTER — Encounter

## 2024-10-25 ENCOUNTER — Other Ambulatory Visit: Payer: Self-pay | Admitting: Hematology and Oncology

## 2024-10-25 ENCOUNTER — Other Ambulatory Visit: Payer: Self-pay | Admitting: Internal Medicine

## 2024-10-25 ENCOUNTER — Other Ambulatory Visit (HOSPITAL_COMMUNITY): Payer: Self-pay

## 2024-10-25 DIAGNOSIS — I10 Essential (primary) hypertension: Secondary | ICD-10-CM

## 2024-10-28 ENCOUNTER — Other Ambulatory Visit (HOSPITAL_COMMUNITY): Payer: Self-pay

## 2024-10-31 ENCOUNTER — Other Ambulatory Visit: Payer: Self-pay | Admitting: Internal Medicine

## 2024-10-31 ENCOUNTER — Other Ambulatory Visit: Payer: Self-pay

## 2024-10-31 ENCOUNTER — Other Ambulatory Visit (HOSPITAL_COMMUNITY): Payer: Self-pay

## 2024-10-31 DIAGNOSIS — I10 Essential (primary) hypertension: Secondary | ICD-10-CM

## 2024-10-31 MED ORDER — AMLODIPINE BESYLATE 5 MG PO TABS
5.0000 mg | ORAL_TABLET | Freq: Every day | ORAL | 0 refills | Status: DC
  Filled 2024-10-31: qty 30, 30d supply, fill #0

## 2024-10-31 MED ORDER — RIVAROXABAN 20 MG PO TABS
20.0000 mg | ORAL_TABLET | Freq: Every day | ORAL | 11 refills | Status: DC
  Filled 2024-10-31: qty 30, 30d supply, fill #0

## 2024-11-01 ENCOUNTER — Other Ambulatory Visit (HOSPITAL_COMMUNITY): Payer: Self-pay

## 2024-11-02 ENCOUNTER — Ambulatory Visit: Payer: Medicare (Managed Care) | Primary: Family Medicine

## 2024-11-08 ENCOUNTER — Inpatient Hospital Stay: Admit: 2024-11-08 | Payer: Medicare (Managed Care) | Primary: Family Medicine

## 2024-11-08 VITALS — Ht 67.0 in | Wt 171.0 lb

## 2024-11-08 DIAGNOSIS — Z1231 Encounter for screening mammogram for malignant neoplasm of breast: Secondary | ICD-10-CM

## 2024-11-09 ENCOUNTER — Telehealth: Payer: Self-pay | Admitting: Neurology

## 2024-11-09 NOTE — Telephone Encounter (Signed)
 Doyal from [place] at [phone] called this afternoon and the prescription called Gammagard Liquid needs a prior authorization. Please fax the information to: [phone] . Diane will fax over the information about the prior authorization. Thanks

## 2024-11-10 NOTE — Telephone Encounter (Signed)
 Dianna from [employer] called and needs me to get on CoverMyMeds to submit our art for prior authorization. KEY#[id number]

## 2024-11-10 NOTE — Telephone Encounter (Signed)
 I have faxed PA information as requested.

## 2024-11-11 ENCOUNTER — Telehealth: Payer: Self-pay | Admitting: Neurology

## 2024-11-11 NOTE — Telephone Encounter (Signed)
 [employer] wants clinical answers to questions Form faxed was for different Rx need the completion for Rx Octagam. Will fax form back for completion, will deny 11/13/24 [facility]

## 2024-11-14 NOTE — Telephone Encounter (Signed)
 Gamunex is what we are trying to approve for patient.

## 2024-11-15 ENCOUNTER — Inpatient Hospital Stay: Attending: Internal Medicine

## 2024-11-15 ENCOUNTER — Inpatient Hospital Stay: Admitting: Hematology and Oncology

## 2024-11-17 ENCOUNTER — Other Ambulatory Visit (HOSPITAL_COMMUNITY): Payer: Self-pay

## 2024-11-17 ENCOUNTER — Encounter: Payer: Self-pay | Admitting: Hematology and Oncology

## 2024-11-17 ENCOUNTER — Inpatient Hospital Stay

## 2024-11-17 ENCOUNTER — Inpatient Hospital Stay: Admitting: Hematology and Oncology

## 2024-11-17 ENCOUNTER — Other Ambulatory Visit: Payer: Self-pay

## 2024-11-17 VITALS — BP 123/76 | HR 95 | Temp 98.0°F | Resp 18 | Ht 62.0 in | Wt 193.8 lb

## 2024-11-17 DIAGNOSIS — G6181 Chronic inflammatory demyelinating polyneuritis: Secondary | ICD-10-CM

## 2024-11-17 DIAGNOSIS — I825Y9 Chronic embolism and thrombosis of unspecified deep veins of unspecified proximal lower extremity: Secondary | ICD-10-CM

## 2024-11-17 DIAGNOSIS — N183 Chronic kidney disease, stage 3 unspecified: Secondary | ICD-10-CM

## 2024-11-17 LAB — CBC WITH DIFFERENTIAL/PLATELET
Abs Immature Granulocytes: 0.03 K/uL (ref 0.00–0.07)
Basophils Absolute: 0.1 K/uL (ref 0.0–0.1)
Basophils Relative: 1 %
Eosinophils Absolute: 0.1 K/uL (ref 0.0–0.5)
Eosinophils Relative: 1 %
HCT: 39.8 % (ref 36.0–46.0)
Hemoglobin: 12.9 g/dL (ref 12.0–15.0)
Immature Granulocytes: 0 %
Lymphocytes Relative: 35 %
Lymphs Abs: 2.8 K/uL (ref 0.7–4.0)
MCH: 30 pg (ref 26.0–34.0)
MCHC: 32.4 g/dL (ref 30.0–36.0)
MCV: 92.6 fL (ref 80.0–100.0)
Monocytes Absolute: 0.7 K/uL (ref 0.1–1.0)
Monocytes Relative: 9 %
Neutro Abs: 4.3 K/uL (ref 1.7–7.7)
Neutrophils Relative %: 54 %
Platelets: 355 K/uL (ref 150–400)
RBC: 4.3 MIL/uL (ref 3.87–5.11)
RDW: 12.7 % (ref 11.5–15.5)
WBC: 8 K/uL (ref 4.0–10.5)
nRBC: 0 % (ref 0.0–0.2)

## 2024-11-17 LAB — BASIC METABOLIC PANEL - CANCER CENTER ONLY
Anion gap: 10 (ref 5–15)
BUN: 9 mg/dL (ref 8–23)
CO2: 28 mmol/L (ref 22–32)
Calcium: 9.6 mg/dL (ref 8.9–10.3)
Chloride: 103 mmol/L (ref 98–111)
Creatinine: 1.12 mg/dL — ABNORMAL HIGH (ref 0.44–1.00)
GFR, Estimated: 54 mL/min — ABNORMAL LOW
Glucose, Bld: 119 mg/dL — ABNORMAL HIGH (ref 70–99)
Potassium: 4.4 mmol/L (ref 3.5–5.1)
Sodium: 140 mmol/L (ref 135–145)

## 2024-11-17 MED ORDER — RIVAROXABAN 20 MG PO TABS
20.0000 mg | ORAL_TABLET | Freq: Every day | ORAL | 11 refills | Status: AC
  Filled 2024-11-17 – 2024-11-28 (×2): qty 30, 30d supply, fill #0

## 2024-11-17 NOTE — Assessment & Plan Note (Addendum)
 The patient have longstanding history of recurrent DVT, first blood clot was diagnosed in 2008, was smoking at the time.  She also had imaging study which show evidence of pulmonary emboli and she was appropriately anticoagulated for 2 years She developed recurrent DVT again in 2014 and had recurrent DVT She remained on long-term Xarelto  because of history of recurrent DVT She is doing with very well on long term Xarelto . She has no complications from treatment. Goal of treatment is lifelong. I will see her once a year for toxicity review and medication refills She is educated to watch for signs and symptoms of bleeding

## 2024-11-17 NOTE — Progress Notes (Signed)
 [facility] OFFICE PROGRESS NOTE  Patient Care Team: Vicci Barnie NOVAK, MD as PCP - General (Internal Medicine) Lonni Slain, MD as PCP - Cardiology (Cardiology) Lonn Hicks, MD as Consulting Physician (Hematology and Oncology) Tobie Tonita POUR, DO as Consulting Physician (Neurology)  Assessment & Plan Chronic deep vein thrombosis (DVT) of proximal vein of lower extremity, unspecified laterality [facility]) The patient have longstanding history of recurrent DVT, first blood clot was diagnosed in 2008, was smoking at the time.  She also had imaging study which show evidence of pulmonary emboli and she was appropriately anticoagulated for 2 years She developed recurrent DVT again in 2014 and had recurrent DVT She remained on long-term Xarelto  because of history of recurrent DVT She is doing with very well on long term Xarelto . She has no complications from treatment. Goal of treatment is lifelong. I will see her once a year for toxicity review and medication refills She is educated to watch for signs and symptoms of bleeding  Orders Placed This Encounter  Procedures   Basic Metabolic Panel - Cancer Center Only    Standing Status:   Future    Expiration Date:   11/17/2025     Hicks Lonn, MD  INTERVAL HISTORY: she returns for surveillance follow-up for history of DVT/PE Patient denies recent bleeding such as epistaxis, hematuria or hematochezia We reviewed medication list and discussed medication changes We discussed test results and future plan of care as outlined above  PHYSICAL EXAMINATION: ECOG PERFORMANCE STATUS: 0 - Asymptomatic  There were no vitals filed for this visit. Lab Results  Component Value Date   WBC 8.0 11/17/2024   HGB 12.9 11/17/2024   HCT 39.8 11/17/2024   MCV 92.6 11/17/2024   PLT 355 11/17/2024    SUMMARY OF HEMATOLOGIC HISTORY:  She is being referred because of recurrent DVT. According to the patient, she was first diagnosed with  blood clot in 2008. According to the patient it was unprovoked. The patient complained of shortness of breath and cough and chest discomfort. She was smoking at that time. She had imaging study of the lung done which show evidence of PE. According to the patient she was on a blood thinner for 2 years. The date of the CT scan was 06/24/2007. In 2014, she started to complain of intermittent sharp discomfort on the right leg associated with some mild swelling. She states that she had pain on the left leg as well but is not as severe compared to the right leg. She went to the emergency department. Results of the ultrasound venous Doppler came back consistent with acute deep vein thrombosis involving the posterior tibial and perioneal vein of the right lower extremity. D-dimer was elevated. She was placed on Lovenox  and transition to warfarin 5 mg daily. Her INR has been supratherapeutic requiring vitamin K  and multiple dose interruption to keep her INR at the desire range at 2-3. She denies any bleeding complications such as spontaneous epistaxis, hematuria, hematochezia, or the need for blood transfusions. She had mild intermittent chest discomfort throughout this and had imaging study done which show possible new clot in the left lung on the CT scan from September 2014. She has been pregnant 4 times including C-section for her pregnancy and never developed any peripartum blood clots. There were no family history of blood clots. In October 2014, I made a decision to change her to Xarelto .  Duration of treatment is lifelong

## 2024-11-28 ENCOUNTER — Other Ambulatory Visit: Payer: Self-pay | Admitting: Internal Medicine

## 2024-11-28 ENCOUNTER — Other Ambulatory Visit (HOSPITAL_COMMUNITY): Payer: Self-pay

## 2024-11-28 DIAGNOSIS — I10 Essential (primary) hypertension: Secondary | ICD-10-CM

## 2024-11-28 DIAGNOSIS — E782 Mixed hyperlipidemia: Secondary | ICD-10-CM

## 2024-11-29 ENCOUNTER — Other Ambulatory Visit (HOSPITAL_COMMUNITY): Payer: Self-pay

## 2024-11-29 ENCOUNTER — Telehealth: Admitting: Neurology

## 2024-11-29 ENCOUNTER — Other Ambulatory Visit: Payer: Self-pay

## 2024-11-29 DIAGNOSIS — G6181 Chronic inflammatory demyelinating polyneuritis: Secondary | ICD-10-CM | POA: Diagnosis not present

## 2024-11-29 NOTE — Progress Notes (Signed)
 Message sent to Chad to follow up on this.

## 2024-11-29 NOTE — Progress Notes (Signed)
 Per Chad:  Hi Koralynn Greenspan,   It does look like we attempted to call her last week but she had finally returned our call yesterday about the RX change.  The PN should be calling her today to schedule the delivery.

## 2024-11-29 NOTE — Progress Notes (Signed)
" ° °  Virtual Visit via Video Note The purpose of this virtual visit is to provide medical care while limiting exposure to the novel coronavirus.    Consent was obtained for video visit:  Yes.   Answered questions that patient had about telehealth interaction:  Yes.   I discussed the limitations, risks, security and privacy concerns of performing an evaluation and management service by telemedicine. I also discussed with the patient that there may be a patient responsible charge related to this service. The patient expressed understanding and agreed to proceed.  Pt location: Home Physician Location: office Name of referring provider:  Vicci Barnie NOVAK, MD I connected with Jon LITTIE Daring at patients initiation/request on 11/29/2024 at  9:30 AM EST by video enabled telemedicine application and verified that I am speaking with the correct person using two identifiers. Pt MRN:  [id number] Pt DOB:  02/19/1956 Video Participants:  Jon LITTIE Daring   History of Present Illness: This is a 69 y.o. female returning for follow-up of CIDP.  She has been having increased shooting pain in the hands.  Due to insurance delays, her IVIG has been delayed and she is awaiting approval and scheduling of home infusion.  She remains on gabapentin  600mg  BID and duloxetine  30mg  twice daily.  No recent falls.  She does have weakness in the hands and some imbalance.    Assessment and Plan:  Chronic inflammatory demyelinating polyradiculoneuropathy (diagnosed 2018) presenting with paresthesia and weakness in the upper extremities, supported by EMG and CSF.  IVIG started in 07/2018 every 21 days, which has stabilized symptoms.  Repeat EMG from 2023 shows progressive neuropathy, and therefore it was decided to continue IVIG every 4 weeks.  She is having worsening paresthesia, some of which may be due to the fact that she is late in getting IVIG.  I will follow-up with home infusion company regarding her next IVIG and if she  continues to have pain, despite starting IVIG, her frequency may be increased to every 21 days.  For pain, continue gabapentin  600mg  BID and duloxetine  30mg  BID.  Caution with titration due to CKD.   Follow Up Instructions:   I discussed the assessment and treatment plan with the patient. The patient was provided an opportunity to ask questions and all were answered. The patient agreed with the plan and demonstrated an understanding of the instructions.   The patient was advised to call back or seek an in-person evaluation if the symptoms worsen or if the condition fails to improve as anticipated.  Follow-up in 6 months   Tonita MARLA Blanch, DO  "

## 2024-11-30 ENCOUNTER — Other Ambulatory Visit (HOSPITAL_COMMUNITY): Payer: Self-pay

## 2024-11-30 ENCOUNTER — Other Ambulatory Visit: Payer: Self-pay

## 2024-11-30 MED ORDER — AMLODIPINE BESYLATE 5 MG PO TABS
5.0000 mg | ORAL_TABLET | Freq: Every day | ORAL | 0 refills | Status: AC
  Filled 2024-11-30: qty 30, 30d supply, fill #0

## 2024-11-30 MED ORDER — ATORVASTATIN CALCIUM 40 MG PO TABS
40.0000 mg | ORAL_TABLET | Freq: Every day | ORAL | 0 refills | Status: AC
  Filled 2024-11-30: qty 30, 30d supply, fill #0

## 2024-12-13 ENCOUNTER — Ambulatory Visit: Payer: 59 | Admitting: Neurology

## 2024-12-15 ENCOUNTER — Ambulatory Visit: Payer: Self-pay | Admitting: Internal Medicine

## 2025-05-30 ENCOUNTER — Ambulatory Visit: Payer: Self-pay | Admitting: Neurology

## 2025-11-17 ENCOUNTER — Inpatient Hospital Stay: Admitting: Hematology and Oncology

## 2025-11-17 ENCOUNTER — Inpatient Hospital Stay
